# Patient Record
Sex: Male | Born: 1980 | Race: White | Hispanic: No | Marital: Single | Smoking: Never smoker
Health system: Southern US, Community
[De-identification: ages and names within clinical notes are randomized; demographics above are authoritative.]

## PROBLEM LIST (undated history)

## (undated) DIAGNOSIS — I499 Cardiac arrhythmia, unspecified: Secondary | ICD-10-CM

## (undated) DIAGNOSIS — F99 Mental disorder, not otherwise specified: Secondary | ICD-10-CM

## (undated) DIAGNOSIS — F32A Depression, unspecified: Secondary | ICD-10-CM

## (undated) DIAGNOSIS — F319 Bipolar disorder, unspecified: Secondary | ICD-10-CM

## (undated) DIAGNOSIS — F329 Major depressive disorder, single episode, unspecified: Secondary | ICD-10-CM

## (undated) DIAGNOSIS — J45909 Unspecified asthma, uncomplicated: Secondary | ICD-10-CM

## (undated) DIAGNOSIS — I10 Essential (primary) hypertension: Secondary | ICD-10-CM

## (undated) DIAGNOSIS — F419 Anxiety disorder, unspecified: Secondary | ICD-10-CM

## (undated) DIAGNOSIS — G8929 Other chronic pain: Secondary | ICD-10-CM

## (undated) HISTORY — DX: Anxiety disorder, unspecified: F41.9

## (undated) HISTORY — DX: Depression, unspecified: F32.A

## (undated) HISTORY — DX: Other chronic pain: G89.29

---

## 2006-05-02 ENCOUNTER — Emergency Department (HOSPITAL_COMMUNITY): Admission: EM | Admit: 2006-05-02 | Discharge: 2006-05-02 | Payer: Self-pay | Admitting: Emergency Medicine

## 2008-02-20 ENCOUNTER — Emergency Department (HOSPITAL_COMMUNITY): Admission: EM | Admit: 2008-02-20 | Discharge: 2008-02-20 | Payer: Self-pay | Admitting: Family Medicine

## 2010-10-17 ENCOUNTER — Emergency Department (HOSPITAL_COMMUNITY)
Admission: EM | Admit: 2010-10-17 | Discharge: 2010-10-17 | Disposition: A | Discharge status: Home / Self Care | Payer: Self-pay | Attending: Emergency Medicine | Admitting: Emergency Medicine

## 2010-10-17 DIAGNOSIS — M545 Low back pain, unspecified: Secondary | ICD-10-CM | POA: Insufficient documentation

## 2010-10-17 DIAGNOSIS — Y9367 Activity, basketball: Secondary | ICD-10-CM | POA: Insufficient documentation

## 2010-10-17 DIAGNOSIS — X500XXA Overexertion from strenuous movement or load, initial encounter: Secondary | ICD-10-CM | POA: Insufficient documentation

## 2010-10-17 DIAGNOSIS — M25659 Stiffness of unspecified hip, not elsewhere classified: Secondary | ICD-10-CM | POA: Insufficient documentation

## 2010-10-17 DIAGNOSIS — S335XXA Sprain of ligaments of lumbar spine, initial encounter: Secondary | ICD-10-CM | POA: Insufficient documentation

## 2010-10-28 ENCOUNTER — Emergency Department (HOSPITAL_COMMUNITY)
Admission: EM | Admit: 2010-10-28 | Discharge: 2010-10-28 | Disposition: A | Discharge status: Home / Self Care | Payer: Self-pay | Attending: Emergency Medicine | Admitting: Emergency Medicine

## 2010-10-28 ENCOUNTER — Emergency Department (HOSPITAL_COMMUNITY): Payer: Self-pay

## 2010-10-28 DIAGNOSIS — R509 Fever, unspecified: Secondary | ICD-10-CM | POA: Insufficient documentation

## 2010-10-28 DIAGNOSIS — G8929 Other chronic pain: Secondary | ICD-10-CM | POA: Insufficient documentation

## 2010-10-28 DIAGNOSIS — M25539 Pain in unspecified wrist: Secondary | ICD-10-CM | POA: Insufficient documentation

## 2010-10-28 DIAGNOSIS — W19XXXA Unspecified fall, initial encounter: Secondary | ICD-10-CM | POA: Insufficient documentation

## 2010-10-28 DIAGNOSIS — M545 Low back pain, unspecified: Secondary | ICD-10-CM | POA: Insufficient documentation

## 2010-10-28 DIAGNOSIS — IMO0002 Reserved for concepts with insufficient information to code with codable children: Secondary | ICD-10-CM | POA: Insufficient documentation

## 2010-10-30 ENCOUNTER — Emergency Department (HOSPITAL_COMMUNITY)
Admission: EM | Admit: 2010-10-30 | Discharge: 2010-10-31 | Disposition: A | Discharge status: Other Acute Inpatient Hospital | Payer: Self-pay | Attending: Emergency Medicine | Admitting: Emergency Medicine

## 2010-10-30 DIAGNOSIS — F3289 Other specified depressive episodes: Secondary | ICD-10-CM | POA: Insufficient documentation

## 2010-10-30 DIAGNOSIS — F329 Major depressive disorder, single episode, unspecified: Secondary | ICD-10-CM | POA: Insufficient documentation

## 2010-10-30 DIAGNOSIS — F411 Generalized anxiety disorder: Secondary | ICD-10-CM | POA: Insufficient documentation

## 2010-10-31 ENCOUNTER — Emergency Department (HOSPITAL_COMMUNITY)
Admission: EM | Admit: 2010-10-31 | Discharge: 2010-11-01 | Disposition: A | Discharge status: Other Acute Inpatient Hospital | Payer: Self-pay | Attending: Emergency Medicine | Admitting: Emergency Medicine

## 2010-10-31 DIAGNOSIS — F329 Major depressive disorder, single episode, unspecified: Secondary | ICD-10-CM | POA: Insufficient documentation

## 2010-10-31 DIAGNOSIS — F411 Generalized anxiety disorder: Secondary | ICD-10-CM | POA: Insufficient documentation

## 2010-10-31 DIAGNOSIS — F3289 Other specified depressive episodes: Secondary | ICD-10-CM | POA: Insufficient documentation

## 2010-10-31 LAB — CBC
Platelets: 308 10*3/uL (ref 150–400)
RBC: 5.02 MIL/uL (ref 4.22–5.81)
WBC: 11.7 10*3/uL — ABNORMAL HIGH (ref 4.0–10.5)

## 2010-10-31 LAB — COMPREHENSIVE METABOLIC PANEL
AST: 16 U/L (ref 0–37)
Albumin: 4.3 g/dL (ref 3.5–5.2)
Chloride: 105 mEq/L (ref 96–112)
Creatinine, Ser: 0.74 mg/dL (ref 0.4–1.5)
GFR calc Af Amer: >60 mL/min (ref >60)
Potassium: 4 mEq/L (ref 3.5–5.1)
Total Bilirubin: 0.7 mg/dL (ref 0.3–1.2)
Total Protein: 7 g/dL (ref 6.0–8.3)

## 2010-10-31 LAB — RAPID URINE DRUG SCREEN, HOSP PERFORMED
Amphetamines: NOT DETECTED
Benzodiazepines: POSITIVE — AB
Opiates: POSITIVE — AB
Tetrahydrocannabinol: NOT DETECTED

## 2010-10-31 LAB — DIFFERENTIAL
Basophils Relative: 0 % (ref 0–1)
Eosinophils Absolute: 0 10*3/uL (ref 0.0–0.7)
Neutrophils Relative %: 80 % — ABNORMAL HIGH (ref 43–77)

## 2010-11-01 ENCOUNTER — Inpatient Hospital Stay (HOSPITAL_COMMUNITY)
Admission: AD | Admit: 2010-11-01 | Discharge: 2010-11-18 | DRG: 885 | Disposition: A | Discharge status: Home / Self Care | Payer: PRIVATE HEALTH INSURANCE | Source: Ambulatory Visit | Attending: Psychiatry | Admitting: Psychiatry

## 2010-11-01 DIAGNOSIS — R45851 Suicidal ideations: Secondary | ICD-10-CM

## 2010-11-01 DIAGNOSIS — Z733 Stress, not elsewhere classified: Secondary | ICD-10-CM

## 2010-11-01 DIAGNOSIS — X58XXXA Exposure to other specified factors, initial encounter: Secondary | ICD-10-CM

## 2010-11-01 DIAGNOSIS — Z560 Unemployment, unspecified: Secondary | ICD-10-CM

## 2010-11-01 DIAGNOSIS — F331 Major depressive disorder, recurrent, moderate: Secondary | ICD-10-CM

## 2010-11-01 DIAGNOSIS — F329 Major depressive disorder, single episode, unspecified: Secondary | ICD-10-CM

## 2010-11-01 DIAGNOSIS — Z818 Family history of other mental and behavioral disorders: Secondary | ICD-10-CM

## 2010-11-01 DIAGNOSIS — S63509A Unspecified sprain of unspecified wrist, initial encounter: Secondary | ICD-10-CM

## 2010-11-01 DIAGNOSIS — M549 Dorsalgia, unspecified: Secondary | ICD-10-CM

## 2010-11-01 DIAGNOSIS — G8929 Other chronic pain: Secondary | ICD-10-CM

## 2010-11-01 DIAGNOSIS — F411 Generalized anxiety disorder: Secondary | ICD-10-CM

## 2010-11-04 DIAGNOSIS — F339 Major depressive disorder, recurrent, unspecified: Secondary | ICD-10-CM

## 2010-11-09 LAB — DIFFERENTIAL
Basophils Absolute: 0 10*3/uL (ref 0.0–0.1)
Basophils Relative: 0 % (ref 0–1)
Eosinophils Absolute: 0.2 10*3/uL (ref 0.0–0.7)
Eosinophils Relative: 2 % (ref 0–5)
Monocytes Absolute: 0.8 10*3/uL (ref 0.1–1.0)
Neutro Abs: 5.8 10*3/uL (ref 1.7–7.7)

## 2010-11-09 LAB — GLUCOSE, CAPILLARY: Glucose-Capillary: 104 mg/dL — ABNORMAL HIGH (ref 70–99)

## 2010-11-09 LAB — CBC
Hemoglobin: 14.8 g/dL (ref 13.0–17.0)
MCH: 29.7 pg (ref 26.0–34.0)
MCHC: 33.1 g/dL (ref 30.0–36.0)
Platelets: 262 10*3/uL (ref 150–400)
RDW: 12.4 % (ref 11.5–15.5)

## 2010-11-11 LAB — GLUCOSE, CAPILLARY: Glucose-Capillary: 106 mg/dL — ABNORMAL HIGH (ref 70–99)

## 2010-11-12 LAB — GLUCOSE, CAPILLARY
Glucose-Capillary: 84 mg/dL (ref 70–99)
Glucose-Capillary: 83 mg/dL (ref 70–99)

## 2010-11-13 LAB — GLUCOSE, CAPILLARY: Glucose-Capillary: 100 mg/dL — ABNORMAL HIGH (ref 70–99)

## 2010-11-19 ENCOUNTER — Emergency Department (HOSPITAL_COMMUNITY)
Admission: EM | Admit: 2010-11-19 | Discharge: 2010-11-22 | Disposition: A | Discharge status: Other Acute Inpatient Hospital | Payer: Self-pay | Attending: Emergency Medicine | Admitting: Emergency Medicine

## 2010-11-19 DIAGNOSIS — F3112 Bipolar disorder, current episode manic without psychotic features, moderate: Secondary | ICD-10-CM

## 2010-11-19 DIAGNOSIS — R45851 Suicidal ideations: Secondary | ICD-10-CM | POA: Insufficient documentation

## 2010-11-19 LAB — COMPREHENSIVE METABOLIC PANEL
Albumin: 4.2 g/dL (ref 3.5–5.2)
Alkaline Phosphatase: 110 U/L (ref 39–117)
BUN: 16 mg/dL (ref 6–23)
Calcium: 10 mg/dL (ref 8.4–10.5)
GFR calc Af Amer: >60 mL/min (ref >60)
Glucose, Bld: 81 mg/dL (ref 70–99)
Potassium: 3.9 mEq/L (ref 3.5–5.1)
Sodium: 139 mEq/L (ref 135–145)
Total Protein: 7.2 g/dL (ref 6.0–8.3)

## 2010-11-19 LAB — DIFFERENTIAL
Basophils Absolute: 0 10*3/uL (ref 0.0–0.1)
Basophils Relative: 0 % (ref 0–1)
Eosinophils Relative: 3 % (ref 0–5)
Monocytes Absolute: 0.7 10*3/uL (ref 0.1–1.0)
Monocytes Relative: 9 % (ref 3–12)

## 2010-11-19 LAB — CBC
Hemoglobin: 14.7 g/dL (ref 13.0–17.0)
MCH: 28.2 pg (ref 26.0–34.0)
MCHC: 31.7 g/dL (ref 30.0–36.0)
RDW: 12.6 % (ref 11.5–15.5)

## 2010-11-19 LAB — RAPID URINE DRUG SCREEN, HOSP PERFORMED
Barbiturates: NOT DETECTED
Tetrahydrocannabinol: NOT DETECTED

## 2010-11-19 LAB — ETHANOL: Alcohol, Ethyl (B): <11 mg/dL (ref 0–11)

## 2010-11-20 DIAGNOSIS — F3112 Bipolar disorder, current episode manic without psychotic features, moderate: Secondary | ICD-10-CM

## 2010-11-21 DIAGNOSIS — F3112 Bipolar disorder, current episode manic without psychotic features, moderate: Secondary | ICD-10-CM

## 2010-11-22 ENCOUNTER — Inpatient Hospital Stay (HOSPITAL_COMMUNITY)
Admission: AD | Admit: 2010-11-22 | Discharge: 2010-11-29 | DRG: 885 | Disposition: A | Discharge status: Home / Self Care | Payer: PRIVATE HEALTH INSURANCE | Source: Ambulatory Visit | Attending: Psychiatry | Admitting: Psychiatry

## 2010-11-22 DIAGNOSIS — R45851 Suicidal ideations: Secondary | ICD-10-CM

## 2010-11-22 DIAGNOSIS — G8929 Other chronic pain: Secondary | ICD-10-CM

## 2010-11-22 DIAGNOSIS — F333 Major depressive disorder, recurrent, severe with psychotic symptoms: Principal | ICD-10-CM

## 2010-11-22 DIAGNOSIS — Z560 Unemployment, unspecified: Secondary | ICD-10-CM

## 2010-11-22 DIAGNOSIS — M549 Dorsalgia, unspecified: Secondary | ICD-10-CM
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
## 2010-12-04 ENCOUNTER — Emergency Department (HOSPITAL_COMMUNITY)
Admission: EM | Admit: 2010-12-04 | Discharge: 2010-12-04 | Disposition: A | Discharge status: Other Acute Inpatient Hospital | Payer: Self-pay | Attending: Emergency Medicine | Admitting: Emergency Medicine

## 2010-12-04 DIAGNOSIS — F3289 Other specified depressive episodes: Secondary | ICD-10-CM | POA: Insufficient documentation

## 2010-12-04 DIAGNOSIS — R45851 Suicidal ideations: Secondary | ICD-10-CM | POA: Insufficient documentation

## 2010-12-04 DIAGNOSIS — H519 Unspecified disorder of binocular movement: Secondary | ICD-10-CM | POA: Insufficient documentation

## 2010-12-04 DIAGNOSIS — G8929 Other chronic pain: Secondary | ICD-10-CM | POA: Insufficient documentation

## 2010-12-04 DIAGNOSIS — M549 Dorsalgia, unspecified: Secondary | ICD-10-CM | POA: Insufficient documentation

## 2010-12-04 DIAGNOSIS — F329 Major depressive disorder, single episode, unspecified: Secondary | ICD-10-CM | POA: Insufficient documentation

## 2010-12-04 LAB — DIFFERENTIAL
Basophils Absolute: 0 10*3/uL (ref 0.0–0.1)
Eosinophils Relative: 1 % (ref 0–5)
Lymphocytes Relative: 18 % (ref 12–46)
Neutro Abs: 7 10*3/uL (ref 1.7–7.7)
Neutrophils Relative %: 73 % (ref 43–77)

## 2010-12-04 LAB — CBC
HCT: 45.2 % (ref 39.0–52.0)
Hemoglobin: 15.5 g/dL (ref 13.0–17.0)
RBC: 5.14 MIL/uL (ref 4.22–5.81)
RDW: 12.2 % (ref 11.5–15.5)
WBC: 9.6 10*3/uL (ref 4.0–10.5)

## 2010-12-04 LAB — BASIC METABOLIC PANEL
Chloride: 106 mEq/L (ref 96–112)
GFR calc non Af Amer: >60 mL/min (ref >60)
Glucose, Bld: 92 mg/dL (ref 70–99)
Potassium: 4.1 mEq/L (ref 3.5–5.1)
Sodium: 143 mEq/L (ref 135–145)

## 2010-12-04 LAB — RAPID URINE DRUG SCREEN, HOSP PERFORMED
Amphetamines: NOT DETECTED
Barbiturates: NOT DETECTED
Benzodiazepines: NOT DETECTED
Cocaine: NOT DETECTED
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
## 2010-12-11 ENCOUNTER — Emergency Department (HOSPITAL_COMMUNITY)
Admission: EM | Admit: 2010-12-11 | Discharge: 2010-12-15 | Disposition: A | Discharge status: Home / Self Care | Payer: Self-pay | Attending: Emergency Medicine | Admitting: Emergency Medicine

## 2010-12-11 DIAGNOSIS — F3289 Other specified depressive episodes: Secondary | ICD-10-CM | POA: Insufficient documentation

## 2010-12-11 DIAGNOSIS — F411 Generalized anxiety disorder: Secondary | ICD-10-CM | POA: Insufficient documentation

## 2010-12-11 DIAGNOSIS — F329 Major depressive disorder, single episode, unspecified: Secondary | ICD-10-CM | POA: Insufficient documentation

## 2010-12-11 DIAGNOSIS — Z79899 Other long term (current) drug therapy: Secondary | ICD-10-CM | POA: Insufficient documentation

## 2010-12-11 LAB — COMPREHENSIVE METABOLIC PANEL
AST: 21 U/L (ref 0–37)
Albumin: 4 g/dL (ref 3.5–5.2)
Calcium: 9.5 mg/dL (ref 8.4–10.5)
Creatinine, Ser: 0.81 mg/dL (ref 0.50–1.35)
Total Protein: 7.2 g/dL (ref 6.0–8.3)

## 2010-12-11 LAB — DIFFERENTIAL
Eosinophils Absolute: 0.3 10*3/uL (ref 0.0–0.7)
Eosinophils Relative: 3 % (ref 0–5)
Lymphs Abs: 2.1 10*3/uL (ref 0.7–4.0)
Monocytes Relative: 8 % (ref 3–12)

## 2010-12-11 LAB — RAPID URINE DRUG SCREEN, HOSP PERFORMED
Amphetamines: NOT DETECTED
Cocaine: NOT DETECTED
Opiates: NOT DETECTED
Tetrahydrocannabinol: NOT DETECTED

## 2010-12-11 LAB — CBC
MCH: 30.6 pg (ref 26.0–34.0)
MCV: 87.4 fL (ref 78.0–100.0)
Platelets: 270 10*3/uL (ref 150–400)
RDW: 12.3 % (ref 11.5–15.5)
WBC: 8.6 10*3/uL (ref 4.0–10.5)
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
## 2010-12-23 ENCOUNTER — Emergency Department (HOSPITAL_COMMUNITY)
Admission: EM | Admit: 2010-12-23 | Discharge: 2010-12-23 | Disposition: A | Discharge status: Home / Self Care | Payer: Self-pay | Attending: Emergency Medicine | Admitting: Emergency Medicine

## 2010-12-23 ENCOUNTER — Emergency Department (HOSPITAL_COMMUNITY)
Admission: EM | Admit: 2010-12-23 | Discharge: 2011-01-03 | Disposition: A | Discharge status: Home / Self Care | Payer: Self-pay | Attending: Emergency Medicine | Admitting: Emergency Medicine

## 2010-12-23 DIAGNOSIS — F329 Major depressive disorder, single episode, unspecified: Secondary | ICD-10-CM | POA: Insufficient documentation

## 2010-12-23 DIAGNOSIS — S59909A Unspecified injury of unspecified elbow, initial encounter: Secondary | ICD-10-CM | POA: Insufficient documentation

## 2010-12-23 DIAGNOSIS — M25539 Pain in unspecified wrist: Secondary | ICD-10-CM | POA: Insufficient documentation

## 2010-12-23 DIAGNOSIS — F3289 Other specified depressive episodes: Secondary | ICD-10-CM | POA: Insufficient documentation

## 2010-12-23 DIAGNOSIS — W19XXXA Unspecified fall, initial encounter: Secondary | ICD-10-CM | POA: Insufficient documentation

## 2010-12-23 DIAGNOSIS — S59919A Unspecified injury of unspecified forearm, initial encounter: Secondary | ICD-10-CM | POA: Insufficient documentation

## 2010-12-23 DIAGNOSIS — S6990XA Unspecified injury of unspecified wrist, hand and finger(s), initial encounter: Secondary | ICD-10-CM | POA: Insufficient documentation

## 2010-12-23 DIAGNOSIS — R45851 Suicidal ideations: Secondary | ICD-10-CM | POA: Insufficient documentation

## 2010-12-23 DIAGNOSIS — Z79899 Other long term (current) drug therapy: Secondary | ICD-10-CM | POA: Insufficient documentation

## 2010-12-23 DIAGNOSIS — M79609 Pain in unspecified limb: Secondary | ICD-10-CM | POA: Insufficient documentation

## 2010-12-23 LAB — CBC
HCT: 43.3 % (ref 39.0–52.0)
MCH: 29.7 pg (ref 26.0–34.0)
MCHC: 34.2 g/dL (ref 30.0–36.0)
MCHC: 33.3 g/dL (ref 30.0–36.0)
MCV: 89.2 fL (ref 78.0–100.0)
Platelets: 261 10*3/uL (ref 150–400)
RDW: 12.4 % (ref 11.5–15.5)

## 2010-12-23 LAB — BASIC METABOLIC PANEL
CO2: 28 mEq/L (ref 19–32)
GFR calc non Af Amer: >60 mL/min (ref >60)
Glucose, Bld: 89 mg/dL (ref 70–99)
Potassium: 3.8 mEq/L (ref 3.5–5.1)
Sodium: 143 mEq/L (ref 135–145)

## 2010-12-23 LAB — ETHANOL
Alcohol, Ethyl (B): <11 mg/dL (ref 0–11)
Alcohol, Ethyl (B): <11 mg/dL (ref 0–11)

## 2010-12-23 LAB — COMPREHENSIVE METABOLIC PANEL
Albumin: 4.3 g/dL (ref 3.5–5.2)
Alkaline Phosphatase: 112 U/L (ref 39–117)
BUN: 11 mg/dL (ref 6–23)
Calcium: 9.5 mg/dL (ref 8.4–10.5)
Potassium: 3.9 mEq/L (ref 3.5–5.1)
Total Protein: 7.3 g/dL (ref 6.0–8.3)

## 2010-12-23 LAB — DIFFERENTIAL
Basophils Absolute: 0 10*3/uL (ref 0.0–0.1)
Basophils Relative: 0 % (ref 0–1)
Basophils Relative: 0 % (ref 0–1)
Eosinophils Absolute: 0.4 10*3/uL (ref 0.0–0.7)
Eosinophils Relative: 6 % — ABNORMAL HIGH (ref 0–5)
Eosinophils Relative: 5 % (ref 0–5)
Lymphocytes Relative: 26 % (ref 12–46)
Monocytes Absolute: 0.6 10*3/uL (ref 0.1–1.0)
Monocytes Absolute: 0.7 10*3/uL (ref 0.1–1.0)
Monocytes Relative: 8 % (ref 3–12)
Neutrophils Relative %: 57 % (ref 43–77)

## 2010-12-23 LAB — RAPID URINE DRUG SCREEN, HOSP PERFORMED
Amphetamines: NOT DETECTED
Benzodiazepines: NOT DETECTED
Cocaine: NOT DETECTED
Opiates: NOT DETECTED
Opiates: NOT DETECTED

## 2010-12-29 ENCOUNTER — Emergency Department (HOSPITAL_COMMUNITY): Payer: Self-pay

## 2011-01-16 ENCOUNTER — Emergency Department (HOSPITAL_COMMUNITY)
Admission: EM | Admit: 2011-01-16 | Discharge: 2011-01-21 | Disposition: A | Discharge status: Home / Self Care | Payer: Self-pay | Attending: Emergency Medicine | Admitting: Emergency Medicine

## 2011-01-16 DIAGNOSIS — F329 Major depressive disorder, single episode, unspecified: Secondary | ICD-10-CM | POA: Insufficient documentation

## 2011-01-16 DIAGNOSIS — IMO0002 Reserved for concepts with insufficient information to code with codable children: Secondary | ICD-10-CM | POA: Insufficient documentation

## 2011-01-16 DIAGNOSIS — Z79899 Other long term (current) drug therapy: Secondary | ICD-10-CM | POA: Insufficient documentation

## 2011-01-16 DIAGNOSIS — F3289 Other specified depressive episodes: Secondary | ICD-10-CM | POA: Insufficient documentation

## 2011-01-16 DIAGNOSIS — X789XXA Intentional self-harm by unspecified sharp object, initial encounter: Secondary | ICD-10-CM | POA: Insufficient documentation

## 2011-01-16 LAB — DIFFERENTIAL
Basophils Absolute: 0 K/uL (ref 0.0–0.1)
Basophils Relative: 1 % (ref 0–1)
Eosinophils Absolute: 0.3 K/uL (ref 0.0–0.7)
Eosinophils Relative: 5 % (ref 0–5)
Lymphocytes Relative: 25 % (ref 12–46)
Lymphs Abs: 1.4 K/uL (ref 0.7–4.0)
Monocytes Absolute: 0.5 K/uL (ref 0.1–1.0)
Monocytes Relative: 9 % (ref 3–12)
Neutro Abs: 3.5 K/uL (ref 1.7–7.7)
Neutrophils Relative %: 62 % (ref 43–77)

## 2011-01-16 LAB — CBC
HCT: 42.3 % (ref 39.0–52.0)
Hemoglobin: 13.6 g/dL (ref 13.0–17.0)
MCH: 28.6 pg (ref 26.0–34.0)
MCHC: 32.2 g/dL (ref 30.0–36.0)
MCV: 89.1 fL (ref 78.0–100.0)
Platelets: 273 K/uL (ref 150–400)
RBC: 4.75 MIL/uL (ref 4.22–5.81)
RDW: 12.9 % (ref 11.5–15.5)
WBC: 5.7 K/uL (ref 4.0–10.5)

## 2011-01-16 LAB — COMPREHENSIVE METABOLIC PANEL
ALT: 31 U/L (ref 0–53)
AST: 20 U/L (ref 0–37)
Albumin: 4.2 g/dL (ref 3.5–5.2)
Alkaline Phosphatase: 102 U/L (ref 39–117)
Potassium: 4 mEq/L (ref 3.5–5.1)
Sodium: 140 mEq/L (ref 135–145)
Total Protein: 7.4 g/dL (ref 6.0–8.3)

## 2011-01-16 LAB — ETHANOL: Alcohol, Ethyl (B): <11 mg/dL (ref 0–11)

## 2011-01-16 LAB — RAPID URINE DRUG SCREEN, HOSP PERFORMED
Barbiturates: NOT DETECTED
Tetrahydrocannabinol: NOT DETECTED

## 2011-01-16 LAB — ACETAMINOPHEN LEVEL: Acetaminophen (Tylenol), Serum: <15 ug/mL (ref 10–30)

## 2011-02-10 ENCOUNTER — Emergency Department (HOSPITAL_COMMUNITY): Payer: Self-pay

## 2011-02-10 ENCOUNTER — Emergency Department (HOSPITAL_COMMUNITY)
Admission: EM | Admit: 2011-02-10 | Discharge: 2011-02-10 | Disposition: A | Discharge status: Home / Self Care | Payer: Self-pay | Attending: Emergency Medicine | Admitting: Emergency Medicine

## 2011-02-10 ENCOUNTER — Emergency Department (HOSPITAL_COMMUNITY)
Admission: EM | Admit: 2011-02-10 | Discharge: 2011-02-11 | Disposition: A | Discharge status: Home / Self Care | Payer: Self-pay | Attending: Emergency Medicine | Admitting: Emergency Medicine

## 2011-02-10 DIAGNOSIS — M25579 Pain in unspecified ankle and joints of unspecified foot: Secondary | ICD-10-CM | POA: Insufficient documentation

## 2011-02-10 DIAGNOSIS — R079 Chest pain, unspecified: Secondary | ICD-10-CM | POA: Insufficient documentation

## 2011-02-10 DIAGNOSIS — M545 Low back pain, unspecified: Secondary | ICD-10-CM | POA: Insufficient documentation

## 2011-02-10 DIAGNOSIS — M25476 Effusion, unspecified foot: Secondary | ICD-10-CM | POA: Insufficient documentation

## 2011-02-10 DIAGNOSIS — I10 Essential (primary) hypertension: Secondary | ICD-10-CM | POA: Insufficient documentation

## 2011-02-10 DIAGNOSIS — Z79899 Other long term (current) drug therapy: Secondary | ICD-10-CM | POA: Insufficient documentation

## 2011-02-10 DIAGNOSIS — F3289 Other specified depressive episodes: Secondary | ICD-10-CM | POA: Insufficient documentation

## 2011-02-10 DIAGNOSIS — F329 Major depressive disorder, single episode, unspecified: Secondary | ICD-10-CM | POA: Insufficient documentation

## 2011-02-10 DIAGNOSIS — S335XXA Sprain of ligaments of lumbar spine, initial encounter: Secondary | ICD-10-CM | POA: Insufficient documentation

## 2011-02-10 DIAGNOSIS — M25473 Effusion, unspecified ankle: Secondary | ICD-10-CM | POA: Insufficient documentation

## 2011-02-10 DIAGNOSIS — Y92009 Unspecified place in unspecified non-institutional (private) residence as the place of occurrence of the external cause: Secondary | ICD-10-CM | POA: Insufficient documentation

## 2011-02-10 DIAGNOSIS — S93409A Sprain of unspecified ligament of unspecified ankle, initial encounter: Secondary | ICD-10-CM | POA: Insufficient documentation

## 2011-02-10 DIAGNOSIS — F341 Dysthymic disorder: Secondary | ICD-10-CM | POA: Insufficient documentation

## 2011-02-10 DIAGNOSIS — F319 Bipolar disorder, unspecified: Secondary | ICD-10-CM | POA: Insufficient documentation

## 2011-02-10 DIAGNOSIS — W1789XA Other fall from one level to another, initial encounter: Secondary | ICD-10-CM | POA: Insufficient documentation

## 2011-02-10 LAB — DIFFERENTIAL
Basophils Absolute: 0 10*3/uL (ref 0.0–0.1)
Lymphocytes Relative: 21 % (ref 12–46)
Lymphs Abs: 2.3 10*3/uL (ref 0.7–4.0)
Monocytes Absolute: 1.2 10*3/uL — ABNORMAL HIGH (ref 0.1–1.0)
Monocytes Relative: 11 % (ref 3–12)
Neutro Abs: 7.1 10*3/uL (ref 1.7–7.7)

## 2011-02-10 LAB — COMPREHENSIVE METABOLIC PANEL
AST: 18 U/L (ref 0–37)
Albumin: 4.1 g/dL (ref 3.5–5.2)
Calcium: 9.8 mg/dL (ref 8.4–10.5)
Chloride: 104 mEq/L (ref 96–112)
Creatinine, Ser: 0.89 mg/dL (ref 0.50–1.35)
Total Bilirubin: 0.5 mg/dL (ref 0.3–1.2)

## 2011-02-10 LAB — SALICYLATE LEVEL: Salicylate Lvl: 3 mg/dL (ref 2.8–20.0)

## 2011-02-10 LAB — CBC
HCT: 41.7 % (ref 39.0–52.0)
Hemoglobin: 14 g/dL (ref 13.0–17.0)
MCHC: 33.6 g/dL (ref 30.0–36.0)

## 2011-02-10 LAB — RAPID URINE DRUG SCREEN, HOSP PERFORMED
Barbiturates: NOT DETECTED
Cocaine: NOT DETECTED
Tetrahydrocannabinol: NOT DETECTED

## 2011-02-10 LAB — ACETAMINOPHEN LEVEL: Acetaminophen (Tylenol), Serum: <15 ug/mL (ref 10–30)

## 2011-02-11 ENCOUNTER — Emergency Department (HOSPITAL_COMMUNITY): Payer: Self-pay

## 2011-02-11 ENCOUNTER — Emergency Department (HOSPITAL_COMMUNITY)
Admission: EM | Admit: 2011-02-11 | Discharge: 2011-02-11 | Disposition: A | Discharge status: Home / Self Care | Payer: Self-pay | Attending: Emergency Medicine | Admitting: Emergency Medicine

## 2011-02-11 DIAGNOSIS — F3112 Bipolar disorder, current episode manic without psychotic features, moderate: Secondary | ICD-10-CM

## 2011-02-11 DIAGNOSIS — F319 Bipolar disorder, unspecified: Secondary | ICD-10-CM | POA: Insufficient documentation

## 2011-02-11 DIAGNOSIS — R42 Dizziness and giddiness: Secondary | ICD-10-CM | POA: Insufficient documentation

## 2011-02-11 DIAGNOSIS — K5900 Constipation, unspecified: Secondary | ICD-10-CM | POA: Insufficient documentation

## 2011-02-11 DIAGNOSIS — Z79899 Other long term (current) drug therapy: Secondary | ICD-10-CM | POA: Insufficient documentation

## 2011-02-11 DIAGNOSIS — R1013 Epigastric pain: Secondary | ICD-10-CM | POA: Insufficient documentation

## 2011-02-11 DIAGNOSIS — R112 Nausea with vomiting, unspecified: Secondary | ICD-10-CM | POA: Insufficient documentation

## 2011-02-13 NOTE — Consult Note (Signed)
  [name] [name], [name] [name]           ACCOUNT NO.:  [id number]  MEDICAL RECORD NO.:  [id number]  LOCATION:  [facility]                         FACILITY:  [facility]  PHYSICIAN:  [name], MD DATE OF BIRTH:  [birth year]  DATE OF CONSULTATION: DATE OF DISCHARGE:  02/11/2011                                CONSULTATION   FACILITY:  [name].  HISTORY OF PRESENT ILLNESS:  I saw the patient, and reviewed the clinical assessment and the medical records.  A 30 year old male who is well-known to me, has a number of hospitalization at [facility] and in the ER for the mood disorder.  The patient came to the [facility] ED as he was not able to sleep properly, and took Seroquel 500 instead of 150 mg in an effort to sleep and after that he became sick and came to the ER.  Currently, the patient denies suicidal or homicidal ideations, not hallucinating or delusional, not internally preoccupied, is very easily redirectable, pleasant on approach.  Cognition alert, awake, oriented x3.  Memory immediate, recent, remote, fair.  Attention and concentration good.  Abstraction ability, good.  Insight and judgment intact.  SOCIAL HISTORY:  No substance abuse reported by the patient.  His UDS is negative.  MEDICATIONS:  The patient is currently on: 1. Neurontin 400 t.i.d. 2. Hydroxyzine 50 at bedtime. 3. Seroquel 50 in the morning, 150 at bedtime. 4. Cymbalta 60 mg p.o. daily. The patient feels comfortable with this regime and is going to follow up in [place].  The patient lives with mother.  The patient at this time wants to follow up in the outpatient setting and does not want to be admitted in the hospital.  DIAGNOSES:  AXIS I:  Mood disorder, not otherwise specified.  AXIS II: Deferred.  AXIS III:  Chronic back pain.  AXIS IV:  Chronic mental health issues.  AXIS V:  60.  RECOMMENDATIONS:  The patient can be discharged at this time to follow up in the outpatient setting at [facility].  The  patient does not meet criteria to be admitted under IVC in the hospital.  As the patient wants to discharge and his mental status is within normal limits, and he is motivated to follow up and remain compliant with his medications, the patient can be discharged at this time.     [name], MD     SA/MEDQ  D:  02/11/2011  T:  02/11/2011  Job:  [id number]  Electronically Signed by [name]  on 02/13/2011 01:05:12 PM

## 2011-02-21 ENCOUNTER — Emergency Department (HOSPITAL_COMMUNITY)
Admission: EM | Admit: 2011-02-21 | Discharge: 2011-02-21 | Disposition: A | Discharge status: Home / Self Care | Payer: Self-pay | Attending: Emergency Medicine | Admitting: Emergency Medicine

## 2011-02-21 ENCOUNTER — Emergency Department (HOSPITAL_COMMUNITY)
Admission: EM | Admit: 2011-02-21 | Discharge: 2011-02-22 | Disposition: A | Discharge status: Home / Self Care | Payer: Self-pay | Attending: Emergency Medicine | Admitting: Emergency Medicine

## 2011-02-21 DIAGNOSIS — R42 Dizziness and giddiness: Secondary | ICD-10-CM | POA: Insufficient documentation

## 2011-02-21 DIAGNOSIS — R112 Nausea with vomiting, unspecified: Secondary | ICD-10-CM | POA: Insufficient documentation

## 2011-02-21 DIAGNOSIS — L298 Other pruritus: Secondary | ICD-10-CM | POA: Insufficient documentation

## 2011-02-21 DIAGNOSIS — F329 Major depressive disorder, single episode, unspecified: Secondary | ICD-10-CM | POA: Insufficient documentation

## 2011-02-21 DIAGNOSIS — R21 Rash and other nonspecific skin eruption: Secondary | ICD-10-CM | POA: Insufficient documentation

## 2011-02-21 DIAGNOSIS — R51 Headache: Secondary | ICD-10-CM | POA: Insufficient documentation

## 2011-02-21 DIAGNOSIS — F3289 Other specified depressive episodes: Secondary | ICD-10-CM | POA: Insufficient documentation

## 2011-02-21 DIAGNOSIS — L2989 Other pruritus: Secondary | ICD-10-CM | POA: Insufficient documentation

## 2011-02-21 DIAGNOSIS — Z79899 Other long term (current) drug therapy: Secondary | ICD-10-CM | POA: Insufficient documentation

## 2011-02-21 DIAGNOSIS — F3130 Bipolar disorder, current episode depressed, mild or moderate severity, unspecified: Secondary | ICD-10-CM | POA: Insufficient documentation

## 2011-02-21 DIAGNOSIS — M79609 Pain in unspecified limb: Secondary | ICD-10-CM | POA: Insufficient documentation

## 2011-02-21 DIAGNOSIS — Z760 Encounter for issue of repeat prescription: Secondary | ICD-10-CM | POA: Insufficient documentation

## 2011-02-21 LAB — CBC
MCH: 30.3 pg (ref 26.0–34.0)
MCV: 89.4 fL (ref 78.0–100.0)
Platelets: 291 10*3/uL (ref 150–400)
RDW: 12.9 % (ref 11.5–15.5)

## 2011-02-21 LAB — COMPREHENSIVE METABOLIC PANEL
AST: 22 U/L (ref 0–37)
Albumin: 3.9 g/dL (ref 3.5–5.2)
CO2: 26 mEq/L (ref 19–32)
Calcium: 9.6 mg/dL (ref 8.4–10.5)
Creatinine, Ser: 0.72 mg/dL (ref 0.50–1.35)
GFR calc non Af Amer: >60 mL/min (ref >60)

## 2011-02-21 LAB — DIFFERENTIAL
Eosinophils Absolute: 0.3 10*3/uL (ref 0.0–0.7)
Eosinophils Relative: 4 % (ref 0–5)
Lymphs Abs: 1.9 10*3/uL (ref 0.7–4.0)
Monocytes Absolute: 0.7 10*3/uL (ref 0.1–1.0)
Monocytes Relative: 9 % (ref 3–12)

## 2011-02-21 LAB — ETHANOL: Alcohol, Ethyl (B): <11 mg/dL (ref 0–11)

## 2011-02-21 LAB — RAPID URINE DRUG SCREEN, HOSP PERFORMED
Benzodiazepines: NOT DETECTED
Cocaine: NOT DETECTED

## 2011-03-06 ENCOUNTER — Emergency Department (HOSPITAL_COMMUNITY)
Admission: EM | Admit: 2011-03-06 | Discharge: 2011-03-06 | Disposition: A | Discharge status: Home / Self Care | Payer: Self-pay | Attending: Emergency Medicine | Admitting: Emergency Medicine

## 2011-03-06 ENCOUNTER — Emergency Department (HOSPITAL_COMMUNITY): Payer: Self-pay

## 2011-03-06 DIAGNOSIS — M25539 Pain in unspecified wrist: Secondary | ICD-10-CM | POA: Insufficient documentation

## 2011-03-06 DIAGNOSIS — M25639 Stiffness of unspecified wrist, not elsewhere classified: Secondary | ICD-10-CM | POA: Insufficient documentation

## 2011-03-06 DIAGNOSIS — F3130 Bipolar disorder, current episode depressed, mild or moderate severity, unspecified: Secondary | ICD-10-CM | POA: Insufficient documentation

## 2011-03-06 DIAGNOSIS — Z79899 Other long term (current) drug therapy: Secondary | ICD-10-CM | POA: Insufficient documentation

## 2011-03-11 ENCOUNTER — Emergency Department (HOSPITAL_COMMUNITY)
Admission: EM | Admit: 2011-03-11 | Discharge: 2011-03-11 | Disposition: A | Discharge status: Home / Self Care | Payer: Self-pay | Attending: Emergency Medicine | Admitting: Emergency Medicine

## 2011-03-11 DIAGNOSIS — F319 Bipolar disorder, unspecified: Secondary | ICD-10-CM | POA: Insufficient documentation

## 2011-03-11 DIAGNOSIS — R42 Dizziness and giddiness: Secondary | ICD-10-CM | POA: Insufficient documentation

## 2011-03-11 DIAGNOSIS — R112 Nausea with vomiting, unspecified: Secondary | ICD-10-CM | POA: Insufficient documentation

## 2011-05-22 ENCOUNTER — Emergency Department (HOSPITAL_COMMUNITY)
Admission: EM | Admit: 2011-05-22 | Discharge: 2011-05-24 | Disposition: A | Discharge status: Home / Self Care | Payer: Self-pay | Attending: Emergency Medicine | Admitting: Emergency Medicine

## 2011-05-22 DIAGNOSIS — R45851 Suicidal ideations: Secondary | ICD-10-CM | POA: Insufficient documentation

## 2011-05-22 DIAGNOSIS — F1020 Alcohol dependence, uncomplicated: Secondary | ICD-10-CM | POA: Insufficient documentation

## 2011-05-22 DIAGNOSIS — F329 Major depressive disorder, single episode, unspecified: Secondary | ICD-10-CM

## 2011-05-22 DIAGNOSIS — K2920 Alcoholic gastritis without bleeding: Secondary | ICD-10-CM | POA: Insufficient documentation

## 2011-05-22 DIAGNOSIS — F3289 Other specified depressive episodes: Secondary | ICD-10-CM | POA: Insufficient documentation

## 2011-05-22 HISTORY — DX: Mental disorder, not otherwise specified: F99

## 2011-05-22 LAB — CBC
MCHC: 33.2 g/dL (ref 30.0–36.0)
Platelets: 273 10*3/uL (ref 150–400)
RDW: 12.5 % (ref 11.5–15.5)

## 2011-05-22 LAB — RAPID URINE DRUG SCREEN, HOSP PERFORMED
Amphetamines: NOT DETECTED
Barbiturates: NOT DETECTED
Benzodiazepines: NOT DETECTED

## 2011-05-22 LAB — COMPREHENSIVE METABOLIC PANEL
ALT: 15 U/L (ref 0–53)
Albumin: 3.9 g/dL (ref 3.5–5.2)
Alkaline Phosphatase: 100 U/L (ref 39–117)
Potassium: 3.6 mEq/L (ref 3.5–5.1)
Sodium: 140 mEq/L (ref 135–145)
Total Protein: 7.3 g/dL (ref 6.0–8.3)

## 2011-05-22 LAB — ACETAMINOPHEN LEVEL: Acetaminophen (Tylenol), Serum: <15 ug/mL (ref 10–30)

## 2011-05-22 LAB — ETHANOL: Alcohol, Ethyl (B): <11 mg/dL (ref 0–11)

## 2011-05-22 MED ORDER — GABAPENTIN 600 MG PO TABS
300.0000 mg | ORAL_TABLET | Freq: Three times a day (TID) | ORAL | Status: DC
  Administered 2011-05-22 (×3): 300 mg via ORAL
  Filled 2011-05-22 (×6): qty 0.5

## 2011-05-22 MED ORDER — ACETAMINOPHEN 325 MG PO TABS: 650.0000 mg | ORAL_TABLET | ORAL | Status: DC | PRN

## 2011-05-22 MED ORDER — LORAZEPAM 1 MG PO TABS: 1.0000 mg | ORAL_TABLET | Freq: Three times a day (TID) | ORAL | Status: DC | PRN

## 2011-05-22 MED ORDER — QUETIAPINE FUMARATE 100 MG PO TABS
100.0000 mg | ORAL_TABLET | Freq: Every day | ORAL | Status: DC
  Administered 2011-05-22 – 2011-05-23 (×2): 100 mg via ORAL
  Filled 2011-05-22 (×3): qty 1

## 2011-05-22 MED ORDER — ALUM & MAG HYDROXIDE-SIMETH 200-200-20 MG/5ML PO SUSP: 30.0000 mL | ORAL | Status: DC | PRN

## 2011-05-22 MED ORDER — ONDANSETRON HCL 8 MG PO TABS: 4.0000 mg | ORAL_TABLET | Freq: Three times a day (TID) | ORAL | Status: DC | PRN

## 2011-05-22 MED ORDER — FAMOTIDINE 20 MG PO TABS
20.0000 mg | ORAL_TABLET | Freq: Every day | ORAL | Status: DC
  Administered 2011-05-22 – 2011-05-23 (×2): 20 mg via ORAL
  Filled 2011-05-22 (×2): qty 1

## 2011-05-22 MED ORDER — NICOTINE 21 MG/24HR TD PT24
21.0000 mg | MEDICATED_PATCH | Freq: Every day | TRANSDERMAL | Status: DC
  Administered 2011-05-22: 21 mg via TRANSDERMAL
  Filled 2011-05-22 (×2): qty 1

## 2011-05-22 MED ORDER — ZOLPIDEM TARTRATE 5 MG PO TABS: 5.0000 mg | ORAL_TABLET | Freq: Every evening | ORAL | Status: DC | PRN

## 2011-05-22 MED ORDER — ONDANSETRON 4 MG PO TBDP
4.0000 mg | ORAL_TABLET | Freq: Once | ORAL | Status: AC
Start: 2011-05-22 — End: 2011-05-22
  Administered 2011-05-22: 4 mg via ORAL
  Filled 2011-05-22: qty 1

## 2011-05-22 MED ORDER — DULOXETINE HCL 60 MG PO CPEP
60.0000 mg | ORAL_CAPSULE | Freq: Every day | ORAL | Status: DC
  Administered 2011-05-22 – 2011-05-23 (×2): 60 mg via ORAL
  Filled 2011-05-22 (×4): qty 1

## 2011-05-22 MED ORDER — IBUPROFEN 200 MG PO TABS: 600.0000 mg | ORAL_TABLET | Freq: Three times a day (TID) | ORAL | Status: DC | PRN

## 2011-05-22 MED ORDER — QUETIAPINE FUMARATE 50 MG PO TABS
50.0000 mg | ORAL_TABLET | Freq: Every day | ORAL | Status: DC
  Administered 2011-05-22 – 2011-05-23 (×2): 50 mg via ORAL
  Filled 2011-05-22 (×5): qty 1

## 2011-05-22 NOTE — ED Notes (Signed)
Valuables inventoried and locked in locker #3 by myself and [place], NT.

## 2011-05-22 NOTE — ED Notes (Signed)
Patient has asitter ?

## 2011-05-22 NOTE — BH Assessment (Signed)
Assessment Note   [name] [name] is an 30 y.o. male that was referred by his sister due to pt expressing SI.  Pt continues to endorse SI (no plan) since the death of his mother at the end of last month by report.  Pt stated he has had increasing depression and SI for the last three weeks.  Pt stated he drank a half of a fifth of liquor last night because he was depressed and sad.  Pt denies regular use of any alcohol or drugs except for last night.  Pt has had several admissions in the past in 2012 at [facility] and [facility] for depression and SI.  Pt denies HI or psychosis.  Pt currently attends [facility] for med management and reports he attends appts and takes his meds as prescribed.  Pt stated he needs additional help and is unable to contract for safety at this time.  Consulted with EDP Hyman Hopes who agreed that IP treatment is warranted.  Pt is voluntary and motivated for treatment.  Completed assessment as well as assessment notification and faxed to [facility] to run for possible admission.  Axis I: Major Depression, Recurrent severe without psychotic features Axis II: Deferred Axis III:  Past Medical History  Diagnosis Date  . Mental disorder    Axis IV: other psychosocial or environmental problems and problems with primary support group, bereavement Axis V: 21-30 behavior considerably influenced by delusions or hallucinations OR serious impairment in judgment, communication OR inability to function in almost all areas  Past Medical History:  Past Medical History  Diagnosis Date  . Mental disorder     History reviewed. No pertinent past surgical history.  Family History: History reviewed. No pertinent family history.  Social History:  does not have a smoking history on file. He does not have any smokeless tobacco history on file. He reports that he does not use illicit drugs. His alcohol history not on file.  Additional Social History:  Alcohol / Drug Use Pain Medications: none Prescriptions: see  list Over the Counter: none History of alcohol / drug use?: Yes Substance #1 Name of Substance 1: ETOH 1 - Age of First Use: 16 1 - Amount (size/oz): one half of a fifth of liquor 1 - Frequency: once 1 - Duration: once 1 - Last Use / Amount: yesterday Allergies: No Known Allergies  Home Medications:  Medications Prior to Admission  Medication Dose Route Frequency Provider Last Rate Last Dose  . acetaminophen (TYLENOL) tablet 650 mg  650 mg Oral Q4H PRN Forbes Cellar, MD      . alum & mag hydroxide-simeth (MAALOX/MYLANTA) 200-200-20 MG/5ML suspension 30 mL  30 mL Oral PRN Forbes Cellar, MD      . DULoxetine (CYMBALTA) DR capsule 60 mg  60 mg Oral Daily Forbes Cellar, MD   60 mg at 05/22/11 1236  . famotidine (PEPCID) tablet 20 mg  20 mg Oral Daily Forbes Cellar, MD   20 mg at 05/22/11 1227  . gabapentin (NEURONTIN) tablet 300 mg  300 mg Oral TID Forbes Cellar, MD   300 mg at 05/22/11 1237  . ibuprofen (ADVIL,MOTRIN) tablet 600 mg  600 mg Oral Q8H PRN Forbes Cellar, MD      . LORazepam (ATIVAN) tablet 1 mg  1 mg Oral Q8H PRN Forbes Cellar, MD      . nicotine (NICODERM CQ - dosed in mg/24 hours) patch 21 mg  21 mg Transdermal Daily Forbes Cellar, MD   21 mg at 05/22/11 1227  .  ondansetron (ZOFRAN) tablet 4 mg  4 mg Oral Q8H PRN Forbes Cellar, MD      . ondansetron (ZOFRAN-ODT) disintegrating tablet 4 mg  4 mg Oral Once Forbes Cellar, MD   4 mg at 05/22/11 1226  . QUEtiapine (SEROQUEL) tablet 100 mg  100 mg Oral QHS Forbes Cellar, MD      . QUEtiapine (SEROQUEL) tablet 50 mg  50 mg Oral Daily Forbes Cellar, MD   50 mg at 05/22/11 1236  . zolpidem (AMBIEN) tablet 5 mg  5 mg Oral QHS PRN Forbes Cellar, MD       No current outpatient prescriptions on file as of 05/22/2011.    OB/GYN Status:  No LMP for male patient.  General Assessment Data Location of Assessment: [facility] ED ACT Assessment: Yes Living Arrangements: Family members (lives with sister and brother-in-law) Can pt  return to current living arrangement?: Yes Admission Status: Voluntary Is patient capable of signing voluntary admission?: Yes Transfer from: Acute Hospital Referral Source: Self/Family/Friend  Education Status Is patient currently in school?: No  Risk to self Suicidal Ideation: Yes-Currently Present Suicidal Intent: Yes-Currently Present Is patient at risk for suicide?: Yes Suicidal Plan?: No-Not Currently/Within Last 6 Months Access to Means: No What has been your use of drugs/alcohol within the last 12 months?: Reported he drank once yesterday because he was upset Previous Attempts/Gestures: Yes (gestures in past) How many times?:  (several) Other Self Harm Risks:  (denies) Triggers for Past Attempts: Other (Comment) (depression, relational issues) Intentional Self Injurious Behavior: None Family Suicide History: No Recent stressful life event(s): Loss (Comment) (mother passed away at end of last month) Persecutory voices/beliefs?: No Depression: Yes Depression Symptoms: Despondent;Insomnia;Tearfulness;Isolating;Loss of interest in usual pleasures;Feeling worthless/self pity Substance abuse history and/or treatment for substance abuse?: No Suicide prevention information given to non-admitted patients: Not applicable  Risk to Others Homicidal Ideation: No-Not Currently/Within Last 6 Months Thoughts of Harm to Others: No-Not Currently Present/Within Last 6 Months Current Homicidal Intent: No-Not Currently/Within Last 6 Months Current Homicidal Plan: No-Not Currently/Within Last 6 Months Access to Homicidal Means: No Identified Victim:  (n/a) History of harm to others?: No Assessment of Violence: None Noted Violent Behavior Description: n/a - pt calm, cooperative Does patient have access to weapons?: No Criminal Charges Pending?: No Does patient have a court date: No  Psychosis Hallucinations: None noted Delusions: None noted  Mental Status Report Appear/Hygiene:  Disheveled Eye Contact: Good Motor Activity: Unremarkable Speech: Logical/coherent Level of Consciousness: Alert Mood: Depressed Affect: Sullen;Depressed Anxiety Level: Minimal Thought Processes: Coherent;Relevant Judgement: Impaired Orientation: Person;Place;Time;Situation Obsessive Compulsive Thoughts/Behaviors: None  Cognitive Functioning Concentration: Decreased Memory: Recent Intact;Remote Intact IQ: Average Insight: Poor Impulse Control: Fair Appetite: Poor (reports has not had much of an appetite) Weight Loss: 0  Weight Gain: 0  Sleep: Decreased Total Hours of Sleep: 5  Vegetative Symptoms: None  Prior Inpatient Therapy Prior Inpatient Therapy: Yes Prior Therapy Dates: 2012 (several admits) Prior Therapy Facilty/Provider(s): [facility], [facility] Reason for Treatment: Depression/SI  Prior Outpatient Therapy Prior Outpatient Therapy: Yes Prior Therapy Dates: 2012 Prior Therapy Facilty/Provider(s): [facility] Reason for Treatment: Depression  ADL Screening (condition at time of admission) Patient's cognitive ability adequate to safely complete daily activities?: Yes Patient able to express need for assistance with ADLs?: Yes Independently performs ADLs?: Yes       Abuse/Neglect Assessment (Assessment to be complete while patient is alone) Physical Abuse: Denies Verbal Abuse: Denies Sexual Abuse: Denies Exploitation of patient/patient's resources: Denies Self-Neglect: Denies Values / Beliefs Cultural  Requests During Hospitalization: None Spiritual Requests During Hospitalization: None Consults Spiritual Care Consult Needed: No Social Work Consult Needed: No Merchant navy officer (For Healthcare) Advance Directive: Patient does not have advance directive;Patient would not like information    Additional Information 1:1 In Past 12 Months?: No CIRT Risk: No Elopement Risk: No Does patient have medical clearance?: Yes     Disposition:   Disposition Disposition of Patient: Referred to;Inpatient treatment program Type of inpatient treatment program: Adult Patient referred to: Other (Comment) (Referral sent to [facility] to consider for admission)  On Site Evaluation by:  Hyman Hopes Reviewed with Physician:  Aleda Grana, Rennis Harding 05/22/2011 2:58 PM

## 2011-05-22 NOTE — ED Notes (Signed)
Pt.s  Mother died  2 days after Thanksgiving.  Pt. Was doing well and in the last week he has been depressed, unable to get out of bed, vomitng.

## 2011-05-22 NOTE — ED Provider Notes (Signed)
History     CSN: [id number] Arrival date & time: 06-07-2011  9:52 AM   First MD Initiated Contact with Patient 06/07/2011 1107      Chief Complaint  Patient presents with  . Depression     HPI  30yo male history of depression presents with depression, suicidal ideation. Patient states that his mother died shortly after Thanksgiving. He states that for the past few days he is sinking into worsening depression. He states that yesterday he had all to one harm himself. There was no attempt. He did not have a plan. He does have a history of suicide attempt in the past by overdose. He states he's been taking his medications including his Seroquel, Cymbalta, Wellbutrin as described. He states that he's been drinking daily and not eating. He has mild epigastric pain which she rates as a 2/10 discomfort at this time. Multiple episodes of nonbilious nonbloody emesis. He denies fever, chills, constipation, diarrhea.   ED Notes, ED Provider Notes from 06/07/2011 0000 to 2011/06/07 09:57:54       Kathe Becton, RN 2011/06/07 09:57      Pt.s Mother died 2 days after Thanksgiving. Pt. Was doing well and in the last week he has been depressed, unable to get out of bed, vomitng.      No past medical history on file.  No past surgical history on file.  No family history on file.  History  Substance Use Topics  . Smoking status: Not on file  . Smokeless tobacco: Not on file  . Alcohol Use: Not on file      Review of Systems  All other systems reviewed and are negative.   except as noted HPI  Allergies  Review of patient's allergies indicates no known allergies.  Home Medications   Current Outpatient Rx  Name Route Sig Dispense Refill  . BUPROPION HCL ER (XL) 150 MG PO TB24 Oral Take 150 mg by mouth every morning.      . DULOXETINE HCL 60 MG PO CPEP Oral Take 60 mg by mouth every morning.      Marland Kitchen GABAPENTIN 300 MG PO CAPS Oral Take 300 mg by mouth 3 (three) times daily.      Marland Kitchen  HYDROXYZINE PAMOATE 50 MG PO CAPS Oral Take 50 mg by mouth 2 (two) times daily as needed. For anxiety     . QUETIAPINE FUMARATE 100 MG PO TABS Oral Take 50-100 mg by mouth at bedtime. 50 mg in the morning ,100 mg in the evening       BP 135/75  Pulse 81  Temp(Src) 98.5 F (36.9 C) (Oral)  Resp 18  Ht 6\' 1"  (1.854 m)  Wt 290 lb (131.543 kg)  BMI 38.26 kg/m2  SpO2 99%  Physical Exam  Nursing note and vitals reviewed. Constitutional: He is oriented to person, place, and time. He appears well-developed and well-nourished. No distress.  HENT:  Head: Atraumatic.  Mouth/Throat: Oropharynx is clear and moist.  Eyes: Conjunctivae are normal. Pupils are equal, round, and reactive to light.  Neck: Neck supple.  Cardiovascular: Normal rate, regular rhythm, normal heart sounds and intact distal pulses.  Exam reveals no gallop and no friction rub.   No murmur heard. Pulmonary/Chest: Effort normal. No respiratory distress. He has no wheezes. He has no rales.  Abdominal: Soft. Bowel sounds are normal. There is tenderness. There is no rebound and no guarding.       min epigastric tenderness to palpation  Musculoskeletal: Normal range  of motion. He exhibits no edema and no tenderness.  Neurological: He is alert and oriented to person, place, and time.  Skin: Skin is warm and dry.  Psychiatric:       Flat affect    ED Course  Procedures (including critical care time)   Labs Reviewed  URINE RAPID DRUG SCREEN (HOSP PERFORMED)  ETHANOL  CBC  COMPREHENSIVE METABOLIC PANEL  ACETAMINOPHEN LEVEL  SALICYLATE LEVEL   No results found.   1. Depression   2. Suicidal ideation   3. Alcoholic gastritis      MDM  30yoM h/o depression,  with worsening depression likely triggered by grief 2/2 mother's death.  +SI.  Gastritis 2/2 etoh use (daily x several days without eating). Pepcid, zofran. ACT team to eval. Patient is medically cleared.  D/W ACT team. Attempting placement at [facility]. Holding  orders placed.  [name] Cellar, MD 05/22/11 1432

## 2011-05-22 NOTE — ED Notes (Signed)
Attempted to scan neurontin at bedside - scanner did not recognize medication. Gabapentin 300 mg po was given.

## 2011-05-22 NOTE — ED Notes (Signed)
Read order for seroquel on MAR - looks as if dosage for 50 mg was given earlier - order for once a day at 50 mg - then 100 mg at bedtime.  Will give 100 mg at bedtime per MAR.

## 2011-05-23 ENCOUNTER — Inpatient Hospital Stay (HOSPITAL_COMMUNITY): Admission: AD | Admit: 2011-05-23 | Payer: Self-pay | Source: Ambulatory Visit | Admitting: Psychiatry

## 2011-05-23 MED ORDER — GABAPENTIN 300 MG PO CAPS
300.0000 mg | ORAL_CAPSULE | Freq: Three times a day (TID) | ORAL | Status: DC
  Administered 2011-05-23 (×3): 300 mg via ORAL
  Filled 2011-05-23 (×5): qty 1

## 2011-05-23 NOTE — ED Notes (Signed)
ACT in room. 

## 2011-05-23 NOTE — BH Assessment (Signed)
Assessment Note   [name] [name] is an 30 y.o. male.  [name] [name] had informed previous clinician that he did not want to kill himself anymore.  Telepsych was ordered and completed.  Dr. Debbora Presto, with telepsych, recommended discharge with follow up by current provider Vesta Mixer).  This clinician did talk to [name] [name] and he confirmed that he had no plan or intention to harm himself or anyone else.  He is willing to contract for safety and has done so.  This clinician talked to [facility] [name] physician who agreed to discharge [name] [name].  Nurse will assist with arranging transportation to home.  [name] [name] is going to go to [place] tomorrow to confirm his next appointment.  He also confirmed that he has medications.  Axis I: Major Depression, Recurrent severe Axis II: Deferred Axis III:  Past Medical History  Diagnosis Date  . Mental disorder    Axis IV: problems related to social environment Axis V: 51-60 moderate symptoms  Past Medical History:  Past Medical History  Diagnosis Date  . Mental disorder     History reviewed. No pertinent past surgical history.  Family History: History reviewed. No pertinent family history.  Social History:  does not have a smoking history on file. He does not have any smokeless tobacco history on file. He reports that he does not use illicit drugs. His alcohol history not on file.  Additional Social History:  Alcohol / Drug Use Pain Medications: none Prescriptions: see list Over the Counter: none History of alcohol / drug use?: Yes Substance #1 Name of Substance 1: ETOH 1 - Age of First Use: 16 1 - Amount (size/oz): one half of a fifth of liquor 1 - Frequency: once 1 - Duration: once 1 - Last Use / Amount: yesterday Allergies: No Known Allergies  Home Medications:  Medications Prior to Admission  Medication Dose Route Frequency Provider Last Rate Last Dose  . acetaminophen (TYLENOL) tablet 650 mg  650 mg Oral Q4H PRN Forbes Cellar, MD       . alum & mag hydroxide-simeth (MAALOX/MYLANTA) 200-200-20 MG/5ML suspension 30 mL  30 mL Oral PRN Forbes Cellar, MD      . DULoxetine (CYMBALTA) DR capsule 60 mg  60 mg Oral Daily Forbes Cellar, MD   60 mg at 05/23/11 1128  . famotidine (PEPCID) tablet 20 mg  20 mg Oral Daily Forbes Cellar, MD   20 mg at 05/23/11 1037  . gabapentin (NEURONTIN) capsule 300 mg  300 mg Oral TID Haskel Schroeder Seay, PHARMD   300 mg at 05/23/11 2158  . ibuprofen (ADVIL,MOTRIN) tablet 600 mg  600 mg Oral Q8H PRN Forbes Cellar, MD      . LORazepam (ATIVAN) tablet 1 mg  1 mg Oral Q8H PRN Forbes Cellar, MD      . nicotine (NICODERM CQ - dosed in mg/24 hours) patch 21 mg  21 mg Transdermal Daily Forbes Cellar, MD   21 mg at 05/22/11 1227  . ondansetron (ZOFRAN) tablet 4 mg  4 mg Oral Q8H PRN Forbes Cellar, MD      . ondansetron (ZOFRAN-ODT) disintegrating tablet 4 mg  4 mg Oral Once Forbes Cellar, MD   4 mg at 05/22/11 1226  . QUEtiapine (SEROQUEL) tablet 100 mg  100 mg Oral QHS Forbes Cellar, MD   100 mg at 05/23/11 2158  . QUEtiapine (SEROQUEL) tablet 50 mg  50 mg Oral Daily Forbes Cellar, MD   50 mg at 05/23/11 1037  . zolpidem (AMBIEN) tablet 5 mg  5 mg Oral QHS PRN Forbes Cellar, MD      . DISCONTD: gabapentin (NEURONTIN) tablet 300 mg  300 mg Oral TID Forbes Cellar, MD   300 mg at 05/22/11 2237   No current outpatient prescriptions on file as of 05/22/2011.    OB/GYN Status:  No LMP for male patient.  General Assessment Data Location of Assessment: [facility] ED ACT Assessment: Yes Living Arrangements: Family members (lives with sister and brother-in-law) Can pt return to current living arrangement?: Yes Admission Status: Voluntary Is patient capable of signing voluntary admission?: Yes Transfer from: Acute [name] Referral Source: Self/Family/Friend  Education Status Is patient currently in school?: No  Risk to self Suicidal Ideation: Yes-Currently Present Suicidal Intent: Yes-Currently  Present Is patient at risk for suicide?: Yes Suicidal Plan?: No-Not Currently/Within Last 6 Months Specify Current Suicidal Plan:  (n/a) Access to Means: No What has been your use of drugs/alcohol within the last 12 months?: Reported drank alcohol once on 12/16 Previous Attempts/Gestures: Yes (gestures in past) How many times?:  (several) Other Self Harm Risks:  (denies) Triggers for Past Attempts: Other (Comment) (depression, relational issues) Intentional Self Injurious Behavior: None Family Suicide History: No Recent stressful life event(s): Loss (Comment) (mother passed away at end of last month) Persecutory voices/beliefs?: No Depression: Yes Depression Symptoms: Despondent;Insomnia;Tearfulness;Isolating;Loss of interest in usual pleasures;Feeling worthless/self pity Substance abuse history and/or treatment for substance abuse?: Yes (alcohol in past) Suicide prevention information given to non-admitted patients: Not applicable  Risk to Others Homicidal Ideation: No-Not Currently/Within Last 6 Months Thoughts of Harm to Others: No-Not Currently Present/Within Last 6 Months Current Homicidal Intent: No-Not Currently/Within Last 6 Months Current Homicidal Plan: No-Not Currently/Within Last 6 Months Access to Homicidal Means: No Identified Victim:  (n/a) History of harm to others?: No Assessment of Violence: None Noted Violent Behavior Description: n/a - pt calm and cooperative Does patient have access to weapons?: No Criminal Charges Pending?: No Does patient have a court date: No  Psychosis Hallucinations: None noted Delusions: None noted  Mental Status Report Appear/Hygiene: Disheveled Eye Contact: Good Motor Activity: Unremarkable Speech: Logical/coherent Level of Consciousness: Alert Mood: Depressed Affect: Sullen;Depressed Anxiety Level: Minimal Thought Processes: Coherent;Relevant Judgement: Impaired Orientation: Person;Place;Time;Situation Obsessive  Compulsive Thoughts/Behaviors: None  Cognitive Functioning Concentration: Decreased Memory: Recent Intact;Remote Intact IQ: Average Insight: Poor Impulse Control: Fair Appetite: Poor (reports has not had much of an appetite) Weight Loss: 0  Weight Gain: 0  Sleep: Decreased Total Hours of Sleep: 5  Vegetative Symptoms: None  Prior Inpatient Therapy Prior Inpatient Therapy: Yes Prior Therapy Dates: 2012 Prior Therapy Facilty/Provider(s): [facility] Reason for Treatment: Depression/SI  Prior Outpatient Therapy Prior Outpatient Therapy: Yes Prior Therapy Dates: 2012 Prior Therapy Facilty/Provider(s): [facility] Reason for Treatment: Depression  ADL Screening (condition at time of admission) Patient's cognitive ability adequate to safely complete daily activities?: Yes Patient able to express need for assistance with ADLs?: Yes Independently performs ADLs?: Yes       Abuse/Neglect Assessment (Assessment to be complete while patient is alone) Physical Abuse: Denies Verbal Abuse: Denies Sexual Abuse: Denies Exploitation of patient/patient's resources: Denies Self-Neglect: Denies Values / Beliefs Cultural Requests During Hospitalization: None Spiritual Requests During Hospitalization: None Consults Spiritual Care Consult Needed: No Social Work Consult Needed: No Merchant navy officer (For Healthcare) Advance Directive: Patient does not have advance directive;Patient would not like information    Additional Information 1:1 In Past 12 Months?: No CIRT Risk: No Elopement Risk: No Does patient have medical clearance?: Yes  Disposition:  Disposition Disposition of Patient: Referred to;Inpatient treatment program Type of inpatient treatment program: Adult Patient referred to: Other (Comment) [facility])  On Site Evaluation by:   Reviewed with Physician:     Alexandria Lodge 05/23/2011 11:25 PM

## 2011-05-23 NOTE — ED Notes (Signed)
First meeting with patient. Patient sleeping with NAD at this time. Sitter in the room.

## 2011-05-23 NOTE — ED Notes (Signed)
Patient resting with NAD at this time. Sitter in room.

## 2011-05-23 NOTE — ED Notes (Signed)
Patient watching TV with NAD at this time. Sitter in room. 

## 2011-05-23 NOTE — ED Notes (Signed)
Patient advised of bening admitted to [facility].

## 2011-05-23 NOTE — ED Notes (Signed)
Patient ate breakfast and is watching TV with NAD at this time. Sitter in room.

## 2011-05-23 NOTE — ED Notes (Signed)
Ordered dinner tray; non sharps 

## 2011-05-23 NOTE — ED Notes (Signed)
ACT team states that pt has been declined from [facility], she will find further placement. Pts belongings placed back in [place] [facility]. Pt with sitter at bedside. Pt is aware in plan of care. No needs.

## 2011-05-23 NOTE — ED Notes (Signed)
Patient resting and watching TV with NAD. Sitter in the room.

## 2011-05-23 NOTE — BH Assessment (Signed)
Assessment Note   [name] [name] is an 30 y.o. male.  Pt reassessed this day, as pt not accepted to [facility] as per assessment office.  Pt was declined by NP Lynann Bologna, as pt's stay at [facility] was 14 days last time as well as chronic SI.  Updated ED staff.  Pt continues to endorse SI, no plan.  Pt denies HI or psychosis.  Pt denies SA, stated he drank on 12/16 because he was upset.  No other change in clinical information or history at this time.  Called [facility], no beds per Claris Che, no beds at [facility] per Zella Ball.  Called [facility] and bed possible available;  Writer told to fax over referral.  Completed reassessment and faxed to [facility] for review.  Completed assessment notification and faxed to [facility] to log.  Updated ED staff.    Axis I: Major Depression, Recurrent severe without psychotic features Axis II: Deferred Axis III:  Past Medical History  Diagnosis Date  . Mental disorder    Axis IV: other psychosocial or environmental problems and problems with primary support group Axis V: 21-30 behavior considerably influenced by delusions or hallucinations OR serious impairment in judgment, communication OR inability to function in almost all areas  Past Medical History:  Past Medical History  Diagnosis Date  . Mental disorder     History reviewed. No pertinent past surgical history.  Family History: History reviewed. No pertinent family history.  Social History:  does not have a smoking history on file. He does not have any smokeless tobacco history on file. He reports that he does not use illicit drugs. His alcohol history not on file.  Additional Social History:  Alcohol / Drug Use Pain Medications: none Prescriptions: see list Over the Counter: none History of alcohol / drug use?: Yes Substance #1 Name of Substance 1: ETOH 1 - Age of First Use: 16 1 - Amount (size/oz): one half of a fifth of liquor 1 - Frequency: once 1 - Duration: once 1 - Last Use / Amount: yesterday Allergies: No  Known Allergies  Home Medications:  Medications Prior to Admission  Medication Dose Route Frequency Provider Last Rate Last Dose  . acetaminophen (TYLENOL) tablet 650 mg  650 mg Oral Q4H PRN Forbes Cellar, MD      . alum & mag hydroxide-simeth (MAALOX/MYLANTA) 200-200-20 MG/5ML suspension 30 mL  30 mL Oral PRN Forbes Cellar, MD      . DULoxetine (CYMBALTA) DR capsule 60 mg  60 mg Oral Daily Forbes Cellar, MD   60 mg at 05/23/11 1128  . famotidine (PEPCID) tablet 20 mg  20 mg Oral Daily Forbes Cellar, MD   20 mg at 05/23/11 1037  . gabapentin (NEURONTIN) capsule 300 mg  300 mg Oral TID Lora Poteet Seay, PHARMD   300 mg at 05/23/11 1037  . ibuprofen (ADVIL,MOTRIN) tablet 600 mg  600 mg Oral Q8H PRN Forbes Cellar, MD      . LORazepam (ATIVAN) tablet 1 mg  1 mg Oral Q8H PRN Forbes Cellar, MD      . nicotine (NICODERM CQ - dosed in mg/24 hours) patch 21 mg  21 mg Transdermal Daily Forbes Cellar, MD   21 mg at 05/22/11 1227  . ondansetron (ZOFRAN) tablet 4 mg  4 mg Oral Q8H PRN Forbes Cellar, MD      . ondansetron (ZOFRAN-ODT) disintegrating tablet 4 mg  4 mg Oral Once Forbes Cellar, MD   4 mg at 05/22/11 1226  . QUEtiapine (SEROQUEL) tablet  100 mg  100 mg Oral QHS Forbes Cellar, MD   100 mg at 05/22/11 2238  . QUEtiapine (SEROQUEL) tablet 50 mg  50 mg Oral Daily Forbes Cellar, MD   50 mg at 05/23/11 1037  . zolpidem (AMBIEN) tablet 5 mg  5 mg Oral QHS PRN Forbes Cellar, MD      . DISCONTD: gabapentin (NEURONTIN) tablet 300 mg  300 mg Oral TID Forbes Cellar, MD   300 mg at 05/22/11 2237   No current outpatient prescriptions on file as of 05/22/2011.    OB/GYN Status:  No LMP for male patient.  General Assessment Data Location of Assessment: [facility] ACT Assessment: Yes Living Arrangements: Family members (lives with sister and brother-in-law) Can pt return to current living arrangement?: Yes Admission Status: Voluntary Is patient capable of signing voluntary admission?:  Yes Transfer from: Acute Hospital Referral Source: Self/Family/Friend  Education Status Is patient currently in school?: No  Risk to self Suicidal Ideation: Yes-Currently Present Suicidal Intent: Yes-Currently Present Is patient at risk for suicide?: Yes Suicidal Plan?: No-Not Currently/Within Last 6 Months Specify Current Suicidal Plan:  (n/a) Access to Means: No What has been your use of drugs/alcohol within the last 12 months?: Reported drank alcohol once on 12/16 Previous Attempts/Gestures: Yes (gestures in past) How many times?:  (several) Other Self Harm Risks:  (denies) Triggers for Past Attempts: Other (Comment) (depression, relational issues) Intentional Self Injurious Behavior: None Family Suicide History: No Recent stressful life event(s): Loss (Comment) (mother passed away at end of last month) Persecutory voices/beliefs?: No Depression: Yes Depression Symptoms: Despondent;Insomnia;Tearfulness;Isolating;Loss of interest in usual pleasures;Feeling worthless/self pity Substance abuse history and/or treatment for substance abuse?: Yes (alcohol in past) Suicide prevention information given to non-admitted patients: Not applicable  Risk to Others Homicidal Ideation: No-Not Currently/Within Last 6 Months Thoughts of Harm to Others: No-Not Currently Present/Within Last 6 Months Current Homicidal Intent: No-Not Currently/Within Last 6 Months Current Homicidal Plan: No-Not Currently/Within Last 6 Months Access to Homicidal Means: No Identified Victim:  (n/a) History of harm to others?: No Assessment of Violence: None Noted Violent Behavior Description: n/a - pt calm and cooperative Does patient have access to weapons?: No Criminal Charges Pending?: No Does patient have a court date: No  Psychosis Hallucinations: None noted Delusions: None noted  Mental Status Report Appear/Hygiene: Disheveled Eye Contact: Good Motor Activity: Unremarkable Speech:  Logical/coherent Level of Consciousness: Alert Mood: Depressed Affect: Sullen;Depressed Anxiety Level: Minimal Thought Processes: Coherent;Relevant Judgement: Impaired Orientation: Person;Place;Time;Situation Obsessive Compulsive Thoughts/Behaviors: None  Cognitive Functioning Concentration: Decreased Memory: Recent Intact;Remote Intact IQ: Average Insight: Poor Impulse Control: Fair Appetite: Poor (reports has not had much of an appetite) Weight Loss: 0  Weight Gain: 0  Sleep: Decreased Total Hours of Sleep: 5  Vegetative Symptoms: None  Prior Inpatient Therapy Prior Inpatient Therapy: Yes Prior Therapy Dates: 2012 Prior Therapy Facilty/Provider(s): [facility] Reason for Treatment: Depression/SI  Prior Outpatient Therapy Prior Outpatient Therapy: Yes Prior Therapy Dates: 2012 Prior Therapy Facilty/Provider(s): [facility] Reason for Treatment: Depression  ADL Screening (condition at time of admission) Patient's cognitive ability adequate to safely complete daily activities?: Yes Patient able to express need for assistance with ADLs?: Yes Independently performs ADLs?: Yes       Abuse/Neglect Assessment (Assessment to be complete while patient is alone) Physical Abuse: Denies Verbal Abuse: Denies Sexual Abuse: Denies Exploitation of patient/patient's resources: Denies Self-Neglect: Denies Values / Beliefs Cultural Requests During Hospitalization: None Spiritual Requests During Hospitalization: None Consults Spiritual Care Consult Needed: No  Social Work Consult Needed: No Merchant navy officer (For Healthcare) Advance Directive: Patient does not have advance directive;Patient would not like information    Additional Information 1:1 In Past 12 Months?: No CIRT Risk: No Elopement Risk: No Does patient have medical clearance?: Yes     Disposition:  Disposition Disposition of Patient: Referred to;Inpatient treatment program Type of inpatient treatment  program: Adult Patient referred to: Other (Comment) [facility])  On Site Evaluation by:   Reviewed with Physician:     Caryl Comes 05/23/2011 3:27 PM

## 2011-05-23 NOTE — ED Provider Notes (Signed)
Patient accepted to behavioral health by Dr. Allena Katz.    Nat Christen, MD 05/23/11 0900

## 2011-05-23 NOTE — ED Notes (Signed)
Patient resting and watching TV with NAD. Sitter in room.

## 2011-05-23 NOTE — ED Notes (Signed)
Patient resting and watching TV with NAD at this time. Sitter in room. 

## 2011-05-23 NOTE — ED Notes (Signed)
Patient resting with NAD at this time. Sitter in room. 

## 2011-05-23 NOTE — ED Notes (Signed)
Patient resting/sleeping, respond when name called. NAD at this time. Sitter in room.

## 2011-05-23 NOTE — BH Assessment (Signed)
Assessment Note   [name] [name] is an 30 y.o. male that presented to [facility] with SI.  Received call from [facility] stating [facility] by PA Verne Spurr to Dr. Allena Katz to bed 401-1.  Updated assessment disposition, completed support paperwork and completed assessment notification that was sent to [facility] to log.  Updated EDP Hosmer and ED staff.  ED nurse to arrange transport by security to [facility], as pt is voluntary.  Axis I: Major Depression, Recurrent severe Axis II: Deferred Axis III:  Past Medical History  Diagnosis Date  . Mental disorder    Axis IV: other psychosocial or environmental problems and problems with primary support group Axis V: 21-30 behavior considerably influenced by delusions or hallucinations OR serious impairment in judgment, communication OR inability to function in almost all areas  Past Medical History:  Past Medical History  Diagnosis Date  . Mental disorder     History reviewed. No pertinent past surgical history.  Family History: History reviewed. No pertinent family history.  Social History:  does not have a smoking history on file. He does not have any smokeless tobacco history on file. He reports that he does not use illicit drugs. His alcohol history not on file.  Additional Social History:  Alcohol / Drug Use Pain Medications: none Prescriptions: see list Over the Counter: none History of alcohol / drug use?: Yes Substance #1 Name of Substance 1: ETOH 1 - Age of First Use: 16 1 - Amount (size/oz): one half of a fifth of liquor 1 - Frequency: once 1 - Duration: once 1 - Last Use / Amount: yesterday Allergies: No Known Allergies  Home Medications:  Medications Prior to Admission  Medication Dose Route Frequency Provider Last Rate Last Dose  . acetaminophen (TYLENOL) tablet 650 mg  650 mg Oral Q4H PRN Forbes Cellar, MD      . alum & mag hydroxide-simeth (MAALOX/MYLANTA) 200-200-20 MG/5ML suspension 30 mL  30 mL Oral PRN Forbes Cellar, MD      . DULoxetine (CYMBALTA) DR capsule 60 mg  60 mg Oral Daily Forbes Cellar, MD   60 mg at 05/22/11 1236  . famotidine (PEPCID) tablet 20 mg  20 mg Oral Daily Forbes Cellar, MD   20 mg at 05/22/11 1227  . gabapentin (NEURONTIN) tablet 300 mg  300 mg Oral TID Forbes Cellar, MD   300 mg at 05/22/11 2237  . ibuprofen (ADVIL,MOTRIN) tablet 600 mg  600 mg Oral Q8H PRN Forbes Cellar, MD      . LORazepam (ATIVAN) tablet 1 mg  1 mg Oral Q8H PRN Forbes Cellar, MD      . nicotine (NICODERM CQ - dosed in mg/24 hours) patch 21 mg  21 mg Transdermal Daily Forbes Cellar, MD   21 mg at 05/22/11 1227  . ondansetron (ZOFRAN) tablet 4 mg  4 mg Oral Q8H PRN Forbes Cellar, MD      . ondansetron (ZOFRAN-ODT) disintegrating tablet 4 mg  4 mg Oral Once Forbes Cellar, MD   4 mg at 05/22/11 1226  . QUEtiapine (SEROQUEL) tablet 100 mg  100 mg Oral QHS Forbes Cellar, MD   100 mg at 05/22/11 2238  . QUEtiapine (SEROQUEL) tablet 50 mg  50 mg Oral Daily Forbes Cellar, MD   50 mg at 05/22/11 1236  . zolpidem (AMBIEN) tablet 5 mg  5 mg Oral QHS PRN Forbes Cellar, MD       No current outpatient prescriptions on file as of 05/22/2011.  OB/GYN Status:  No LMP for male patient.  General Assessment Data Location of Assessment: [facility] ED ACT Assessment: Yes Living Arrangements: Family members (lives with sister and brother-in-law) Can pt return to current living arrangement?: Yes Admission Status: Voluntary Is patient capable of signing voluntary admission?: Yes Transfer from: Acute Hospital Referral Source: Self/Family/Friend  Education Status Is patient currently in school?: No  Risk to self Suicidal Ideation: Yes-Currently Present Suicidal Intent: Yes-Currently Present Is patient at risk for suicide?: Yes Suicidal Plan?: No-Not Currently/Within Last 6 Months Access to Means: No What has been your use of drugs/alcohol within the last 12 months?: Reported he drank once yesterday because he was  upset Previous Attempts/Gestures: Yes (gestures in past) How many times?:  (several) Other Self Harm Risks:  (denies) Triggers for Past Attempts: Other (Comment) (depression, relational issues) Intentional Self Injurious Behavior: None Family Suicide History: No Recent stressful life event(s): Loss (Comment) (mother passed away at end of last month) Persecutory voices/beliefs?: No Depression: Yes Depression Symptoms: Despondent;Insomnia;Tearfulness;Isolating;Loss of interest in usual pleasures;Feeling worthless/self pity Substance abuse history and/or treatment for substance abuse?: No Suicide prevention information given to non-admitted patients: Not applicable  Risk to Others Homicidal Ideation: No-Not Currently/Within Last 6 Months Thoughts of Harm to Others: No-Not Currently Present/Within Last 6 Months Current Homicidal Intent: No-Not Currently/Within Last 6 Months Current Homicidal Plan: No-Not Currently/Within Last 6 Months Access to Homicidal Means: No Identified Victim:  (n/a) History of harm to others?: No Assessment of Violence: None Noted Violent Behavior Description: n/a - pt calm, cooperative Does patient have access to weapons?: No Criminal Charges Pending?: No Does patient have a court date: No  Psychosis Hallucinations: None noted Delusions: None noted  Mental Status Report Appear/Hygiene: Disheveled Eye Contact: Good Motor Activity: Unremarkable Speech: Logical/coherent Level of Consciousness: Alert Mood: Depressed Affect: Sullen;Depressed Anxiety Level: Minimal Thought Processes: Coherent;Relevant Judgement: Impaired Orientation: Person;Place;Time;Situation Obsessive Compulsive Thoughts/Behaviors: None  Cognitive Functioning Concentration: Decreased Memory: Recent Intact;Remote Intact IQ: Average Insight: Poor Impulse Control: Fair Appetite: Poor (reports has not had much of an appetite) Weight Loss: 0  Weight Gain: 0  Sleep: Decreased Total  Hours of Sleep: 5  Vegetative Symptoms: None  Prior Inpatient Therapy Prior Inpatient Therapy: Yes Prior Therapy Dates: 2012 (several admits) Prior Therapy Facilty/Provider(s): [facility] Reason for Treatment: Depression/SI  Prior Outpatient Therapy Prior Outpatient Therapy: Yes Prior Therapy Dates: 2012 Prior Therapy Facilty/Provider(s): [facility] Reason for Treatment: Depression  ADL Screening (condition at time of admission) Patient's cognitive ability adequate to safely complete daily activities?: Yes Patient able to express need for assistance with ADLs?: Yes Independently performs ADLs?: Yes       Abuse/Neglect Assessment (Assessment to be complete while patient is alone) Physical Abuse: Denies Verbal Abuse: Denies Sexual Abuse: Denies Exploitation of patient/patient's resources: Denies Self-Neglect: Denies Values / Beliefs Cultural Requests During Hospitalization: None Spiritual Requests During Hospitalization: None Consults Spiritual Care Consult Needed: No Social Work Consult Needed: No Merchant navy officer (For Healthcare) Advance Directive: Patient does not have advance directive;Patient would not like information    Additional Information 1:1 In Past 12 Months?: No CIRT Risk: No Elopement Risk: No Does patient have medical clearance?: Yes     Disposition:  Disposition Disposition of Patient: Referred to;Inpatient treatment program Type of inpatient treatment program: Adult Patient referred to: Other (Comment) (Pt accepted [facility])  On Site Evaluation by:   Reviewed with Physician:  Samson Frederic 05/23/2011 8:48 AM

## 2011-05-23 NOTE — ED Notes (Signed)
Patient given snack. Sitter in room.

## 2011-05-23 NOTE — ED Notes (Addendum)
Report called to [facility] to [facility], RN and was told they could not take the patient until 11am or later. Patient advised that he is still being admitted to [facility] but it would be a little while due to number of admission BH has this morning. ACT team advised of delay.

## 2011-05-23 NOTE — ED Provider Notes (Signed)
History     CSN: [id number] Arrival date & time: 05/22/2011  9:52 AM   First MD Initiated Contact with Patient 05/22/11 1107      Chief Complaint  Patient presents with  . Depression    (Consider location/radiation/quality/duration/timing/severity/associated sxs/prior treatment) HPI  Past Medical History  Diagnosis Date  . Mental disorder     History reviewed. No pertinent past surgical history.  History reviewed. No pertinent family history.  History  Substance Use Topics  . Smoking status: Not on file  . Smokeless tobacco: Not on file  . Alcohol Use:      Reported drank alcohol only once yesterday      Review of Systems  Allergies  Review of patient's allergies indicates no known allergies.  Home Medications   Current Outpatient Rx  Name Route Sig Dispense Refill  . BUPROPION HCL ER (XL) 150 MG PO TB24 Oral Take 150 mg by mouth every morning.      . DULOXETINE HCL 60 MG PO CPEP Oral Take 60 mg by mouth every morning.      Marland Kitchen GABAPENTIN 300 MG PO CAPS Oral Take 300 mg by mouth 3 (three) times daily.      Marland Kitchen HYDROXYZINE PAMOATE 50 MG PO CAPS Oral Take 50 mg by mouth 2 (two) times daily as needed. For anxiety     . QUETIAPINE FUMARATE 100 MG PO TABS Oral Take 50-100 mg by mouth at bedtime. 50 mg in the morning ,100 mg in the evening       BP 104/62  Pulse 74  Temp(Src) 98.3 F (36.8 C) (Oral)  Resp 16  Ht 6\' 1"  (1.854 m)  Wt 290 lb (131.543 kg)  BMI 38.26 kg/m2  SpO2 98%  Physical Exam  ED Course  Procedures (including critical care time)  Labs Reviewed  SALICYLATE LEVEL - Abnormal; Notable for the following:    Salicylate Lvl <2.0 (*)    All other components within normal limits  URINE RAPID DRUG SCREEN (HOSP PERFORMED)  ETHANOL  CBC  COMPREHENSIVE METABOLIC PANEL  ACETAMINOPHEN LEVEL   No results found.   1. Depression   2. Suicidal ideation   3. Alcoholic gastritis       MDM          Nat Christen, MD 05/25/11 1024

## 2011-05-23 NOTE — ED Notes (Signed)
Meal tray ordered 

## 2011-06-04 ENCOUNTER — Other Ambulatory Visit: Payer: Self-pay

## 2011-06-04 ENCOUNTER — Encounter (HOSPITAL_COMMUNITY): Payer: Self-pay | Admitting: *Deleted

## 2011-06-04 ENCOUNTER — Emergency Department (HOSPITAL_COMMUNITY): Payer: Self-pay

## 2011-06-04 ENCOUNTER — Emergency Department (HOSPITAL_COMMUNITY)
Admission: EM | Admit: 2011-06-04 | Discharge: 2011-06-04 | Disposition: A | Discharge status: Home / Self Care | Payer: Self-pay | Attending: Emergency Medicine | Admitting: Emergency Medicine

## 2011-06-04 DIAGNOSIS — R079 Chest pain, unspecified: Secondary | ICD-10-CM | POA: Insufficient documentation

## 2011-06-04 DIAGNOSIS — R059 Cough, unspecified: Secondary | ICD-10-CM | POA: Insufficient documentation

## 2011-06-04 DIAGNOSIS — I10 Essential (primary) hypertension: Secondary | ICD-10-CM | POA: Insufficient documentation

## 2011-06-04 DIAGNOSIS — R05 Cough: Secondary | ICD-10-CM | POA: Insufficient documentation

## 2011-06-04 DIAGNOSIS — R112 Nausea with vomiting, unspecified: Secondary | ICD-10-CM | POA: Insufficient documentation

## 2011-06-04 HISTORY — DX: Essential (primary) hypertension: I10

## 2011-06-04 MED ORDER — KETOROLAC TROMETHAMINE 60 MG/2ML IM SOLN
60.0000 mg | Freq: Once | INTRAMUSCULAR | Status: AC
  Administered 2011-06-04: 60 mg via INTRAMUSCULAR
  Filled 2011-06-04: qty 2

## 2011-06-04 MED ORDER — ONDANSETRON 8 MG PO TBDP
8.0000 mg | ORAL_TABLET | Freq: Once | ORAL | Status: AC
  Administered 2011-06-04: 8 mg via ORAL
  Filled 2011-06-04: qty 1

## 2011-06-04 MED ORDER — NAPROXEN 500 MG PO TABS: 500.0000 mg | ORAL_TABLET | Freq: Two times a day (BID) | ORAL | Status: DC

## 2011-06-04 MED ORDER — PROMETHAZINE HCL 25 MG PO TABS: 25.0000 mg | ORAL_TABLET | Freq: Four times a day (QID) | ORAL | Status: DC | PRN

## 2011-06-04 NOTE — ED Notes (Signed)
Patient transported to X-ray 

## 2011-06-04 NOTE — ED Notes (Signed)
ZOX:[id number]<AV> Expected date:06/04/11<BR> Expected time: 2:25 AM<BR> Means of arrival:Ambulance<BR> Comments:<BR> CP

## 2011-06-04 NOTE — ED Notes (Signed)
Patient states his chest pain has returned. Indicates midsternal chest pain. Rates pain "7".

## 2011-06-04 NOTE — ED Provider Notes (Signed)
History     CSN: [id number]  Arrival date & time 06/04/11  0236   First MD Initiated Contact with Patient 06/04/11 0254      Chief Complaint  Patient presents with  . Chest Pain    per EMS pt c/o sickness n/v/d, cough, that began 2 days ago, states pain is reproducible in chest. C/o increased pain with cough, and deep breath.   . Emesis    reports n/v since yesterday.     (Consider location/radiation/quality/duration/timing/severity/associated sxs/prior treatment) HPI Comments: 30 year old male with a history of bipolar disorder and high blood pressure who presents with a complaint of 2 days of nausea and vomiting. He states this started out gradually, persistent, gradually getting worse and is now associated with a cough and chest pain. He states that he only feels the chest pain when he coughs or vomits. He denies fevers, back pain, stomach pain, diarrhea, dysuria, swelling, rashes. He has taken no medication prior to arrival. He denies sick contacts the symptoms are mild at this time  Patient is a 30 y.o. male presenting with chest pain and vomiting. The history is provided by the patient and a friend.  Chest Pain Primary symptoms include vomiting.    Emesis     Past Medical History  Diagnosis Date  . Mental disorder   . Hypertension     History reviewed. No pertinent past surgical history.  History reviewed. No pertinent family history.  History  Substance Use Topics  . Smoking status: Former Smoker -- 0.5 packs/day  . Smokeless tobacco: Not on file  . Alcohol Use:      Reported drank alcohol only once yesterday      Review of Systems  Cardiovascular: Positive for chest pain.  Gastrointestinal: Positive for vomiting.  All other systems reviewed and are negative.    Allergies  Review of patient's allergies indicates no known allergies.  Home Medications   Current Outpatient Rx  Name Route Sig Dispense Refill  . BUPROPION HCL ER (XL) 150 MG PO TB24  Oral Take 150 mg by mouth every morning.      . DULOXETINE HCL 60 MG PO CPEP Oral Take 60 mg by mouth every morning.      Marland Kitchen GABAPENTIN 300 MG PO CAPS Oral Take 300 mg by mouth 3 (three) times daily.      Marland Kitchen HYDROXYZINE PAMOATE 50 MG PO CAPS Oral Take 50 mg by mouth 2 (two) times daily as needed. For anxiety     . QUETIAPINE FUMARATE 100 MG PO TABS Oral Take 50-100 mg by mouth at bedtime. 50 mg in the morning ,100 mg in the evening     . NAPROXEN 500 MG PO TABS Oral Take 1 tablet (500 mg total) by mouth 2 (two) times daily with a meal. 30 tablet 0  . PROMETHAZINE HCL 25 MG PO TABS Oral Take 1 tablet (25 mg total) by mouth every 6 (six) hours as needed for nausea. 12 tablet 0    BP 118/64  Pulse 81  Temp(Src) 98.5 F (36.9 C) (Oral)  Resp 20  SpO2 97%  Physical Exam  Nursing note and vitals reviewed. Constitutional: He appears well-developed and well-nourished. No distress.  HENT:  Head: Normocephalic and atraumatic.  Mouth/Throat: Oropharynx is clear and moist. No oropharyngeal exudate.  Eyes: Conjunctivae and EOM are normal. Pupils are equal, round, and reactive to light. Right eye exhibits no discharge. Left eye exhibits no discharge. No scleral icterus.  Neck: Normal range of motion.  Neck supple. No JVD present. No thyromegaly present.  Cardiovascular: Normal rate, regular rhythm, normal heart sounds and intact distal pulses.  Exam reveals no gallop and no friction rub.   No murmur heard. Pulmonary/Chest: Effort normal and breath sounds normal. No respiratory distress. He has no wheezes. He has no rales.  Abdominal: Soft. Bowel sounds are normal. He exhibits no distension and no mass. There is no tenderness.  Musculoskeletal: Normal range of motion. He exhibits no edema and no tenderness.  Lymphadenopathy:    He has no cervical adenopathy.  Neurological: He is alert. Coordination normal.  Skin: Skin is warm and dry. No rash noted. No erythema.  Psychiatric: He has a normal mood and  affect. His behavior is normal.    ED Course  Procedures (including critical care time)  Labs Reviewed - No data to display Dg Chest 2 View  06/04/2011  *RADIOLOGY REPORT*  Clinical Data: Cough and mid chest pain.  Previous smoker.  CHEST - 2 VIEW  Comparison: None.  Findings: Shallow inspiration. The heart size and pulmonary vascularity are normal. The lungs appear clear and expanded without focal air space disease or consolidation. No blunting of the costophrenic angles.  Anterior compression of a lower thoracic and upper lumbar vertebrae of nonspecific age but probably present on a previous abdominal film from 02/11/2011.  IMPRESSION: No evidence of active pulmonary disease.  Original Report Authenticated By: Marlon Pel, M.D.     1. Cough   2. Chest pain   3. Nausea and vomiting       MDM  Patient is well appearing with normal vital signs and a normal EKG. I suspect that his symptoms are related either to a pneumonia or to a viral syndrome such as a flulike illness. Will get a chest x-ray to rule out pneumonia, intramuscular Toradol and Zofran for nausea. He does appear to be well hydrated based on his mucous membranes and the fact that he is still making clear-colored urine per his report.  ED ECG REPORT   Date: 06/04/2011   Rate: 92  Rhythm: normal sinus rhythm  QRS Axis: normal  Intervals: normal  ST/T Wave abnormalities: normal  Conduction Disutrbances:none  Narrative Interpretation:   Old EKG Reviewed: Compared with 02/10/2011 there are no acute changes seen, left ventricular hypertrophy has resolved.  CXR negative for acute disease, VS reassuring, pain improved with medicines - rx given for home.   Vida Roller, MD 06/04/11 [phone]

## 2011-06-09 ENCOUNTER — Encounter (HOSPITAL_COMMUNITY): Payer: Self-pay | Admitting: *Deleted

## 2011-06-09 ENCOUNTER — Emergency Department (HOSPITAL_COMMUNITY)
Admission: EM | Admit: 2011-06-09 | Discharge: 2011-06-09 | Discharge status: Against Medical Advice | Payer: Self-pay | Attending: Emergency Medicine | Admitting: Emergency Medicine

## 2011-06-09 DIAGNOSIS — Z0389 Encounter for observation for other suspected diseases and conditions ruled out: Secondary | ICD-10-CM | POA: Insufficient documentation

## 2011-06-09 NOTE — ED Notes (Signed)
Pt called to come to treatment area,. No answer

## 2011-06-09 NOTE — ED Notes (Signed)
Pt called x1 in triage and waiting room, no answer

## 2011-06-09 NOTE — ED Notes (Signed)
Reports falling several days ago, now having lower back pain that radiates down bilateral legs. Unable to sleep or work. Ambulatory at triage.

## 2011-06-09 NOTE — ED Notes (Signed)
This patient was called 3times no response.

## 2011-08-15 ENCOUNTER — Emergency Department (HOSPITAL_COMMUNITY)
Admission: EM | Admit: 2011-08-15 | Discharge: 2011-08-16 | Disposition: A | Discharge status: Home / Self Care | Payer: Self-pay | Attending: Emergency Medicine | Admitting: Emergency Medicine

## 2011-08-15 ENCOUNTER — Encounter (HOSPITAL_COMMUNITY): Payer: Self-pay

## 2011-08-15 DIAGNOSIS — R45851 Suicidal ideations: Secondary | ICD-10-CM | POA: Insufficient documentation

## 2011-08-15 DIAGNOSIS — F329 Major depressive disorder, single episode, unspecified: Secondary | ICD-10-CM | POA: Insufficient documentation

## 2011-08-15 DIAGNOSIS — F3289 Other specified depressive episodes: Secondary | ICD-10-CM | POA: Insufficient documentation

## 2011-08-15 DIAGNOSIS — I10 Essential (primary) hypertension: Secondary | ICD-10-CM | POA: Insufficient documentation

## 2011-08-15 HISTORY — DX: Major depressive disorder, single episode, unspecified: F32.9

## 2011-08-15 HISTORY — DX: Depression, unspecified: F32.A

## 2011-08-15 LAB — COMPREHENSIVE METABOLIC PANEL
AST: 23 U/L (ref 0–37)
Albumin: 4.2 g/dL (ref 3.5–5.2)
Alkaline Phosphatase: 95 U/L (ref 39–117)
BUN: 13 mg/dL (ref 6–23)
Chloride: 105 mEq/L (ref 96–112)
Potassium: 3.7 mEq/L (ref 3.5–5.1)
Total Bilirubin: 1 mg/dL (ref 0.3–1.2)

## 2011-08-15 LAB — CBC
HCT: 41.8 % (ref 39.0–52.0)
Platelets: 258 10*3/uL (ref 150–400)
RBC: 4.69 MIL/uL (ref 4.22–5.81)
RDW: 13.1 % (ref 11.5–15.5)
WBC: 9.2 10*3/uL (ref 4.0–10.5)

## 2011-08-15 LAB — RAPID URINE DRUG SCREEN, HOSP PERFORMED
Barbiturates: NOT DETECTED
Tetrahydrocannabinol: NOT DETECTED

## 2011-08-15 MED ORDER — QUETIAPINE FUMARATE 100 MG PO TABS
100.0000 mg | ORAL_TABLET | Freq: Every day | ORAL | Status: DC
  Administered 2011-08-15: 100 mg via ORAL
  Filled 2011-08-15: qty 1

## 2011-08-15 MED ORDER — HALOPERIDOL 5 MG PO TABS
5.0000 mg | ORAL_TABLET | Freq: Two times a day (BID) | ORAL | Status: DC
  Administered 2011-08-15: 5 mg via ORAL
  Filled 2011-08-15: qty 1

## 2011-08-15 MED ORDER — LORAZEPAM 1 MG PO TABS: 1.0000 mg | ORAL_TABLET | Freq: Three times a day (TID) | ORAL | Status: DC | PRN

## 2011-08-15 MED ORDER — GABAPENTIN 300 MG PO CAPS
300.0000 mg | ORAL_CAPSULE | Freq: Two times a day (BID) | ORAL | Status: DC
  Administered 2011-08-15: 300 mg via ORAL
  Filled 2011-08-15 (×3): qty 1

## 2011-08-15 MED ORDER — IBUPROFEN 600 MG PO TABS: 600.0000 mg | ORAL_TABLET | Freq: Three times a day (TID) | ORAL | Status: DC | PRN

## 2011-08-15 MED ORDER — ACETAMINOPHEN 325 MG PO TABS: 650.0000 mg | ORAL_TABLET | ORAL | Status: DC | PRN

## 2011-08-15 MED ORDER — ALUM & MAG HYDROXIDE-SIMETH 200-200-20 MG/5ML PO SUSP: 30.0000 mL | ORAL | Status: DC | PRN

## 2011-08-15 MED ORDER — ZOLPIDEM TARTRATE 5 MG PO TABS
5.0000 mg | ORAL_TABLET | Freq: Every evening | ORAL | Status: DC | PRN
  Administered 2011-08-15: 5 mg via ORAL
  Filled 2011-08-15: qty 1

## 2011-08-15 MED ORDER — BUPROPION HCL ER (XL) 150 MG PO TB24
150.0000 mg | ORAL_TABLET | Freq: Every day | ORAL | Status: DC
  Administered 2011-08-15: 150 mg via ORAL
  Filled 2011-08-15 (×2): qty 1

## 2011-08-15 MED ORDER — DULOXETINE HCL 60 MG PO CPEP
60.0000 mg | ORAL_CAPSULE | Freq: Every day | ORAL | Status: DC
  Administered 2011-08-16: 60 mg via ORAL
  Filled 2011-08-15: qty 1

## 2011-08-15 MED ORDER — ONDANSETRON HCL 4 MG PO TABS: 4.0000 mg | ORAL_TABLET | Freq: Three times a day (TID) | ORAL | Status: DC | PRN

## 2011-08-15 NOTE — ED Provider Notes (Signed)
Medical screening examination/treatment/procedure(s) were performed by non-physician practitioner and as supervising physician I was immediately available for consultation/collaboration.   Deshea Pooley R Lorea Kupfer, MD 08/15/11 2345 

## 2011-08-15 NOTE — BH Assessment (Signed)
Assessment Note   [name] [name] is a 31 y.o. male who presents to [facility] with depression/SI.  Pt has worsening depression since the death of his mother in 28-May-2011.  Pt has increased stress due family and work related issues, stating today that he has problems with co-workers that escalated.  Pt says he wanted to hit his co-workers but instead began to hit self in the chest to deal with his anger.  Pt says he has been off his meds since 2011-05-28 because he felt better and didn't need them.  Pt says that since his mother died in 2011-05-28 he had worsening depression.  Pt told this writer--"I'm not as suicidal as I was earlier".  Pt has no plan or intent to harm self.  Telepsych will be arranged to determine disposition.       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 Mental disorder   . Hypertension   . Depression    Axis IV: other psychosocial or environmental problems, problems related to social environment and problems with primary support group Axis V: 41-50 serious symptoms  Past Medical History:  Past Medical History  Diagnosis Date  . Mental disorder   . Hypertension   . Depression     History reviewed. No pertinent past surgical history.  Family History: No family history on file.  Social History:  reports that he has quit smoking. He does not have any smokeless tobacco history on file. He reports that he does not drink alcohol or use illicit drugs.  Additional Social History:  Alcohol / Drug Use Pain Medications: None Prescriptions: None  Over the Counter: None  History of alcohol / drug use?: No history of alcohol / drug abuse Longest period of sobriety (when/how long): None  Allergies: No Known Allergies  Home Medications:  Medications Prior to Admission  Medication Dose Route Frequency Provider Last Rate Last Dose  . acetaminophen (TYLENOL)  tablet 650 mg  650 mg Oral Q4H PRN Fayrene Helper, PA-C      . alum & mag hydroxide-simeth (MAALOX/MYLANTA) 200-200-20 MG/5ML suspension 30 mL  30 mL Oral PRN Fayrene Helper, PA-C      . buPROPion (WELLBUTRIN XL) 24 hr tablet 150 mg  150 mg Oral QHS Fayrene Helper, PA-C      . DULoxetine (CYMBALTA) DR capsule 60 mg  60 mg Oral Q breakfast Fayrene Helper, PA-C      . gabapentin (NEURONTIN) capsule 300 mg  300 mg Oral BID Fayrene Helper, PA-C      . haloperidol (HALDOL) tablet 5 mg  5 mg Oral BID Fayrene Helper, PA-C      . ibuprofen (ADVIL,MOTRIN) tablet 600 mg  600 mg Oral Q8H PRN Fayrene Helper, PA-C      . LORazepam (ATIVAN) tablet 1 mg  1 mg Oral Q8H PRN Fayrene Helper, PA-C      . ondansetron [facility]) tablet 4 mg  4 mg Oral Q8H PRN Fayrene Helper, PA-C      . QUEtiapine (SEROQUEL) tablet 100 mg  100 mg Oral QHS Fayrene Helper, PA-C      . zolpidem (AMBIEN) tablet 5 mg  5 mg Oral QHS PRN Fayrene Helper, PA-C       Medications Prior to Admission  Medication Sig Dispense Refill  . buPROPion (WELLBUTRIN XL) 150 MG 24 hr tablet Take 150 mg by mouth at bedtime.       Marland Kitchen  DULoxetine (CYMBALTA) 60 MG capsule Take 60 mg by mouth every morning.       . gabapentin (NEURONTIN) 300 MG capsule Take 300 mg by mouth 2 (two) times daily.       . QUEtiapine (SEROQUEL) 100 MG tablet Take 100 mg by mouth at bedtime.         OB/GYN Status:  No LMP for male patient.  General Assessment Data Location of Assessment: [facility] ED Living Arrangements: Alone Can pt return to current living arrangement?: Yes Admission Status: Voluntary Is patient capable of signing voluntary admission?: No Transfer from: Acute Hospital Referral Source: MD  Education Status Is patient currently in school?: No Current Grade: None  Highest grade of school patient has completed: Unk  Name of school: [employer]  Contact person: None   Risk to self Suicidal Ideation: Yes-Currently Present Suicidal Intent: No Is patient at risk for suicide?: No Suicidal Plan?: No Access to Means:  No What has been your use of drugs/alcohol within the last 12 months?: Pt denies  Previous Attempts/Gestures: Yes (Gestures Only ) How many times?: 1  Other Self Harm Risks: None  Triggers for Past Attempts: Other (Comment) (Relational Issues ) Intentional Self Injurious Behavior: None Family Suicide History: No Recent stressful life event(s): Conflict (Comment);Loss (Comment) (Mom died in 06-01-2011; Issues with co-workers ) Persecutory voices/beliefs?: No Depression: Yes Depression Symptoms: Loss of interest in usual pleasures;Feeling angry/irritable Substance abuse history and/or treatment for substance abuse?: No Suicide prevention information given to non-admitted patients: Not applicable  Risk to Others Homicidal Ideation: No Thoughts of Harm to Others: No Current Homicidal Intent: No Current Homicidal Plan: No Access to Homicidal Means: No Identified Victim: None  History of harm to others?: No Assessment of Violence: None Noted Violent Behavior Description: None  Does patient have access to weapons?: No Criminal Charges Pending?: No Does patient have a court date: No  Psychosis Hallucinations: None noted Delusions: None noted  Mental Status Report Appear/Hygiene: Disheveled;Body odor;Poor hygiene Eye Contact: Good Motor Activity: Unremarkable Speech: Logical/coherent Level of Consciousness: Alert Mood: Sad;Depressed Affect: Depressed;Sad Anxiety Level: None Thought Processes: Coherent;Relevant Judgement: Unimpaired Orientation: Person;Place;Time;Situation Obsessive Compulsive Thoughts/Behaviors: None  Cognitive Functioning Concentration: Normal Memory: Recent Intact;Remote Intact IQ: Average Insight: Fair Impulse Control: Fair Appetite: Good Weight Loss: 0  Weight Gain: 0  Sleep: No Change Total Hours of Sleep: 8  Vegetative Symptoms: None  Prior Inpatient Therapy Prior Inpatient Therapy: Yes Prior Therapy Dates: 2012 Prior Therapy  Facilty/Provider(s): [facility], [facility] Reason for Treatment: Depression/SI  Prior Outpatient Therapy Prior Outpatient Therapy: Yes Prior Therapy Dates: Current  Prior Therapy Facilty/Provider(s): [facility]  Reason for Treatment: Med Mgt   ADL Screening (condition at time of admission) Patient's cognitive ability adequate to safely complete daily activities?: Yes Patient able to express need for assistance with ADLs?: Yes Independently performs ADLs?: Yes Weakness of Legs: None Weakness of Arms/Hands: None       Abuse/Neglect Assessment (Assessment to be complete while patient is alone) Physical Abuse: Denies Verbal Abuse: Denies Sexual Abuse: Denies Exploitation of patient/patient's resources: Denies Self-Neglect: Denies Values / Beliefs Cultural Requests During Hospitalization: None Spiritual Requests During Hospitalization: None Consults Spiritual Care Consult Needed: No Social Work Consult Needed: No Merchant navy officer (For Healthcare) Advance Directive: Patient does not have advance directive;Patient would not like information Pre-existing out of facility DNR order (yellow form or pink MOST form): No    Additional Information 1:1 In Past 12 Months?: No CIRT Risk: No Elopement Risk: No Does patient have medical  clearance?: Yes     Disposition:  Disposition Disposition of Patient: Referred to (Telepsych ) Patient referred to: Other (Comment) (Telepsych )  On Site Evaluation by:   Reviewed with Physician:     Murrell Redden 08/15/2011 9:47 PM

## 2011-08-15 NOTE — ED Notes (Signed)
Pt states he was at work and was under stress when he was "blamed" for problems there.  Was threatened by staff and stated he became angry and wanted to punch others but instead punched himself in the chest.  Denies SI but says he still feels angry like he could hurt others.  Pt pleasant and cooperative during assessment.

## 2011-08-15 NOTE — ED Notes (Signed)
Patient has been changed into scrubs and wanded 

## 2011-08-15 NOTE — ED Provider Notes (Signed)
History     CSN: [id number]  Arrival date & time 08/15/11  1425   First MD Initiated Contact with Patient 08/15/11 1548      Chief Complaint  Patient presents with  . Medical Clearance    Feeling SI/HI.  stopped taking psych meds in Nov '12.  calm and cooperative in triage.   . Suicidal    (Consider location/radiation/quality/duration/timing/severity/associated sxs/prior treatment) HPI  31 year old male with history of depression, presenting to the ED with chief complaints of suicidal ideation. Patient states since the death of his mother back in 05/12/2023 of last year, he has been depressed.  For the past several days he notice increase in stress including both family stress and work stress. Last night he has thoughts of suicide, but denies actual plan. Patient denies homicidal ideation, visual and auditory hallucination. Patient denies recreational drug use but does admits to alcohol use. Patient also mentioned that he has not been taking his psych meds since 12-May-2023 of last year because he felt that he was doing better. Patient is here requesting for a psych help. States he felt unsafe at home. Patient currently denies headache, fever, chest pain, shortness of breath, abdominal pain, nausea, vomiting, diarrhea. Patient believes he may have an anxiety attack last night.  Past Medical History  Diagnosis Date  . Mental disorder   . Hypertension     History reviewed. No pertinent past surgical history.  No family history on file.  History  Substance Use Topics  . Smoking status: Former Smoker -- 0.5 packs/day  . Smokeless tobacco: Not on file  . Alcohol Use: Yes     Reported drank alcohol only once yesterday      Review of Systems  All other systems reviewed and are negative.    Allergies  Review of patient's allergies indicates no known allergies.  Home Medications   Current Outpatient Rx  Name Route Sig Dispense Refill  . BUPROPION HCL ER (XL) 150 MG PO TB24 Oral  Take 150 mg by mouth at bedtime.     . DULOXETINE HCL 60 MG PO CPEP Oral Take 60 mg by mouth every morning.     Marland Kitchen GABAPENTIN 300 MG PO CAPS Oral Take 300 mg by mouth 2 (two) times daily.     . QUETIAPINE FUMARATE 100 MG PO TABS Oral Take 100 mg by mouth at bedtime.       BP 120/84  Pulse 88  Temp(Src) 99.1 F (37.3 C) (Oral)  Resp 20  SpO2 98%  Physical Exam  Nursing note and vitals reviewed. Constitutional: He appears well-developed and well-nourished. No distress.       Awake, alert, nontoxic appearance  HENT:  Head: Atraumatic.  Eyes: Conjunctivae are normal. Right eye exhibits no discharge. Left eye exhibits no discharge.  Neck: Normal range of motion. Neck supple.  Cardiovascular: Normal rate and regular rhythm.   Pulmonary/Chest: Effort normal. No respiratory distress. He exhibits no tenderness.  Abdominal: Soft. There is no tenderness. There is no rebound.  Musculoskeletal: He exhibits no tenderness.       ROM appears intact, no obvious focal weakness  Neurological: He is alert.  Skin: Skin is warm and dry. No rash noted.  Psychiatric: His speech is normal and behavior is normal. Judgment and thought content normal. Cognition and memory are normal. He exhibits a depressed mood.    ED Course  Procedures (including critical care time)   Labs Reviewed  URINE RAPID DRUG SCREEN (HOSP PERFORMED)  CBC  COMPREHENSIVE METABOLIC PANEL  ETHANOL   No results found.   No diagnosis found.  Results for orders placed during the hospital encounter of 08/15/11  CBC      Component Value Range   WBC 9.2  4.0 - 10.5 (K/uL)   RBC 4.69  4.22 - 5.81 (MIL/uL)   Hemoglobin 14.1  13.0 - 17.0 (g/dL)   HCT 11.9  14.7 - 82.9 (%)   MCV 89.1  78.0 - 100.0 (fL)   MCH 30.1  26.0 - 34.0 (pg)   MCHC 33.7  30.0 - 36.0 (g/dL)   RDW 56.2  13.0 - 86.5 (%)   Platelets 258  150 - 400 (K/uL)  COMPREHENSIVE METABOLIC PANEL      Component Value Range   Sodium 140  135 - 145 (mEq/L)   Potassium  3.7  3.5 - 5.1 (mEq/L)   Chloride 105  96 - 112 (mEq/L)   CO2 27  19 - 32 (mEq/L)   Glucose, Bld 105 (*) 70 - 99 (mg/dL)   BUN 13  6 - 23 (mg/dL)   Creatinine, Ser 7.84  0.50 - 1.35 (mg/dL)   Calcium 9.6  8.4 - 69.6 (mg/dL)   Total Protein 7.2  6.0 - 8.3 (g/dL)   Albumin 4.2  3.5 - 5.2 (g/dL)   AST 23  0 - 37 (U/L)   ALT 25  0 - 53 (U/L)   Alkaline Phosphatase 95  39 - 117 (U/L)   Total Bilirubin 1.0  0.3 - 1.2 (mg/dL)   GFR calc non Af Amer >90  >90 (mL/min)   GFR calc Af Amer >90  >90 (mL/min)  URINE RAPID DRUG SCREEN (HOSP PERFORMED)      Component Value Range   Opiates NONE DETECTED  NONE DETECTED    Cocaine NONE DETECTED  NONE DETECTED    Benzodiazepines NONE DETECTED  NONE DETECTED    Amphetamines NONE DETECTED  NONE DETECTED    Tetrahydrocannabinol NONE DETECTED  NONE DETECTED    Barbiturates NONE DETECTED  NONE DETECTED   ETHANOL      Component Value Range   Alcohol, Ethyl (B) <11  0 - 11 (mg/dL)   No results found.    MDM  Pt request psychiatric help, has been out of meds for 5 months.  Is medically cleared. Will consult with [facility] for further management.  Will need IVC paper filled.  4:22 PM Holding order and med rec filled.    7:05 PM Spoke with [facility], who agrees to continue care for patient.    Fayrene Helper, PA-C 08/15/11 [id number]

## 2011-08-15 NOTE — ED Notes (Signed)
Patient reports that the stress has become to much and had suicidal thoughts prior to arrival. Reports personal and work stress has become to much, denies attempt. History of attempt in past

## 2011-08-16 NOTE — ED Provider Notes (Signed)
Per nursing staff [place]-- [place] transport. Had telepsych with recommendations for discharge with outpatient referrals.   Forbes Cellar, MD 08/16/11 [phone]

## 2011-08-16 NOTE — ED Provider Notes (Signed)
BP 106/71  Pulse 80  Temp(Src) 97.7 F (36.5 C) (Oral)  Resp 18  SpO2 97%  Patient seen and evaluated by me. No complaints at this time.   Forbes Cellar, MD 08/16/11 0830

## 2011-08-22 ENCOUNTER — Emergency Department (HOSPITAL_COMMUNITY): Payer: Self-pay

## 2011-08-22 ENCOUNTER — Encounter (HOSPITAL_COMMUNITY): Payer: Self-pay | Admitting: Neurology

## 2011-08-22 ENCOUNTER — Emergency Department (HOSPITAL_COMMUNITY)
Admission: EM | Admit: 2011-08-22 | Discharge: 2011-08-22 | Disposition: A | Discharge status: Home / Self Care | Payer: Self-pay | Attending: Emergency Medicine | Admitting: Emergency Medicine

## 2011-08-22 DIAGNOSIS — I10 Essential (primary) hypertension: Secondary | ICD-10-CM | POA: Insufficient documentation

## 2011-08-22 DIAGNOSIS — F3289 Other specified depressive episodes: Secondary | ICD-10-CM | POA: Insufficient documentation

## 2011-08-22 DIAGNOSIS — F329 Major depressive disorder, single episode, unspecified: Secondary | ICD-10-CM | POA: Insufficient documentation

## 2011-08-22 DIAGNOSIS — R112 Nausea with vomiting, unspecified: Secondary | ICD-10-CM | POA: Insufficient documentation

## 2011-08-22 MED ORDER — OMEPRAZOLE 20 MG PO CPDR: DELAYED_RELEASE_CAPSULE | ORAL | Status: DC

## 2011-08-22 MED ORDER — ONDANSETRON 8 MG PO TBDP: 8.0000 mg | ORAL_TABLET | Freq: Three times a day (TID) | ORAL | Status: DC | PRN

## 2011-08-22 MED ORDER — SODIUM CHLORIDE 0.9 % IV BOLUS (SEPSIS)
1000.0000 mL | Freq: Once | INTRAVENOUS | Status: AC
  Administered 2011-08-22: 1000 mL via INTRAVENOUS

## 2011-08-22 MED ORDER — ONDANSETRON HCL 4 MG/2ML IJ SOLN
4.0000 mg | Freq: Once | INTRAMUSCULAR | Status: AC
  Administered 2011-08-22: 4 mg via INTRAVENOUS
  Filled 2011-08-22: qty 2

## 2011-08-22 NOTE — ED Notes (Signed)
Pt provided ginger ale.  

## 2011-08-22 NOTE — ED Provider Notes (Signed)
History     CSN: [id number]  Arrival date & time 08/22/11  4401   First MD Initiated Contact with Patient 08/22/11 [phone]      Chief Complaint  Patient presents with  . Nausea  . Emesis  . Diarrhea    (Consider location/radiation/quality/duration/timing/severity/associated sxs/prior treatment) HPI Comments: Patient presents with complaints of 3 days of nasal congestion, sore throat, cough, chills, nausea/vomiting/diarrhea. Patient states he has been able to keep down fluids at times but no solids. Last episode of vomiting was 6 AM. Also complains of fatigue. Patient reports chest pain with cough. Patient had 2 episodes of diarrhea yesterday. Positive sick contacts where he lives. Patient states he has not been on his psychiatric medications for 2 weeks.  Patient is a 31 y.o. male presenting with vomiting and diarrhea. The history is provided by the patient.  Emesis  This is a new problem. The current episode started more than 2 days ago. The problem has not changed since onset.There has been no fever. Associated symptoms include chills, cough, diarrhea and URI. Pertinent negatives include no abdominal pain, no fever, no headaches and no myalgias.  Diarrhea The primary symptoms include fatigue, nausea, vomiting and diarrhea. Primary symptoms do not include fever, abdominal pain, dysuria, myalgias or rash.  The illness is also significant for chills.    Past Medical History  Diagnosis Date  . Mental disorder   . Hypertension   . Depression     History reviewed. No pertinent past surgical history.  No family history on file.  History  Substance Use Topics  . Smoking status: Former Smoker -- 0.5 packs/day  . Smokeless tobacco: Not on file  . Alcohol Use: No     Reported drank alcohol only once yesterday      Review of Systems  Constitutional: Positive for chills and fatigue. Negative for fever.  HENT: Positive for congestion, sore throat and rhinorrhea. Negative for ear  pain, trouble swallowing, neck pain and neck stiffness.   Eyes: Negative for redness.  Respiratory: Positive for cough and shortness of breath.   Cardiovascular: Negative for leg swelling.  Gastrointestinal: Positive for nausea, vomiting and diarrhea. Negative for abdominal pain and blood in stool.  Genitourinary: Negative for dysuria.  Musculoskeletal: Negative for myalgias.  Skin: Negative for rash.  Neurological: Negative for headaches.    Allergies  Review of patient's allergies indicates no known allergies.  Home Medications   Current Outpatient Rx  Name Route Sig Dispense Refill  . BUPROPION HCL ER (XL) 150 MG PO TB24 Oral Take 150 mg by mouth at bedtime.     . DULOXETINE HCL 60 MG PO CPEP Oral Take 60 mg by mouth every morning.     Marland Kitchen GABAPENTIN 300 MG PO CAPS Oral Take 300 mg by mouth 2 (two) times daily.     . QUETIAPINE FUMARATE 100 MG PO TABS Oral Take 100 mg by mouth at bedtime.       BP 122/70  Pulse 92  Temp(Src) 99.3 F (37.4 C) (Oral)  Resp 16  SpO2 97%  Physical Exam  Nursing note and vitals reviewed. Constitutional: He is oriented to person, place, and time. He appears well-developed and well-nourished.  HENT:  Head: Normocephalic and atraumatic.  Right Ear: Hearing, tympanic membrane and ear canal normal.  Left Ear: Hearing, tympanic membrane and ear canal normal.  Nose: Mucosal edema present. No rhinorrhea.  Mouth/Throat: Uvula is midline, oropharynx is clear and moist and mucous membranes are normal.  Eyes: Conjunctivae  are normal. Right eye exhibits no discharge. Left eye exhibits no discharge.  Neck: Normal range of motion. Neck supple.  Cardiovascular: Normal rate, regular rhythm and normal heart sounds.   Pulmonary/Chest: Effort normal and breath sounds normal.  Abdominal: Soft. There is no tenderness.  Neurological: He is alert and oriented to person, place, and time.  Skin: Skin is warm and dry.  Psychiatric: He has a normal mood and affect.     ED Course  Procedures (including critical care time)  Labs Reviewed - No data to display No results found.   1. Nausea vomiting and diarrhea     9:45 AM Patient seen and examined. CXR ordered. Medications ordered.   Vital signs reviewed and are as follows: Filed Vitals:   08/22/11 0931  BP: 122/70  Pulse: 92  Temp: 99.3 F (37.4 C)  Resp: 16   9:45 AM Patient was discussed with Laray Anger, DO  11:25 AM Pt re-examined. States he is feeling better but weak. Will PO challenge.   12:16 PM Patient drinking ginger ale in room without vomiting. Counseled on clear liquids for 24 hours, b.r.a.t. diet.  12:16 PM The patient was urged to return to the Emergency Department immediately with worsening of current symptoms, worsening abdominal pain, persistent vomiting, blood noted in stools, fever, or any other concerns. The patient verbalized understanding.    MDM  Patient with symptoms consistent with viral gastroenteritis. Chest pain and sore throat likely 2/2 vomiting. CXR without PNA. Vitals are stable, tachycardia improved, no fever.  No signs of dehydration, tolerating PO's.  + sick contacts. No focal abdominal pain, no concern for appendicitis, cholecystitis, pancreatitis, ruptured viscus, UTI, kidney stone, or any other abdominal etiology.  Supportive therapy indicated with return if symptoms worsen.  Patient counseled.         Renne Crigler, [place] 08/22/11 1218

## 2011-08-22 NOTE — ED Notes (Signed)
Per ems-pt comes from home reporting n/v/d x 3 days. EMS reporting skin feels warm. Reporting CP when breathing. 140/62,  HR 98 SR. NAD. A & O x 4

## 2011-08-22 NOTE — ED Notes (Signed)
Patient transported to X-ray 

## 2011-08-22 NOTE — Discharge Instructions (Signed)
Please read and follow all provided instructions.  Your diagnoses today include:  1. Nausea vomiting and diarrhea     Tests performed today include:  Chest x-ray which was normal  Vital signs. See below for your results today.   Medications prescribed:   Zofran (ondansetron) - for nausea and vomiting  Prilosec (omeprazole) - stomach acid reducer  Take any prescribed medications only as directed.  Home care instructions:   Follow any educational materials contained in this packet.   Your abdominal pain, nausea, vomiting, and diarrhea may be caused by a viral gastroenteritis also called 'stomach flu'. You should rest for the next several days. Keep drinking plenty of fluids and use the medicine for nausea as directed.    Drink clear liquids for the next 24 hours and introduce solid foods slowly after 24 hours using the b.r.a.t. diet (see attached information sheet).    Follow-up instructions: Please follow-up with your primary care provider in the next 2 days for further evaluation of your symptoms. If you are not feeling better in 48 hours you may have a condition that is more serious and you need re-evaluation. If you do not have a primary care doctor -- see below for referral information.   Return instructions:  SEEK IMMEDIATE MEDICAL ATTENTION IF:  If you have pain that does not go away or becomes severe   A temperature above 101F develops   Repeated vomiting occurs (multiple episodes)   If you have pain that becomes localized to portions of the abdomen. The right side could possibly be appendicitis. In an adult, the left lower portion of the abdomen could be colitis or diverticulitis.   Blood is being passed in stools or vomit (bright red or black tarry stools)   You develop chest pain, difficulty breathing, dizziness or fainting, or become confused, poorly responsive, or inconsolable (young children)  If you have any other emergent concerns regarding your  health  Additional Information: Abdominal (belly) pain can be caused by many things. Your caregiver performed an examination and possibly ordered blood/urine tests and imaging (CT scan, x-rays, ultrasound). Many cases can be observed and treated at home after initial evaluation in the emergency department. Even though you are being discharged home, abdominal pain can be unpredictable. Therefore, you need a repeated exam if your pain does not resolve, returns, or worsens. Most patients with abdominal pain don't have to be admitted to the hospital or have surgery, but serious problems like appendicitis and gallbladder attacks can start out as nonspecific pain. Many abdominal conditions cannot be diagnosed in one visit, so follow-up evaluations are very important.  Your vital signs today were: BP 112/63  Pulse 87  Temp(Src) 98 F (36.7 C) (Oral)  Resp 14  SpO2 97% If your blood pressure (bp) was elevated above 135/85 this visit, please have this repeated by your doctor within one month. -------------- No Primary Care Doctor Call [facility]  [phone] Other agencies that provide inexpensive medical care    [facility]  [phone]    [facility]  [phone]    [facility]  [phone]    [facility]  [phone]    [facility]  [phone]    [facility]  [phone] -------------- RESOURCE GUIDE:  Dental Problems  Patients with Medicaid: [facility] [phone] [address]  [address] Phone:  [phone]                                                   Phone:  [phone]  If unable to pay or uninsured, contact:  [facility] or [facility]. to become qualified for the adult dental clinic.  Chronic Pain Problems Contact [name]  [phone] Patients need to be referred by their primary care  doctor.  Insufficient Money for Medicine Contact [employer]:  call "211" or [facility] [phone].  Psychological Services [facility]  [phone] [facility]  [phone] [facility] Mental Health   [phone] (emergency services [phone])  Substance Abuse Resources [facility]  [phone] [facility] [phone] [facility] [phone] Floydene Flock [phone] Residential & Outpatient Substance Abuse Program  [phone]  Abuse/Neglect [facility] Child Abuse Hotline [phone] [facility] Child Abuse Hotline [phone] (After Hours)  Emergency Shelter [facility] [phone]  Maternity Homes Room at the [place] [facility] [phone] [place] [facility] [phone]  [facility] Resources  [facility] [place]     [employer]                          [facility] Dept. [address] [place]                       [address]      [address]  Blondell Reveal Phone:  [phone]                                   Phone:  [phone]                 Phone:  [phone]  [facility] Phone:  [phone]  [facility] Child Abuse Hotline [phone] (After Hours)

## 2011-08-24 NOTE — ED Provider Notes (Signed)
Medical screening examination/treatment/procedure(s) were performed by non-physician practitioner and as supervising physician I was immediately available for consultation/collaboration.   Hernando Reali M Nycere Presley, DO 08/24/11 1247 

## 2011-08-29 ENCOUNTER — Encounter (HOSPITAL_COMMUNITY): Payer: Self-pay | Admitting: Emergency Medicine

## 2011-08-29 ENCOUNTER — Emergency Department (HOSPITAL_COMMUNITY)
Admission: EM | Admit: 2011-08-29 | Discharge: 2011-08-30 | Disposition: A | Discharge status: Home / Self Care | Payer: Self-pay | Attending: Emergency Medicine | Admitting: Emergency Medicine

## 2011-08-29 DIAGNOSIS — F32A Depression, unspecified: Secondary | ICD-10-CM

## 2011-08-29 DIAGNOSIS — I10 Essential (primary) hypertension: Secondary | ICD-10-CM | POA: Insufficient documentation

## 2011-08-29 DIAGNOSIS — F319 Bipolar disorder, unspecified: Secondary | ICD-10-CM | POA: Insufficient documentation

## 2011-08-29 DIAGNOSIS — F329 Major depressive disorder, single episode, unspecified: Secondary | ICD-10-CM

## 2011-08-29 DIAGNOSIS — F411 Generalized anxiety disorder: Secondary | ICD-10-CM | POA: Insufficient documentation

## 2011-08-29 HISTORY — DX: Bipolar disorder, unspecified: F31.9

## 2011-08-29 LAB — BASIC METABOLIC PANEL
BUN: 11 mg/dL (ref 6–23)
CO2: 27 mEq/L (ref 19–32)
Calcium: 9.3 mg/dL (ref 8.4–10.5)
Chloride: 103 mEq/L (ref 96–112)
Creatinine, Ser: 0.87 mg/dL (ref 0.50–1.35)
GFR calc Af Amer: >90 mL/min (ref >90)
GFR calc non Af Amer: >90 mL/min (ref >90)
Glucose, Bld: 101 mg/dL — ABNORMAL HIGH (ref 70–99)
Potassium: 4 mEq/L (ref 3.5–5.1)
Sodium: 139 mEq/L (ref 135–145)

## 2011-08-29 LAB — ETHANOL: Alcohol, Ethyl (B): 11 mg/dL (ref 0–11)

## 2011-08-29 LAB — URINALYSIS, ROUTINE W REFLEX MICROSCOPIC
Bilirubin Urine: NEGATIVE
Glucose, UA: NEGATIVE mg/dL
Hgb urine dipstick: NEGATIVE
Ketones, ur: NEGATIVE mg/dL
Leukocytes, UA: NEGATIVE
Nitrite: NEGATIVE
Protein, ur: NEGATIVE mg/dL
Specific Gravity, Urine: 1.027 (ref 1.005–1.030)
Urobilinogen, UA: 0.2 mg/dL (ref 0.0–1.0)
pH: 5.5 (ref 5.0–8.0)

## 2011-08-29 LAB — CBC
HCT: 45.5 % (ref 39.0–52.0)
Hemoglobin: 15.5 g/dL (ref 13.0–17.0)
MCH: 30.6 pg (ref 26.0–34.0)
MCHC: 34.1 g/dL (ref 30.0–36.0)
MCV: 89.7 fL (ref 78.0–100.0)
Platelets: 314 10*3/uL (ref 150–400)
RBC: 5.07 MIL/uL (ref 4.22–5.81)
RDW: 12.6 % (ref 11.5–15.5)
WBC: 10.5 10*3/uL (ref 4.0–10.5)

## 2011-08-29 LAB — RAPID URINE DRUG SCREEN, HOSP PERFORMED
Amphetamines: NOT DETECTED
Barbiturates: NOT DETECTED
Benzodiazepines: NOT DETECTED
Cocaine: NOT DETECTED
Opiates: NOT DETECTED
Tetrahydrocannabinol: NOT DETECTED

## 2011-08-29 MED ORDER — ALUM & MAG HYDROXIDE-SIMETH 200-200-20 MG/5ML PO SUSP: 30.0000 mL | ORAL | Status: DC | PRN

## 2011-08-29 MED ORDER — ACETAMINOPHEN 325 MG PO TABS: 650.0000 mg | ORAL_TABLET | ORAL | Status: DC | PRN

## 2011-08-29 MED ORDER — LORAZEPAM 1 MG PO TABS: 1.0000 mg | ORAL_TABLET | Freq: Three times a day (TID) | ORAL | Status: DC | PRN

## 2011-08-29 MED ORDER — ONDANSETRON HCL 4 MG PO TABS: 4.0000 mg | ORAL_TABLET | Freq: Three times a day (TID) | ORAL | Status: DC | PRN

## 2011-08-29 MED ORDER — IBUPROFEN 600 MG PO TABS: 600.0000 mg | ORAL_TABLET | Freq: Three times a day (TID) | ORAL | Status: DC | PRN

## 2011-08-29 MED ORDER — ZOLPIDEM TARTRATE 5 MG PO TABS: 5.0000 mg | ORAL_TABLET | Freq: Every evening | ORAL | Status: DC | PRN

## 2011-08-29 NOTE — ED Notes (Signed)
Jeans, shoes, socks, boxers, 1 T-shirt. 1 bag of belongings. Security and patient have both been wanded by security.
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
## 2011-08-30 NOTE — ED Provider Notes (Signed)
  Physical Exam  BP 132/70  Pulse 109  Temp(Src) 100.9 F (38.3 C) (Oral)  Resp 20  SpO2 99%  Physical Exam  ED Course  Procedures  MDM Change of shift, patient evaluated by the psychiatrist who agrees that the patient is stable for discharge, is not suicidal and can followup in the outpatient setting. I have given him a list of followup numbers for psychiatric followup.  Plan discussed with the patient the need for close followup, behavioral health assessment team member Barth Kirks has given him followup contact information. The patient is amenable to the plan and agreeable to return should his symptoms worsen.      Vida Roller, MD 08/30/11 [phone]

## 2011-08-30 NOTE — Discharge Instructions (Signed)
You may return to ER for severe or worsening symptoms.  The psychiatrist has evaluated you tonight and agrees that you are stable for discharge and can follow up in the outpatient setting.  See the phone numbers below if you don't have a psychiatrist.- call for appointment today.    RESOURCE GUIDE  Dental Problems  Patients with Medicaid: [facility] [phone] [address]                                           [phone] [address] Phone:  [phone]                                                  Phone:  [phone]  If unable to pay or uninsured, contact:  [facility] or [facility]. to become qualified for the adult dental clinic.  Chronic Pain Problems Contact [name]  [phone] Patients need to be referred by their primary care doctor.  Insufficient Money for Medicine Contact [employer]:  call "211" or [facility] 782-788-4061.  No Primary Care Doctor Call [facility]  [phone] Other agencies that provide inexpensive medical care    [facility]  [phone]    [facility]  [phone]    [facility]  [phone]    [facility]  [phone]    [facility]  [phone]    [facility]  [phone]  Psychological Services [facility]  [phone] [facility]  [phone] [facility]   [phone] (emergency services [phone])  Substance Abuse Resources [facility]  [phone] [facility] [phone] [facility] [phone] Floydene Flock [phone] Residential & Outpatient Substance Abuse Program  [phone]  Abuse/Neglect [facility] Child Abuse Hotline (250)001-8386 [facility] Child Abuse Hotline [phone] (After Hours)  Emergency Shelter [facility] 6295452230  Maternity Homes [facility] at the [place] [facility] [phone] [facility] [phone]  MRSA Hotline #:   380 601 7399    [facility] Resources  [facility]     [employer]                          [facility] [address] [place]                       [address]      [address]  [place]                                                Cristobal Goldmann Phone:  [phone]  Phone:  [phone]                 Phone:  [phone]  [place] [facility] Phone:  [phone]  [place] Child Abuse Hotline [phone]) 342-1394 [phone] (After Hours)   

## 2011-12-22 ENCOUNTER — Emergency Department (HOSPITAL_COMMUNITY)
Admission: EM | Admit: 2011-12-22 | Discharge: 2011-12-22 | Disposition: A | Discharge status: Home / Self Care | Payer: Self-pay | Attending: Emergency Medicine | Admitting: Emergency Medicine

## 2011-12-22 ENCOUNTER — Encounter (HOSPITAL_COMMUNITY): Payer: Self-pay | Admitting: Emergency Medicine

## 2011-12-22 DIAGNOSIS — L559 Sunburn, unspecified: Secondary | ICD-10-CM | POA: Insufficient documentation

## 2011-12-22 DIAGNOSIS — T675XXA Heat exhaustion, unspecified, initial encounter: Secondary | ICD-10-CM | POA: Insufficient documentation

## 2011-12-22 DIAGNOSIS — F319 Bipolar disorder, unspecified: Secondary | ICD-10-CM | POA: Insufficient documentation

## 2011-12-22 DIAGNOSIS — I10 Essential (primary) hypertension: Secondary | ICD-10-CM | POA: Insufficient documentation

## 2011-12-22 DIAGNOSIS — W899XXA Exposure to unspecified man-made visible and ultraviolet light, initial encounter: Secondary | ICD-10-CM | POA: Insufficient documentation

## 2011-12-22 DIAGNOSIS — X30XXXA Exposure to excessive natural heat, initial encounter: Secondary | ICD-10-CM | POA: Insufficient documentation

## 2011-12-22 DIAGNOSIS — Z87891 Personal history of nicotine dependence: Secondary | ICD-10-CM | POA: Insufficient documentation

## 2011-12-22 HISTORY — DX: Cardiac arrhythmia, unspecified: I49.9

## 2011-12-22 LAB — BASIC METABOLIC PANEL
Chloride: 103 mEq/L (ref 96–112)
GFR calc Af Amer: >90 mL/min (ref >90)
Potassium: 3.5 mEq/L (ref 3.5–5.1)
Sodium: 140 mEq/L (ref 135–145)

## 2011-12-22 LAB — CBC WITH DIFFERENTIAL/PLATELET
Basophils Absolute: 0 10*3/uL (ref 0.0–0.1)
Basophils Relative: 0 % (ref 0–1)
Hemoglobin: 14.4 g/dL (ref 13.0–17.0)
MCHC: 34.6 g/dL (ref 30.0–36.0)
Neutro Abs: 5.6 10*3/uL (ref 1.7–7.7)
Neutrophils Relative %: 64 % (ref 43–77)
RDW: 12.7 % (ref 11.5–15.5)

## 2011-12-22 LAB — URINALYSIS, ROUTINE W REFLEX MICROSCOPIC
Nitrite: NEGATIVE
Specific Gravity, Urine: 1.028 (ref 1.005–1.030)
Urobilinogen, UA: 1 mg/dL (ref 0.0–1.0)

## 2011-12-22 LAB — CK: Total CK: 226 U/L (ref 7–232)

## 2011-12-22 MED ORDER — ONDANSETRON HCL 4 MG/2ML IJ SOLN
4.0000 mg | Freq: Once | INTRAMUSCULAR | Status: AC
  Administered 2011-12-22: 4 mg via INTRAVENOUS
  Filled 2011-12-22 (×2): qty 2

## 2011-12-22 MED ORDER — SODIUM CHLORIDE 0.9 % IV SOLN
1000.0000 mL | INTRAVENOUS | Status: DC
  Administered 2011-12-22: 1000 mL via INTRAVENOUS

## 2011-12-22 MED ORDER — SODIUM CHLORIDE 0.9 % IV SOLN
1000.0000 mL | Freq: Once | INTRAVENOUS | Status: AC
  Administered 2011-12-22: 1000 mL via INTRAVENOUS

## 2011-12-22 MED ORDER — SODIUM CHLORIDE 0.9 % IV SOLN
Freq: Once | INTRAVENOUS | Status: AC
  Administered 2011-12-22: 04:00:00 via INTRAVENOUS

## 2011-12-22 MED ORDER — ONDANSETRON HCL 4 MG PO TABS: 4.0000 mg | ORAL_TABLET | Freq: Four times a day (QID) | ORAL | Status: DC

## 2011-12-22 NOTE — ED Notes (Addendum)
Pt was complaint of nausea, decreased appetite and chest pain. Psych meds discontinued three weeks ago per EMS. Pt stated that he stopped in Nov 2012. Pt stated that he had 3 beers and shots of vodka last night. Pt stated he felt Wednesday at work, pt running  down the steps and lost balance  and fell on back. Pt did not seek care after fall.

## 2011-12-22 NOTE — ED Notes (Signed)
Upon ambulation Pt felt light headed and dizzy.  Stated that this often happens when he gets up to walk.

## 2011-12-22 NOTE — ED Provider Notes (Addendum)
History     CSN: [id number]  Arrival date & time 12/22/11  0057   First MD Initiated Contact with Patient 12/22/11 0110      Chief Complaint  Patient presents with  . chest wall pain, dizziness     (Consider location/radiation/quality/duration/timing/severity/associated sxs/prior treatment) HPI 31 year old male presents to emergency department complaining of dizziness with standing, nausea without vomiting, and anterior chest wall pain. 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 Patient was at work just prior to arrival when he became dizzy and nauseated. Patient reports he really hasn't been the last 24 hours do to anorexia and nausea. He denies any abdominal pain. Patient had brief 5-10 seconds of anterior chest wall pain, which has since resolved as long as no one is pushing on his chest. He denies previous cardiac problems. Patient did have a fall yesterday, but fell onto his back. Patient did drink some alcohol yesterday which is not out of his norm. Patient denies vertigo. Dizziness as described as feeling like he is going to pass out. Patient admits to not drinking enough water yesterday, has not urinated much today. Patient feels hot all over. Past Medical History  Diagnosis Date  . Mental disorder   . Hypertension   . Depression   . Bipolar 1 disorder   . Irregular heart rate     History reviewed. No pertinent past surgical history.  Family History  Problem Relation Age of Onset  . Hypertension Mother   . Hypertension Other     History  Substance Use Topics  . Smoking status: Former Smoker -- 0.5 packs/day  . Smokeless tobacco: Not on file  . Alcohol Use: Yes     Reported drank alcohol only once yesterday      Review of Systems  Constitutional: Positive for chills, appetite change and fatigue.  HENT: Negative.   Eyes: Negative.   Respiratory: Negative.   Cardiovascular: Negative.   Gastrointestinal: Positive for nausea.    Genitourinary: Positive for decreased urine volume.  Musculoskeletal: Positive for myalgias.  Skin: Negative.   Neurological: Positive for dizziness.  Hematological: Negative.   Psychiatric/Behavioral: Negative.   All other systems reviewed and are negative.    Allergies  Review of patient's allergies indicates no known allergies.  Home Medications  No current outpatient prescriptions on file.  BP 139/91  Pulse 79  Temp 98.7 F (37.1 C)  Resp 20  SpO2 99%  Physical Exam  Nursing note and vitals reviewed. Constitutional: He is oriented to person, place, and time. He appears well-developed and well-nourished. No distress.       Patient appears fatigued  HENT:  Head: Normocephalic and atraumatic.  Nose: Nose normal.  Mouth/Throat: No oropharyngeal exudate.       Dry mucous membranes  Eyes: Pupils are equal, round, and reactive to light.       Conjunctiva injected bilaterally right worse than left  Neck: Normal range of motion. Neck supple. No JVD present. No tracheal deviation present. No thyromegaly present.  Cardiovascular: Normal rate, regular rhythm, normal heart sounds and intact distal pulses.  Exam reveals no gallop and no friction rub.   No murmur heard. Pulmonary/Chest: Effort normal and breath sounds normal. No stridor. No respiratory distress. He has no wheezes. He has no rales. He exhibits no tenderness.  Abdominal: Soft. Bowel sounds are normal. He exhibits no distension and no mass. There is no tenderness. There is no rebound and no guarding.  Musculoskeletal: Normal range of motion.  He exhibits no edema and no tenderness.  Lymphadenopathy:    He has no cervical adenopathy.  Neurological: He is alert and oriented to person, place, and time. He exhibits normal muscle tone. Coordination normal.  Skin: Skin is warm. No rash noted. He is not diaphoretic. No erythema. No pallor.       Sunburn noted over shoulders and upper back  Psychiatric: He has a normal mood  and affect. His behavior is normal. Judgment and thought content normal.    ED Course  Procedures (including critical care time)   Labs Reviewed  CBC WITH DIFFERENTIAL  BASIC METABOLIC PANEL  CK  URINALYSIS, ROUTINE W REFLEX MICROSCOPIC   No results found.  Date: 12/22/2011  Rate: 76  Rhythm: normal sinus rhythm  QRS Axis: normal  Intervals: normal  ST/T Wave abnormalities: normal  Conduction Disutrbances:left anterior fascicular block  Narrative Interpretation:   Old EKG Reviewed: unchanged   1. Heat exhaustion       MDM  31 year old male with probable heat exhaustion versus heat stroke. Workup here unremarkable. Patient received 3 L of fluid and is feeling better. Will discharge with followup with local primary care physician, and time off work for 2 days.        Olivia Mackie, MD 12/22/11 4098  Olivia Mackie, MD 12/22/11 [phone]

## 2011-12-22 NOTE — ED Notes (Signed)
Pt able to keep down water although he st's it made him slightly nauseous.  EDP Otter made aware.  Gave pt crackers to nibble on to see if he can tolerate, fluids going.

## 2011-12-22 NOTE — ED Notes (Signed)
Pt complaints of pain when palpating chest .

## 2011-12-22 NOTE — ED Notes (Signed)
Pt became dizzy walking to bathroom.  Sat pt on hall bad and took orthostatic vitals signs

## 2011-12-22 NOTE — ED Notes (Signed)
[id number]<AV> Expected date:<BR> Expected time:<BR> Means of arrival:<BR> Comments:<BR> EMS/fall/ankle pain

## 2011-12-29 ENCOUNTER — Encounter (HOSPITAL_COMMUNITY): Payer: Self-pay | Admitting: Emergency Medicine

## 2011-12-29 ENCOUNTER — Emergency Department (HOSPITAL_COMMUNITY)
Admission: EM | Admit: 2011-12-29 | Discharge: 2011-12-30 | Disposition: A | Discharge status: Other Acute Inpatient Hospital | Payer: Self-pay | Attending: Emergency Medicine | Admitting: Emergency Medicine

## 2011-12-29 DIAGNOSIS — R4589 Other symptoms and signs involving emotional state: Secondary | ICD-10-CM

## 2011-12-29 DIAGNOSIS — Z8249 Family history of ischemic heart disease and other diseases of the circulatory system: Secondary | ICD-10-CM | POA: Insufficient documentation

## 2011-12-29 DIAGNOSIS — F3289 Other specified depressive episodes: Secondary | ICD-10-CM | POA: Insufficient documentation

## 2011-12-29 DIAGNOSIS — F329 Major depressive disorder, single episode, unspecified: Secondary | ICD-10-CM

## 2011-12-29 DIAGNOSIS — Z87891 Personal history of nicotine dependence: Secondary | ICD-10-CM | POA: Insufficient documentation

## 2011-12-29 LAB — CBC WITH DIFFERENTIAL/PLATELET
Basophils Relative: 0 % (ref 0–1)
Eosinophils Absolute: 0.1 10*3/uL (ref 0.0–0.7)
Eosinophils Relative: 1 % (ref 0–5)
MCH: 30.2 pg (ref 26.0–34.0)
MCHC: 34.5 g/dL (ref 30.0–36.0)
MCV: 87.4 fL (ref 78.0–100.0)
Monocytes Relative: 10 % (ref 3–12)
Neutrophils Relative %: 65 % (ref 43–77)
Platelets: 256 10*3/uL (ref 150–400)

## 2011-12-29 LAB — ETHANOL: Alcohol, Ethyl (B): <11 mg/dL (ref 0–11)

## 2011-12-29 LAB — RAPID URINE DRUG SCREEN, HOSP PERFORMED
Amphetamines: NOT DETECTED
Cocaine: NOT DETECTED
Opiates: NOT DETECTED
Tetrahydrocannabinol: NOT DETECTED

## 2011-12-29 LAB — BASIC METABOLIC PANEL
CO2: 26 mEq/L (ref 19–32)
Chloride: 106 mEq/L (ref 96–112)
Glucose, Bld: 86 mg/dL (ref 70–99)
Potassium: 3.7 mEq/L (ref 3.5–5.1)
Sodium: 140 mEq/L (ref 135–145)

## 2011-12-29 MED ORDER — LORAZEPAM 1 MG PO TABS
1.0000 mg | ORAL_TABLET | Freq: Once | ORAL | Status: AC
  Administered 2011-12-29: 1 mg via ORAL
  Filled 2011-12-29: qty 1

## 2011-12-29 NOTE — BH Assessment (Signed)
Assessment Note   [name] [name] is an 31 y.o. male. Pt reported to the [facility] with a chief complaint of depression. Pt presents as depressed with a blunted affect. Pt has a previous mental health hx of depression with 2 previous hospitalizations. Pt reports that since his last hospitalization in 2012, he has been "doing well" so he went off of his psychiatric medications. Pt states his mother died in 11-30-12and his depression began increasing. Pt states that over the last 3 days he began having AVH, stating that he "hears his mother's voice and sees things on the walls he knows is not there". Pt states that his depressive symptoms include: insomnia (only 4 hrs per night for 2 weeks), fatigue, isolating self from others, loss of interest in usual pleasures and increased anger/irritability. Pt states that recently he has began drinking alcohol daily due to trouble sleeping. Pt reports drinking 5-6 beers daily for the last 2 weeks. Pt shared that he has been irritable and "wants to fight with people", though he has not been physical with anyone. Pt endorses SI without a plan though added he is scared to go home because he can not keep himself safe. Pt denies HI.   Pt is being referred to [facility] for inpatient treatment.   Axis I: Major Depressive Disorder, Recurrent, Severe with Psychotic Features Axis II: Deferred Axis III:  Past Medical History  Diagnosis Date  . Mental disorder   . Hypertension   . Depression   . Bipolar 1 disorder   . Irregular heart rate    Axis IV: occupational problems and other psychosocial or environmental problems Axis V: 21-30 behavior considerably influenced by delusions or hallucinations OR serious impairment in judgment, communication OR inability to function in almost all areas  Past Medical History:  Past Medical History  Diagnosis Date  . Mental disorder   . Hypertension   . Depression   . Bipolar 1 disorder   . Irregular heart rate     History  reviewed. No pertinent past surgical history.  Family History:  Family History  Problem Relation Age of Onset  . Hypertension Mother   . Hypertension Other     Social History:  reports that he has quit smoking. He does not have any smokeless tobacco history on file. He reports that he drinks alcohol. He reports that he does not use illicit drugs.  Additional Social History:  Alcohol / Drug Use History of alcohol / drug use?: Yes Substance #1 Name of Substance 1: Alcohol  1 - Age of First Use: 18 1 - Amount (size/oz): 5-6 beers 1 - Frequency: nightly  1 - Duration: 2 weeks 1 - Last Use / Amount: 12/28/11 5-6 beers  CIWA: CIWA-Ar BP: 150/86 mmHg Pulse Rate: 86  COWS:    Allergies: No Known Allergies  Home Medications:  (Not in a hospital admission)  OB/GYN Status:  No LMP for male patient.  General Assessment Data Location of Assessment: [facility] ED Living Arrangements: Alone Can pt return to current living arrangement?: Yes Admission Status: Voluntary Is patient capable of signing voluntary admission?: Yes Transfer from: Acute Hospital Referral Source: Self/Family/Friend  Education Status Is patient currently in school?: No  Risk to self Suicidal Ideation: Yes-Currently Present Suicidal Intent: No-Not Currently/Within Last 6 Months Is patient at risk for suicide?: Yes Suicidal Plan?: No Access to Means: No What has been your use of drugs/alcohol within the last 12 months?: Alcohol daily for 2 weeks Previous Attempts/Gestures: Yes How  many times?: 1  (2012) Other Self Harm Risks: none Triggers for Past Attempts: Other (Comment) (depression) Intentional Self Injurious Behavior: None Family Suicide History: No Recent stressful life event(s): Loss (Comment) (mother died in 05-20-2011; issues at work; Furniture conservator/restorer ) Persecutory voices/beliefs?: No Depression: Yes Depression Symptoms: Insomnia;Tearfulness;Isolating;Fatigue;Loss of interest in usual pleasures;Feeling  angry/irritable Substance abuse history and/or treatment for substance abuse?: No Suicide prevention information given to non-admitted patients: Not applicable  Risk to Others Homicidal Ideation: No Thoughts of Harm to Others: No-Not Currently Present/Within Last 6 Months (pt reports feeling irritable wanting to pick fights) Current Homicidal Intent: No Current Homicidal Plan: No Access to Homicidal Means: No Identified Victim: none History of harm to others?: No Assessment of Violence: None Noted Violent Behavior Description: pt is calm and cooperative during assessment Does patient have access to weapons?: No Criminal Charges Pending?: No Does patient have a court date: No  Psychosis Hallucinations: Auditory;Visual (hears mother's voice; sees things on walls that is not there) Delusions: None noted  Mental Status Report Appear/Hygiene: Disheveled Eye Contact: Fair Motor Activity: Unremarkable Speech: Logical/coherent;Soft Level of Consciousness: Quiet/awake Mood: Depressed;Helpless Affect: Depressed Anxiety Level: Minimal Thought Processes: Coherent;Relevant Judgement: Impaired Orientation: Person;Place;Time;Situation Obsessive Compulsive Thoughts/Behaviors: None  Cognitive Functioning Concentration: Normal Memory: Recent Intact;Remote Intact IQ: Average Insight: Fair Impulse Control: Fair Appetite: Fair Weight Loss: 0  Weight Gain: 0  Sleep: Decreased Total Hours of Sleep: 4  (last 2 weeks- drinking to help him sleep) Vegetative Symptoms: None  ADLScreening [facility] Assessment Services) Patient's cognitive ability adequate to safely complete daily activities?: Yes Patient able to express need for assistance with ADLs?: Yes Independently performs ADLs?: Yes  Abuse/Neglect [facility]) Physical Abuse: Denies Verbal Abuse: Denies Sexual Abuse: Denies  Prior Inpatient Therapy Prior Inpatient Therapy: Yes Prior Therapy Dates: 2012 Prior Therapy Facilty/Provider(s):  [facility] Reason for Treatment: depression/SI  Prior Outpatient Therapy Prior Outpatient Therapy: No Prior Therapy Dates: none Prior Therapy Facilty/Provider(s): n/a Reason for Treatment: n/a  ADL Screening (condition at time of admission) Patient's cognitive ability adequate to safely complete daily activities?: Yes Patient able to express need for assistance with ADLs?: Yes Independently performs ADLs?: Yes       Abuse/Neglect Assessment (Assessment to be complete while patient is alone) Physical Abuse: Denies Verbal Abuse: Denies Sexual Abuse: Denies Values / Beliefs Cultural Requests During Hospitalization: None Spiritual Requests During Hospitalization: None        Additional Information 1:1 In Past 12 Months?: No CIRT Risk: No Elopement Risk: No Does patient have medical clearance?: Yes     Disposition:  Disposition Disposition of Patient: Inpatient treatment program;Referred to [facility]) Type of inpatient treatment program: Adult Patient referred to: Other (Comment) [facility])  On Site Evaluation by:   Reviewed with Physician:     N[place] Crane F 12/29/2011 10:16 PM

## 2011-12-29 NOTE — ED Provider Notes (Addendum)
History     CSN: [id number]  Arrival date & time 12/29/11  1514   First MD Initiated Contact with Patient 12/29/11 1545      Chief Complaint  Patient presents with  . Weakness    (Consider location/radiation/quality/duration/timing/severity/associated sxs/prior treatment) Patient is a 31 y.o. male presenting with weakness. The history is provided by the patient and medical records.  Weakness The primary symptoms include dizziness. Primary symptoms do not include headaches, fever, nausea or vomiting.  Dizziness also occurs with weakness. Dizziness does not occur with nausea or vomiting.  Additional symptoms include weakness.  he has hx of depression.  He drinks etoh occ. He denies smoking or using drugs.  He c/o weakness and dizziness and apathy. He says he does not care if he dies. He does not have an active plan to kill himself or anyone else.  He says everyone kicks him when he is down and treats him as if he were worthless.   He denies pain or recent illness.    Past Medical History  Diagnosis Date  . Mental disorder   . Hypertension   . Depression   . Bipolar 1 disorder   . Irregular heart rate     History reviewed. No pertinent past surgical history.  Family History  Problem Relation Age of Onset  . Hypertension Mother   . Hypertension Other     History  Substance Use Topics  . Smoking status: Former Smoker -- 0.5 packs/day  . Smokeless tobacco: Not on file  . Alcohol Use: Yes     Reported drank alcohol only once yesterday      Review of Systems  Constitutional: Negative for fever and chills.  HENT: Negative for congestion.   Respiratory: Negative for cough and shortness of breath.   Cardiovascular: Negative for chest pain.  Gastrointestinal: Negative for nausea, vomiting and abdominal pain.  Skin: Negative for rash.  Neurological: Positive for dizziness and weakness. Negative for headaches.  Psychiatric/Behavioral: Negative for suicidal ideas and  confusion.  All other systems reviewed and are negative.    Allergies  Review of patient's allergies indicates no known allergies.  Home Medications  No current outpatient prescriptions on file.  BP 150/86  Pulse 86  Temp 97.9 F (36.6 C) (Oral)  Resp 16  SpO2 100%  Physical Exam  Nursing note and vitals reviewed. Constitutional: He is oriented to person, place, and time. He appears well-developed and well-nourished. No distress.  HENT:  Head: Normocephalic and atraumatic.  Eyes: Conjunctivae are normal.  Neck: Normal range of motion. Neck supple.  Cardiovascular: Normal rate.   Murmur heard. Pulmonary/Chest: Effort normal and breath sounds normal.  Abdominal: Soft. Bowel sounds are normal.  Musculoskeletal: Normal range of motion.  Neurological: He is alert and oriented to person, place, and time.  Skin: Skin is warm and dry.  Psychiatric:       depressed    ED Course  Procedures (including critical care time) Depression. Not overtly suicidal.  Will do med screening and discuss with act.  Labs Reviewed - No data to display No results found.   No diagnosis found.  5:31 PM Act will see in ed for depression to provide outpt tx 11:54 PM Pt was seen by act and has been referred to [facility] for admission  ECG Normal sinus rhythm at 76 beats per minute. Left axis deviation. Left anterior fascicular block. Normal.  ST and T-wave  MDM  depression        Tinnie Gens  Whittney Steenson, MD 12/29/11 2354  Cheri Guppy, MD 01/27/12 [phone]

## 2011-12-29 NOTE — ED Notes (Signed)
Pt. Girlfriend at bedside requesting keys from pt.  Pt gave permission for girlfriend to take keys so lanyard was removed from belongings bag and given to girlfriend.

## 2011-12-29 NOTE — ED Notes (Addendum)
Pt states he has had generalized weakness x 2 days. States he has not been taking his psych meds because he has not had the money. Maybe be febrile. CBG 81. Pt states that he is depressed.

## 2011-12-29 NOTE — ED Notes (Signed)
Pt. Girlfriend left landyard in room and it was returned to pt. Belongings bag and placed in locker number 36

## 2011-12-29 NOTE — ED Notes (Signed)
Pt has been changed into [name] [name] scrubs and wanded by security. Pt has one belongings bag at nurses station

## 2011-12-30 ENCOUNTER — Encounter (HOSPITAL_COMMUNITY): Payer: Self-pay | Admitting: *Deleted

## 2011-12-30 ENCOUNTER — Inpatient Hospital Stay (HOSPITAL_COMMUNITY)
Admission: AD | Admit: 2011-12-30 | Discharge: 2012-01-06 | DRG: 885 | Disposition: A | Discharge status: Home / Self Care | Payer: Federal, State, Local not specified - Other | Source: Other Acute Inpatient Hospital | Attending: Psychiatry | Admitting: Psychiatry

## 2011-12-30 DIAGNOSIS — F32A Depression, unspecified: Secondary | ICD-10-CM | POA: Diagnosis present

## 2011-12-30 DIAGNOSIS — F10931 Alcohol use, unspecified with withdrawal delirium: Secondary | ICD-10-CM | POA: Diagnosis not present

## 2011-12-30 DIAGNOSIS — F1020 Alcohol dependence, uncomplicated: Secondary | ICD-10-CM | POA: Diagnosis present

## 2011-12-30 DIAGNOSIS — R51 Headache: Secondary | ICD-10-CM | POA: Diagnosis not present

## 2011-12-30 DIAGNOSIS — F323 Major depressive disorder, single episode, severe with psychotic features: Secondary | ICD-10-CM | POA: Diagnosis present

## 2011-12-30 DIAGNOSIS — I10 Essential (primary) hypertension: Secondary | ICD-10-CM | POA: Diagnosis present

## 2011-12-30 DIAGNOSIS — F329 Major depressive disorder, single episode, unspecified: Secondary | ICD-10-CM | POA: Diagnosis present

## 2011-12-30 DIAGNOSIS — F1010 Alcohol abuse, uncomplicated: Secondary | ICD-10-CM | POA: Diagnosis present

## 2011-12-30 DIAGNOSIS — F10231 Alcohol dependence with withdrawal delirium: Secondary | ICD-10-CM | POA: Diagnosis not present

## 2011-12-30 DIAGNOSIS — F333 Major depressive disorder, recurrent, severe with psychotic symptoms: Principal | ICD-10-CM | POA: Diagnosis present

## 2011-12-30 DIAGNOSIS — Z634 Disappearance and death of family member: Secondary | ICD-10-CM

## 2011-12-30 DIAGNOSIS — M5380 Other specified dorsopathies, site unspecified: Secondary | ICD-10-CM | POA: Diagnosis not present

## 2011-12-30 DIAGNOSIS — F79 Unspecified intellectual disabilities: Secondary | ICD-10-CM | POA: Diagnosis present

## 2011-12-30 MED ORDER — MAGNESIUM HYDROXIDE 400 MG/5ML PO SUSP: 30.0000 mL | Freq: Every day | ORAL | Status: DC | PRN

## 2011-12-30 MED ORDER — LORAZEPAM 1 MG PO TABS: 1.0000 mg | ORAL_TABLET | Freq: Three times a day (TID) | ORAL | Status: DC | PRN

## 2011-12-30 MED ORDER — DULOXETINE HCL 30 MG PO CPEP
30.0000 mg | ORAL_CAPSULE | Freq: Every day | ORAL | Status: DC
  Administered 2011-12-30 – 2012-01-03 (×5): 30 mg via ORAL
  Filled 2011-12-30 (×8): qty 1

## 2011-12-30 MED ORDER — ALUM & MAG HYDROXIDE-SIMETH 200-200-20 MG/5ML PO SUSP: 30.0000 mL | ORAL | Status: DC | PRN

## 2011-12-30 MED ORDER — ACETAMINOPHEN 325 MG PO TABS
650.0000 mg | ORAL_TABLET | Freq: Four times a day (QID) | ORAL | Status: DC | PRN
  Administered 2012-01-02 – 2012-01-04 (×3): 650 mg via ORAL

## 2011-12-30 MED ORDER — HYDROXYZINE HCL 25 MG PO TABS: 25.0000 mg | ORAL_TABLET | Freq: Three times a day (TID) | ORAL | Status: DC | PRN

## 2011-12-30 MED ORDER — IBUPROFEN 600 MG PO TABS: 600.0000 mg | ORAL_TABLET | Freq: Three times a day (TID) | ORAL | Status: DC | PRN

## 2011-12-30 MED ORDER — HYDROXYZINE HCL 25 MG PO TABS
25.0000 mg | ORAL_TABLET | Freq: Four times a day (QID) | ORAL | Status: DC | PRN
  Administered 2011-12-30: 50 mg via ORAL

## 2011-12-30 MED ORDER — QUETIAPINE FUMARATE 50 MG PO TABS
50.0000 mg | ORAL_TABLET | ORAL | Status: DC
  Administered 2011-12-30 – 2012-01-06 (×20): 50 mg via ORAL
  Filled 2011-12-30 (×4): qty 1
  Filled 2011-12-30: qty 56
  Filled 2011-12-30 (×3): qty 1
  Filled 2011-12-30: qty 56
  Filled 2011-12-30 (×6): qty 1
  Filled 2011-12-30 (×3): qty 56
  Filled 2011-12-30 (×5): qty 1
  Filled 2011-12-30: qty 56
  Filled 2011-12-30 (×4): qty 1

## 2011-12-30 MED ORDER — ACETAMINOPHEN 325 MG PO TABS: 650.0000 mg | ORAL_TABLET | ORAL | Status: DC | PRN

## 2011-12-30 NOTE — [facility] Suicide Risk Assessment (Signed)
Suicide Risk Assessment  Admission Assessment     Demographic factors:  Assessment Details Time of Assessment: Admission Information Obtained From: Patient Current Mental Status:  Current Mental Status: Self-harm thoughts Loss Factors:  Loss Factors: Loss of significant relationship Historical Factors:    Risk Reduction Factors:  Risk Reduction Factors: Employed  CLINICAL FACTORS:   Depression:   Hopelessness  COGNITIVE FEATURES THAT CONTRIBUTE TO RISK:  Closed-mindedness    SUICIDE RISK:   Moderate:  Frequent suicidal ideation with limited intensity, and duration, some specificity in terms of plans, no associated intent, good self-control, limited dysphoria/symptomatology, some risk factors present, and identifiable protective factors, including available and accessible social support.  PLAN OF CARE: Mental Status Examination/Evaluation:  Objective: Appearance: Fairly Groomed   Psychomotor Activity: Normal   Eye Contact:: Minimal   Speech: Clear and Coherent and Normal Rate   Volume: Normal   Mood: not good   Affect: Full Range   Thought Process: Somewhat clear rational goal oriented -wants to be taken care of   Orientation: Full   Thought Content: Denies AVH does have some paranoia   Suicidal Thoughts: yes  Homicidal Thoughts: No   Judgement: Poor   Insight: Shallow   DIAGNOSIS:  AXIS I  Depressive Disorder NOS   AXIS II  Mental retardation, severity unknown   AXIS III  See medical history.   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

## 2011-12-30 NOTE — Tx Team (Signed)
Initial Interdisciplinary Treatment Plan  PATIENT STRENGTHS: (choose at least two) Capable of independent living General fund of knowledge Work skills  PATIENT STRESSORS: Loss of mother    PROBLEM LIST: Problem List/Patient Goals Date to be addressed Date deferred Reason deferred Estimated date of resolution  Depressed with suicidal ideation 12/30/11                                                      DISCHARGE CRITERIA:  Ability to meet basic life and health needs Improved stabilization in mood, thinking, and/or behavior Verbal commitment to aftercare and medication compliance  PRELIMINARY DISCHARGE PLAN: Return to previous living arrangement Return to previous work or school arrangements  PATIENT/FAMIILY INVOLVEMENT: This treatment plan has been presented to and reviewed with the patient, [name] [name], [name] [name] 12/30/2011, 7:03 AM

## 2011-12-30 NOTE — [facility] Counselor (Signed)
Pt has been accepted at [facility]. EDP notified and agrees with plan. Support paperwork has been signed and faxed to [facility].

## 2011-12-30 NOTE — Progress Notes (Signed)
[facility] Group Notes:  (Counselor/Nursing/MHT/Case Management/Adjunct)  12/30/2011 5:08 PM  Type of Therapy:  Psychoeducational Skills  Participation Level:  Did Not Attend  [name] [name] 12/30/2011, 5:08 PM

## 2011-12-30 NOTE — Progress Notes (Signed)
Patient ID: JOHNEDWARD BRODRICK, male   DOB: March 24, 1981, 31 y.o.   MRN: [id number]  [facility] Group Notes:  (Counselor/Nursing/MHT/Case Management/Adjunct)  12/30/2011 1:15 PM  Type of Therapy:  Group Therapy, Dance/Movement Therapy   Participation Level:  Did Not Attend     Rhunette Croft 12/30/2011. 3:57 PM

## 2011-12-30 NOTE — H&P (Signed)
Psychiatric Admission Assessment Adult  Patient Identification:  [name] [name] Date of Evaluation:  12/30/2011 31yo SWM CC: presented to ED dizzy and apathy. Said he didn't care if he died.  History of Present Illness: Feels everyone kicks him when he is down and teats him as if he were worthless. Has reported AH when here before but denies at this time. Has been non-compliant with meds since his mom passed November 2012.    Past Psychiatric History: Several admissions here last June 2012 Wants to get back into care at [facility]    Substance Abuse History:  Social History:    reports that he has quit smoking. He does not have any smokeless tobacco history on file. He reports that he drinks about 21 ounces of alcohol per week. He reports that he does not use illicit drugs. Went to 8th grade. Has a 75 yo son . 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  Family Psych History: Denies   Past Medical History:     Past Medical History  Diagnosis Date  . Mental disorder   . Hypertension   . Depression   . Bipolar 1 disorder   . Irregular heart rate       No past surgical history on file.  Allergies: No Known Allergies  Current Medications:  Prior to Admission medications   Not on File    Mental Status Examination/Evaluation: Objective:  Appearance: Fairly Groomed  Psychomotor Activity:  Normal  Eye Contact::  Minimal  Speech:  Clear and Coherent and Normal Rate  Volume:  Normal  Mood: not as depressed as when seen before    Affect:  Full Range  Thought Process:  Somewhat clear rational goal oriented -wants to be taken care of   Orientation:  Full  Thought Content:  Denies AVH does have some paranoia  Suicidal Thoughts:  No  Homicidal Thoughts:  No  Judgement:  Poor  Insight:  Shallow    DIAGNOSIS:    AXIS I Depressive Disorder NOS  AXIS II Mental retardation, severity unknown  AXIS III See medical history.  AXIS IV educational problems, other  psychosocial or environmental problems and problems with primary support group  AXIS V 51-60 moderate symptoms     Treatment Plan Summary: Admit for safety& stabilization Restart meds Cymbalta & Seroquel Case manager -make sure he calls in so he doesn't loose his job.\ Agree with H&P from [facility]

## 2011-12-30 NOTE — H&P (Signed)
  Pt was seen by me today and I agree with the key elements documented in H&P.  

## 2011-12-30 NOTE — Progress Notes (Signed)
Patient ID: [name] [name], male   DOB: Jul 17, 1980, 31 y.o.   MRN: [id number] This is a vol. admission of a 31 y.o. s/w/m  With a Dx of MDD Recurrent, Severe with Psychotic Features. The patient came to the ED with a c/o generalized fatigue and weakness. States his mother died in 22-May-2023 and stopped taking his medications around the same time. C/O not sleeping more than 4 hours a night and feeling depressed. Started drinking at night to feel better and to help him sleep. Reports he has been drinking between 5-6 beers per night for the past few weeks. No signs or symptoms of withdrawal noted. CIWA = 0. H/O HTN and an irregular heart beat. Appears limited and is unable to read well. States he has had very little schooling. 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 Although he has a sister in [place], he reports that he has no family support system. The patient has been having suicidal thoughts with no plan. He is able to contract for safety. He stated that he has been having auditory hallucinations over the past few days hearing his mother calling to him.

## 2011-12-30 NOTE — Progress Notes (Signed)
Psychoeducational Group Note  Date:  12/30/2011 Time:  1015  Group Topic/Focus:  Identifying Needs:   The focus of this group is to help patients identify their personal needs that have been historically problematic and identify healthy behaviors to address their needs.  Participation Level:  None  Participation Quality:  Resistant  Affect:  Blunted and Depressed  Cognitive:  Appropriate  Insight:  Limited  Engagement in Group:  None  Additional Comments:  Sad and flat. Interacted with another peer minimallly. Support offered after group  Cresenciano Lick 12/30/2011, 2:16 PM

## 2011-12-30 NOTE — ED Provider Notes (Signed)
[name] [name], ACT states accepted at [facility] by Dr Herminio Commons, MD 12/30/11 [phone]

## 2011-12-31 NOTE — Progress Notes (Signed)
Psychoeducational Group Note  Date:  12/31/2011 Time:  0930   Group Topic/Focus:  Spirituality:   The focus of this group is to discuss how one's spirituality can aide in recovery.  Participation Level:  None  Participation Quality:  Inattentive  Affect:  Blunted and Depressed  Cognitive:  Alert and Oriented  Insight:  None  Engagement in Group:  None  Additional Comments:  Pt attended morning spirituality group, but left abruptly after group leader asked the group to identify one positive part of their day. This group focused on spirituality as an aspect of wellness.  Orma Render 12/31/2011, 10:56 AM

## 2011-12-31 NOTE — Progress Notes (Signed)
D-Patient is quiet and isolative to room but attending groups. A- Drowsy and inattentive despite encouragement for group participation. Rates depression and hopelessness at 7. "Off and on" SI and contracts for safety. States "I dont't know yet" about changes at d/c and has minimal insight into wellness plan.  Compliant with scheduled medications and no prn's requested. R- Continue support and encouragement.  Continue current POC and evaluation of treatment goals. Continue 15' checks for safety.

## 2011-12-31 NOTE — Progress Notes (Signed)
  [name] [name] is a 31 y.o. male [id number] 1981-05-04  12/30/2011 Principal Problem:  *Depression, psychotic Active Problems:  Depression  Hypertension   Mental Status:  Mood is depressed denies active SI/HI/AVH  Subjective/Objective: Wants to be taken care of. Encouraged to resume care at [facility]. He has not called his employer yet.    Filed Vitals:   12/31/11 0701  BP: 105/70  Pulse: 101  Temp: 97.6 F (36.4 C)  Resp: 18    Lab Results:   BMET    Component Value Date/Time   NA 140 12/29/2011 1634   K 3.7 12/29/2011 1634   CL 106 12/29/2011 1634   CO2 26 12/29/2011 1634   GLUCOSE 86 12/29/2011 1634   BUN 9 12/29/2011 1634   CREATININE 0.75 12/29/2011 1634   CALCIUM 9.2 12/29/2011 1634   GFRNONAA >90 12/29/2011 1634   GFRAA >90 12/29/2011 1634    Medications:  Scheduled:     . DULoxetine  30 mg Oral Daily  . QUEtiapine  50 mg Oral [id number]     PRN Meds acetaminophen, alum & mag hydroxide-simeth, hydrOXYzine, magnesium hydroxide, DISCONTD: hydrOXYzine  Plan: no med changes today          Case manager to help  identify support opportunities for him.  Saima Monterroso,MICKIE D. 12/31/2011

## 2011-12-31 NOTE — Progress Notes (Signed)
Patient observed in hallway before group and appeared anxious almost tearful. Patient had received a prn of visteril an hour before shift change. Writer spoke with patient and inquired as to how he felt. Patient reported the visteril had helped a little but he felt nervous going to group. Patient was encouraged to attempt attending group and if it was too overwhelming then he could leave. Patient reports that he still hears the voices but not all the time. Support and encouragement offered, denies pain at this time, -si/hi and visual hallucination. Safety maintained on unit, will continue to monitor.

## 2011-12-31 NOTE — Progress Notes (Signed)
[facility] Group Notes:  (Counselor/Nursing/MHT/Case Management/Adjunct)  12/31/2011 1:11 AM  Type of Therapy:  Psychoeducational Skills  Participation Level:  None  Participation Quality:  Resistant  Affect:  Depressed  Cognitive:  Alert  Insight:  None  Engagement in Group:  None  Engagement in Therapy:  None  Modes of Intervention:  Education  Summary of Progress/Problems: The pt. Attended group this evening, but would not share any of his feelings.     Anderia Lorenzo S 12/31/2011, 1:11 AM

## 2011-12-31 NOTE — Progress Notes (Signed)
[facility] Group Notes:  (Counselor/Nursing/MHT/Case Management/Adjunct)  12/31/2011 6:41 PM  Type of Therapy:  Group Therapy  Participation Level:  Active  Participation Quality:  Appropriate and Attentive  Affect:  Appropriate  Cognitive:  Appropriate  Insight:  Good  Engagement in Group:  Good  Engagement in Therapy:  Good  Modes of Intervention:  Clarification, Education, Socialization and Support  Summary of Progress/Problems: Pt. participated in group on health support systems and what support means to the. Each pt. Was encouraged to find health supports and to reach out if they did not have many supports. Each members was encouraged to attend support groups and each pt. participated in a support activity at the end of group.  Pt. Left group early and arrived late. Pt. Did not share much. But did mention support meaning someone being there for you.   Lamar Blinks [place] 12/31/2011, 6:41 PM

## 2011-12-31 NOTE — Progress Notes (Signed)
Psychoeducational Group Note  Date:  12/31/2011 Time:  1015  Group Topic/Focus:  Making Healthy Choices:   The focus of this group is to help patients identify negative/unhealthy choices they were using prior to admission and identify positive/healthier coping strategies to replace them upon discharge.  Participation Level:  None  Participation Quality:  Drowsy  Affect:  Blunted  Cognitive:  Oriented  Insight:  None  Engagement in Group:  None  Additional Comments: Slept through group even with redirection.  Cresenciano Lick 12/31/2011, 4:33 PM

## 2011-12-31 NOTE — Progress Notes (Signed)
Psychoeducational Group Note  Date:  12/31/2011 Time:  1515  Group Topic/Focus:  Conflict Resolution:   The focus of this group is to discuss the conflict resolution process and how it may be used upon discharge.  Participation Level:  Did Not Attend  Participation Quality:    Affect:    Cognitive:    Insight:    Engagement in Group:    Additional Comments:  Pt was sleep  [name] [name] M 12/31/2011, 4:45 PM

## 2012-01-01 DIAGNOSIS — F332 Major depressive disorder, recurrent severe without psychotic features: Secondary | ICD-10-CM

## 2012-01-01 DIAGNOSIS — F1010 Alcohol abuse, uncomplicated: Secondary | ICD-10-CM | POA: Diagnosis present

## 2012-01-01 DIAGNOSIS — Z634 Disappearance and death of family member: Secondary | ICD-10-CM

## 2012-01-01 MED ORDER — CHLORDIAZEPOXIDE HCL 25 MG PO CAPS
25.0000 mg | ORAL_CAPSULE | Freq: Four times a day (QID) | ORAL | Status: AC | PRN
  Administered 2012-01-01: 25 mg via ORAL
  Filled 2012-01-01: qty 1

## 2012-01-01 MED ORDER — THIAMINE HCL 100 MG/ML IJ SOLN
100.0000 mg | Freq: Once | INTRAMUSCULAR | Status: AC
  Administered 2012-01-01: 22:00:00 via INTRAMUSCULAR

## 2012-01-01 MED ORDER — ADULT MULTIVITAMIN W/MINERALS CH
1.0000 | ORAL_TABLET | Freq: Every day | ORAL | Status: DC
  Administered 2012-01-01 – 2012-01-06 (×6): 1 via ORAL
  Filled 2012-01-01 (×2): qty 1
  Filled 2012-01-01: qty 14
  Filled 2012-01-01 (×2): qty 1
  Filled 2012-01-01: qty 14
  Filled 2012-01-01 (×2): qty 1

## 2012-01-01 MED ORDER — LOPERAMIDE HCL 2 MG PO CAPS: 2.0000 mg | ORAL_CAPSULE | ORAL | Status: AC | PRN

## 2012-01-01 MED ORDER — VITAMIN B-1 100 MG PO TABS
100.0000 mg | ORAL_TABLET | Freq: Every day | ORAL | Status: DC
  Administered 2012-01-02 – 2012-01-06 (×5): 100 mg via ORAL
  Filled 2012-01-01 (×3): qty 1
  Filled 2012-01-01 (×2): qty 14
  Filled 2012-01-01 (×2): qty 1

## 2012-01-01 MED ORDER — GABAPENTIN 300 MG PO CAPS
300.0000 mg | ORAL_CAPSULE | Freq: Three times a day (TID) | ORAL | Status: DC
  Administered 2012-01-03 – 2012-01-06 (×13): 300 mg via ORAL
  Filled 2012-01-01: qty 1
  Filled 2012-01-01: qty 56
  Filled 2012-01-01: qty 1
  Filled 2012-01-01: qty 56
  Filled 2012-01-01: qty 1
  Filled 2012-01-01 (×2): qty 56
  Filled 2012-01-01: qty 1
  Filled 2012-01-01: qty 56
  Filled 2012-01-01 (×2): qty 1
  Filled 2012-01-01: qty 56
  Filled 2012-01-01 (×5): qty 1
  Filled 2012-01-01 (×2): qty 56
  Filled 2012-01-01 (×2): qty 1

## 2012-01-01 MED ORDER — GABAPENTIN 100 MG PO CAPS
100.0000 mg | ORAL_CAPSULE | Freq: Three times a day (TID) | ORAL | Status: AC
  Administered 2012-01-01 – 2012-01-02 (×2): 100 mg via ORAL
  Filled 2012-01-01 (×3): qty 1

## 2012-01-01 MED ORDER — ONDANSETRON 4 MG PO TBDP
4.0000 mg | ORAL_TABLET | Freq: Four times a day (QID) | ORAL | Status: AC | PRN
Start: 2012-01-01 — End: 2012-01-04

## 2012-01-01 MED ORDER — GABAPENTIN 100 MG PO CAPS
200.0000 mg | ORAL_CAPSULE | Freq: Three times a day (TID) | ORAL | Status: AC
  Administered 2012-01-02 – 2012-01-03 (×4): 200 mg via ORAL
  Filled 2012-01-01 (×4): qty 2
  Filled 2012-01-01: qty 1

## 2012-01-01 NOTE — Progress Notes (Addendum)
[facility] MD Progress Note  01/01/2012 5:35 PM  Diagnosis:   Axis I: Major Depression, Recurrent severe Axis II: Deferred Axis III:  Past Medical History  Diagnosis Date  . Mental disorder   . Hypertension   . Depression   . Bipolar 1 disorder   . Irregular heart rate    Axis IV: other psychosocial or environmental problems Axis V: 41-50 serious symptoms  ADL's:  Intact  Sleep: Fair  Appetite:  Good  Suicidal Ideation:  Pt reports some suicidal thoughts and feels worthless. Contracts for safety. Homicidal Ideation:  Pt denies any thoughts, plans, intent of homicide  Mental Status Examination/Evaluation: Objective:  Appearance: Casual  Eye Contact::  Good, has Left eye which gazes to the left more. He sees double.  Speech:  Clear and Coherent  Volume:  Normal  Mood:  Anxious, Depressed, Hopeless, Irritable and Worthless  Affect:  Congruent  Thought Process:  Coherent, Intact and Logical  Orientation:  Full  Thought Content:  WDL  Suicidal Thoughts:  Yes.  without intent/plan  Homicidal Thoughts:  No  Memory:  Immediate;   Fair Recent;   Fair Remote;   Fair  Judgement:  Impaired  Insight:  Fair  Psychomotor Activity:  Normal  Concentration:  Fair  Recall:  Fair  Akathisia:  No  Handed:  Left  AIMS (if indicated):     Assets:  Communication Skills Desire for Improvement  Sleep:  Number of Hours: 6.75    ROS: Neuro: some headaches, ataxia, weakness, could be withdrawal.  GI: no V/D/cramps/constipation, but some nausea. Could be withdrawal.  MS: no weakness, muscle cramps, aches.  Vital Signs:Blood pressure 96/62, pulse 108, temperature 98.4 F (36.9 C), temperature source Oral, resp. rate 18, height 6\' 1"  (1.854 m), weight 120.657 kg (266 lb). Current Medications: Current Facility-Administered Medications  Medication Dose Route Frequency Provider Last Rate Last Dose  . acetaminophen (TYLENOL) tablet 650 mg  650 mg Oral Q6H PRN Larena Sox, MD      .  alum & mag hydroxide-simeth (MAALOX/MYLANTA) 200-200-20 MG/5ML suspension 30 mL  30 mL Oral Q4H PRN Larena Sox, MD      . chlordiazePOXIDE (LIBRIUM) capsule 25 mg  25 mg Oral Q6H PRN Mike Craze, MD      . DULoxetine (CYMBALTA) DR capsule 30 mg  30 mg Oral Daily Mickie D. Adams, PA   30 mg at 01/01/12 0845  . gabapentin (NEURONTIN) capsule 100 mg  100 mg Oral TID AC & HS Mike Craze, MD       Followed by  . gabapentin (NEURONTIN) capsule 200 mg  200 mg Oral TID AC & HS Mike Craze, MD       Followed by  . gabapentin (NEURONTIN) capsule 300 mg  300 mg Oral TID AC & HS Mike Craze, MD      . hydrOXYzine (ATARAX/VISTARIL) tablet 25-50 mg  25-50 mg Oral Q6H PRN Verne Spurr, PA-C   50 mg at 12/30/11 1834  . loperamide (IMODIUM) capsule 2-4 mg  2-4 mg Oral PRN Mike Craze, MD      . magnesium hydroxide (MILK OF MAGNESIA) suspension 30 mL  30 mL Oral Daily PRN Larena Sox, MD      . multivitamin with minerals tablet 1 tablet  1 tablet Oral Daily Mike Craze, MD      . ondansetron (ZOFRAN-ODT) disintegrating tablet 4 mg  4 mg Oral Q6H PRN Mike Craze, MD      .  QUEtiapine (SEROQUEL) tablet 50 mg  50 mg Oral [id number] Mickie D. Adams, PA   50 mg at 01/01/12 1703  . thiamine (B-1) injection 100 mg  100 mg Intramuscular Once Mike Craze, MD      . thiamine (VITAMIN B-1) tablet 100 mg  100 mg Oral Daily Mike Craze, MD        Lab Results: No results found for this or any previous visit (from the past 48 hour(s)).  Physical Findings: AIMS: Facial and Oral Movements Muscles of Facial Expression: None, normal Lips and Perioral Area: None, normal Jaw: None, normal Tongue: None, normal,Extremity Movements Upper (arms, wrists, hands, fingers): None, normal Lower (legs, knees, ankles, toes): None, normal, Trunk Movements Neck, shoulders, hips: None, normal, Overall Severity Severity of abnormal movements (highest score from questions above): None,  normal Incapacitation due to abnormal movements: None, normal Patient's awareness of abnormal movements (rate only patient's report): No Awareness, Dental Status Current problems with teeth and/or dentures?: No Does patient usually wear dentures?: No  CIWA:  CIWA-Ar Total: 0  COWS:     Treatment Plan Summary: Daily contact with patient to assess and evaluate symptoms and progress in treatment Medication management Mood/anxiety less than 3/10 where the scale is 1 is the best and 10 is the worst No suicidal or homicidal thoughts for at least 48 hours.  Plan: Continue current treatment plan.  Encourage pt to draw a mother's day card for his deceased mother.  He had wanted to say that he loved her and had not had a chance to do so for several weeks prior to her demise in November.   Start AA meetings for his drinking.  Start detox protocol with excalating doses of Neurontin, which may help his anxiety too.  Ledarrius Beauchaine 01/01/2012, 5:35 PM

## 2012-01-01 NOTE — Progress Notes (Signed)
Writer observed patient lying in bed resting, reporting that he has felt dizzy and light headed most of the day. Writer encouraged patient to continue to rest and given gatorade. Patient reports he continues to have auditory hallucinations off and on. Patient encouraged to speak to his doctor to see if his medications could possibly cause him to feel this way. Patient denies si/hi and visual hall. Safety maintained on unit will continue to monitor.

## 2012-01-01 NOTE — Progress Notes (Addendum)
Date: 01/01/2012        Time: 1145       Group Topic/Focus: Patient invited to participate in animal assisted therapy. Pets as a coping skill and responsibility were discussed.  Participation Level: Active  Participation Quality: Redirectable  Affect: Depressed  Cognitive: Alert  Additional Comments: Patient participated with encouragement from RT, as animal assisted therapy is an activity he has enjoyed during previous admissions. Patient remained very sad in affect, but did smile briefly prior to the end of the visit.   [name] [name] 01/01/2012 12:16 PM

## 2012-01-01 NOTE — Progress Notes (Signed)
[facility] Group Notes:  (Counselor/Nursing/MHT/Case Management/Adjunct) 01/01/2012  1:15pm-2:30pm Overcoming Obstacles to Wellness   Type of Therapy:  Group Therapy  Participation Level:  Did Not Attend - Reason unknown     [name], LCSW 01/01/2012  3:01 PM

## 2012-01-01 NOTE — Progress Notes (Signed)
Psychoeducational Group Note  Date:  01/01/2012 Time:  2000  Group Topic/Focus:  Wrap-Up Group:   The focus of this group is to help patients review their daily goal of treatment and discuss progress on daily workbooks.  Participation Level:  Minimal  Participation Quality:  Drowsy  Affect:  Appropriate  Cognitive:  Appropriate  Insight:  Good  Engagement in Group:  Limited  Additional Comments:  Patient attended and participated in wrap-up group this evening. Patient stated that he had been sleeping all day but that he did intend on setting a goal for himself tomorrow.  Jaretzy Lhommedieu, Newton Pigg 01/01/2012, 8:42 PM

## 2012-01-01 NOTE — Progress Notes (Signed)
Psychoeducational Group Note  Date:  01/01/2012 Time:  2000  Group Topic/Focus:  Wrap-Up Group:   The focus of this group is to help patients review their daily goal of treatment and discuss progress on daily workbooks.  Participation Level: Did Not Attend  Participation Quality:  Not Applicable  Affect:  Not Applicable  Cognitive:  Not Applicable  Insight:  Not Applicable  Engagement in Group: Not Applicable  Additional Comments:  The pt. Did not feel well enough to attend group and returned to sleep.  He later explained to this Thereasa Parkin that he was feeling dizzy and weak.   Prezley Qadir S 01/01/2012, 12:10 AM
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
## 2012-01-01 NOTE — Progress Notes (Signed)
7am-7pm D: Patient observed resting in bed. Patient denies SI/HI states he has not had any SI since this morning. Pt seems sad and is not very talkative. Patient rates his depression at a 7 and hopeless is also a 7.Patient also denies any A/V hallunications A: Pt encourage to find nurse and talk if he starts to have SI/HI. Pt restarted on Seroquel today. R: Patient is in room and is safe q15 min checks.

## 2012-01-02 DIAGNOSIS — F323 Major depressive disorder, single episode, severe with psychotic features: Secondary | ICD-10-CM

## 2012-01-02 NOTE — Progress Notes (Signed)
[facility] MD Progress Note  01/02/2012 1:04 PM  S: "I am trying to deal with all the stressors in my life. It was difficult to cope with my mother's death. She died last 05-31-23. As I try to tough all my hard feelings out, I did not realize that I was being eaten up inside out.  So, I started to drink too much alcohol. It helped me to forget some my pains too. I still mom and I hear mom too. It is weird. She appear so real, but will just go away in a flash".  Diagnosis:   Axis I: Major depressive disorder, severe with psychotic features. Axis II: Deferred Axis III:  Past Medical History  Diagnosis Date  . Mental disorder   . Hypertension   . Depression   . Bipolar 1 disorder   . Irregular heart rate    Axis IV: Bereavement Axis V: 51-60 moderate symptoms  ADL's:  Intact  Sleep: Good  Appetite:  Good  Suicidal Ideation:  Plan:  No Intent:  No Means:  No' Homicidal Ideation:  Plan:  No Intent:  no Means:  No  AEB (as evidenced by): Per patient's reports.  Mental Status Examination/Evaluation: Objective:  Appearance: Casual  Eye Contact::  Good  Speech:  Clear and Coherent  Volume:  Normal  Mood:  Depressed  Affect:  Flat  Thought Process:  Coherent  Orientation:  Full  Thought Content:  Rumination  Suicidal Thoughts:  Yes.  without intent/plan  Homicidal Thoughts:  No  Memory:  Immediate;   Good Recent;   Good Remote;   Good  Judgement:  Fair  Insight:  Fair  Psychomotor Activity:  Normal  Concentration:  Good  Recall:  Good  Akathisia:  No  Handed:  Right  AIMS (if indicated):     Assets:  Desire for Improvement  Sleep:  Number of Hours: 6.75    Vital Signs:Blood pressure 107/69, pulse 91, temperature 97.4 F (36.3 C), temperature source Oral, resp. rate 16, height 6\' 1"  (1.854 m), weight 120.657 kg (266 lb). Current Medications: Current Facility-Administered Medications  Medication Dose Route Frequency Provider Last Rate Last Dose  . acetaminophen  (TYLENOL) tablet 650 mg  650 mg Oral Q6H PRN Larena Sox, MD   650 mg at 01/02/12 0941  . alum & mag hydroxide-simeth (MAALOX/MYLANTA) 200-200-20 MG/5ML suspension 30 mL  30 mL Oral Q4H PRN Larena Sox, MD      . chlordiazePOXIDE (LIBRIUM) capsule 25 mg  25 mg Oral Q6H PRN Mike Craze, MD   25 mg at 01/01/12 2133  . DULoxetine (CYMBALTA) DR capsule 30 mg  30 mg Oral Daily Mickie D. Adams, PA   30 mg at 01/02/12 0809  . gabapentin (NEURONTIN) capsule 100 mg  100 mg Oral TID AC & HS Mike Craze, MD   100 mg at 01/02/12 0700   Followed by  . gabapentin (NEURONTIN) capsule 200 mg  200 mg Oral TID AC & HS Mike Craze, MD   200 mg at 01/02/12 1154   Followed by  . gabapentin (NEURONTIN) capsule 300 mg  300 mg Oral TID AC & HS Mike Craze, MD      . hydrOXYzine (ATARAX/VISTARIL) tablet 25-50 mg  25-50 mg Oral Q6H PRN Verne Spurr, PA-C   50 mg at 12/30/11 1834  . loperamide (IMODIUM) capsule 2-4 mg  2-4 mg Oral PRN Mike Craze, MD      . magnesium hydroxide (MILK OF  MAGNESIA) suspension 30 mL  30 mL Oral Daily PRN Larena Sox, MD      . multivitamin with minerals tablet 1 tablet  1 tablet Oral Daily Mike Craze, MD   1 tablet at 01/02/12 0809  . ondansetron (ZOFRAN-ODT) disintegrating tablet 4 mg  4 mg Oral Q6H PRN Mike Craze, MD      . QUEtiapine (SEROQUEL) tablet 50 mg  50 mg Oral [id number] Mickie D. Adams, PA   50 mg at 01/02/12 0809  . thiamine (B-1) injection 100 mg  100 mg Intramuscular Once Mike Craze, MD      . thiamine (VITAMIN B-1) tablet 100 mg  100 mg Oral Daily Mike Craze, MD   100 mg at 01/02/12 0809    Lab Results: No results found for this or any previous visit (from the past 48 hour(s)).  Physical Findings: AIMS: Facial and Oral Movements Muscles of Facial Expression: None, normal Lips and Perioral Area: None, normal Jaw: None, normal Tongue: None, normal,Extremity Movements Upper (arms, wrists, hands, fingers): None, normal Lower  (legs, knees, ankles, toes): None, normal, Trunk Movements Neck, shoulders, hips: None, normal, Overall Severity Severity of abnormal movements (highest score from questions above): None, normal Incapacitation due to abnormal movements: None, normal Patient's awareness of abnormal movements (rate only patient's report): No Awareness, Dental Status Current problems with teeth and/or dentures?: No Does patient usually wear dentures?: No  CIWA:  CIWA-Ar Total: 0  COWS:     Treatment Plan Summary: Daily contact with patient to assess and evaluate symptoms and progress in treatment Medication management  Plan: Continue current treatment plan.   Armandina Stammer I 01/02/2012, 1:04 PM

## 2012-01-02 NOTE — Progress Notes (Signed)
D: Pt denies SI/HI/AV. Per report pt was having some auditory hallucinations hearing his Mothers voice. Pt is pleasant and cooperative. Pt stated he has not had a good day because he feel she can not shake this depression.   A: Pt was offered support and encouragement. Pt was given scheduled medications. Pt was encourage to attend groups. Q 15 minute checks were done for safety.  R:Pt attends groups and interacts well with peers and staff. Pt taking medication. Pt has no complaints.Pt receptive to treatment and safety maintained on unit.

## 2012-01-02 NOTE — Progress Notes (Signed)
Psychoeducational Group Note  Date:  01/02/2012 Time:  1100  Group Topic/Focus:  Recovery Goals:   The focus of this group is to identify appropriate goals for recovery and establish a plan to achieve them.  Participation Level:  Did Not Attend   Additional Comments: Pt attempted to attend group for the last couple of minutes but pt was asleep majority of this group.   Sharyn Lull 01/02/2012, 12:07 PM

## 2012-01-02 NOTE — Progress Notes (Signed)
Chaplain's Note / Grief Loss Group  The group focused primarily on loss as related to relationships. The group members talked about grief over deaths of key family members who had been sources of support and affirmation, and when multiple family members died close in time how they felt the loss of family as they had known it. There was good support of each other though not a lot of verbal interaction.   Pt. Was somewhat attentive, but also seemed a bit disengaged at times.   [name] [name] [phone]

## 2012-01-02 NOTE — Progress Notes (Signed)
[facility] Adult Inpatient Family/Significant Other Suicide Prevention Education  Suicide Prevention Education:  Patient Refusal for Family/Significant Other Suicide Prevention Education: The patient [name] [name] has refused to provide written consent for family/significant other to be provided Family/Significant Other Suicide Prevention Education during admission and/or prior to discharge.  Physician notified.  [name] reported that he does not believe his sister would be open to contact at this time and does not want her or anyone else called. He stated that since his mother's death he does not have much of a support system. Counselor provided him with suicide prevention information and reviewed with him the information therein. He verbalized understanding and had no questions.  [name] 01/02/2012, 2:48 PM

## 2012-01-02 NOTE — Progress Notes (Signed)
[facility] Group Notes:  (Counselor/Nursing/MHT/Case Management/Adjunct)  01/02/2012 11:24 PM  Type of Therapy:  Psychoeducational Skills  Participation Level:  Did Not Attend  Participation Quality:  Appropriate and Resistant  Affect:  Appropriate and Flat  Cognitive:  Appropriate  Insight:  None  Engagement in Group:  None  Engagement in Therapy:  None  Modes of Intervention:  Problem-solving  Summary of Progress/Problems:Pt did not attend group.   Troyce Febo L 01/02/2012, 11:24 PM

## 2012-01-02 NOTE — Progress Notes (Addendum)
[facility] Group Notes:  (Counselor/Nursing/MHT/Case Management/Adjunct) 01/02/2012  1:15pm-2:30pm [facility] in Life   Type of Therapy:  Group Therapy  Participation Level:  Did Not Attend     [name], LCSW 01/02/2012  2:47 PM

## 2012-01-02 NOTE — Tx Team (Signed)
Interdisciplinary Treatment Plan Update (Adult)  Date:  01/02/2012  Time Reviewed:  4:05 PM   Progress in Treatment: Attending groups: Yes Participating in groups:  Yes Taking medication as prescribed:  Yes Tolerating medication: Yes Family/Significant othe contact made:  No, [name] [name] reports there is no one who can be contacted Patient understands diagnosis: Yes Discussing patient identified problems/goals with staff:  Yes Medical problems stabilized or resolved: Yes Denies suicidal/homicidal ideation: No, having on and off thoughts of suicide Issues/concerns per patient self-inventory:  No  Other:  New problem(s) identified: None  Reason for Continuation of Hospitalization: Anxiety Depression Medication stabilization Suicidal ideation  Interventions implemented related to continuation of hospitalization:  Medication stabilization, safety checks q 15 mins, group attendance  Additional comments:  Estimated length of stay: 3 days  Discharge Plan: Discharge home and follow up with [facility]  New goal(s):  Review of initial/current patient goals per problem list:   1.  Goal(s): Decrease depressive symptoms to rating of 4 or less  Met:  No  Target date: by d/c  As evidenced by: [name] [name] rates depression at 9  2.  Goal (s): Decrease anxiety symptoms to rating of 4 or less  Met:  No  Target date: by d/c   As evidenced by: [name] [name] rates anxiety at 8  3.  Goal(s): Reduce potential for self-harm/suicide  Met:  No  Target date: by d/c  As evidenced by: [name] [name] reports on and off suicidal thoughts  4.  Goal(s): Medication stabilization  Met:  No  Target date: by d/c  As evidenced by: Dr continues to work on stabilization of medicines  Attendees: Patient:     Family:     Physician:      Nursing:   Cammy Brochure, Rn 01/02/2012 4:05 PM  Case Manager:  Juline Patch, LCSW 01/02/2012 4:05 PM  Counselor:  [name], LCSW 01/02/2012 4:05 PM  Other:   Reyes Ivan, LCSWA 01/02/2012 4:05 PM  Other:  Neill Loft, RN 01/02/2012 4:05 PM  Other:  Serena Colonel, NP 01/02/2012 4:05 PM  Other:      Scribe for Treatment Team:   [name], 01/02/2012 4:05 PM

## 2012-01-02 NOTE — Progress Notes (Signed)
Patient ID: [name] [name], male   DOB: 1980/11/26, 31 y.o.   MRN: [id number] D-Patient reports fair sleep and improving appetite.  His energy level is low and his ability to pay attention is improving.  He says that he continues to hear his mother's voice at times.  Says he knows this happens when he feels stressed.  He has been attending groups.  He had a headache this am and it was relieved with tylenol. A- Supported and encouraged patient.  Pt receptive, says his anxiety is high a lot and rates it a 7.

## 2012-01-02 NOTE — Discharge Planning (Signed)
Pt was seen this morning during discharge planning group. Pt stated that he was not sleeping well due to the racing thoughts about his mother that pasted away nine months ago. Pt rated his depression level at a 7 and his anxiety level at a seven also. Pt endorses active SI, yet he was able to contract for safety. CM will assist pt in aftercare planning.

## 2012-01-03 DIAGNOSIS — Z634 Disappearance and death of family member: Secondary | ICD-10-CM

## 2012-01-03 DIAGNOSIS — F1010 Alcohol abuse, uncomplicated: Secondary | ICD-10-CM

## 2012-01-03 MED ORDER — CHLORDIAZEPOXIDE HCL 25 MG PO CAPS
25.0000 mg | ORAL_CAPSULE | Freq: Every day | ORAL | Status: AC
  Administered 2012-01-05: 25 mg via ORAL
  Filled 2012-01-03 (×2): qty 1

## 2012-01-03 MED ORDER — CHLORDIAZEPOXIDE HCL 25 MG PO CAPS
25.0000 mg | ORAL_CAPSULE | ORAL | Status: AC
  Administered 2012-01-03: 25 mg via ORAL
  Filled 2012-01-03: qty 1

## 2012-01-03 MED ORDER — CHLORDIAZEPOXIDE HCL 25 MG PO CAPS
25.0000 mg | ORAL_CAPSULE | Freq: Two times a day (BID) | ORAL | Status: AC
  Administered 2012-01-04 (×2): 25 mg via ORAL
  Filled 2012-01-03: qty 1

## 2012-01-03 MED ORDER — DULOXETINE HCL 30 MG PO CPEP
30.0000 mg | ORAL_CAPSULE | Freq: Two times a day (BID) | ORAL | Status: DC
  Administered 2012-01-03 – 2012-01-06 (×6): 30 mg via ORAL
  Filled 2012-01-03: qty 1
  Filled 2012-01-03: qty 28
  Filled 2012-01-03: qty 1
  Filled 2012-01-03: qty 28
  Filled 2012-01-03 (×4): qty 1
  Filled 2012-01-03: qty 28
  Filled 2012-01-03: qty 1
  Filled 2012-01-03: qty 28

## 2012-01-03 NOTE — Progress Notes (Signed)
[facility] MD Progress Note  01/03/2012 4:44 PM  Diagnosis:   Axis I: Alcohol Abuse, Bereavement and Major Depression, Recurrent severe Axis II: Deferred Axis III:  Past Medical History  Diagnosis Date  . Mental disorder   . Hypertension   . Depression   . Bipolar 1 disorder   . Irregular heart rate    Axis IV: other psychosocial or environmental problems Axis V: 41-50 serious symptoms  ADL's:  Intact  Sleep: Fair, rough a lot of tossing and turning. Describes what sounds like DT hallucinations, will give Librium even thought he already on Neurontin.  Appetite:  Good  Suicidal Ideation:  Pt reports some suicidal thoughts still and feels worthless. Contracts for safety. Homicidal Ideation:  Pt denies any thoughts, plans, intent of homicide  Mental Status Examination/Evaluation: Objective:  Appearance: Casual  Eye Contact::  Good, has Left eye which gazes to the left more. He sees double.  Speech:  Clear and Coherent  Volume:  Normal  Mood:  Anxious, Depressed, Hopeless, Irritable and Worthless  Affect:  Congruent  Thought Process:  Coherent, Intact and Logical  Orientation:  Full  Thought Content:  WDL  Suicidal Thoughts:  Yes.  without intent/plan  Homicidal Thoughts:  No  Memory:  Immediate;   Fair Recent;   Fair Remote;   Fair  Judgement:  Impaired  Insight:  Fair  Psychomotor Activity:  Normal  Concentration:  Fair  Recall:  Fair  Akathisia:  No  Handed:  Left  AIMS (if indicated):     Assets:  Communication Skills Desire for Improvement  Sleep:  Number of Hours: 6.75    ROS: Neuro: some headaches last night, but none today no any ataxia, weakness.  GI: denies V/D/cramps/constipation, some stomach upset ness, possibly from stress.  MS: denies weakness, muscle cramps, aches.  Vital Signs:Blood pressure 128/73, pulse 103, temperature 98 F (36.7 C), temperature source Oral, resp. rate 17, height 6\' 1"  (1.854 m), weight 120.657 kg (266 lb). Current  Medications: Current Facility-Administered Medications  Medication Dose Route Frequency Provider Last Rate Last Dose  . acetaminophen (TYLENOL) tablet 650 mg  650 mg Oral Q6H PRN Larena Sox, MD   650 mg at 01/03/12 0352  . alum & mag hydroxide-simeth (MAALOX/MYLANTA) 200-200-20 MG/5ML suspension 30 mL  30 mL Oral Q4H PRN Larena Sox, MD      . chlordiazePOXIDE (LIBRIUM) capsule 25 mg  25 mg Oral Q6H PRN Mike Craze, MD   25 mg at 01/01/12 2133  . chlordiazePOXIDE (LIBRIUM) capsule 25 mg  25 mg Oral BID Mike Craze, MD       Followed by  . chlordiazePOXIDE (LIBRIUM) capsule 25 mg  25 mg Oral QAC breakfast Mike Craze, MD      . chlordiazePOXIDE (LIBRIUM) capsule 25 mg  25 mg Oral NOW Mike Craze, MD      . DULoxetine (CYMBALTA) DR capsule 30 mg  30 mg Oral BID Mike Craze, MD      . gabapentin (NEURONTIN) capsule 200 mg  200 mg Oral TID AC & HS Mike Craze, MD   200 mg at 01/03/12 7829   Followed by  . gabapentin (NEURONTIN) capsule 300 mg  300 mg Oral TID AC & HS Mike Craze, MD   300 mg at 01/03/12 1150  . hydrOXYzine (ATARAX/VISTARIL) tablet 25-50 mg  25-50 mg Oral Q6H PRN Verne Spurr, PA-C   50 mg at 12/30/11 1834  . loperamide (IMODIUM) capsule 2-4  mg  2-4 mg Oral PRN Mike Craze, MD      . magnesium hydroxide (MILK OF MAGNESIA) suspension 30 mL  30 mL Oral Daily PRN Larena Sox, MD      . multivitamin with minerals tablet 1 tablet  1 tablet Oral Daily Mike Craze, MD   1 tablet at 01/03/12 0805  . ondansetron (ZOFRAN-ODT) disintegrating tablet 4 mg  4 mg Oral Q6H PRN Mike Craze, MD      . QUEtiapine (SEROQUEL) tablet 50 mg  50 mg Oral [id number] Mickie D. Adams, PA   50 mg at 01/03/12 1457  . thiamine (VITAMIN B-1) tablet 100 mg  100 mg Oral Daily Mike Craze, MD   100 mg at 01/03/12 0805  . DISCONTD: DULoxetine (CYMBALTA) DR capsule 30 mg  30 mg Oral Daily Mickie D. Adams, PA   30 mg at 01/03/12 1610    Lab Results: No results found  for this or any previous visit (from the past 48 hour(s)).  Physical Findings: AIMS: Facial and Oral Movements Muscles of Facial Expression: None, normal Lips and Perioral Area: None, normal Jaw: None, normal Tongue: None, normal,Extremity Movements Upper (arms, wrists, hands, fingers): None, normal Lower (legs, knees, ankles, toes): None, normal, Trunk Movements Neck, shoulders, hips: None, normal, Overall Severity Severity of abnormal movements (highest score from questions above): None, normal Incapacitation due to abnormal movements: None, normal Patient's awareness of abnormal movements (rate only patient's report): No Awareness, Dental Status Current problems with teeth and/or dentures?: No Does patient usually wear dentures?: No  CIWA:  CIWA-Ar Total: 0  COWS:     Treatment Plan Summary: Daily contact with patient to assess and evaluate symptoms and progress in treatment Medication management Mood/anxiety less than 3/10 where the scale is 1 is the best and 10 is the worst No suicidal or homicidal thoughts for at least 48 hours.  Plan: Pt noted some thing crawling on him last night.  He scratches himself too harm and has some subcuticular bruising.  This sounds like DT type hallucinations.  Will offer Librium for that.  He is on an excalating dose of Neurontin, but it is not doing enough.  He has a headache last PM as well.  Tylenol took care of it.  He has gotten part way though the assignment of drawing a card for his deceased mother.  He has been on Cymbalta BID in the past.  He thought it was 60 mg.  Will start with the 30 mg he is on for now.  [name] [name] 01/03/2012, 4:44 PM

## 2012-01-03 NOTE — Progress Notes (Signed)
Psychoeducational Group Note  Date:  01/03/2012 Time:  2000  Group Topic/Focus:  Wrap-Up Group:   The focus of this group is to help patients review their daily goal of treatment and discuss progress on daily workbooks.  Participation Level:  Active  Participation Quality:  Drowsy  Affect:  Anxious  Cognitive:  Oriented  Insight:  Limited  Engagement in Group:  Good  Additional Comments:  Patient attended and participated in wrap-up group this evening. Patient stated that his rash was hurting and that he did not set a goal for himself today but he would try "to have a good day tomorrow."  Sheryle Vice, Newton Pigg 01/03/2012, 11:22 PM

## 2012-01-03 NOTE — Progress Notes (Signed)
Psychoeducational Group Note  Date:  01/03/2012 Time:  1100  Group Topic/Focus:  Personal Choices and Values:   The focus of this group is to help patients assess and explore the importance of values in their lives, how their values affect their decisions, how they express their values and what opposes their expression.  Participation Level:  Minimal  Participation Quality:  Appropriate and Sharing  Affect:  Appropriate  Cognitive:  Appropriate  Insight:  Good  Engagement in Group:  Good  Additional Comments:  Pt remained quiet and sit in the corner of dayroom majority of this group. Pt was willing to share his top two values which were looking good, and making money. Pt stated that since he was single he felt that it was important for him to look good and also wanted to make money to take care of his children. Pt appeared very pleasant and calm majority of this group.  Sharyn Lull 01/03/2012, 2:33 PM

## 2012-01-03 NOTE — Progress Notes (Signed)
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 [name] [name] was quiet but attentive while group discussed what labels are, how we use them, and listed positive and negative labels they have used or been called. [name] [name] was given a homework assignment to list 10 words he has been labeled to find the reality of the situation/label.    Wandra Scot 01/03/2012, 1:38 PM

## 2012-01-03 NOTE — Progress Notes (Signed)
D: Pt in bed resting with eyes closed. Respirations even and unlabored. Pt appears to be in no signs of distress at this time. A: Q15min checks remains for this pt. R: Pt remains safe at this time.   

## 2012-01-03 NOTE — Progress Notes (Signed)
D:  Pt. About on the unit today.  He presents with a flat affect/depressed mood.  He says that he didn't sleep well last pm "kept waking up"..  He is almost tearful when talking with RN.  Said he hears "mumbling in his head"... Can't make out voices.  He rates his depression an 8/10 and hopelessness a 8/10.  He has passive SI but no plan and does verbally contract for safety.  A:  Offered support and encouragement.  Given medications as prescribed.  R:  Receptive to staff, remains on q 15 minute checks for safety.  Will continue to monitor.

## 2012-01-03 NOTE — Progress Notes (Signed)
01/03/2012         Time: 1500      Group Topic/Focus: The focus of this group is on promoting emotional and psychological well-being through the process of creative expression, relaxation, socialization, fun and enjoyment.  Participation Level: Active  Participation Quality: Attentive  Affect: Appropriate  Cognitive: Oriented   Additional Comments: Patient much brighter today, overheard making inappropriate jokes with peers at times. Patient reports he is better now that he is back on his medication.   Akia Montalban 01/03/2012 4:03 PM

## 2012-01-03 NOTE — Progress Notes (Signed)
Pt attended discharge planning group and actively participated in group.  SW provided pt with today's workbook.  Pt presents with flat affect and depressed mood.  Pt rates depression and anxiety at an 8 today.  Pt endorses SI but contracts for safety on the unit.  Pt states that he is very depressed and doesn't care about anything anymore.  Pt states that his head hurts today and has a rash on his arm.  Pt states that he also had nightmares last night.  Pt states that he is trying to think positively but states he can't.  Pt states that he lives in [place].  Pt does not have a psychiatrist or therapist.  SW will refer pt to [facility] for medication management and therapy.  No further needs voiced by pt at this time.  Safety planning and suicide prevention discussed.  Pt participated in discussion and acknowledged an understanding of the information provided.       Reyes Ivan, LCSWA 01/03/2012  10:34 AM

## 2012-01-04 MED ORDER — QUETIAPINE FUMARATE 50 MG PO TABS
50.0000 mg | ORAL_TABLET | Freq: Every day | ORAL | Status: DC
  Administered 2012-01-05: 50 mg via ORAL
  Filled 2012-01-04 (×4): qty 1

## 2012-01-04 MED ORDER — METHOCARBAMOL 500 MG PO TABS
500.0000 mg | ORAL_TABLET | Freq: Three times a day (TID) | ORAL | Status: DC
  Administered 2012-01-05 – 2012-01-06 (×5): 500 mg via ORAL
  Filled 2012-01-04 (×2): qty 1
  Filled 2012-01-04 (×5): qty 42
  Filled 2012-01-04: qty 1
  Filled 2012-01-04: qty 42
  Filled 2012-01-04: qty 1

## 2012-01-04 NOTE — Progress Notes (Signed)
[facility] MD Progress Note  01/04/2012 11:27 PM  Diagnosis:   Axis I: Alcohol Abuse, Bereavement and Major Depression, Recurrent severe Axis II: Deferred Axis III:  Past Medical History  Diagnosis Date  . Mental disorder   . Hypertension   . Depression   . Bipolar 1 disorder   . Irregular heart rate    Axis IV: other psychosocial or environmental problems Axis V: 41-50 serious symptoms  ADL's:  Intact  Sleep: Fair,   Appetite:  Good  Suicidal Ideation:  Pt denies any thoughts, plans, intent of suicide Homicidal Ideation:  Pt denies any thoughts, plans, intent of homicide  Mental Status Examination/Evaluation: Objective:  Appearance: Casual  Eye Contact::  Good, has Left eye which gazes to the left more. He sees double.  Speech:  Clear and Coherent  Volume:  Normal  Mood:  Anxious, Depressed, Irritable and Worthless  Affect:  Congruent  Thought Process:  Coherent, Intact and Logical  Orientation:  Full  Thought Content:  WDL  Suicidal Thoughts:  Yes.  without intent/plan  Homicidal Thoughts:  No  Memory:  Immediate;   Fair Recent;   Fair Remote;   Fair  Judgement:  Impaired  Insight:  Fair  Psychomotor Activity:  Normal  Concentration:  Fair  Recall:  Fair  Akathisia:  No  Handed:  Left  AIMS (if indicated):     Assets:  Communication Skills Desire for Improvement  Sleep:  Number of Hours: 6    ROS: Neuro: felt shaky this afternoon, laid down and that got better otherwise no headaches, ataxia, weakness.  GI: no N/V/D/cramps/constipation  MS: no weakness, muscle cramps, but some aches. Will order Robaxin for that.  Vital Signs:Blood pressure 128/75, pulse 71, temperature 98.3 F (36.8 C), temperature source Oral, resp. rate 20, height 6\' 1"  (1.854 m), weight 120.657 kg (266 lb). Current Medications: Current Facility-Administered Medications  Medication Dose Route Frequency Provider Last Rate Last Dose  . acetaminophen (TYLENOL) tablet 650 mg  650 mg Oral Q6H  PRN Larena Sox, MD   650 mg at 01/04/12 0804  . alum & mag hydroxide-simeth (MAALOX/MYLANTA) 200-200-20 MG/5ML suspension 30 mL  30 mL Oral Q4H PRN Larena Sox, MD      . chlordiazePOXIDE (LIBRIUM) capsule 25 mg  25 mg Oral Q6H PRN Mike Craze, MD   25 mg at 01/01/12 2133  . chlordiazePOXIDE (LIBRIUM) capsule 25 mg  25 mg Oral BID Mike Craze, MD   25 mg at 01/04/12 1956   Followed by  . chlordiazePOXIDE (LIBRIUM) capsule 25 mg  25 mg Oral QAC breakfast Mike Craze, MD      . DULoxetine (CYMBALTA) DR capsule 30 mg  30 mg Oral BID Mike Craze, MD   30 mg at 01/04/12 1710  . gabapentin (NEURONTIN) capsule 300 mg  300 mg Oral TID AC & HS Mike Craze, MD   300 mg at 01/04/12 2243  . hydrOXYzine (ATARAX/VISTARIL) tablet 25-50 mg  25-50 mg Oral Q6H PRN Verne Spurr, PA-C   50 mg at 12/30/11 1834  . loperamide (IMODIUM) capsule 2-4 mg  2-4 mg Oral PRN Mike Craze, MD      . magnesium hydroxide (MILK OF MAGNESIA) suspension 30 mL  30 mL Oral Daily PRN Larena Sox, MD      . methocarbamol (ROBAXIN) tablet 500 mg  500 mg Oral TID Mike Craze, MD      . multivitamin with minerals tablet 1 tablet  1 tablet Oral Daily Mike Craze, MD   1 tablet at 01/04/12 [phone]  . ondansetron (ZOFRAN-ODT) disintegrating tablet 4 mg  4 mg Oral Q6H PRN Mike Craze, MD      . QUEtiapine (SEROQUEL) tablet 50 mg  50 mg Oral [id number] Mickie D. Adams, PA   50 mg at 01/04/12 2243  . QUEtiapine (SEROQUEL) tablet 50 mg  50 mg Oral QHS Mike Craze, MD      . thiamine (VITAMIN B-1) tablet 100 mg  100 mg Oral Daily Mike Craze, MD   100 mg at 01/04/12 0803    Lab Results: No results found for this or any previous visit (from the past 48 hour(s)).  Physical Findings: AIMS: Facial and Oral Movements Muscles of Facial Expression: None, normal Lips and Perioral Area: None, normal Jaw: None, normal Tongue: None, normal,Extremity Movements Upper (arms, wrists, hands, fingers): None,  normal Lower (legs, knees, ankles, toes): None, normal, Trunk Movements Neck, shoulders, hips: None, normal, Overall Severity Severity of abnormal movements (highest score from questions above): None, normal Incapacitation due to abnormal movements: None, normal Patient's awareness of abnormal movements (rate only patient's report): No Awareness, Dental Status Current problems with teeth and/or dentures?: No Does patient usually wear dentures?: No  CIWA:  CIWA-Ar Total: 0  COWS:     Treatment Plan Summary: Daily contact with patient to assess and evaluate symptoms and progress in treatment Medication management Mood/anxiety less than 3/10 where the scale is 1 is the best and 10 is the worst No suicidal or homicidal thoughts for at least 48 hours.  Plan: Pt noted some shakiness this afternoon and felt better after laying down.  He notes some back spasms.  Will order Robaxin for that.  Mood still not good.  He is detoxing still.  Will monitor.  Jeffie Widdowson 01/04/2012, 11:27 PM
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
## 2012-01-04 NOTE — Progress Notes (Signed)
Pt attended discharge planning group and actively participated in group.  SW provided pt with today's workbook.  Pt presents with drowsy affect and mood.  Pt had to be woken up in group.  Pt states that he is sleepy an didn't sleep well last night.  Pt states that his meds were changed and feels his mood is "so-so".  Pt rates depression and anxiety at a 7 today.  Pt denies SI.  Pt will follow up at [facility] for medication management and therapy; SW will secure pt's follow up.  No further needs voiced by pt at this time.    [name] [name], LCSWA 01/04/2012  10:57 AM

## 2012-01-04 NOTE — Progress Notes (Signed)
[facility] Group Notes:  (Counselor/Nursing/MHT/Case Management/Adjunct) 01/04/2012  1:15pm-2:30pm [facility] [place]   Type of Therapy:  Group Therapy  Participation Level:  Did Not Attend     [name], LCSW 01/04/2012  4:00 PM

## 2012-01-04 NOTE — Progress Notes (Signed)
Psychoeducational Group Note  Date:  01/04/2012 Time:  1100   Group Topic/Focus:  Self Esteem Action Plan:   The focus of this group is to help patients create a plan to continue to build self-esteem after discharge.  Participation Level:  None  Participation Quality:  Drowsy  Affect:  Flat  Cognitive:  Oriented  Insight:  None  Engagement in Group:  None  Additional Comments:  Pt. Slept through group  Meredith Staggers 01/04/2012, 8:09 PM

## 2012-01-04 NOTE — Progress Notes (Signed)
Patient ID: [name] [name], male   DOB: [birth year], 31 y.o.   MRN: [id number] D: Pt. Lying down, but gets up for group. Pt. Sad, reports "I been trying to make a few phone calls today, but none of the way I wanted, but tomorrow is another day."  A: Staff will monitor q54min for safety. Pt. Encouraged to go to [employer]. Pt. Encouraged to hold on to the thought of a better day. R: Pt. Remains safe on the unit. Pt. Does go to [employer].

## 2012-01-04 NOTE — Progress Notes (Signed)
[facility] Group Notes: (Counselor/Nursing/MHT/Case Management/Adjunct) 01/03/2012   1:15-2:30pm Emotion Regulation  Type of Therapy:  Group Therapy  Participation Level:  None  Participation Quality:   Drowsy    Affect:  Flat  Cognitive:  Appropriate  Insight:  None  Engagement in Group: None  Engagement in Therapy:  None  Modes of Intervention:  Support and Exploration  Summary of Progress/Problems: [name] [name] slept through group  [name], LCSW 01/03/11  3:56pm

## 2012-01-04 NOTE — Progress Notes (Signed)
Psychoeducational Group Note  Date:  01/04/2012 Time:  1000  Group Topic/Focus:  Therapeutic Activity- "Apples to Apples"  Participation Level: Did Not Attend  Participation Quality:  Not Applicable  Affect:  Not Applicable  Cognitive:  Not Applicable  Insight:  Not Applicable  Engagement in Group: Not Applicable  Additional Comments:  Pt did not attend group because of a consult that was taking place.   Sharyn Lull 01/04/2012, 10:54 AM

## 2012-01-04 NOTE — Progress Notes (Signed)
  D) Patient pleasant and cooperative upon my assessment. Patient states slept "poor," and  appetite is " improving." Patient rates depression as  8 /10, patient rates hopeless feelings as  8/10. Patient endorses passive SI, contracts verbally for safety with RN. Patient denies HI, denies A/V hallucinations.   A) Patient offered support and encouragement, patient encouraged to discuss feelings/concerns with staff. Patient verbalized understanding. Patient monitored Q15 minutes for safety. Patient met with MD to discuss today's goals and plan of care.  R) Patient active on unit, attending groups in day room and meals in dining room.  Patient taking medications as ordered. Will continue to monitor.

## 2012-01-05 MED ORDER — GABAPENTIN 300 MG PO CAPS: 300.0000 mg | ORAL_CAPSULE | Freq: Three times a day (TID) | ORAL | Status: DC

## 2012-01-05 MED ORDER — THIAMINE HCL 100 MG PO TABS: 100.0000 mg | ORAL_TABLET | Freq: Every day | ORAL | Status: DC

## 2012-01-05 MED ORDER — METHOCARBAMOL 500 MG PO TABS: 500.0000 mg | ORAL_TABLET | Freq: Three times a day (TID) | ORAL | Status: AC

## 2012-01-05 MED ORDER — QUETIAPINE FUMARATE 50 MG PO TABS: ORAL_TABLET | ORAL | Status: DC

## 2012-01-05 MED ORDER — ADULT MULTIVITAMIN W/MINERALS CH: 1.0000 | ORAL_TABLET | Freq: Every day | ORAL | Status: DC

## 2012-01-05 MED ORDER — DULOXETINE HCL 30 MG PO CPEP: 30.0000 mg | ORAL_CAPSULE | Freq: Two times a day (BID) | ORAL | Status: DC

## 2012-01-05 NOTE — Progress Notes (Signed)
01/05/2012         Time: 1500      Group Topic/Focus: The focus of the group is on enhancing the patients' ability to utilize positive relaxation strategies by practicing several that can be used at discharge. Group discusses how relaxation exercises can be used as part of pain management and to encourage sleep as well.  Participation Level: Active  Participation Quality: Attentive  Affect: Appropriate  Cognitive: Oriented   Additional Comments: Patient bright, looking forward to going to long term treatment tomorrow.    Tanaja Ganger 01/05/2012 3:38 PM

## 2012-01-05 NOTE — Discharge Summary (Signed)
Physician Discharge Summary Note  Patient:  [name] [name] is an 31 y.o., male MRN:  [id number] DOB:  1981-01-25 Patient phone:  [phone] (home)  Patient address:   [address] [place] [address]   Date of Admission:  12/30/2011 Date of Discharge: 01/06/2012  Discharge Diagnoses: Principal Problem:  *Depression, psychotic Active Problems:  Depression  Hypertension  Alcohol abuse  Bereavement  Axis Diagnosis:  Axis I: Alcohol Abuse, Bereavement and Major Depression, Recurrent severe Axis II: Deferred Axis III:  Past Medical History  Diagnosis Date  . Mental disorder   . Hypertension   . Depression   . Bipolar 1 disorder   . Irregular heart rate    Axis IV: other psychosocial or environmental problems Axis V: 61-70 mild symptoms  Level of Care:  [facility]  Hospital Course:   Feels everyone kicks him when he is down and teats him as if he were worthless. Has reported AH when here before but denies at this time. Has been non-compliant with meds since his mom passed November 2012.   While a patient in this hospital, [name] [name] received medication management for depression, anxiety, back pain and back spasms. They were ordered and received Cymbalta for depression and pain management, Neurontin for anxiety, Seroquel for insomnia and anxiety, and Robaxin for back spasms. They were also enrolled in group counseling sessions and activities in which they participated actively.   Patient attended treatment team meeting this am and met with treatment team members. Pt symptoms, treatment plan and response to treatment discussed. [name] [name] endorsed that their symptoms have improved. Pt also stated that they are stable for discharge.  They reported that from this hospital stay they had learned that they need to give up their apartment and job to get away from people that use and to stay clean.  In other to maintain their sobriety, mood, anxiety,and back pain management, they  will first go to [facility] and then later continue psychiatric care on outpatient basis at [facility] on 8/13 with Dr Ladona Ridgel at 1245.   Upon discharge, patient adamantly denies suicidal, homicidal ideations, auditory, visual hallucinations and or delusional thinking. They left [facility] with all personal belongings via transportation provided by [facility] in no apparent distress.  Consults:  None  Significant Diagnostic Studies:  labs: CMET, CBC, BAL, and UDS non contributory  Discharge Vitals:   Blood pressure 116/81, pulse 82, temperature 98 F (36.7 C), temperature source Oral, resp. rate 16, height 6\' 1"  (1.854 m), weight 120.657 kg (266 lb)..  Mental Status Exam: See Mental Status Examination and Suicide Risk Assessment completed by Attending Physician prior to discharge.  Discharge destination:  [facility]  Is patient on multiple antipsychotic therapies at discharge:  No  Has Patient had three or more failed trials of antipsychotic monotherapy by history: N/A Recommended Plan for Multiple Antipsychotic Therapies: N/A  Medication List  As of 01/05/2012 12:21 PM   TAKE these medications      Indication    DULoxetine 30 MG capsule   Commonly known as: CYMBALTA   Take 1 capsule (30 mg total) by mouth 2 (two) times daily. For depression and pain management.       gabapentin 300 MG capsule   Commonly known as: NEURONTIN   Take 1 capsule (300 mg total) by mouth 4 (four) times daily -  before meals and at bedtime. For anxiety       methocarbamol 500 MG tablet   Commonly known as: ROBAXIN  Take 1 tablet (500 mg total) by mouth 3 (three) times daily. For back spasms.       multivitamin with minerals Tabs   Take 1 tablet by mouth daily. For nutritional supplementation.       QUEtiapine 50 MG tablet   Commonly known as: SEROQUEL   Take by mouth 1 twice a day for anxiety and 2 at bedtime for insomnia and anxiety       thiamine 100 MG tablet   Take 1 tablet (100 mg total) by mouth daily. For nutritional  supplementation.            Follow-up Information    Follow up with [facility] on 01/16/2012. (Appointment scheduled at 12:45 pm with Dr. Ladona Ridgel)    Contact information:   [address], [place] [address] [phone]        Follow-up recommendations:   Activities: Resume typical activities Diet: Resume typical diet Other: Follow up with outpatient provider and report any side effects to out patient prescriber.  Comments:  Take all your medications as prescribed by your mental healthcare provider. Report any adverse effects and or reactions from your medicines to your outpatient provider promptly. Patient is instructed and cautioned to not engage in alcohol and or illegal drug use while on prescription medicines. In the event of worsening symptoms, patient is instructed to call the crisis hotline, 911 and or go to the nearest ED for appropriate evaluation and treatment of symptoms.  SignedDan Humphreys, Taji Barretto 01/05/2012 12:21 PM

## 2012-01-05 NOTE — Tx Team (Signed)
Interdisciplinary Treatment Plan Update (Adult)  Date:  01/05/2012  Time Reviewed:  10:51 AM   Progress in Treatment: Attending groups: Yes Participating in groups:  Yes Taking medication as prescribed: Yes Tolerating medication:  Yes Family/Significant other contact made: Attempts made Patient understands diagnosis:  Yes Discussing patient identified problems/goals with staff:  Yes Medical problems stabilized or resolved:  Yes Denies suicidal/homicidal ideation: Yes Issues/concerns per patient self-inventory:  None identified Other: N/A  New problem(s) identified: None Identified  Reason for Continuation of Hospitalization: Stable to d/c  Interventions implemented related to continuation of hospitalization: Stable to d/c  Additional comments: N/A  Estimated length of stay: D/C today  Discharge Plan: Pt will go to [facility] for further treatment.   New goal(s): N/A  Review of initial/current patient goals per problem list:    1.  Goal(s): Reduce depressive symptoms  Met:  Yes  Target date: by discharge  As evidenced by: Reducing depression from a 10 to a 3 as reported by pt.  Pt rates at a 5 today but reports feeling stable to d/c today.   2.  Goal (s): Reduce/Eliminate suicidal ideation  Met:  Yes  Target date: by discharge  As evidenced by: pt reporting no SI.    3.  Goal(s): Reduce anxiety symptoms  Met:  Yes  Target date: by discharge  As evidenced by: Reduce anxiety from a 10 to a 3 as reported by pt. Pt rates at a 5 today but reports feeling stable to d/c today.     Attendees: Patient:  [name] [name] 01/05/2012 10:51 AM   Family:     Physician:  Orson Aloe, MD 01/05/2012 10:51 AM   Nursing:   Waynetta Sandy, RN 01/05/2012 10:51 AM   Case Manager:  Reyes Ivan, LCSWA 01/05/2012 10:51 AM   Counselor:  [name], LCSW 01/05/2012 10:51 AM   Other:  Berneice Heinrich, RN 01/05/2012 10:51 AM   Other:    Other:     Other:      Scribe for Treatment Team:     Carmina Miller, 01/05/2012 10:51 AM

## 2012-01-05 NOTE — Progress Notes (Addendum)
[facility] MD Progress Note  01/05/2012 12:01 PM  Diagnosis:   Axis I: Alcohol Abuse, Bereavement and Major Depression, Recurrent severe Axis II: Deferred Axis III:  Past Medical History  Diagnosis Date  . Mental disorder   . Hypertension   . Depression   . Bipolar 1 disorder   . Irregular heart rate    Axis IV: other psychosocial or environmental problems Axis V: 61-70 mild symptoms  ADL's:  Intact  Sleep: Good,   Appetite:  Good  Suicidal Ideation:  Pt denies any thoughts, plans, intent of suicide.  He had some last night and marked his pt survey this AM with that, but now that he has decided that he must let go of his job and apartment, he has a sense of peace and has a full intention of changing everything about his life and stopping alcohol. Homicidal Ideation:  Pt denies any thoughts, plans, intent of homicide  Mental Status Examination/Evaluation: Objective:  Appearance: Casual  Eye Contact::  Good, has Left eye which gazes to the left more. He sees double.  Speech:  Clear and Coherent  Volume:  Normal  Mood:  Euthymic  Affect:  Congruent  Thought Process:  Coherent, Intact and Logical  Orientation:  Full  Thought Content:  WDL  Suicidal Thoughts:  No  Homicidal Thoughts:  No  Memory:  Immediate;   Good Recent;   Good Remote;   Good  Judgement:  Fair  Insight:  Fair  Psychomotor Activity:  Normal  Concentration:  Good  Recall:  Good  Akathisia:  No  Handed:  Left  AIMS (if indicated):     Assets:  Communication Skills Desire for Improvement  Sleep:  Number of Hours: 6.75    ROS: Neuro: denies headaches, ataxia, weakness.  GI: denies N/V/D/cramps/constipation  MS: denies weakness, muscle cramps, aches  Vital Signs:Blood pressure 116/81, pulse 82, temperature 98 F (36.7 C), temperature source Oral, resp. rate 16, height 6\' 1"  (1.854 m), weight 120.657 kg (266 lb). Current Medications: Current Facility-Administered Medications  Medication Dose Route  Frequency Provider Last Rate Last Dose  . acetaminophen (TYLENOL) tablet 650 mg  650 mg Oral Q6H PRN Larena Sox, MD   650 mg at 01/04/12 0804  . alum & mag hydroxide-simeth (MAALOX/MYLANTA) 200-200-20 MG/5ML suspension 30 mL  30 mL Oral Q4H PRN Larena Sox, MD      . chlordiazePOXIDE (LIBRIUM) capsule 25 mg  25 mg Oral Q6H PRN Mike Craze, MD   25 mg at 01/01/12 2133  . chlordiazePOXIDE (LIBRIUM) capsule 25 mg  25 mg Oral BID Mike Craze, MD   25 mg at 01/04/12 1956   Followed by  . chlordiazePOXIDE (LIBRIUM) capsule 25 mg  25 mg Oral QAC breakfast Mike Craze, MD   25 mg at 01/05/12 0732  . DULoxetine (CYMBALTA) DR capsule 30 mg  30 mg Oral BID Mike Craze, MD   30 mg at 01/05/12 0733  . gabapentin (NEURONTIN) capsule 300 mg  300 mg Oral TID AC & HS Mike Craze, MD   300 mg at 01/05/12 1156  . hydrOXYzine (ATARAX/VISTARIL) tablet 25-50 mg  25-50 mg Oral Q6H PRN Verne Spurr, PA-C   50 mg at 12/30/11 1834  . loperamide (IMODIUM) capsule 2-4 mg  2-4 mg Oral PRN Mike Craze, MD      . magnesium hydroxide (MILK OF MAGNESIA) suspension 30 mL  30 mL Oral Daily PRN Larena Sox, MD      .  methocarbamol (ROBAXIN) tablet 500 mg  500 mg Oral TID Mike Craze, MD   500 mg at 01/05/12 0733  . multivitamin with minerals tablet 1 tablet  1 tablet Oral Daily Mike Craze, MD   1 tablet at 01/05/12 [phone]  . ondansetron (ZOFRAN-ODT) disintegrating tablet 4 mg  4 mg Oral Q6H PRN Mike Craze, MD      . QUEtiapine (SEROQUEL) tablet 50 mg  50 mg Oral [id number] Mickie D. Adams, PA   50 mg at 01/05/12 0733  . QUEtiapine (SEROQUEL) tablet 50 mg  50 mg Oral QHS Mike Craze, MD      . thiamine (VITAMIN B-1) tablet 100 mg  100 mg Oral Daily Mike Craze, MD   100 mg at 01/05/12 9604    Lab Results: No results found for this or any previous visit (from the past 48 hour(s)).  Physical Findings: AIMS: Facial and Oral Movements Muscles of Facial Expression: None, normal Lips  and Perioral Area: None, normal Jaw: None, normal Tongue: None, normal,Extremity Movements Upper (arms, wrists, hands, fingers): None, normal Lower (legs, knees, ankles, toes): None, normal, Trunk Movements Neck, shoulders, hips: None, normal, Overall Severity Severity of abnormal movements (highest score from questions above): None, normal Incapacitation due to abnormal movements: None, normal Patient's awareness of abnormal movements (rate only patient's report): No Awareness, Dental Status Current problems with teeth and/or dentures?: No Does patient usually wear dentures?: No  CIWA:  CIWA-Ar Total: 0  COWS:     Treatment Plan Summary: Daily contact with patient to assess and evaluate symptoms and progress in treatment Medication management Mood/anxiety less than 3/10 where the scale is 1 is the best and 10 is the worst No suicidal or homicidal thoughts for at least 48 hours.  Plan: BP is stable now.  It was elevated when he was in withdrawal, but his BP has returned to typical for him.  Pt has met inpatient goals and is ready for D/C.  Will D/C today.  Ellsie Violette 01/05/2012, 12:01 PM

## 2012-01-05 NOTE — Progress Notes (Signed)
[facility] Group Notes:  (Counselor/Nursing/MHT/Case Management/Adjunct)  01/05/2012 10:00 PM  Type of Therapy:  Psychoeducational Skills  Participation Level:  Active  Participation Quality:  Appropriate  Affect:  Appropriate  Cognitive:  Appropriate  Insight:  Good  Engagement in Group:  Good  Engagement in Therapy:  Good  Modes of Intervention:  Support  Summary of Progress/Problems:Pt. attended group and participated in group. Pt. Stated he had a good day and was able to make arrangement for housing after discharge  Gwenevere Ghazi Patience 01/05/2012, 10:00 PM

## 2012-01-05 NOTE — Progress Notes (Signed)
[facility] Group Notes: (Counselor/Nursing/MHT/Case Management/Adjunct) 01/05/2012   @1 :15 -2:30pm Preventing Relapse  Type of Therapy:  Group Therapy  Participation Level:  Limited  Participation Quality:  Attentive, Sharing   Affect:  Blunted  Cognitive:  Appropriate  Insight:  Limited  Engagement in Group: Limited  Engagement in Therapy:  Minimal  Modes of Intervention:  Support and Exploration  Summary of Progress/Problems: [name] [name] explored warning signs of relapse, and reported that for him drinking is a part of the relapse process. He shared about drinking and depression going hand in hand for him, stating that he drinks to deal with the depression. [name] [name] shared that he seems to care less about taking care of himself when he is depressed. He seemed unable to identify some of the emotions that go along with his warning signs, but was very attentive to discussion.  [name], LCSW 01/05/2012 2:57 PM

## 2012-01-05 NOTE — Progress Notes (Signed)
Patient ID: GARON MELANDER, male   DOB: 1981/03/03, 31 y.o.   MRN: [id number] D: Pt. In bed this evening, but gets up to speak with Clinical research associate. Pt. Presents with sad affect and reports "tried to make a few phone calls, but nothing didn't go the way I want it to, but tomorrow's another day". A: Pt. Encouraged to keep positive attitude, if one avenue doesn't work look for another one. Staff will monitor q32min for safety. Dispensing optician. R: Pt. Is safe on the unit. Pt. Goes to [employer].

## 2012-01-05 NOTE — Progress Notes (Signed)
Pt attended discharge planning group and actively participated in group.  SW provided pt with today's workbook.  Pt presents with calm mood and affect.  Pt rates depression and anxiety at a 5 today.  Pt denies SI.  Pt continues to discuss not knowing how to get over the depression but states he needs to just change his environment and move on.  Pt now discussed wanting treatment for his alcohol use.  Pt was accepted to [facility] with a bed available tomorrow.  Pt will need 2 week supply of meds in hand and will need to be in the lobby by 2:00 pm for transportation provided by [facility].  No further needs voiced by pt at this time.  Safety planning and suicide prevention discussed.  Pt participated in discussion and acknowledged an understanding of the information provided.       [name] [name], LCSWA 01/05/2012  2:18 PM

## 2012-01-05 NOTE — [facility] Suicide Risk Assessment (Signed)
Suicide Risk Assessment  Discharge Assessment     Demographic factors:  Male;Adolescent or young adult;Caucasian;Low socioeconomic status    Current Mental Status Per Nursing Assessment::   On Admission:  Self-harm thoughts At Discharge:     Loss Factors: Loss of significant relationship  Continued Clinical Symptoms:  Depression:   Comorbid alcohol abuse/dependence Insomnia Alcohol/Substance Abuse/Dependencies Chronic Pain Previous Psychiatric Diagnoses and Treatments  Cognitive Features That Contribute To Risk:  Thought constriction (tunnel vision)    Suicide Risk:  Minimal: No identifiable suicidal ideation.  Patients presenting with no risk factors but with morbid ruminations; may be classified as minimal risk based on the severity of the depressive symptoms  Diagnosis:   Axis I: Alcohol Abuse, Bereavement and Major Depression, Recurrent severe Axis II: Deferred Axis III:  Past Medical History  Diagnosis Date  . Mental disorder   . Hypertension   . Depression   . Bipolar 1 disorder   . Irregular heart rate    Axis IV: other psychosocial or environmental problems Axis V: 61-70 mild symptoms  ADL's:  Intact  Sleep: Good,   Appetite:  Good  Suicidal Ideation:  Pt denies any thoughts, plans, intent of suicide.  He had some last night and marked his pt survey this AM with that, but now that he has decided that he must let go of his job and apartment, he has a sense of peace and has a full intention of changing everything about his life and stopping alcohol. Homicidal Ideation:  Pt denies any thoughts, plans, intent of homicide  Mental Status Examination/Evaluation: Objective:  Appearance: Casual  Eye Contact::  Good, has Left eye which gazes to the left more. He sees double.  Speech:  Clear and Coherent  Volume:  Normal  Mood:  Euthymic  Affect:  Congruent  Thought Process:  Coherent, Intact and Logical  Orientation:  Full  Thought Content:  WDL  Suicidal  Thoughts:  No  Homicidal Thoughts:  No  Memory:  Immediate;   Good Recent;   Good Remote;   Good  Judgement:  Fair  Insight:  Fair  Psychomotor Activity:  Normal  Concentration:  Good  Recall:  Good  Akathisia:  No  Handed:  Left  AIMS (if indicated):     Assets:  Communication Skills Desire for Improvement  Sleep:  Number of Hours: 6.75    ROS: Neuro: denies headaches, ataxia, weakness.  GI: denies N/V/D/cramps/constipation  MS: denies weakness, muscle cramps, aches  Vital Signs:Blood pressure 116/81, pulse 82, temperature 98 F (36.7 C), temperature source Oral, resp. rate 16, height 6\' 1"  (1.854 m), weight 120.657 kg (266 lb). Current Medications: Current Facility-Administered Medications  Medication Dose Route Frequency Provider Last Rate Last Dose  . acetaminophen (TYLENOL) tablet 650 mg  650 mg Oral Q6H PRN Larena Sox, MD   650 mg at 01/04/12 0804  . alum & mag hydroxide-simeth (MAALOX/MYLANTA) 200-200-20 MG/5ML suspension 30 mL  30 mL Oral Q4H PRN Larena Sox, MD      . chlordiazePOXIDE (LIBRIUM) capsule 25 mg  25 mg Oral Q6H PRN Mike Craze, MD   25 mg at 01/01/12 2133  . chlordiazePOXIDE (LIBRIUM) capsule 25 mg  25 mg Oral BID Mike Craze, MD   25 mg at 01/04/12 1956   Followed by  . chlordiazePOXIDE (LIBRIUM) capsule 25 mg  25 mg Oral QAC breakfast Mike Craze, MD   25 mg at 01/05/12 0732  . DULoxetine (CYMBALTA) DR capsule 30 mg  30 mg Oral BID Mike Craze, MD   30 mg at 01/05/12 1610  . gabapentin (NEURONTIN) capsule 300 mg  300 mg Oral TID AC & HS Mike Craze, MD   300 mg at 01/05/12 1156  . hydrOXYzine (ATARAX/VISTARIL) tablet 25-50 mg  25-50 mg Oral Q6H PRN Verne Spurr, PA-C   50 mg at 12/30/11 1834  . loperamide (IMODIUM) capsule 2-4 mg  2-4 mg Oral PRN Mike Craze, MD      . magnesium hydroxide (MILK OF MAGNESIA) suspension 30 mL  30 mL Oral Daily PRN Larena Sox, MD      . methocarbamol (ROBAXIN) tablet 500 mg  500 mg Oral  TID Mike Craze, MD   500 mg at 01/05/12 0733  . multivitamin with minerals tablet 1 tablet  1 tablet Oral Daily Mike Craze, MD   1 tablet at 01/05/12 [phone]  . ondansetron (ZOFRAN-ODT) disintegrating tablet 4 mg  4 mg Oral Q6H PRN Mike Craze, MD      . QUEtiapine (SEROQUEL) tablet 50 mg  50 mg Oral [id number] Mickie D. Adams, PA   50 mg at 01/05/12 0733  . QUEtiapine (SEROQUEL) tablet 50 mg  50 mg Oral QHS Mike Craze, MD      . thiamine (VITAMIN B-1) tablet 100 mg  100 mg Oral Daily Mike Craze, MD   100 mg at 01/05/12 5409    Lab Results: No results found for this or any previous visit (from the past 72 hour(s)).  RISK REDUCTION FACTORS: What pt has learned from hospital stay is that he needs to let go of his job and apartment and move on with his life.  Risk of self harm is elevated by his depression, bereavement, and addictions, but he has his life and his son to live for.  Risk of harm to others is minimal in that he has not been involved in fights or had any legal charges filed on him.  Pt seen in treatment team where he divulged the above information. The treatment team concluded that he was ready for discharge and had met his goals for an inpatient setting.  PLAN: Discharge home Continue Medication List  As of 01/05/2012 12:16 PM   TAKE these medications      Indication    DULoxetine 30 MG capsule   Commonly known as: CYMBALTA   Take 1 capsule (30 mg total) by mouth 2 (two) times daily. For depression and pain management.       gabapentin 300 MG capsule   Commonly known as: NEURONTIN   Take 1 capsule (300 mg total) by mouth 4 (four) times daily -  before meals and at bedtime. For anxiety       methocarbamol 500 MG tablet   Commonly known as: ROBAXIN   Take 1 tablet (500 mg total) by mouth 3 (three) times daily. For back spasms.       multivitamin with minerals Tabs   Take 1 tablet by mouth daily. For nutritional supplementation.       QUEtiapine 50 MG  tablet   Commonly known as: SEROQUEL   Take by mouth 1 twice a day for anxiety and 2 at bedtime for insomnia and anxiety       thiamine 100 MG tablet   Take 1 tablet (100 mg total) by mouth daily. For nutritional supplementation.            Follow-up recommendations:  Activities: Resume typical activities  Diet: Resume typical diet Other: Follow up with outpatient provider and report any side effects to out patient prescriber.  Plan: Pt has met inpatient goals and is ready for D/C.  Will D/C today.  Oluwatomisin Hustead 01/05/2012, 12:11 PM

## 2012-01-05 NOTE — Progress Notes (Signed)
Psychoeducational Group Note  Date:  01/05/2012 Time:  1100  Group Topic/Focus:  Stages of Change:   The focus of this group is to explain the stages of change and help patients identify changes they want to make upon discharge.  Participation Level:  Did not attend  Participation Quality:  NA Patient did not attend group.  Affect:    Cognitive:    Insight:    Engagement in Group:    Additional Comments: Patient did not attend stages of change group today. Patient remained outside of the group room, in the patient's room and roaming the halls for the duration of group this group. Patient also entered and exited the group during the middle of group at two different times today.  Jerri Glauser, Newton Pigg 01/05/2012, 12:20 PM

## 2012-01-05 NOTE — Progress Notes (Signed)
[facility] Adult Inpatient Family/Significant Other Suicide Prevention Education  Suicide Prevention Education:  Contact Attempts: Penni Homans, friend, ([phone]) has been identified by the patient as the family member/significant other with whom the patient will be residing, and identified as the person(s) who will aid the patient in the event of a mental health crisis.  With written consent from the patient, two attempts were made to provide suicide prevention education, prior to and/or following the patient's discharge.  We were unsuccessful in providing suicide prevention education.  A suicide education pamphlet was given to the patient to share with family/significant other.  Date and time of first attempt:01/05/2012 11:29 AM  Date and time of second attempt:    [name] [name] intially refused to allow contact with anyone, but then decided that friend [name] could be contacted. On the same day that contact was attempted, [name] [name] asked about further treatment for alcohol dependence and was accepted by [facility]. Although suicide prevention education was not completed with [name], it was done with [name] [name] and he was admitted by a facility that has knowledge of suicide prevention education and of the fact that [name] [name] was recently endorsing SI.   [name] 01/05/2012, 11:28 AM

## 2012-01-05 NOTE — Progress Notes (Signed)
Pt reports that he plans to go to [facility] for rehab and leave his living situation behind him allowing him to distance himself from drugs and negative influences. He denies si and hi at present time.

## 2012-01-06 NOTE — Progress Notes (Signed)
[name] [name] is seen out in the milieu this morning..he is pleasant, cooperative and interacting appropriately . He completed his self inventory and on it he worte he denied SI, he rated his depression and hopelessness " 2 / 2 " and stated his DC plan is to " get out of here today".      A He attends his groups and is engaged in his POC.      R Safety is in place and POC includes continuing to foster therapeutic relationship. PD RN [facility]

## 2012-01-06 NOTE — Progress Notes (Signed)
[facility] Group Notes:  (Counselor/Nursing/MHT/Case Management/Adjunct)  01/06/2012 10:56 AM  Type of Therapy:  After Care Planning Group  Pt. participated in after care group and was given [facility] pamphlet and crisis hotline numbers. Pt. Agreed to use them if needed. The pt. Spoke about being d/c today and going to [facility]. The pt. Pt. denied SI/Hi today and states he feels he is ready for d/c. Pt. Stated he was supposed to leave yesterday, but that it got changed to today. Pt. Had no question or concerns.    Neila Gear 01/06/2012, 10:56 AM

## 2012-01-06 NOTE — Progress Notes (Signed)
[facility] Case Management Discharge Plan:  Will you be returning to the same living situation after discharge: No. At discharge, do you have transportation home?:Yes,  Pt. will be picked up at 2:00 p.m to be transported to [facility] Do you have the ability to pay for your medications:Yes,    Interagency Information:     Release of information consent forms completed and in the chart;  Patient's signature needed at discharge.  Patient to Follow up at:  Follow-up Information    Follow up with [facility] on 01/06/2012. ([facility] will pick you up at 2:00 pm on this date!)    Contact information:   [address] [place], [place] [address] [phone]         Patient denies SI/HI:   Yes,      Safety Planning and Suicide Prevention discussed:  Yes,    Barrier to discharge identified:No.  Summary and Recommendations: Pt. Will follow up with appointments.  Pt. Given [place]  Pamphlet and crisis hot line numbers and pt. agreed to use them if needed.   Neila Gear 01/06/2012, 9:35 AM

## 2012-01-06 NOTE — Progress Notes (Signed)
Patient ID: [name] [name], male   DOB: 1980/08/27, 31 y.o.   MRN: [id number]  Problem: Depression, ETOH Abuse  D: Pt pleasant and cooperative with staff. Pt interacting well with peers in the milieu.  A: Monitor patient Q 15 minutes for safety, encourage staff/peer interaction and group participation. Administer medications as ordered by MD.  R: Pt compliant with medications and participated in group session. Pt denies SI or plans to harm himself. Pt states he feels he is doing better today.

## 2012-01-06 NOTE — [facility] Suicide Risk Assessment (Signed)
Suicide Risk Assessment Discharge Assessment  Demographic factors:  Male;Adolescent or young adult;Caucasian;Low socioeconomic status   Current Mental Status Per Nursing Assessment::  On Admission: Self-harm thoughts  At Discharge:  Loss Factors:  Loss of significant relationship  Continued Clinical Symptoms:  Depression: Comorbid alcohol abuse/dependence  Insomnia  Alcohol/Substance Abuse/Dependencies  Chronic Pain  Previous Psychiatric Diagnoses and Treatments  Cognitive Features That Contribute To Risk:  Thought constriction (tunnel vision)  Suicide Risk:  Minimal: No identifiable suicidal ideation. Patients presenting with no risk factors but with morbid ruminations; may be classified as minimal risk based on the severity of the depressive symptoms  Diagnosis:  Axis I: Alcohol Abuse, Bereavement and Major Depression, Recurrent severe  Axis II: Deferred  Axis III:  Past Medical History   Diagnosis  Date   .  Mental disorder    .  Hypertension    .  Depression    .  Bipolar 1 disorder    .  Irregular heart rate    Axis IV: other psychosocial or environmental problems  Axis V: 61-70 mild symptoms  ADL's: Intact  Sleep: Good,  Appetite: Good  Suicidal Ideation:  Pt denies any thoughts, plans, intent of suicide. He had some last night and marked his pt survey this AM with that, but now that he has decided that he must let go of his job and apartment, he has a sense of peace and has a full intention of changing everything about his life and stopping alcohol.  Homicidal Ideation:  Pt denies any thoughts, plans, intent of homicide  Mental Status Examination/Evaluation:  Objective: Appearance: Casual   Eye Contact:: Good, has Left eye which gazes to the left more. He sees double.   Speech: Clear and Coherent   Volume: Normal   Mood: Euthymic   Affect: Congruent   Thought Process: Coherent, Intact and Logical   Orientation: Full   Thought Content: WDL   Suicidal Thoughts:  No   Homicidal Thoughts: No   Memory: Immediate; Good  Recent; Good  Remote; Good   Judgement: Fair   Insight: Fair   Psychomotor Activity: Normal   Concentration: Good   Recall: Good   Akathisia: No   Handed: Left   AIMS (if indicated):   Assets: Communication Skills  Desire for Improvement   Sleep: Number of Hours: 6.75   ROS:  Neuro: denies headaches, ataxia, weakness.  GI: denies N/V/D/cramps/constipation  MS: denies weakness, muscle cramps, aches  Vital Signs:Blood pressure 116/81, pulse 82, temperature 98 F (36.7 C), temperature source Oral, resp. rate 16, height 6\' 1"  (1.854 m), weight 120.657 kg (266 lb).   Lab Results:  No results found for this or any previous visit (from the past 72 hour(s)).  RISK REDUCTION FACTORS:  What pt has learned from hospital stay is that he needs to let go of his job and apartment and move on with his life.  Risk of self harm is elevated by his depression, bereavement, and addictions, but he has his life and his son to live for.  Risk of harm to others is minimal in that he has not been involved in fights or had any legal charges filed on him.  Pt seen in treatment team where he divulged the above information. The treatment team concluded that he was ready for discharge and had met his goals for an inpatient setting.  PLAN:  Discharge home  Continue  Medication List  As of 01/05/2012 12:16 PM    TAKE these medications  Indication      DULoxetine 30 MG capsule       Commonly known as: CYMBALTA       Take 1 capsule (30 mg total) by mouth 2 (two) times daily. For depression and pain management.       gabapentin 300 MG capsule       Commonly known as: NEURONTIN       Take 1 capsule (300 mg total) by mouth 4 (four) times daily - before meals and at bedtime. For anxiety       methocarbamol 500 MG tablet       Commonly known as: ROBAXIN       Take 1 tablet (500 mg total) by mouth 3 (three) times daily. For back spasms.       multivitamin  with minerals Tabs       Take 1 tablet by mouth daily. For nutritional supplementation.       QUEtiapine 50 MG tablet       Commonly known as: SEROQUEL       Take by mouth 1 twice a day for anxiety and 2 at bedtime for insomnia and anxiety       thiamine 100 MG tablet       Take 1 tablet (100 mg total) by mouth daily. For nutritional supplementation.          Follow-up recommendations:  Activities: Resume typical activities  Diet: Resume typical diet  Other: Follow up with outpatient provider and report any side effects to out patient prescriber.  Plan:  Pt has met inpatient goals and is ready for D/C. Will D/C today.

## 2012-01-06 NOTE — Progress Notes (Signed)
Patient ID: [name] [name], male   DOB: Jan 23, 1981, 31 y.o.   MRN: [id number] 01-06-12 nursing d/c note; pt stated he was ready for d/c to [facility]. [facility] will send a representative to p/u patient. Pt stated he understood his d/c instructions. He got samples, scripts and f/u information. He denied any si/hi/av.his belongings were rtned.

## 2012-01-08 NOTE — Progress Notes (Signed)
Patient Discharge Instructions:  After Visit Summary (AVS):   Faxed to:  01/08/2012 Psychiatric Admission Assessment Note:   Faxed to:  01/08/2012 Suicide Risk Assessment - Discharge Assessment:   Faxed to:  01/08/2012 Faxed/Sent to the Next Level Care provider:  01/08/2012  Faxed to [facility] @ [phone]  Wandra Scot, 01/08/2012, 3:40 PM

## 2012-01-14 ENCOUNTER — Encounter (HOSPITAL_COMMUNITY): Payer: Self-pay | Admitting: Emergency Medicine

## 2012-01-14 ENCOUNTER — Emergency Department (HOSPITAL_COMMUNITY)
Admission: EM | Admit: 2012-01-14 | Discharge: 2012-01-18 | Disposition: A | Discharge status: Home / Self Care | Payer: Self-pay

## 2012-01-14 DIAGNOSIS — R45851 Suicidal ideations: Secondary | ICD-10-CM | POA: Insufficient documentation

## 2012-01-14 DIAGNOSIS — Z87891 Personal history of nicotine dependence: Secondary | ICD-10-CM | POA: Insufficient documentation

## 2012-01-14 DIAGNOSIS — F329 Major depressive disorder, single episode, unspecified: Secondary | ICD-10-CM

## 2012-01-14 DIAGNOSIS — H6690 Otitis media, unspecified, unspecified ear: Secondary | ICD-10-CM | POA: Insufficient documentation

## 2012-01-14 DIAGNOSIS — H6692 Otitis media, unspecified, left ear: Secondary | ICD-10-CM

## 2012-01-14 DIAGNOSIS — F319 Bipolar disorder, unspecified: Secondary | ICD-10-CM | POA: Insufficient documentation

## 2012-01-14 DIAGNOSIS — I10 Essential (primary) hypertension: Secondary | ICD-10-CM | POA: Insufficient documentation

## 2012-01-14 LAB — RAPID URINE DRUG SCREEN, HOSP PERFORMED
Amphetamines: NOT DETECTED
Benzodiazepines: NOT DETECTED
Opiates: NOT DETECTED

## 2012-01-14 LAB — COMPREHENSIVE METABOLIC PANEL
Albumin: 3.8 g/dL (ref 3.5–5.2)
Alkaline Phosphatase: 109 U/L (ref 39–117)
BUN: 15 mg/dL (ref 6–23)
Calcium: 9.2 mg/dL (ref 8.4–10.5)
Creatinine, Ser: 0.81 mg/dL (ref 0.50–1.35)
GFR calc Af Amer: >90 mL/min (ref >90)
Glucose, Bld: 82 mg/dL (ref 70–99)
Potassium: 3.7 mEq/L (ref 3.5–5.1)
Total Protein: 6.8 g/dL (ref 6.0–8.3)

## 2012-01-14 LAB — CBC
HCT: 41.5 % (ref 39.0–52.0)
Hemoglobin: 14.3 g/dL (ref 13.0–17.0)
MCH: 30.6 pg (ref 26.0–34.0)
MCHC: 34.5 g/dL (ref 30.0–36.0)
MCV: 88.9 fL (ref 78.0–100.0)
RDW: 12.9 % (ref 11.5–15.5)

## 2012-01-14 LAB — ETHANOL: Alcohol, Ethyl (B): <11 mg/dL (ref 0–11)

## 2012-01-14 MED ORDER — AZITHROMYCIN 250 MG PO TABS
250.0000 mg | ORAL_TABLET | Freq: Every day | ORAL | Status: DC
  Administered 2012-01-15 – 2012-01-17 (×3): 250 mg via ORAL
  Filled 2012-01-14 (×3): qty 1

## 2012-01-14 MED ORDER — VITAMIN B-1 100 MG PO TABS
100.0000 mg | ORAL_TABLET | Freq: Every day | ORAL | Status: DC
  Administered 2012-01-14 – 2012-01-17 (×4): 100 mg via ORAL
  Filled 2012-01-14 (×4): qty 1

## 2012-01-14 MED ORDER — AZITHROMYCIN 250 MG PO TABS
500.0000 mg | ORAL_TABLET | Freq: Once | ORAL | Status: AC
  Administered 2012-01-14: 500 mg via ORAL
  Filled 2012-01-14: qty 2

## 2012-01-14 MED ORDER — LORAZEPAM 1 MG PO TABS
1.0000 mg | ORAL_TABLET | Freq: Three times a day (TID) | ORAL | Status: DC | PRN
  Administered 2012-01-14 – 2012-01-17 (×5): 1 mg via ORAL
  Filled 2012-01-14 (×5): qty 1

## 2012-01-14 MED ORDER — ACETAMINOPHEN 325 MG PO TABS: 650.0000 mg | ORAL_TABLET | ORAL | Status: DC | PRN

## 2012-01-14 MED ORDER — ZOLPIDEM TARTRATE 5 MG PO TABS
5.0000 mg | ORAL_TABLET | Freq: Every evening | ORAL | Status: DC | PRN
  Administered 2012-01-14 – 2012-01-17 (×4): 5 mg via ORAL
  Filled 2012-01-14 (×4): qty 1

## 2012-01-14 MED ORDER — IBUPROFEN 600 MG PO TABS
600.0000 mg | ORAL_TABLET | Freq: Three times a day (TID) | ORAL | Status: DC | PRN
  Administered 2012-01-14 – 2012-01-17 (×5): 600 mg via ORAL
  Filled 2012-01-14 (×5): qty 1

## 2012-01-14 MED ORDER — GABAPENTIN 300 MG PO CAPS
300.0000 mg | ORAL_CAPSULE | Freq: Three times a day (TID) | ORAL | Status: DC
  Administered 2012-01-14 – 2012-01-17 (×16): 300 mg via ORAL
  Filled 2012-01-14 (×21): qty 1

## 2012-01-14 MED ORDER — HYDROCODONE-ACETAMINOPHEN 5-325 MG PO TABS
1.0000 | ORAL_TABLET | Freq: Four times a day (QID) | ORAL | Status: DC | PRN
  Administered 2012-01-14: 1 via ORAL
  Filled 2012-01-14: qty 1

## 2012-01-14 MED ORDER — DULOXETINE HCL 30 MG PO CPEP
30.0000 mg | ORAL_CAPSULE | Freq: Two times a day (BID) | ORAL | Status: DC
  Administered 2012-01-14 – 2012-01-15 (×3): 30 mg via ORAL
  Filled 2012-01-14 (×4): qty 1

## 2012-01-14 MED ORDER — HYDROCODONE-ACETAMINOPHEN 5-325 MG PO TABS
1.0000 | ORAL_TABLET | Freq: Once | ORAL | Status: AC
  Administered 2012-01-14: 1 via ORAL
  Filled 2012-01-14: qty 1

## 2012-01-14 NOTE — ED Provider Notes (Addendum)
History     CSN: [id number]  Arrival date & time 01/14/12  0101   First MD Initiated Contact with Patient 01/14/12 0319      Chief Complaint  Patient presents with  . Medical Clearance  . Suicidal    (Consider location/radiation/quality/duration/timing/severity/associated sxs/prior treatment) The history is provided by the patient.   Patient is complaining of depression and suicidal thoughts. His been going for a while. He does not have a plan. He is a history of depression. No overdose. He also states that his left ear has been hurting for a while. He states his been around 4 days. No trouble hearing out of that ear. No trauma. No fevers. No headaches. No chest pain or trouble breathing. Past Medical History  Diagnosis Date  . Mental disorder   . Hypertension   . Depression   . Bipolar 1 disorder   . Irregular heart rate     History reviewed. No pertinent past surgical history.  Family History  Problem Relation Age of Onset  . Hypertension Mother   . Hypertension Other     History  Substance Use Topics  . Smoking status: Former Smoker -- 0.5 packs/day  . Smokeless tobacco: Not on file  . Alcohol Use: 21.0 oz/week    35 Cans of beer per week     Reported drank alcohol only once yesterday      Review of Systems  Constitutional: Negative for activity change and appetite change.  HENT: Positive for ear pain. Negative for neck stiffness and ear discharge.   Eyes: Negative for pain.  Respiratory: Negative for chest tightness and shortness of breath.   Cardiovascular: Negative for chest pain and leg swelling.  Gastrointestinal: Negative for nausea, vomiting, abdominal pain and diarrhea.  Genitourinary: Negative for flank pain.  Musculoskeletal: Negative for back pain.  Skin: Negative for rash.  Neurological: Negative for weakness, numbness and headaches.  Psychiatric/Behavioral: Positive for suicidal ideas and dysphoric mood. Negative for behavioral problems.     Allergies  Review of patient's allergies indicates no known allergies.  Home Medications   Current Outpatient Rx  Name Route Sig Dispense Refill  . DULOXETINE HCL 30 MG PO CPEP Oral Take 1 capsule (30 mg total) by mouth 2 (two) times daily. For depression and pain management. 60 capsule 0  . GABAPENTIN 300 MG PO CAPS Oral Take 1 capsule (300 mg total) by mouth 4 (four) times daily -  before meals and at bedtime. For anxiety 120 capsule 0  . METHOCARBAMOL 500 MG PO TABS Oral Take 1 tablet (500 mg total) by mouth 3 (three) times daily. For back spasms. 90 tablet 0  . ADULT MULTIVITAMIN W/MINERALS CH Oral Take 1 tablet by mouth daily. For nutritional supplementation. 30 tablet 0  . QUETIAPINE FUMARATE 50 MG PO TABS  Take by mouth 1 twice a day for anxiety and 2 at bedtime for insomnia and anxiety 120 tablet 0  . THIAMINE HCL 100 MG PO TABS Oral Take 1 tablet (100 mg total) by mouth daily. For nutritional supplementation. 30 tablet 0    BP 126/81  Pulse 69  Temp 98.1 F (36.7 C) (Oral)  Resp 16  Ht 6\' 1"  (1.854 m)  Wt 290 lb (131.543 kg)  BMI 38.26 kg/m2  SpO2 98%  Physical Exam  Nursing note and vitals reviewed. Constitutional: He is oriented to person, place, and time. He appears well-developed and well-nourished.  HENT:  Head: Normocephalic and atraumatic.       Left  TM erythematous with purulent effusion no tenderness over mastoid.  Eyes: Pupils are equal, round, and reactive to light.       Patient with disconjugate gaze with left eye deviated laterally. This is chronic per the patient.  Neck: Normal range of motion. Neck supple.  Cardiovascular: Normal rate, regular rhythm and normal heart sounds.   No murmur heard. Pulmonary/Chest: Effort normal and breath sounds normal.  Abdominal: Soft. Bowel sounds are normal. He exhibits no distension and no mass. There is no tenderness. There is no rebound and no guarding.  Musculoskeletal: Normal range of motion. He exhibits no  edema.  Neurological: He is alert and oriented to person, place, and time. No cranial nerve deficit.  Skin: Skin is warm and dry.  Psychiatric:       Patient appears somewhat depressed.    ED Course  Procedures (including critical care time)   Labs Reviewed  CBC  COMPREHENSIVE METABOLIC PANEL  ETHANOL  URINE RAPID DRUG SCREEN (HOSP PERFORMED)   No results found.   1. Depression   2. Suicidal ideations   3. Otitis media of left ear       MDM  Patient with depression and suicidal thoughts. Also found to have a left-sided otitis media. Antibiotics have been started. Patient appears to medically cleared as did seen by the ACT team.        Juliet Rude. Rubin Payor, MD 01/14/12 [phone]  Patient sleeping comfortably. Has been declined at [facility]. Under review at [facility].  Juliet Rude. Rubin Payor, MD 01/17/12 [phone]

## 2012-01-14 NOTE — ED Notes (Signed)
Pt states he restarted medications 1 wk ago, after being off of meds. X 1 year.  He states he was started back on medications through [facility].

## 2012-01-14 NOTE — ED Notes (Signed)
Pt changed into blue paper scrubs. Pt and belongings wanded by security. Pt belongings, 1 pt belongings bag, locked in triage locker #4.

## 2012-01-14 NOTE — ED Notes (Signed)
Pt continues to c/o of ear pain. RN Barbara Cower notified.

## 2012-01-14 NOTE — ED Notes (Signed)
Pt laying down watching TV

## 2012-01-14 NOTE — ED Notes (Signed)
Pt c/o Lt ear pain x 4 days. Denies cough, runny nose/ post nasal drip, or drainage from ear. He admits to throbbing pain, 10/10 and a muffled sounds.

## 2012-01-14 NOTE — BH Assessment (Addendum)
Assessment Note   [name] [name] is an 31 y.o. male who presents voluntarily to [facility]. Pt called EMS because of SI. He says SI began 01/13/12. Pt denies plan. He endorses depressed mood including insomnia, fatigue, isolating, loss of interest, tearfulness and irritability. He is unable to contract for safety. He says, "I don't know what to do." Pt endorses moderate anxiety. Pt had one suicide attempt in 2012. Pt endorses AH. Pt states that he hears his deceased mother's voice but he unable to make out her words. He reports his mother died Dec 15, 2012and his depression has increased since then. Pt goes to [facility] for med management and his next appt is Tues 01/16/12. He is compliant with his psych meds. Pt was discharged from [facility] on 01/06/12. He was also inpatient in 2012 [facility] twice and [facility] for depression/SI. Pt denies HI and no delusions noted. 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 He moved in with his sister last week and can return to her home.  Axis I: Major Depressive Disorder, Recurrent, Severe with Psychotic Features Axis II: Deferred Axis III:  Past Medical History  Diagnosis Date  . Mental disorder   . Hypertension   . Depression   . Bipolar 1 disorder   . Irregular heart rate    Axis IV: occupational problems, other psychosocial or environmental problems, problems related to social environment and problems with primary support group Axis V: 31-40 impairment in reality testing  Past Medical History:  Past Medical History  Diagnosis Date  . Mental disorder   . Hypertension   . Depression   . Bipolar 1 disorder   . Irregular heart rate     History reviewed. No pertinent past surgical history.  Family History:  Family History  Problem Relation Age of Onset  . Hypertension Mother   . Hypertension Other     Social History:  reports that he has quit smoking. He does not have any smokeless tobacco history  on file. He reports that he drinks about 21 ounces of alcohol per week. He reports that he does not use illicit drugs.  Additional Social History:  Alcohol / Drug Use Pain Medications: n/a Prescriptions: taken as prescribed Over the Counter: na History of alcohol / drug use?: Yes Substance #1 Name of Substance 1: alcohol 1 - Age of First Use: 18 1 - Amount (size/oz): 5-6 beers 1 - Frequency: rarely 1 - Duration: drank 2 weeks straight as a sleep aid until stopped 12/28/11  1 - Last Use / Amount: 12/28/11 - 5-6 beers  CIWA: CIWA-Ar BP: 119/78 mmHg Pulse Rate: 81  COWS:    Allergies: No Known Allergies  Home Medications:  (Not in a hospital admission)  OB/GYN Status:  No LMP for male patient.  General Assessment Data Location of Assessment: [facility] ED Living Arrangements: Other relatives (sister) Can pt return to current living arrangement?: Yes Admission Status: Voluntary Is patient capable of signing voluntary admission?: Yes Transfer from: Home Referral Source: Self/Family/Friend (pt called EMS)  Education Status Is patient currently in school?: No Current Grade: na Highest grade of school patient has completed: 4  Risk to self Suicidal Ideation: Yes-Currently Present Suicidal Intent: No Is patient at risk for suicide?: Yes Suicidal Plan?: No Access to Means: No What has been your use of drugs/alcohol within the last 12 months?: alcohol for 2 weeks until 12/28/11 Previous Attempts/Gestures: Yes How many times?:  1  (in 2012) Other Self Harm Risks: none Triggers for Past Attempts: Other (Comment) (depression) Intentional Self Injurious Behavior: None Family Suicide History: No Recent stressful life event(s): Job Loss;Loss (Comment) (mom died 06/01/11) Persecutory voices/beliefs?: No Depression: Yes Depression Symptoms: Loss of interest in usual pleasures;Feeling angry/irritable;Fatigue;Isolating;Tearfulness;Insomnia Substance abuse history and/or treatment for  substance abuse?: No Suicide prevention information given to non-admitted patients: Not applicable  Risk to Others Homicidal Ideation: No Thoughts of Harm to Others: No Current Homicidal Intent: No Current Homicidal Plan: No Access to Homicidal Means: No Identified Victim: none History of harm to others?: No Assessment of Violence: None Noted Violent Behavior Description: pt calm and cooperative Does patient have access to weapons?: No Criminal Charges Pending?: No Does patient have a court date: No  Psychosis Hallucinations: Auditory Delusions: None noted  Mental Status Report Appear/Hygiene: Other (Comment) (unremarkable) Eye Contact: Good Motor Activity: Freedom of movement Speech: Logical/coherent Level of Consciousness: Alert Mood: Depressed;Sad Affect: Appropriate to circumstance;Depressed Anxiety Level: Moderate Thought Processes: Relevant;Coherent Judgement: Impaired Orientation: Person;Place;Time;Situation Obsessive Compulsive Thoughts/Behaviors: None  Cognitive Functioning Concentration: Normal Memory: Recent Impaired;Remote Impaired IQ: Average Insight: Fair Impulse Control: Fair Appetite: Fair Weight Loss: 0  Weight Gain: 0  Sleep: Decreased Total Hours of Sleep: 3  Vegetative Symptoms: None  ADLScreening [facility] Assessment Services) Patient's cognitive ability adequate to safely complete daily activities?: Yes Patient able to express need for assistance with ADLs?: Yes Independently performs ADLs?: Yes  Abuse/Neglect [facility]) Physical Abuse: Denies Verbal Abuse: Denies Sexual Abuse: Denies  Prior Inpatient Therapy Prior Inpatient Therapy: Yes Prior Therapy Dates: 2012 Prior Therapy Facilty/Provider(s): [facility] twice, [facility] Reason for Treatment: depression/SI  Prior Outpatient Therapy Prior Outpatient Therapy: Yes Prior Therapy Dates: currently Prior Therapy Facilty/Provider(s): [facility] Reason for Treatment: med management  ADL Screening  (condition at time of admission) Patient's cognitive ability adequate to safely complete daily activities?: Yes Patient able to express need for assistance with ADLs?: Yes Independently performs ADLs?: Yes Weakness of Legs: None Weakness of Arms/Hands: None  Home Assistive Devices/Equipment Home Assistive Devices/Equipment: None    Abuse/Neglect Assessment (Assessment to be complete while patient is alone) Physical Abuse: Denies Verbal Abuse: Denies Sexual Abuse: Denies Exploitation of patient/patient's resources: Denies Self-Neglect: Denies Values / Beliefs Cultural Requests During Hospitalization: None Spiritual Requests During Hospitalization: None   Advance Directives (For Healthcare) Advance Directive: Patient does not have advance directive;Patient would not like information    Additional Information 1:1 In Past 12 Months?: No CIRT Risk: No Elopement Risk: No Does patient have medical clearance?: Yes     Disposition:  Disposition Disposition of Patient: Inpatient treatment program;Outpatient treatment Type of inpatient treatment program: Adult Type of outpatient treatment: Adult  On Site Evaluation by:   Reviewed with Physician:     Donnamarie Rossetti P 01/14/2012 4:21 AM

## 2012-01-14 NOTE — ED Notes (Signed)
MD bedside

## 2012-01-14 NOTE — ED Notes (Signed)
Up to bathroom

## 2012-01-14 NOTE — ED Provider Notes (Addendum)
Pt alert, content, vitals normal, nad.  telepsych eval and act placement pending.     Suzi Roots, MD 01/14/12 1023    Pt alert, content. Nursing notes pt c/o left ear pain. On review initial h and p was dx w om, but no ongoing abx therapy ordered. On recheck pt w otitis externa w mild purulent drainage. Unable to visualize tm. No mastoid tenderness. Pt denies headache. No fever/chills. Confirmed nkda w pt. Will rx.   Awaiting act placement.  Psychiatrist to round on pt later today.   Suzi Roots, MD 01/16/12 1046

## 2012-01-14 NOTE — ED Notes (Signed)
Pt alert, arrives from home, c/o SI, onset today, denies plan, hx of depression, ambulates to triage, steady gait noted

## 2012-01-14 NOTE — ED Notes (Signed)
Bed:WTR6<BR> Expected date:<BR> Expected time:<BR> Means of arrival:<BR> Comments:<BR> closed

## 2012-01-14 NOTE — ED Notes (Signed)
Pt given items to shower. 

## 2012-01-14 NOTE — ED Notes (Signed)
[facility] bedside 

## 2012-01-15 MED ORDER — HYDROCODONE-ACETAMINOPHEN 5-325 MG PO TABS
2.0000 | ORAL_TABLET | Freq: Four times a day (QID) | ORAL | Status: DC | PRN
  Administered 2012-01-15 – 2012-01-17 (×6): 2 via ORAL
  Filled 2012-01-15 (×6): qty 2

## 2012-01-15 MED ORDER — DULOXETINE HCL 20 MG PO CPEP
40.0000 mg | ORAL_CAPSULE | Freq: Two times a day (BID) | ORAL | Status: DC
  Administered 2012-01-15 – 2012-01-17 (×4): 40 mg via ORAL
  Filled 2012-01-15 (×5): qty 2

## 2012-01-15 MED ORDER — ANTIPYRINE-BENZOCAINE 5.4-1.4 % OT SOLN
3.0000 [drp] | OTIC | Status: DC | PRN
  Administered 2012-01-16: 4 [drp] via OTIC
  Administered 2012-01-16: 3 [drp] via OTIC
  Filled 2012-01-15: qty 10

## 2012-01-15 NOTE — Consult Note (Signed)
Reason for Consult: Maj. depressive disorder, recent loss of job Referring Physician: Dr. Burgess Amor [name] [name] is an 31 y.o. male.  HPI: Patient was seen and chart reviewed patient has been suffering with the Maj. depressive disorder, recurrent presented voluntarily to the [facility] long emergency department feeling more depressed and has passive suicidal ideation, which began after he had a conflict with his the boss at work and and then filed from the job. Patient reported he was not good as per the his course and also reportedly no support from a friend. She stayed with his sister, but did not feel good about himself and his sister's husband was not supportive to him. He had a previous history of suicidal attempt in 2012, but stopped by his mother. Patient reported his mother passed away in 2004-12-24with the multiple health problems. Patient also endorses a history of for alcohol, drinking, and smoking. Patient endorses a compliance with his medication. Patient has 20 years old child with his mother. Patient was never married patient currently feels depressed, worthless, helpless, and hopeless. Patient has no homicidal ideation or psychotic symptoms. Patient reported he cannot contract for safety during this visit. Patient agreed to increase his medication for better control of his depression.  Patient reported he was initially treated for depression, June 2012, but later stopped taking medication in November 2012because feeling better. Patient started having symptoms of depression after his mom passed away. 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  Past Medical History  Diagnosis Date  . Mental disorder   . Hypertension   . Depression   . Bipolar 1 disorder   . Irregular heart rate     History reviewed. No pertinent past  surgical history.  Family History  Problem Relation Age of Onset  . Hypertension Mother   . Hypertension Other     Social History:  reports that he has quit smoking. He does not have any smokeless tobacco history on file. He reports that he drinks about 21 ounces of alcohol per week. He reports that he does not use illicit drugs.  Allergies: No Known Allergies  Medications: I have reviewed the patient's current medications.  Results for orders placed during the hospital encounter of 01/14/12 (from the past 48 hour(s))  CBC     Status: Normal   Collection Time   01/14/12  1:24 AM      Component Value Range Comment   WBC 8.2  4.0 - 10.5 K/uL    RBC 4.67  4.22 - 5.81 MIL/uL    Hemoglobin 14.3  13.0 - 17.0 g/dL    HCT 45.4  09.8 - 11.9 %    MCV 88.9  78.0 - 100.0 fL    MCH 30.6  26.0 - 34.0 pg    MCHC 34.5  30.0 - 36.0 g/dL    RDW 14.7  82.9 - 56.2 %    Platelets 269  150 - 400 K/uL   COMPREHENSIVE METABOLIC PANEL     Status: Normal   Collection Time   01/14/12  1:24 AM      Component Value Range Comment   Sodium 138  135 - 145 mEq/L    Potassium 3.7  3.5 - 5.1 mEq/L    Chloride 103  96 - 112 mEq/L  CO2 26  19 - 32 mEq/L    Glucose, Bld 82  70 - 99 mg/dL    BUN 15  6 - 23 mg/dL    Creatinine, Ser 1.61  0.50 - 1.35 mg/dL    Calcium 9.2  8.4 - 09.6 mg/dL    Total Protein 6.8  6.0 - 8.3 g/dL    Albumin 3.8  3.5 - 5.2 g/dL    AST 17  0 - 37 U/L    ALT 21  0 - 53 U/L    Alkaline Phosphatase 109  39 - 117 U/L    Total Bilirubin 0.7  0.3 - 1.2 mg/dL    GFR calc non Af Amer >90  >90 mL/min    GFR calc Af Amer >90  >90 mL/min   ETHANOL     Status: Normal   Collection Time   01/14/12  1:24 AM      Component Value Range Comment   Alcohol, Ethyl (B) <11  0 - 11 mg/dL   URINE RAPID DRUG SCREEN (HOSP PERFORMED)     Status: Normal   Collection Time   01/14/12  3:03 AM      Component Value Range Comment   Opiates NONE DETECTED  NONE DETECTED    Cocaine NONE DETECTED  NONE DETECTED     Benzodiazepines NONE DETECTED  NONE DETECTED    Amphetamines NONE DETECTED  NONE DETECTED    Tetrahydrocannabinol NONE DETECTED  NONE DETECTED    Barbiturates NONE DETECTED  NONE DETECTED     No results found.  No psychosis and Positive for bad mood, depression, sleep disturbance and poor social support, and coping skills and and bad grades in to pocket Blood pressure 118/80, pulse 84, temperature 97.4 F (36.3 C), temperature source Oral, resp. rate 18, height 6\' 1"  (1.854 m), weight 290 lb (131.543 kg), SpO2 95.00%.   Assessment/Plan: Maj. depressive disorder, recurrent , Without psychotic features  Patient cannot be contracting for safety and will pursue inpatient psychiatric hospitalization and at the same time adjusting his medication Cymbalta to 40 mg once daily and continue his Neurontin 300 mg 3 times a day. If patient can contract for safety  tomorrow morning he may refer to outpatient psychiatric services.  Kenlyn Lose,[name]. 01/15/2012, 5:06 PM

## 2012-01-15 NOTE — BH Assessment (Signed)
[facility] Assessment Progress Note      Pt was reassessed today to determine ability to contract for safety.  Pt was able to be released if able to contract for safety, but pt declines ability.  "I am feeling worthless and hopeless because I lost my job and my supports."  Pt also states that he has been hearing voices of his deceased mother which makes his depression worse.  Pt was discharged from [facility] on 01/06/12 and was fired from his job on the 10th.  This has caused increased worthlessness and hopelessness and pt feels that he needs to be re-hospitalized to address his most current symptoms.  Pt's Cymbalta has been increased to 40 mg from 30 mg per Dr. Marguarite Arbour, which he voices compliance with since his discharge.  Pt also takes Gabapentin (300mg  TID).  Pt will need to be evaluated for possible inpatient treatment at [facility] per Dr. Marguarite Arbour and MD.

## 2012-01-16 MED ORDER — NEOMYCIN-POLYMYXIN-HC 3.5-10000-1 OT SUSP
4.0000 [drp] | Freq: Four times a day (QID) | OTIC | Status: DC
  Administered 2012-01-16 – 2012-01-18 (×7): 4 [drp] via OTIC
  Filled 2012-01-16: qty 10

## 2012-01-16 MED ORDER — NEOMYCIN-COLIST-HC-THONZONIUM 3.3-3-10-0.5 MG/ML OT SUSP: 4.0000 [drp] | Freq: Four times a day (QID) | OTIC | Status: DC

## 2012-01-16 MED ORDER — CIPROFLOXACIN HCL 500 MG PO TABS
750.0000 mg | ORAL_TABLET | Freq: Two times a day (BID) | ORAL | Status: DC
  Administered 2012-01-16 – 2012-01-17 (×4): 750 mg via ORAL
  Filled 2012-01-16 (×4): qty 1

## 2012-01-16 NOTE — ED Notes (Signed)
MD at bedside. 

## 2012-01-16 NOTE — BH Assessment (Signed)
Assessment Note   [name] [name] is an 31 y.o. male. PT PRESENTED A FEW DAYS AGO FOR INCREASE DEPRESSION & SUICIDAL THOUGHTS AFTER BEING FIRED FROM HIS JOB. PT ADMITS TO THOUGHTS OF OVERDOSING ON PILLS. PT DENIES ANY HI. PT EXPRESSED FEELING ABANDONED BY LOVE ONES WHEN HE REALLY NEEDED THEM. PT ADMITS LOSING INTEREST IN LIFE & FEELS HIS LOVE ONES WILL BE BETTER OFF WITHOUT HIM IF HE IS DEAD. PT SAYS HE IS ALSO HEARING VOICES BUT NO COMMAND. PT IS NOT ABLE TO CONTRACT FOR SAFETY AT THIS TIME & HAS BEEN REFERRED TO [facility] & PENDING DISPOSITION.   Axis I: Major Depression, Recurrent severe Axis II: Deferred Axis III:  Past Medical History  Diagnosis Date  . Mental disorder   . Hypertension   . Depression   . Bipolar 1 disorder   . Irregular heart rate    Axis IV: economic problems, occupational problems, other psychosocial or environmental problems, problems related to social environment and problems with primary support group Axis V: 11-20 some danger of hurting self or others possible OR occasionally fails to maintain minimal personal hygiene OR gross impairment in communication  Past Medical History:  Past Medical History  Diagnosis Date  . Mental disorder   . Hypertension   . Depression   . Bipolar 1 disorder   . Irregular heart rate     History reviewed. No pertinent past surgical history.  Family History:  Family History  Problem Relation Age of Onset  . Hypertension Mother   . Hypertension Other     Social History:  reports that he has quit smoking. He does not have any smokeless tobacco history on file. He reports that he drinks about 21 ounces of alcohol per week. He reports that he does not use illicit drugs.  Additional Social History:  Alcohol / Drug Use Pain Medications: n/a Prescriptions: taken as prescribed Over the Counter: na History of alcohol / drug use?: Yes Substance #1 Name of Substance 1: alcohol 1 - Age of First Use: 18 1 - Amount (size/oz): 5-6  beers 1 - Frequency: rarely 1 - Duration: drank 2 weeks straight as a sleep aid until stopped 12/28/11  1 - Last Use / Amount: 12/28/11 - 5-6 beers  CIWA: CIWA-Ar BP: 116/74 mmHg Pulse Rate: 83  COWS:    Allergies: No Known Allergies  Home Medications:  (Not in a hospital admission)  OB/GYN Status:  No LMP for male patient.  General Assessment Data Location of Assessment: [facility] Assessment: Yes Living Arrangements: Other relatives Can pt return to current living arrangement?: Yes Admission Status: Voluntary Is patient capable of signing voluntary admission?: Yes Transfer from: Acute Hospital Referral Source: Self/Family/Friend  Education Status Is patient currently in school?: No Current Grade: na Highest grade of school patient has completed: 4  Risk to self Suicidal Ideation: Yes-Currently Present Suicidal Intent: No-Not Currently/Within Last 6 Months Is patient at risk for suicide?: Yes Suicidal Plan?: Yes-Currently Present Specify Current Suicidal Plan: od Access to Means: Yes Specify Access to Suicidal Means: pills What has been your use of drugs/alcohol within the last 12 months?: abusing etoh 2 weeks ago Previous Attempts/Gestures: Yes How many times?: 1  Other Self Harm Risks: na Triggers for Past Attempts: Unpredictable Intentional Self Injurious Behavior: None Family Suicide History: No Recent stressful life event(s): Conflict (Comment);Job Loss;Financial Problems;Turmoil (Comment) Persecutory voices/beliefs?: No Depression: Yes Depression Symptoms: Loss of interest in usual pleasures;Feeling worthless/self pity;Isolating Substance abuse history and/or treatment for substance  abuse?: No Suicide prevention information given to non-admitted patients: Not applicable  Risk to Others Homicidal Ideation: No Thoughts of Harm to Others: No Current Homicidal Intent: No Current Homicidal Plan: No Access to Homicidal Means: No Identified Victim: na History  of harm to others?: No Assessment of Violence: None Noted Violent Behavior Description: calm, cooperative Does patient have access to weapons?: No Criminal Charges Pending?: No Does patient have a court date: No  Psychosis Hallucinations: Auditory Delusions: None noted  Mental Status Report Appear/Hygiene: Other (Comment) (remarkable) Eye Contact: Good Motor Activity: Freedom of movement Speech: Logical/coherent Level of Consciousness: Alert Mood: Depressed;Sad;Anhedonia;Worthless, low self-esteem;Sullen Affect: Appropriate to circumstance;Depressed;Sad Anxiety Level: None Thought Processes: Coherent;Relevant Judgement: Unimpaired Orientation: Person;Place;Time;Situation Obsessive Compulsive Thoughts/Behaviors: None  Cognitive Functioning Concentration: Decreased Memory: Recent Intact;Remote Intact IQ: Average Insight: Poor Impulse Control: Fair Appetite: Fair Weight Loss: 0  Weight Gain: 0  Sleep: Decreased Total Hours of Sleep: 4  Vegetative Symptoms: None  ADLScreening [facility]) Patient's cognitive ability adequate to safely complete daily activities?: Yes Patient able to express need for assistance with ADLs?: Yes Independently performs ADLs?: Yes  Abuse/Neglect [facility]) Physical Abuse: Denies Verbal Abuse: Denies Sexual Abuse: Denies  Prior Inpatient Therapy Prior Inpatient Therapy: Yes Prior Therapy Dates: 2012 Prior Therapy Facilty/Provider(s): [facility]; [facility] Reason for Treatment: STABILIZATION  Prior Outpatient Therapy Prior Outpatient Therapy: Yes Prior Therapy Dates: CURRENTLY Prior Therapy Facilty/Provider(s): [facility] Reason for Treatment: MED MANAGEMENT  ADL Screening (condition at time of admission) Patient's cognitive ability adequate to safely complete daily activities?: Yes Patient able to express need for assistance with ADLs?: Yes Independently performs ADLs?: Yes Weakness of Legs: None Weakness of Arms/Hands:  None  Home Assistive Devices/Equipment Home Assistive Devices/Equipment: None    Abuse/Neglect Assessment (Assessment to be complete while patient is alone) Physical Abuse: Denies Verbal Abuse: Denies Sexual Abuse: Denies Exploitation of patient/patient's resources: Denies Self-Neglect: Denies Values / Beliefs Cultural Requests During Hospitalization: None Spiritual Requests During Hospitalization: None   Advance Directives (For Healthcare) Advance Directive: Patient does not have advance directive;Patient would not like information Nutrition Screen Diet: Regular (Peanut butter, graham crackers and soda given to patient.)  Additional Information 1:1 In Past 12 Months?: Yes CIRT Risk: No Elopement Risk: No Does patient have medical clearance?: Yes     Disposition:  Disposition Disposition of Patient: Inpatient treatment program;Referred to [facility]: PENDING DISPOSITION) Type of inpatient treatment program: Adult Type of outpatient treatment: Adult  On Site Evaluation by:   Reviewed with Physician:     Waldron Session 01/16/2012 10:45 AM

## 2012-01-17 DIAGNOSIS — F332 Major depressive disorder, recurrent severe without psychotic features: Secondary | ICD-10-CM

## 2012-01-17 DIAGNOSIS — R45851 Suicidal ideations: Secondary | ICD-10-CM

## 2012-01-17 MED ORDER — DULOXETINE HCL 60 MG PO CPEP
60.0000 mg | ORAL_CAPSULE | Freq: Two times a day (BID) | ORAL | Status: DC
  Administered 2012-01-17: 60 mg via ORAL
  Filled 2012-01-17 (×3): qty 1

## 2012-01-17 MED ORDER — CLONAZEPAM 1 MG PO TABS
1.0000 mg | ORAL_TABLET | Freq: Two times a day (BID) | ORAL | Status: DC
  Administered 2012-01-17: 1 mg via ORAL
  Filled 2012-01-17: qty 1

## 2012-01-17 NOTE — Consult Note (Signed)
Reason for Consult: Depression, and suicidal ideation Referring Physician: Dr. Burgess Amor [name] [name] is an 31 y.o. male.  HPI: Patient continued to be depressed anxious sad, isolated, withdrawn, and has low self-esteem. Patient cannot contract for safety at this time. Patient seeking treatment for his depression and anxiety. Patient has no homicidal ideation, intention, or plan. Patient has no evidence of psychotic symptoms. 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 He was not a bright student, but it is not clear if he has any mental retardation or borderline intelligence. Patient reported his mother is only oneplease the condition, who passed away 6 months ago. Patient has a sister who was not the in communication with him.  Past Medical History  Diagnosis Date  . Mental disorder   . Hypertension   . Depression   . Bipolar 1 disorder   . Irregular heart rate     History reviewed. No pertinent past surgical history.  Family History  Problem Relation Age of Onset  . Hypertension Mother   . Hypertension Other     Social History:  reports that he has quit smoking. He does not have any smokeless tobacco history on file. He reports that he drinks about 21 ounces of alcohol per week. He reports that he does not use illicit drugs.  Allergies: No Known Allergies  Medications: I have reviewed the patient's current medications.  No results found for this or any previous visit (from the past 48 hour(s)).  No results found.  No psychosis and Positive for anxiety, bad mood, depression, learning difficulty and sleep disturbance Blood pressure 123/78, pulse 71, temperature 98.3 F (36.8 C), resp. rate 18, height 6\' 1"  (1.854 m), weight 290 lb (131.543 kg), SpO2 96.00%.   Assessment/Plan: Maj. depressive disorder, recurrent severe with suicidal ideation.  Will increase his Cymbalta to 60 mg once daily and also add clonazepam 1 mg twice daily to control his anxiety. We'll  continue seek for the inpatient psychiatric hospitalization at this time.  [name] [name],[name] R. 01/17/2012, 5:01 PM

## 2012-01-18 ENCOUNTER — Emergency Department (HOSPITAL_COMMUNITY)
Admission: EM | Admit: 2012-01-18 | Discharge: 2012-01-18 | Disposition: A | Discharge status: Home / Self Care | Payer: Self-pay | Attending: Emergency Medicine | Admitting: Emergency Medicine

## 2012-01-18 ENCOUNTER — Encounter (HOSPITAL_COMMUNITY): Payer: Self-pay

## 2012-01-18 DIAGNOSIS — H6090 Unspecified otitis externa, unspecified ear: Secondary | ICD-10-CM

## 2012-01-18 DIAGNOSIS — I10 Essential (primary) hypertension: Secondary | ICD-10-CM | POA: Insufficient documentation

## 2012-01-18 DIAGNOSIS — R45851 Suicidal ideations: Secondary | ICD-10-CM | POA: Insufficient documentation

## 2012-01-18 DIAGNOSIS — Z79899 Other long term (current) drug therapy: Secondary | ICD-10-CM | POA: Insufficient documentation

## 2012-01-18 DIAGNOSIS — H60399 Other infective otitis externa, unspecified ear: Secondary | ICD-10-CM | POA: Insufficient documentation

## 2012-01-18 MED ORDER — OXYCODONE-ACETAMINOPHEN 5-325 MG PO TABS: 1.0000 | ORAL_TABLET | ORAL | Status: AC | PRN

## 2012-01-18 MED ORDER — CIPROFLOXACIN HCL 750 MG PO TABS: 500.0000 mg | ORAL_TABLET | Freq: Two times a day (BID) | ORAL | Status: DC

## 2012-01-18 MED ORDER — AMOXICILLIN 500 MG PO CAPS: 500.0000 mg | ORAL_CAPSULE | Freq: Three times a day (TID) | ORAL | Status: AC

## 2012-01-18 MED ORDER — CIPROFLOXACIN HCL 500 MG PO TABS: 500.0000 mg | ORAL_TABLET | Freq: Two times a day (BID) | ORAL | Status: DC

## 2012-01-18 MED ORDER — CIPROFLOXACIN-DEXAMETHASONE 0.3-0.1 % OT SUSP
4.0000 [drp] | Freq: Two times a day (BID) | OTIC | Status: DC
  Administered 2012-01-18: 4 [drp] via OTIC
  Filled 2012-01-18: qty 7.5

## 2012-01-18 NOTE — ED Notes (Signed)
Telepsych performed and recommendation is for patient to follow up with an outpatient psychiatrist. Pt frequently receives this recommendation but does not follow through as scheduled. Pt has been recently discharged from [facility] but has not followed those recommendations, either.

## 2012-01-18 NOTE — ED Provider Notes (Signed)
History  Scribed for No att. providers found, the patient was seen in room TR08C/TR08C. This chart was scribed by Candelaria Stagers. The patient's care started at 2:48 PM   CSN: [id number]  Arrival date & time 01/18/12  1358   None     Chief Complaint  Patient presents with  . Suicidal     The history is provided by the patient.   [name] [name] is a 31 y.o. male who presents to the Emergency Department complaining of otalgia of the left ear that started about 5-6 days ago.  Pt was discharged from [facility] this morning for suicidal ideations.  He reports he is still having suicidal thoughts.  Pt reports hearing voices, thoughts of suicide, and homicidal thoughts.    Past Medical History  Diagnosis Date  . Mental disorder   . Hypertension   . Depression   . Bipolar 1 disorder   . Irregular heart rate     History reviewed. No pertinent past surgical history.  Family History  Problem Relation Age of Onset  . Hypertension Mother   . Hypertension Other     History  Substance Use Topics  . Smoking status: Former Smoker -- 0.5 packs/day  . Smokeless tobacco: Not on file  . Alcohol Use: 21.0 oz/week    35 Cans of beer per week     Reported drank alcohol only once yesterday      Review of Systems  Constitutional: Negative for fever.       10 Systems reviewed and are negative for acute change except as noted in the HPI.  HENT: Positive for ear pain (left ear pain). Negative for congestion.   Eyes: Negative for discharge and redness.  Respiratory: Negative for cough and shortness of breath.   Cardiovascular: Negative for chest pain.  Gastrointestinal: Negative for vomiting and abdominal pain.  Musculoskeletal: Negative for back pain.  Skin: Negative for rash.  Neurological: Negative for syncope, numbness and headaches.  Psychiatric/Behavioral: Positive for suicidal ideas.       No behavior change.    Allergies  Review of patient's allergies indicates no known  allergies.  Home Medications   Current Outpatient Rx  Name Route Sig Dispense Refill  . DULOXETINE HCL 30 MG PO CPEP Oral Take 1 capsule (30 mg total) by mouth 2 (two) times daily. For depression and pain management. 60 capsule 0  . GABAPENTIN 300 MG PO CAPS Oral Take 1 capsule (300 mg total) by mouth 4 (four) times daily -  before meals and at bedtime. For anxiety 120 capsule 0  . ADULT MULTIVITAMIN W/MINERALS CH Oral Take 1 tablet by mouth daily. For nutritional supplementation. 30 tablet 0  . QUETIAPINE FUMARATE 50 MG PO TABS  Take by mouth 1 twice a day for anxiety and 2 at bedtime for insomnia and anxiety 120 tablet 0  . THIAMINE HCL 100 MG PO TABS Oral Take 1 tablet (100 mg total) by mouth daily. For nutritional supplementation. 30 tablet 0    BP 121/77  Pulse 131  Temp 97.9 F (36.6 C) (Oral)  Resp 16  Ht 6\' 1"  (1.854 m)  Wt 290 lb (131.543 kg)  BMI 38.26 kg/m2  SpO2 98%  Physical Exam  Nursing note and vitals reviewed. Constitutional:       Awake, alert, nontoxic appearance.  HENT:  Head: Atraumatic.       Left otitis externa, large amount of debris in canal.  TM unable to be visual before cleaning the  ear, after cleaning TM visualized, inflamed and intact.  No mastoid tenderness.      Eyes: Right eye exhibits no discharge. Left eye exhibits no discharge.  Neck: Neck supple.  Pulmonary/Chest: Effort normal. He exhibits no tenderness.  Abdominal: Soft. There is no tenderness. There is no rebound.  Musculoskeletal: He exhibits no tenderness.       Baseline ROM, no obvious new focal weakness.  Neurological:       Mental status and motor strength appears baseline for patient and situation.  Skin: No rash noted.  Psychiatric: He has a normal mood and affect.    ED Course  Procedures  DIAGNOSTIC STUDIES: Oxygen Saturation is 98% on room air, normal by my interpretation.    COORDINATION OF CARE:    Labs Reviewed - No data to display No results found.   1.  Otitis externa       MDM  31yM with otitis externa. Just discharged from [facility] ED. Claims wasn't provided with abx on DC. Large amount of debris in ear canal. Gently debrided w/ frasier catheter and curette. TM partially visualized appears intact. Canal edema but not to point requiring placement of wick. Hopefully topical abx will now be more effective. No signs of malignant otitis. Pt given dose of otic abx in ED and sent home with them. Pt also with psych issues but was just discharged for same. No acute change since discharged. Pt has not followed up with community resources despite many psych hospitalizations. Encouraged to actually do this.  I personally preformed the services scribed in my presence. The recorded information has been reviewed and considered. Raeford Razor, MD.        Raeford Razor, MD 01/26/12 [phone]

## 2012-01-18 NOTE — ED Notes (Signed)
Pt verbalizes understanding regarding discharge instructions and referrals given by ACT. Pt now states he is ready to go and is requesting to leave early so he can catch the bus.

## 2012-01-18 NOTE — ED Notes (Signed)
Pt presents with 5-6 day h/o L ear pain.  Pt reports he was discharged from [facility] this morning for suicidal ideations, pt reports he is still having thoughts of jumping off of a bridge or jumping out infront of a car.  Pt reports hearing his mother's voice (she is deceased).  Pt reports homicidal thoughts against "people who got me fired".  Pt reports drainage from L ear.

## 2012-01-18 NOTE — ED Notes (Signed)
ACT recommended a telepsych. Pt affect incongruent with his complaints, pt smiles and very pleasant towards staff, frequently requesting snacks and beverages, watching TV in room. Pt continues on ABT for ear infection, with PRN pain meds and ear drops.

## 2012-02-01 ENCOUNTER — Encounter (HOSPITAL_COMMUNITY): Payer: Self-pay | Admitting: *Deleted

## 2012-02-01 ENCOUNTER — Emergency Department (HOSPITAL_COMMUNITY)
Admission: EM | Admit: 2012-02-01 | Discharge: 2012-02-01 | Disposition: A | Discharge status: Home / Self Care | Payer: Self-pay | Attending: Emergency Medicine | Admitting: Emergency Medicine

## 2012-02-01 ENCOUNTER — Emergency Department (HOSPITAL_COMMUNITY): Payer: Self-pay

## 2012-02-01 DIAGNOSIS — H6092 Unspecified otitis externa, left ear: Secondary | ICD-10-CM

## 2012-02-01 DIAGNOSIS — I10 Essential (primary) hypertension: Secondary | ICD-10-CM | POA: Insufficient documentation

## 2012-02-01 DIAGNOSIS — G43909 Migraine, unspecified, not intractable, without status migrainosus: Secondary | ICD-10-CM | POA: Insufficient documentation

## 2012-02-01 DIAGNOSIS — Z87891 Personal history of nicotine dependence: Secondary | ICD-10-CM | POA: Insufficient documentation

## 2012-02-01 DIAGNOSIS — H60399 Other infective otitis externa, unspecified ear: Secondary | ICD-10-CM | POA: Insufficient documentation

## 2012-02-01 DIAGNOSIS — F319 Bipolar disorder, unspecified: Secondary | ICD-10-CM | POA: Insufficient documentation

## 2012-02-01 LAB — POCT I-STAT, CHEM 8
BUN: 11 mg/dL (ref 6–23)
Calcium, Ion: 1.18 mmol/L (ref 1.12–1.23)
Hemoglobin: 14.3 g/dL (ref 13.0–17.0)
TCO2: 23 mmol/L (ref 0–100)

## 2012-02-01 LAB — CBC WITH DIFFERENTIAL/PLATELET
Basophils Absolute: 0 K/uL (ref 0.0–0.1)
Basophils Relative: 1 % (ref 0–1)
Eosinophils Absolute: 0.1 K/uL (ref 0.0–0.7)
Eosinophils Relative: 2 % (ref 0–5)
HCT: 41.9 % (ref 39.0–52.0)
Hemoglobin: 14.1 g/dL (ref 13.0–17.0)
Lymphocytes Relative: 22 % (ref 12–46)
Lymphs Abs: 1.2 K/uL (ref 0.7–4.0)
MCH: 29.8 pg (ref 26.0–34.0)
MCHC: 33.7 g/dL (ref 30.0–36.0)
MCV: 88.6 fL (ref 78.0–100.0)
Monocytes Absolute: 1 K/uL (ref 0.1–1.0)
Monocytes Relative: 18 % — ABNORMAL HIGH (ref 3–12)
Neutro Abs: 3.2 K/uL (ref 1.7–7.7)
Neutrophils Relative %: 58 % (ref 43–77)
Platelets: 226 K/uL (ref 150–400)
RBC: 4.73 MIL/uL (ref 4.22–5.81)
RDW: 12.8 % (ref 11.5–15.5)
WBC: 5.6 K/uL (ref 4.0–10.5)

## 2012-02-01 MED ORDER — DIPHENHYDRAMINE HCL 50 MG/ML IJ SOLN
25.0000 mg | Freq: Once | INTRAMUSCULAR | Status: AC
  Administered 2012-02-01: 25 mg via INTRAVENOUS
  Filled 2012-02-01: qty 1

## 2012-02-01 MED ORDER — CIPROFLOXACIN-DEXAMETHASONE 0.3-0.1 % OT SUSP: 4.0000 [drp] | Freq: Two times a day (BID) | OTIC | Status: DC

## 2012-02-01 MED ORDER — METOCLOPRAMIDE HCL 10 MG PO TABS: 10.0000 mg | ORAL_TABLET | Freq: Four times a day (QID) | ORAL | Status: DC

## 2012-02-01 MED ORDER — DEXAMETHASONE SODIUM PHOSPHATE 4 MG/ML IJ SOLN
10.0000 mg | Freq: Once | INTRAMUSCULAR | Status: AC
  Administered 2012-02-01: 10 mg via INTRAVENOUS
  Filled 2012-02-01: qty 3

## 2012-02-01 MED ORDER — NEOMYCIN-POLYMYXIN-HC 3.5-10000-1 OT SOLN
4.0000 [drp] | Freq: Four times a day (QID) | OTIC | Status: DC
  Administered 2012-02-01: 4 [drp] via OTIC
  Filled 2012-02-01 (×2): qty 10

## 2012-02-01 MED ORDER — SODIUM CHLORIDE 0.9 % IV SOLN
INTRAVENOUS | Status: DC
  Administered 2012-02-01: 07:00:00 via INTRAVENOUS

## 2012-02-01 MED ORDER — METOCLOPRAMIDE HCL 5 MG/ML IJ SOLN
10.0000 mg | Freq: Once | INTRAMUSCULAR | Status: AC
  Administered 2012-02-01: 10 mg via INTRAVENOUS
  Filled 2012-02-01: qty 2

## 2012-02-01 MED ORDER — TRAMADOL HCL 50 MG PO TABS: 50.0000 mg | ORAL_TABLET | Freq: Four times a day (QID) | ORAL | Status: DC | PRN

## 2012-02-01 NOTE — ED Provider Notes (Signed)
Medical screening examination/treatment/procedure(s) were performed by non-physician practitioner and as supervising physician I was immediately available for consultation/collaboration.  Ruey Storer, MD 02/01/12 2255 

## 2012-02-01 NOTE — ED Notes (Signed)
Pt was painting last night and he began having a L parietal headache and nausea.  He begin feeling dizzy, like he was going to pass out, but when he sat down the dizziness subsided.  Pt took tylenol with no relief.  Emesis x 4.  Pt answers all questions appropriately, with no deficits. Pt photophobic and nauseated.

## 2012-02-01 NOTE — ED Provider Notes (Signed)
History     CSN: [id number]  Arrival date & time 02/01/12  0543   First MD Initiated Contact with Patient 02/01/12 [phone]      Chief Complaint  Patient presents with  . Headache    L parietal  . Near Syncope  . Emesis    (Consider location/radiation/quality/duration/timing/severity/associated sxs/prior treatment) HPI Comments: Patient presents with a severe headache for the past couple days. 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 He describes the headache as sharp and localized to the left side of his head. He says the pain is constant severe and associated with dizziness, nausea and 4 episodes of emesis in the past few hours. He reports taking tylenol with no relief. The only alleviating factor is laying down and keeping his head completely still. He reports having a left sided ear infection 2 weeks ago, and he was prescribed antibiotic ear drops. He denies fever, diarrhea, SOB, chest pain, abdominal pain.   Patient is a 31 y.o. male presenting with headaches and vomiting.  Headache  Associated symptoms include nausea and vomiting. Pertinent negatives include no fever and no shortness of breath.  Emesis  Associated symptoms include headaches. Pertinent negatives include no abdominal pain, no chills, no diarrhea and no fever.    Past Medical History  Diagnosis Date  . Mental disorder   . Hypertension   . Depression   . Bipolar 1 disorder   . Irregular heart rate     History reviewed. No pertinent past surgical history.  Family History  Problem Relation Age of Onset  . Hypertension Mother   . Hypertension Other     History  Substance Use Topics  . Smoking status: Former Smoker -- 0.5 packs/day  . Smokeless tobacco: Not on file  . Alcohol Use: 21.0 oz/week    35 Cans of beer per week     Reported drank alcohol only once yesterday      Review of Systems  Constitutional: Negative for fever, chills,  diaphoresis and fatigue.  HENT: Negative for ear pain, congestion, neck pain and neck stiffness.   Eyes: Positive for photophobia. Negative for visual disturbance.  Respiratory: Negative for shortness of breath.   Cardiovascular: Negative for chest pain.  Gastrointestinal: Positive for nausea and vomiting. Negative for abdominal pain and diarrhea.  Musculoskeletal: Negative for back pain.  Skin: Negative for rash and wound.  Neurological: Positive for dizziness and headaches. Negative for syncope, weakness, light-headedness and numbness.    Allergies  Review of patient's allergies indicates no known allergies.  Home Medications   Current Outpatient Rx  Name Route Sig Dispense Refill  . DULOXETINE HCL 30 MG PO CPEP Oral Take 1 capsule (30 mg total) by mouth 2 (two) times daily. For depression and pain management. 60 capsule 0  . GABAPENTIN 300 MG PO CAPS Oral Take 1 capsule (300 mg total) by mouth 4 (four) times daily -  before meals and at bedtime. For anxiety 120 capsule 0  . ADULT MULTIVITAMIN W/MINERALS CH Oral Take 1 tablet by mouth daily. For nutritional supplementation. 30 tablet 0  . QUETIAPINE FUMARATE 50 MG PO TABS  Take by mouth 1 twice a day for anxiety and 2 at bedtime for insomnia and anxiety 120 tablet 0  . THIAMINE HCL 100 MG PO TABS Oral Take 1 tablet (100 mg total) by mouth daily. For nutritional supplementation. 30 tablet 0    BP 133/78  Temp 98.3 F (36.8 C) (  Oral)  Resp 18  SpO2 96%  Physical Exam  Nursing note and vitals reviewed. Constitutional: He is oriented to person, place, and time. He appears well-developed and well-nourished.       Patient appears uncomfortable.   HENT:  Head: Normocephalic and atraumatic.  Right Ear: External ear normal.  Left Ear: External ear normal.  Mouth/Throat: No oropharyngeal exudate.       Left side ear external canal erythematous and with copious amounts of cerumen; TM not visualized. Right side ear external canal with  copious cerumen; TM visualized and intact.   Eyes: Conjunctivae are normal. No scleral icterus.       Strabismus noted.   Neck: Normal range of motion. Neck supple.  Cardiovascular: Normal rate and regular rhythm.  Exam reveals no gallop and no friction rub.   No murmur heard. Pulmonary/Chest: Effort normal. No respiratory distress. He has no wheezes. He has no rales. He exhibits no tenderness.  Abdominal: Soft. There is no tenderness.  Musculoskeletal: Normal range of motion.  Neurological: He is alert and oriented to person, place, and time. No cranial nerve deficit.       Strength and sensation intact and equal bilaterally.   Skin: Skin is warm and dry. He is not diaphoretic.  Psychiatric: He has a normal mood and affect. His behavior is normal.    ED Course  Procedures (including critical care time)  Labs Reviewed  CBC WITH DIFFERENTIAL - Abnormal; Notable for the following:    Monocytes Relative 18 (*)     All other components within normal limits  POCT I-STAT, CHEM 8   No results found.   No diagnosis found.    MDM  7:35 AM Patient with severe headache. Given migraine cocktail and ordered CT of head. Will reassess after test have resulted.   7:51 AM Patient is sleeping.  11:14 AM Patient reports feeling much better after migraine cocktail. He is no longer nauseated and can stand and walk without gait disturbance, feeling dizzy, or vomiting. I will discharge him with reglan and ultram for headache and nausea. I will also prescribe more Ciprodex drops for unresolved otitis externa. He should return with any worsening or concerning symptoms. No further evaluation needed at this time. Plan discussed with Dr. Dierdre Highman and Dr. Ranae Palms who are agreeable.    Emilia Beck, PA-C 02/01/12 [address], PA-C 02/01/12 1128

## 2012-02-12 ENCOUNTER — Emergency Department (HOSPITAL_COMMUNITY)
Admission: EM | Admit: 2012-02-12 | Discharge: 2012-02-19 | Disposition: A | Discharge status: Other Acute Inpatient Hospital | Payer: Self-pay | Attending: Emergency Medicine | Admitting: Emergency Medicine

## 2012-02-12 ENCOUNTER — Encounter (HOSPITAL_COMMUNITY): Payer: Self-pay | Admitting: Emergency Medicine

## 2012-02-12 DIAGNOSIS — R079 Chest pain, unspecified: Secondary | ICD-10-CM | POA: Insufficient documentation

## 2012-02-12 DIAGNOSIS — F419 Anxiety disorder, unspecified: Secondary | ICD-10-CM

## 2012-02-12 DIAGNOSIS — F329 Major depressive disorder, single episode, unspecified: Secondary | ICD-10-CM | POA: Insufficient documentation

## 2012-02-12 DIAGNOSIS — F411 Generalized anxiety disorder: Secondary | ICD-10-CM | POA: Insufficient documentation

## 2012-02-12 DIAGNOSIS — R45851 Suicidal ideations: Secondary | ICD-10-CM

## 2012-02-12 DIAGNOSIS — M79609 Pain in unspecified limb: Secondary | ICD-10-CM | POA: Insufficient documentation

## 2012-02-12 DIAGNOSIS — R443 Hallucinations, unspecified: Secondary | ICD-10-CM | POA: Insufficient documentation

## 2012-02-12 DIAGNOSIS — R112 Nausea with vomiting, unspecified: Secondary | ICD-10-CM | POA: Insufficient documentation

## 2012-02-12 DIAGNOSIS — F3289 Other specified depressive episodes: Secondary | ICD-10-CM | POA: Insufficient documentation

## 2012-02-12 LAB — COMPREHENSIVE METABOLIC PANEL WITH GFR
Albumin: 3.8 g/dL (ref 3.5–5.2)
Alkaline Phosphatase: 97 U/L (ref 39–117)
BUN: 11 mg/dL (ref 6–23)
Chloride: 106 meq/L (ref 96–112)
Creatinine, Ser: 0.8 mg/dL (ref 0.50–1.35)
GFR calc Af Amer: >90 mL/min (ref >90)
GFR calc non Af Amer: >90 mL/min (ref >90)
Glucose, Bld: 88 mg/dL (ref 70–99)
Total Bilirubin: 1.5 mg/dL — ABNORMAL HIGH (ref 0.3–1.2)

## 2012-02-12 LAB — CBC WITH DIFFERENTIAL/PLATELET
Basophils Absolute: 0 10*3/uL (ref 0.0–0.1)
Basophils Relative: 0 % (ref 0–1)
Eosinophils Absolute: 0.1 10*3/uL (ref 0.0–0.7)
Eosinophils Relative: 2 % (ref 0–5)
HCT: 40.7 % (ref 39.0–52.0)
Hemoglobin: 13.7 g/dL (ref 13.0–17.0)
Lymphocytes Relative: 26 % (ref 12–46)
Lymphs Abs: 1.9 K/uL (ref 0.7–4.0)
MCH: 30.1 pg (ref 26.0–34.0)
MCHC: 33.7 g/dL (ref 30.0–36.0)
MCV: 89.5 fL (ref 78.0–100.0)
Monocytes Absolute: 0.9 K/uL (ref 0.1–1.0)
Monocytes Relative: 11 % (ref 3–12)
Neutro Abs: 4.6 K/uL (ref 1.7–7.7)
Neutrophils Relative %: 61 % (ref 43–77)
Platelets: 256 10*3/uL (ref 150–400)
RBC: 4.55 MIL/uL (ref 4.22–5.81)
RDW: 13.2 % (ref 11.5–15.5)
WBC: 7.5 10*3/uL (ref 4.0–10.5)

## 2012-02-12 LAB — RAPID URINE DRUG SCREEN, HOSP PERFORMED
Amphetamines: NOT DETECTED
Barbiturates: NOT DETECTED
Benzodiazepines: NOT DETECTED
Cocaine: NOT DETECTED
Opiates: NOT DETECTED
Tetrahydrocannabinol: NOT DETECTED

## 2012-02-12 LAB — COMPREHENSIVE METABOLIC PANEL
ALT: 21 U/L (ref 0–53)
AST: 19 U/L (ref 0–37)
CO2: 27 mEq/L (ref 19–32)
Calcium: 9.5 mg/dL (ref 8.4–10.5)
Potassium: 3.2 mEq/L — ABNORMAL LOW (ref 3.5–5.1)
Sodium: 141 mEq/L (ref 135–145)
Total Protein: 6.7 g/dL (ref 6.0–8.3)

## 2012-02-12 LAB — ETHANOL: Alcohol, Ethyl (B): <11 mg/dL (ref 0–11)

## 2012-02-12 MED ORDER — DULOXETINE HCL 30 MG PO CPEP
30.0000 mg | ORAL_CAPSULE | Freq: Two times a day (BID) | ORAL | Status: DC
  Administered 2012-02-12: 20 mg via ORAL
  Administered 2012-02-12 – 2012-02-19 (×14): 30 mg via ORAL
  Filled 2012-02-12 (×17): qty 1

## 2012-02-12 MED ORDER — ALUM & MAG HYDROXIDE-SIMETH 200-200-20 MG/5ML PO SUSP: 30.0000 mL | ORAL | Status: DC | PRN

## 2012-02-12 MED ORDER — QUETIAPINE FUMARATE 50 MG PO TABS
100.0000 mg | ORAL_TABLET | Freq: Every day | ORAL | Status: DC
  Administered 2012-02-12 – 2012-02-18 (×7): 100 mg via ORAL
  Filled 2012-02-12 (×9): qty 2

## 2012-02-12 MED ORDER — GABAPENTIN 300 MG PO CAPS
300.0000 mg | ORAL_CAPSULE | Freq: Three times a day (TID) | ORAL | Status: DC
Start: 2012-02-12 — End: 2012-02-19
  Administered 2012-02-12 – 2012-02-19 (×29): 300 mg via ORAL
  Filled 2012-02-12 (×31): qty 1

## 2012-02-12 MED ORDER — ONDANSETRON 4 MG PO TBDP
8.0000 mg | ORAL_TABLET | Freq: Once | ORAL | Status: AC
  Administered 2012-02-12: 8 mg via ORAL
  Filled 2012-02-12: qty 1

## 2012-02-12 MED ORDER — POTASSIUM CHLORIDE CRYS ER 20 MEQ PO TBCR
40.0000 meq | EXTENDED_RELEASE_TABLET | Freq: Once | ORAL | Status: AC
  Administered 2012-02-12: 40 meq via ORAL
  Filled 2012-02-12: qty 2

## 2012-02-12 MED ORDER — LORAZEPAM 1 MG PO TABS
1.0000 mg | ORAL_TABLET | Freq: Three times a day (TID) | ORAL | Status: DC | PRN
  Administered 2012-02-13 – 2012-02-14 (×2): 1 mg via ORAL
  Filled 2012-02-12 (×2): qty 1

## 2012-02-12 MED ORDER — LORAZEPAM 1 MG PO TABS
1.0000 mg | ORAL_TABLET | Freq: Once | ORAL | Status: AC
  Administered 2012-02-12: 1 mg via ORAL
  Filled 2012-02-12: qty 1

## 2012-02-12 MED ORDER — GABAPENTIN 600 MG PO TABS
300.0000 mg | ORAL_TABLET | Freq: Four times a day (QID) | ORAL | Status: DC
  Filled 2012-02-12 (×3): qty 0.5

## 2012-02-12 MED ORDER — ACETAMINOPHEN 325 MG PO TABS
650.0000 mg | ORAL_TABLET | ORAL | Status: DC | PRN
  Administered 2012-02-18: 650 mg via ORAL
  Filled 2012-02-12: qty 2

## 2012-02-12 MED ORDER — ONDANSETRON HCL 8 MG PO TABS: 4.0000 mg | ORAL_TABLET | Freq: Three times a day (TID) | ORAL | Status: DC | PRN

## 2012-02-12 NOTE — BH Assessment (Signed)
Assessment Note   [name] [name] is an 31 y.o. male that presents to the ED with increasing depression and suicidal ideation, with thoughts of cutting his throat.  Pt has numerous stressors, voices minimal support, and is now facing homelessness "I was staying with my sister but it wasn't working out.  Now I just stay here and there."  Pt reports beginning Neurontin, Seroquel, Cymbalta, and Gabapentin prescribed during his last admission, but though voicing that he is compliant, states "it is not working."  Pt also states that he is having financial difficulty in obtaining medications and seeing a Engineer, miningI filed for [employer], but haven't heard back."  Pt also admits to drinking "a few beers this past weekend because I was so stressed out."  Pt admits "feeling just as bad as I did last time when I had to get admitted."  Pt is not able to contract for safety and is requesting to return to the hospital.    Axis I: Bipolar, Depressed Axis II: Deferred Axis III:  Past Medical History  Diagnosis Date  . Mental disorder   . Hypertension   . Depression   . Bipolar 1 disorder   . Irregular heart rate    Axis IV: housing problems, other psychosocial or environmental problems, problems related to social environment, problems with access to health care services and problems with primary support group Axis V: 21-30 behavior considerably influenced by delusions or hallucinations OR serious impairment in judgment, communication OR inability to function in almost all areas  Past Medical History:  Past Medical History  Diagnosis Date  . Mental disorder   . Hypertension   . Depression   . Bipolar 1 disorder   . Irregular heart rate     History reviewed. No pertinent past surgical history.  Family History:  Family History  Problem Relation Age of Onset  . Hypertension Mother   . Hypertension Other     Social History:  reports that he has quit smoking. He does not have any smokeless  tobacco history on file. He reports that he drinks about 21 ounces of alcohol per week. He reports that he does not use illicit drugs.  Additional Social History:  Alcohol / Drug Use Pain Medications: No; see MAR Prescriptions: Yes; see MAR Over the Counter: No; see MAR Substance #2 Name of Substance 2: ETOH 2 - Age of First Use: teens 2 - Amount (size/oz): currently "just a few beers" 2 - Frequency: this past weekend 2 - Duration: weeks 2 - Last Use / Amount: Saturday night per report  CIWA: CIWA-Ar BP: 111/59 mmHg Pulse Rate: 89  Nausea and Vomiting: no nausea and no vomiting Tactile Disturbances: none Tremor: no tremor Auditory Disturbances: mild harshness or ability to frighten Paroxysmal Sweats: no sweat visible Visual Disturbances: not present Anxiety: mildly anxious Headache, Fullness in Head: none present Agitation: normal activity Orientation and Clouding of Sensorium: oriented and can do serial additions CIWA-Ar Total: 3  COWS:    Allergies: No Known Allergies  Home Medications:  (Not in a hospital admission)  OB/GYN Status:  No LMP for male patient.  General Assessment Data Location of Assessment: [facility] ED ACT Assessment: Yes Living Arrangements: Other relatives Can pt return to current living arrangement?: Yes Admission Status: Voluntary Is patient capable of signing voluntary admission?: Yes Transfer from: Acute Hospital Referral Source: Self/Family/Friend  Education Status Is patient currently in school?: No  Risk to self Suicidal Ideation: Yes-Currently Present Suicidal Intent: No Is patient  at risk for suicide?: Yes Suicidal Plan?: Yes-Currently Present Specify Current Suicidal Plan: To cut his throat with a knife Access to Means: Yes Specify Access to Suicidal Means: Sharps available What has been your use of drugs/alcohol within the last 12 months?: Hx of ETOH abuse Previous Attempts/Gestures: Yes How many times?: 2  Other Self Harm Risks:  none Triggers for Past Attempts: Unpredictable;Anniversary;Other personal contacts Intentional Self Injurious Behavior: Damaging Family Suicide History: No Recent stressful life event(s): Conflict (Comment);Loss (Comment);Turmoil (Comment);Recent negative physical changes Persecutory voices/beliefs?: No Depression: Yes Depression Symptoms: Loss of interest in usual pleasures;Fatigue;Feeling worthless/self pity Substance abuse history and/or treatment for substance abuse?: Yes Suicide prevention information given to non-admitted patients: Not applicable  Risk to Others Homicidal Ideation: No Thoughts of Harm to Others: No Current Homicidal Intent: No Current Homicidal Plan: No Access to Homicidal Means: No Identified Victim: none per pt History of harm to others?: No Assessment of Violence: None Noted Violent Behavior Description: resting quietly; cooperative Does patient have access to weapons?: No Criminal Charges Pending?: No Does patient have a court date: No  Psychosis Hallucinations: Auditory Delusions: None noted  Mental Status Report Appear/Hygiene:  (Casual in scrubs) Eye Contact: Good Motor Activity: Unremarkable Speech: Logical/coherent Level of Consciousness: Quiet/awake Mood: Depressed;Apathetic;Helpless;Worthless, low self-esteem Affect: Appropriate to circumstance;Depressed;Sad Anxiety Level: Minimal Thought Processes: Relevant Judgement: Impaired Orientation: Person;Place;Time;Situation Obsessive Compulsive Thoughts/Behaviors: Moderate  Cognitive Functioning Concentration: Decreased Memory: Recent Intact;Remote Intact IQ: Average Insight: Poor Impulse Control: Poor Appetite: Fair Weight Loss: 0  Weight Gain: 0  Sleep: Decreased Total Hours of Sleep:  (less than five hours/nite) Vegetative Symptoms: None  ADLScreening [facility] Assessment Services) Patient's cognitive ability adequate to safely complete daily activities?: Yes Patient able to express  need for assistance with ADLs?: Yes Independently performs ADLs?: Yes (appropriate for developmental age)  Abuse/Neglect [facility]) Physical Abuse: Denies (Friends and spouse) Verbal Abuse: Denies Sexual Abuse: Denies  Prior Inpatient Therapy Prior Inpatient Therapy: Yes Prior Therapy Dates: 2012 Prior Therapy Facilty/Provider(s): [facility]; [facility] Reason for Treatment: STABILIZATION  Prior Outpatient Therapy Prior Outpatient Therapy: Yes Prior Therapy Dates: CURRENTLY Prior Therapy Facilty/Provider(s): [facility] Reason for Treatment: MED MANAGEMENT  ADL Screening (condition at time of admission) Patient's cognitive ability adequate to safely complete daily activities?: Yes Patient able to express need for assistance with ADLs?: Yes Independently performs ADLs?: Yes (appropriate for developmental age)       Abuse/Neglect Assessment (Assessment to be complete while patient is alone) Physical Abuse: Denies (Friends and spouse) Verbal Abuse: Denies Sexual Abuse: Denies Values / Beliefs Cultural Requests During Hospitalization: None Spiritual Requests During Hospitalization: None     Nutrition Screen- [facility] Adult/[facility]/AP Patient's home diet: Regular (peanut butter, graham crackers & ginger ale)  Additional Information 1:1 In Past 12 Months?: Yes CIRT Risk: No Elopement Risk: No Does patient have medical clearance?: Yes     Disposition: Referred to [facility] for inpatient care. Disposition Disposition of Patient: Inpatient treatment program;Referred to Type of inpatient treatment program: Adult  On Site Evaluation by:   Reviewed with Physician:     Angelica Ran 02/12/2012 10:16 AM

## 2012-02-12 NOTE — ED Notes (Signed)
Assessment at bedside 

## 2012-02-12 NOTE — ED Provider Notes (Addendum)
History     CSN: [id number]  Arrival date & time 02/12/12  0231   First MD Initiated Contact with Patient 02/12/12 [phone]      Chief Complaint  Patient presents with  . Chest Pain    (Consider location/radiation/quality/duration/timing/severity/associated sxs/prior treatment) HPI Comments: Pt reports worsening SI, meds aren't helping.  Has friend support, but not enough. He thought about cutting throat with a knife, stopped by his friend a couple of days ago.  His mother passed 10 months ago, he has had trouble dealing with it.  He is here voluntarily.  He has had intermittent N/V, no nausea now, no fevers, no cough or cold, has had mild chest tightness, worse with deep breath, feels heavy, attributes to stress. Has felt this in the past, no h/o MI or CAD.  Denies sweats, SOB.  Patient is a 31 y.o. male presenting with chest pain. The history is provided by the patient.  Chest Pain Primary symptoms include fatigue, nausea and vomiting. Pertinent negatives for primary symptoms include no shortness of breath.     Past Medical History  Diagnosis Date  . Mental disorder   . Hypertension   . Depression   . Bipolar 1 disorder   . Irregular heart rate     History reviewed. No pertinent past surgical history.  Family History  Problem Relation Age of Onset  . Hypertension Mother   . Hypertension Other     History  Substance Use Topics  . Smoking status: Former Smoker -- 0.5 packs/day  . Smokeless tobacco: Not on file  . Alcohol Use: 21.0 oz/week    35 Cans of beer per week     Reported drank alcohol only once yesterday      Review of Systems  Constitutional: Positive for appetite change and fatigue.  Respiratory: Negative for shortness of breath.   Cardiovascular: Positive for chest pain.  Gastrointestinal: Positive for nausea and vomiting.  Neurological: Negative for headaches.  Psychiatric/Behavioral: Positive for hallucinations, dysphoric mood and decreased  concentration. Negative for agitation. The patient is nervous/anxious.   All other systems reviewed and are negative.    Allergies  Review of patient's allergies indicates no known allergies.  Home Medications   Current Outpatient Rx  Name Route Sig Dispense Refill  . DULOXETINE HCL 30 MG PO CPEP Oral Take 1 capsule (30 mg total) by mouth 2 (two) times daily. For depression and pain management. 60 capsule 0  . GABAPENTIN 300 MG PO CAPS Oral Take 1 capsule (300 mg total) by mouth 4 (four) times daily -  before meals and at bedtime. For anxiety 120 capsule 0  . ADULT MULTIVITAMIN W/MINERALS CH Oral Take 1 tablet by mouth daily. For nutritional supplementation. 30 tablet 0  . QUETIAPINE FUMARATE 50 MG PO TABS Oral Take 50-100 mg by mouth 2 (two) times daily. 50 mg during the day, then 100 mg at bedtime    . THIAMINE HCL 100 MG PO TABS Oral Take 1 tablet (100 mg total) by mouth daily. For nutritional supplementation. 30 tablet 0  . TRAMADOL HCL 50 MG PO TABS Oral Take 50 mg by mouth every 6 (six) hours as needed. For pain      BP 113/70  Pulse 66  Temp 98 F (36.7 C)  Resp 19  SpO2 98%  Physical Exam  Nursing note and vitals reviewed. Constitutional: He is oriented to person, place, and time. He appears well-developed and well-nourished.  HENT:  Head: Normocephalic and atraumatic.  Neck:  Normal range of motion. Neck supple.  Cardiovascular: Normal rate and regular rhythm.   No murmur heard. Pulmonary/Chest: Effort normal. No respiratory distress. He has no wheezes.  Abdominal: Soft. He exhibits no distension. There is no tenderness.  Neurological: He is alert and oriented to person, place, and time. No cranial nerve deficit. Coordination normal.  Skin: Skin is warm.  Psychiatric: His speech is normal. His affect is not angry and not inappropriate. He is withdrawn and actively hallucinating. He is not aggressive. Thought content is not delusional. He expresses impulsivity. He  exhibits a depressed mood. He expresses suicidal ideation. He expresses suicidal plans. He exhibits normal recent memory.    ED Course  Procedures (including critical care time)  Labs Reviewed  COMPREHENSIVE METABOLIC PANEL - Abnormal; Notable for the following:    Potassium 3.2 (*)     Total Bilirubin 1.5 (*)     All other components within normal limits  CBC WITH DIFFERENTIAL  URINE RAPID DRUG SCREEN (HOSP PERFORMED)  ETHANOL   No results found.   1. Suicidal ideation   2. Anxiety      RA sat is 98% and I interpret to be normal.   ECG at time 0306 shows SR, LAD, normal intervals, no ST or T wave abn's.  K+ is low at 3.2, may be contributing to LAD.  Will replace.  7:08 AM Discussed t with Dr. Ignacia Palma   MDM  Pt's CP is I think due to anxiety, stress.  ECG shows no acute ischemia.  Pt is thirsty now, wants to eat, I doubt N/V is sig, likely again due to stress.  Pt voluntarily wishes inpt, doesn't feel safe at home.  Will consult ACT.          Gavin Pound. Oletta Lamas, MD 02/12/12 1610  Gavin Pound. Oletta Lamas, MD 02/12/12 [id number]

## 2012-02-12 NOTE — ED Notes (Signed)
Polite, alert, NAD, calm, interactive, skin W&D, resps e/u, speaking in clear complete sentences, flat affect, (denies: pain, sob, nausea, dizziness, physical sx or complaints), given snack & soda per request, sitter at [facility]

## 2012-02-12 NOTE — ED Notes (Signed)
Placed on cardiac monitor in SR rate 82,  Denies CP at tthis time

## 2012-02-12 NOTE — ED Notes (Signed)
PER EMS- Pt has chest pain. Pain is not reproducable. Only feels pain during deep inspiration. Has SI, 4 seroquels on Saturdays. Pt states he also threaten to cut his throw but his friends knocked the knife out of his hands. Pt reports hearing mother voice tonight, mother is deceased. Alertx 4. BP 140/80, 60 bpm, 16 respiration. Seen recently for cold like symptoms. Reports vomiting after taking Seroquel.

## 2012-02-12 NOTE — ED Notes (Signed)
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

## 2012-02-12 NOTE — Progress Notes (Signed)
7:47 AM Asleep when I rounded.  Presented last night with suicidal ideation, here voluntarily.  Awaiting ACT team disposition.

## 2012-02-12 NOTE — ED Notes (Signed)
Patient states he has been drinking the last few days, including tonight.

## 2012-02-13 ENCOUNTER — Encounter (HOSPITAL_COMMUNITY): Payer: Self-pay | Admitting: *Deleted

## 2012-02-13 NOTE — ED Notes (Signed)
Relieved sitter for bathroom break 

## 2012-02-13 NOTE — ED Notes (Signed)
Pt resting on stretcher, sitter at bedside

## 2012-02-13 NOTE — ED Notes (Signed)
Sitter has returned 

## 2012-02-13 NOTE — ED Notes (Signed)
Lunch Delivered 

## 2012-02-13 NOTE — ED Notes (Signed)
Breakfast tray has arrived.

## 2012-02-13 NOTE — ED Notes (Signed)
Resting quietly on stretcher with eyes closed, resp even, nonlabored

## 2012-02-13 NOTE — BH Assessment (Addendum)
Assessment Note   [name] [name] is an 31 y.o. male that was reassessed this day.  Pt continues to endorse SI with plan to cut his throat.  Pt also continues to endorse auditory hallucinations, stating he hears his mother calling him.  Pt stated he also dreamt about his mother and this made him sad.  Pt reports, "I just don't feel good at all."  Pt denies HI.  Pt admitted to drinking "a few beers" this past weekend.  Pt declined [facility] due to has met his maximum bebefit there per [facility] by Aggie Nwoko.  Completed reassessment, assessment notification and faxed to [facility] to log.  Called [place] and per [place], beds available @ [phone].  Called [place] and per [place] @ 1500, may have discharge and was told to fax referral.  Pt also under review at [facility].  Referrals faxed for review.  Updated ED staff.   Previous Note:  [name] [name] is an 31 y.o. male that presents to the ED with increasing depression and suicidal ideation, with thoughts of cutting his throat. Pt has numerous stressors, voices minimal support, and is now facing homelessness "I was staying with my sister but it wasn't working out. Now I just stay here and there." Pt reports beginning Neurontin, Seroquel, Cymbalta, and Gabapentin prescribed during his last admission, but though voicing that he is compliant, states "it is not working." Pt also states that he is having financial difficulty in obtaining medications and seeing a Armed forces logistics/support/administrative officerI filed for [employer], but haven't heard back." Pt also admits to drinking "a few beers this past weekend because I was so stressed out." Pt admits "feeling just as bad as I did last time when I had to get admitted." Pt is not able to contract for safety and is requesting to return to the hospital.    Axis I: Bipolar, Depressed Axis II: Deferred Axis III:  Past Medical History  Diagnosis Date  . Mental disorder   . Hypertension   . Depression   . Bipolar 1 disorder   . Irregular heart rate     Axis IV: economic problems, housing problems, other psychosocial or environmental problems, problems related to social environment, problems with access to health care services and problems with primary support group Axis V: 21-30 behavior considerably influenced by delusions or hallucinations OR serious impairment in judgment, communication OR inability to function in almost all areas  Past Medical History:  Past Medical History  Diagnosis Date  . Mental disorder   . Hypertension   . Depression   . Bipolar 1 disorder   . Irregular heart rate     History reviewed. No pertinent past surgical history.  Family History:  Family History  Problem Relation Age of Onset  . Hypertension Mother   . Hypertension Other     Social History:  reports that he has quit smoking. He does not have any smokeless tobacco history on file. He reports that he drinks about 21 ounces of alcohol per week. He reports that he does not use illicit drugs.  Additional Social History:  Alcohol / Drug Use Pain Medications: No; see MAR Prescriptions: Yes; see MAR Over the Counter: No; see MAR Longest period of sobriety (when/how long): unknown Negative Consequences of Use: Personal relationships Withdrawal Symptoms:  (pt denies) Substance #2 Name of Substance 2: ETOH 2 - Age of First Use: teens 2 - Amount (size/oz): currently "just a few beers" 2 - Frequency: this past weekend 2 - Duration: weeks  2 - Last Use / Amount: Saturday night per report  CIWA: CIWA-Ar BP: 113/73 mmHg Pulse Rate: 72  Nausea and Vomiting: no nausea and no vomiting Tactile Disturbances: none Tremor: no tremor Auditory Disturbances: mild harshness or ability to frighten Paroxysmal Sweats: no sweat visible Visual Disturbances: not present Anxiety: mildly anxious Headache, Fullness in Head: none present Agitation: normal activity Orientation and Clouding of Sensorium: oriented and can do serial additions CIWA-Ar Total: 3   COWS:    Allergies: No Known Allergies  Home Medications:  (Not in a hospital admission)  OB/GYN Status:  No LMP for male patient.  General Assessment Data Location of Assessment: [facility] ED ACT Assessment: Yes Living Arrangements: Other relatives Can pt return to current living arrangement?: Yes Admission Status: Voluntary Is patient capable of signing voluntary admission?: Yes Transfer from: Acute Hospital Referral Source: Self/Family/Friend  Education Status Is patient currently in school?: No  Risk to self Suicidal Ideation: Yes-Currently Present Suicidal Intent: No Is patient at risk for suicide?: Yes Suicidal Plan?: Yes-Currently Present Specify Current Suicidal Plan: To cut his throat with a knife Access to Means: Yes Specify Access to Suicidal Means: Access to sharps outside of ED What has been your use of drugs/alcohol within the last 12 months?: Hx ETOH abuse Previous Attempts/Gestures: Yes How many times?: 2  Other Self Harm Risks: pt denies Triggers for Past Attempts: Unpredictable;Anniversary;Other personal contacts Intentional Self Injurious Behavior: Damaging Family Suicide History: No Recent stressful life event(s): Conflict (Comment);Loss (Comment);Turmoil (Comment) Persecutory voices/beliefs?: No Depression: Yes Depression Symptoms: Despondent;Fatigue;Loss of interest in usual pleasures;Feeling worthless/self pity Substance abuse history and/or treatment for substance abuse?: Yes Suicide prevention information given to non-admitted patients: Not applicable  Risk to Others Homicidal Ideation: No Thoughts of Harm to Others: No Current Homicidal Intent: No Current Homicidal Plan: No Access to Homicidal Means: No Identified Victim: pt denies History of harm to others?: No Assessment of Violence: None Noted Violent Behavior Description: na - pt calm, cooperative Does patient have access to weapons?: No Criminal Charges Pending?: No Does patient have a  court date: No  Psychosis Hallucinations: Auditory Delusions: None noted  Mental Status Report Appear/Hygiene: Disheveled Eye Contact: Good Motor Activity: Unremarkable Speech: Logical/coherent Level of Consciousness: Quiet/awake Mood: Depressed Affect: Appropriate to circumstance Anxiety Level: Minimal Thought Processes: Coherent;Relevant Judgement: Unimpaired Orientation: Person;Place;Time;Situation Obsessive Compulsive Thoughts/Behaviors: None  Cognitive Functioning Concentration: Decreased Memory: Recent Intact;Remote Intact IQ: Average Insight: Poor Impulse Control: Poor Appetite: Fair Weight Loss: 0  Weight Gain: 0  Sleep: Decreased Total Hours of Sleep:  (< 5 hrs per night) Vegetative Symptoms: None  ADLScreening [facility] Assessment Services) Patient's cognitive ability adequate to safely complete daily activities?: Yes Patient able to express need for assistance with ADLs?: Yes Independently performs ADLs?: Yes (appropriate for developmental age)  Abuse/Neglect [facility]) Physical Abuse: Denies Verbal Abuse: Denies Sexual Abuse: Denies  Prior Inpatient Therapy Prior Inpatient Therapy: Yes Prior Therapy Dates: 2012 Prior Therapy [facility]ilty[facility]Provider([facility] Reason for Treatment: STABILIZATION  Prior Outpatient Therapy Prior Outpatient Therapy: Yes Prior Therapy Dates: CURRENTLY Prior Therapy Facilty/Provider(s): [facility] Reason for Treatment: MED MANAGEMENT  ADL Screening (condition at time of admission) Patient's cognitive ability adequate to safely complete daily activities?: Yes Patient able to express need for assistance with ADLs?: Yes Independently performs ADLs?: Yes (appropriate for developmental age) Weakness of Legs: None Weakness of Arms/Hands: None  Home Assistive Devices/Equipment Home Assistive Devices/Equipment: None    Abuse/Neglect Assessment (Assessment to be complete while patient is alone) Physical  Abuse: Denies Verbal  Abuse: Denies Sexual Abuse: Denies Exploitation of patient/patient's resources: Denies Self-Neglect: Denies Values / Beliefs Cultural Requests During Hospitalization: None Spiritual Requests During Hospitalization: None Consults Spiritual Care Consult Needed: No Social Work Consult Needed: No Merchant navy officer (For Healthcare) Advance Directive: Patient does not have advance directive;Patient would not like information Nutrition Screen- [facility] Adult/WL/AP Patient's home diet: Regular (peanut butter, graham crackers & ginger ale)  Additional Information 1:1 In Past 12 Months?: Yes CIRT Risk: No Elopement Risk: No Does patient have medical clearance?: Yes     Disposition:  Disposition Disposition of Patient: Referred to;Inpatient treatment program Type of inpatient treatment program: Adult Patient referred to: Other (Comment) (Pending [facility] [facility])  On Site Evaluation by:   Reviewed with Physician:  Caryl Asp, Rennis Harding 02/13/2012 3:10 PM

## 2012-02-13 NOTE — ED Notes (Signed)
Relieving sitter for a break 

## 2012-02-14 NOTE — BH Assessment (Signed)
Assessment Note   [name] [name] is an 31 y.o. male.  This clinician talked to patient about his suicidality.  He continues to say that he feels hopeless and that "I don't care anymore, I'm tired of living."  Patient still endorses thoughts of cutting his throat.  He talked about sometimes hearing his mother's voice and seeing her in his dreams.  She did pass away about 10 months ago.  Patient denies any HI at this time.  He admits to drinking during the past weekend but is not in any need of detox services.  He is unsure of his future housing and has recently lost his job.  He currently is unable to contract for safety and requires inpatient care.   Previous Note: [name] [name] is an 31 y.o. male that was reassessed this day. Pt continues to endorse SI with plan to cut his throat. Pt also continues to endorse auditory hallucinations, stating he hears his mother calling him. Pt stated he also dreamt about his mother and this made him sad. Pt reports, "I just don't feel good at all." Pt denies HI. Pt admitted to drinking "a few beers" this past weekend. Pt declined [facility] due to has met his maximum benefit there per [facility] by Aggie Nwoko. Completed reassessment, assessment notification and faxed to [facility] to log. Called [place] and per [place], beds available @ [phone]. Called [place] and per [place] @ 1500, may have discharge and was told to fax referral. Pt also under review at [facility]. Referrals faxed for review. Updated ED staff.  Previous Note:  [name] [name] is an 31 y.o. male that presents to the ED with increasing depression and suicidal ideation, with thoughts of cutting his throat. Pt has numerous stressors, voices minimal support, and is now facing homelessness "I was staying with my sister but it wasn't working out. Now I just stay here and there." Pt reports beginning Neurontin, Seroquel, Cymbalta, and Gabapentin prescribed during his last admission, but though voicing that he is compliant,  states "it is not working." Pt also states that he is having financial difficulty in obtaining medications and seeing a Armed forces logistics/support/administrative officerI filed for [employer], but haven't heard back." Pt also admits to drinking "a few beers this past weekend because I was so stressed out." Pt admits "feeling just as bad as I did last time when I had to get admitted." Pt is not able to contract for safety and is requesting to return to the hospital.   Axis I: Bipolar, Depressed Axis II: Deferred Axis III:  Past Medical History  Diagnosis Date  . Mental disorder   . Hypertension   . Depression   . Bipolar 1 disorder   . Irregular heart rate    Axis IV: economic problems, housing problems, occupational problems, other psychosocial or environmental problems and problems with primary support group Axis V: 31-40 impairment in reality testing  Past Medical History:  Past Medical History  Diagnosis Date  . Mental disorder   . Hypertension   . Depression   . Bipolar 1 disorder   . Irregular heart rate     History reviewed. No pertinent past surgical history.  Family History:  Family History  Problem Relation Age of Onset  . Hypertension Mother   . Hypertension Other     Social History:  reports that he has quit smoking. He does not have any smokeless tobacco history on file. He reports that he drinks about 21 ounces of alcohol per week.  He reports that he does not use illicit drugs.  Additional Social History:  Alcohol / Drug Use Pain Medications: No; see MAR Prescriptions: Yes; see MAR Over the Counter: No; see MAR Longest period of sobriety (when/how long): unknown Negative Consequences of Use: Personal relationships Withdrawal Symptoms:  (pt denies) Substance #2 Name of Substance 2: ETOH 2 - Age of First Use: teens 2 - Amount (size/oz): currently "just a few beers" 2 - Frequency: this past weekend 2 - Duration: weeks 2 - Last Use / Amount: Saturday night per report  CIWA: CIWA-Ar BP:  116/72 mmHg Pulse Rate: 78  Nausea and Vomiting: no nausea and no vomiting Tactile Disturbances: none Tremor: no tremor Auditory Disturbances: mild harshness or ability to frighten Paroxysmal Sweats: no sweat visible Visual Disturbances: not present Anxiety: mildly anxious Headache, Fullness in Head: none present Agitation: normal activity Orientation and Clouding of Sensorium: oriented and can do serial additions CIWA-Ar Total: 3  COWS:    Allergies: No Known Allergies  Home Medications:  (Not in a hospital admission)  OB/GYN Status:  No LMP for male patient.  General Assessment Data Location of Assessment: [facility] ED ACT Assessment: Yes Living Arrangements: Other relatives Can pt return to current living arrangement?: Yes Admission Status: Voluntary Is patient capable of signing voluntary admission?: Yes Transfer from: Acute Hospital Referral Source: Self/Family/Friend  Education Status Is patient currently in school?: No  Risk to self Suicidal Ideation: Yes-Currently Present Suicidal Intent: No Is patient at risk for suicide?: Yes Suicidal Plan?: Yes-Currently Present Specify Current Suicidal Plan: To cut throat with knife. Access to Means: Yes Specify Access to Suicidal Means: Sharps What has been your use of drugs/alcohol within the last 12 months?: Hx of ETOH abuse Previous Attempts/Gestures: Yes How many times?: 2  Other Self Harm Risks: Pt denies Triggers for Past Attempts: Unpredictable;Anniversary;Other personal contacts Intentional Self Injurious Behavior: Damaging Family Suicide History: No Recent stressful life event(s): Job Loss;Financial Problems;Conflict (Comment) (Conflict at job resulted in job loss) Persecutory voices/beliefs?: Yes Depression: Yes Depression Symptoms: Despondent;Feeling worthless/self pity;Loss of interest in usual pleasures;Fatigue;Isolating Substance abuse history and/or treatment for substance abuse?: Yes Suicide prevention  information given to non-admitted patients: Not applicable  Risk to Others Homicidal Ideation: No Thoughts of Harm to Others: No Current Homicidal Intent: No Current Homicidal Plan: No Access to Homicidal Means: No Identified Victim: No one History of harm to others?: No Assessment of Violence: None Noted Violent Behavior Description: Pt calm and cooperative Does patient have access to weapons?: No Criminal Charges Pending?: No Does patient have a court date: No  Psychosis Hallucinations: Auditory (Sometimes hears deceased mother's voice) Delusions: None noted  Mental Status Report Appear/Hygiene: Disheveled Eye Contact: Fair Motor Activity: Freedom of movement;Unremarkable Speech: Logical/coherent Level of Consciousness: Quiet/awake Mood: Depressed;Empty;Despair;Sad;Worthless, low self-esteem Affect: Blunted;Depressed;Sad Anxiety Level: Minimal Thought Processes: Coherent;Relevant Judgement: Unimpaired Orientation: Person;Place;Time;Situation Obsessive Compulsive Thoughts/Behaviors: None  Cognitive Functioning Concentration: Decreased Memory: Recent Intact;Remote Intact IQ: Average Insight: Poor Impulse Control: Poor Appetite: Fair Weight Loss: 0  Weight Gain: 0  Sleep: Decreased Total Hours of Sleep:  (< 5 H/D) Vegetative Symptoms: Staying in bed  ADLScreening [facility] Assessment Services) Patient's cognitive ability adequate to safely complete daily activities?: Yes Patient able to express need for assistance with ADLs?: Yes Independently performs ADLs?: Yes (appropriate for developmental age)  Abuse/Neglect [facility]) Physical Abuse: Denies Verbal Abuse: Denies Sexual Abuse: Denies  Prior Inpatient Therapy Prior Inpatient Therapy: Yes Prior Therapy Dates: 2012 Prior [facility]ilty[facility]Provider[facility]  Reason for Treatment: STABILIZATION  Prior Outpatient Therapy Prior Outpatient Therapy: Yes Prior Therapy Dates: CURRENTLY Prior Therapy  Facilty/Provider(s): [facility] Reason for Treatment: MED MANAGEMENT  ADL Screening (condition at time of admission) Patient's cognitive ability adequate to safely complete daily activities?: Yes Patient able to express need for assistance with ADLs?: Yes Independently performs ADLs?: Yes (appropriate for developmental age) Weakness of Legs: None Weakness of Arms/Hands: None  Home Assistive Devices/Equipment Home Assistive Devices/Equipment: None    Abuse/Neglect Assessment (Assessment to be complete while patient is alone) Physical Abuse: Denies Verbal Abuse: Denies Sexual Abuse: Denies Exploitation of patient/patient's resources: Denies Self-Neglect: Denies Values / Beliefs Cultural Requests During Hospitalization: None Spiritual Requests During Hospitalization: None Consults Spiritual Care Consult Needed: No Social Work Consult Needed: No Merchant navy officer (For Healthcare) Advance Directive: Patient does not have advance directive;Patient would not like information Nutrition Screen- [facility] Adult/WL/AP Patient's home diet: Regular (peanut butter, graham crackers & ginger ale)  Additional Information 1:1 In Past 12 Months?: Yes CIRT Risk: No Elopement Risk: No Does patient have medical clearance?: Yes     Disposition:  Disposition Disposition of Patient: Referred to;Inpatient treatment program Type of inpatient treatment program: Adult Patient referred to: Other (Comment) (Pending [facility])  On Site Evaluation by:   Reviewed with Physician:     [place]and[place] Lodge 02/14/2012 8:44 PM

## 2012-02-15 NOTE — BH Assessment (Signed)
Assessment Note   [name] [name] is an 31 y.o. male that was reassessed this day.  Pt continues to endorse SI with plan as well as auditory hallucinations.  Pt stated, "I had a bad dream last night and I heard my mother's voice.  I am trying to get better."  Pt denies HI.  Pt unable to contract for safety.  Called [facility] and per [place] @ [phone], pt is still on their wait list.  Oncoming staff will need to follow up with referral.  Completed reassessment, assessment notification and faxed to [facility] to log.  Updated ED staff.  Previous Note:  [name] [name] is an 32 y.o. male.  This clinician talked to patient about his suicidality. He continues to say that he feels hopeless and that "I don't care anymore, I'm tired of living." Patient still endorses thoughts of cutting his throat. He talked about sometimes hearing his mother's voice and seeing her in his dreams. She did pass away about 10 months ago. Patient denies any HI at this time. He admits to drinking during the past weekend but is not in any need of detox services. He is unsure of his future housing and has recently lost his job. He currently is unable to contract for safety and requires inpatient care.    Axis I: Bipolar, Depressed Axis II: Deferred Axis III:  Past Medical History  Diagnosis Date  . Mental disorder   . Hypertension   . Depression   . Bipolar 1 disorder   . Irregular heart rate    Axis IV: economic problems, housing problems, other psychosocial or environmental problems and problems with primary support group Axis V: 21-30 behavior considerably influenced by delusions or hallucinations OR serious impairment in judgment, communication OR inability to function in almost all areas  Past Medical History:  Past Medical History  Diagnosis Date  . Mental disorder   . Hypertension   . Depression   . Bipolar 1 disorder   . Irregular heart rate     History reviewed. No pertinent past surgical history.  Family  History:  Family History  Problem Relation Age of Onset  . Hypertension Mother   . Hypertension Other     Social History:  reports that he has quit smoking. He does not have any smokeless tobacco history on file. He reports that he drinks about 21 ounces of alcohol per week. He reports that he does not use illicit drugs.  Additional Social History:  Alcohol / Drug Use Pain Medications: No; see MAR Prescriptions: Yes; see MAR Over the Counter: No; see MAR Longest period of sobriety (when/how long): unknown Negative Consequences of Use: Personal relationships Withdrawal Symptoms:  (pt denies) Substance #2 Name of Substance 2: ETOH 2 - Age of First Use: teens 2 - Amount (size/oz): currently "just a few beers" 2 - Frequency: this past weekend 2 - Duration: weeks 2 - Last Use / Amount: Saturday night per report  CIWA: CIWA-Ar BP: 110/70 mmHg Pulse Rate: 87  Nausea and Vomiting: no nausea and no vomiting Tactile Disturbances: none Tremor: no tremor Auditory Disturbances: mild harshness or ability to frighten Paroxysmal Sweats: no sweat visible Visual Disturbances: not present Anxiety: mildly anxious Headache, Fullness in Head: none present Agitation: normal activity Orientation and Clouding of Sensorium: oriented and can do serial additions CIWA-Ar Total: 3  COWS:    Allergies: No Known Allergies  Home Medications:  (Not in a hospital admission)  OB/GYN Status:  No LMP for male  patient.  General Assessment Data Location of Assessment: [facility] ED ACT Assessment: Yes Living Arrangements: Other relatives Can pt return to current living arrangement?: Yes Admission Status: Voluntary Is patient capable of signing voluntary admission?: Yes Transfer from: Acute Hospital Referral Source: Self/Family/Friend  Education Status Is patient currently in school?: No  Risk to self Suicidal Ideation: Yes-Currently Present Suicidal Intent: No Is patient at risk for suicide?:  Yes Suicidal Plan?: Yes-Currently Present Specify Current Suicidal Plan: To cut throat with a knife Access to Means: Yes Specify Access to Suicidal Means: [facility] outside of ED What has been your use of drugs/alcohol within the last 12 months?: Hx of ETOH abuse Previous Attempts/Gestures: Yes How many times?: 2  Other Self Harm Risks: Pt denies Triggers for Past Attempts: Unpredictable;Anniversary;Other personal contacts Intentional Self Injurious Behavior: Damaging Family Suicide History: No Recent stressful life event(s): Job Loss;Financial Problems;Conflict (Comment) Persecutory voices/beliefs?: Yes Depression: Yes Depression Symptoms: Despondent;Feeling worthless/self pity;Loss of interest in usual pleasures;Fatigue;Isolating Substance abuse history and/or treatment for substance abuse?: No Suicide prevention information given to non-admitted patients: Not applicable  Risk to Others Homicidal Ideation: No Thoughts of Harm to Others: No Current Homicidal Intent: No Current Homicidal Plan: No Access to Homicidal Means: No Identified Victim: pt denies History of harm to others?: No Assessment of Violence: None Noted Violent Behavior Description: na - pt calm, cooperative Does patient have access to weapons?: No Criminal Charges Pending?: No Does patient have a court date: No  Psychosis Hallucinations: Auditory (Sometimes hears deceased mother's voice) Delusions: None noted  Mental Status Report Appear/Hygiene: Disheveled Eye Contact: Fair Motor Activity: Unremarkable Speech: Logical/coherent Level of Consciousness: Quiet/awake Mood: Depressed Affect: Blunted Anxiety Level: Minimal Thought Processes: Coherent;Relevant Judgement: Unimpaired Orientation: Person;Place;Time;Situation Obsessive Compulsive Thoughts/Behaviors: None  Cognitive Functioning Concentration: Decreased Memory: Recent Intact;Remote Intact IQ: Average Insight: Poor Impulse Control:  Poor Appetite: Fair Weight Loss: 0  Weight Gain: 0  Sleep: Decreased Total Hours of Sleep:  (< 5 hrs per day) Vegetative Symptoms: Staying in bed  ADLScreening [facility] Assessment Services) Patient's cognitive ability adequate to safely complete daily activities?: Yes Patient able to express need for assistance with ADLs?: Yes Independently performs ADLs?: Yes (appropriate for developmental age)  Abuse/Neglect [facility]) Physical Abuse: Denies Verbal Abuse: Denies Sexual Abuse: Denies  Prior Inpatient Therapy Prior Inpatient Therapy: Yes Prior Therapy Dates: 2012 Prior Therapy Facilty/Provider(s): [facility]; [facility] Reason for Treatment: STABILIZATION  Prior Outpatient Therapy Prior Outpatient Therapy: Yes Prior Therapy Dates: CURRENTLY Prior Therapy Facilty/Provider(s): [facility] Reason for Treatment: MED MANAGEMENT  ADL Screening (condition at time of admission) Patient's cognitive ability adequate to safely complete daily activities?: Yes Patient able to express need for assistance with ADLs?: Yes Independently performs ADLs?: Yes (appropriate for developmental age) Weakness of Legs: None Weakness of Arms/Hands: None  Home Assistive Devices/Equipment Home Assistive Devices/Equipment: None    Abuse/Neglect Assessment (Assessment to be complete while patient is alone) Physical Abuse: Denies Verbal Abuse: Denies Sexual Abuse: Denies Exploitation of patient/patient's resources: Denies Self-Neglect: Denies Values / Beliefs Cultural Requests During Hospitalization: None Spiritual Requests During Hospitalization: None Consults Spiritual Care Consult Needed: No Social Work Consult Needed: No Merchant navy officer (For Healthcare) Advance Directive: Patient does not have advance directive;Patient would not like information Nutrition Screen- [facility] Adult/WL/AP Patient's home diet: Regular (peanut butter, graham crackers & ginger ale)  Additional Information 1:1 In Past 12  Months?: Yes CIRT Risk: No Elopement Risk: No Does patient have medical clearance?: Yes     Disposition:  Disposition Disposition  of Patient: Referred to;Inpatient treatment program Type of inpatient treatment program: Adult Patient referred to: Other (Comment) (Pt on wait list for [facility])  On Site Evaluation by:   Reviewed with Physician:  Bryson Ha, Rennis Harding 02/15/2012 2:44 PM

## 2012-02-15 NOTE — ED Notes (Signed)
Provided Pt graham crackers, peanut butter and ginger ale per pt request.

## 2012-02-16 NOTE — ED Notes (Signed)
Calm, NAD, sitter at BS. 

## 2012-02-16 NOTE — ED Notes (Addendum)
Amiable, alert, NAD, calm, steady gait, sitter at [facility], pt up to nurses station to use phone, denies complaints.

## 2012-02-16 NOTE — [facility] Counselor (Signed)
Previous ACT staff spoke to patient about his suicidality. Staff noted that patient continues to say that he feels hopeless and that "I don't care anymore, I'm tired of living." Patient still endorses thoughts of cutting his throat. He talked about sometimes hearing his mother's voice and seeing her in his dreams. She did pass away about 10 months ago. Patient denies any HI at this time. He admits to drinking during the past weekend but is not in any need of detox services. He is unsure of his future housing and has recently lost his job. He currently is unable to contract for safety and requires inpatient care.   Upon my shift it was noted that patient was referred to [facility]; confirmed per [facility] 02/16/2012 @ 1730.  Per shift report patient is also under reviewed at [facility] as of 1800 as well. Writer followed up with both facilities and they confirmed receipt of patient's paperwork . Neither facility has any beds at this time but will hold patient's information until a bed becomes available.   Later received a call from [facility] as patient was referred their as well. Writer told by Renda Rolls that patient was declined by Aggie at [facility] for reaching maximum benefit.    Patient has now been referred to [facility]. He is pending acceptance to the [facility] waitlist.   No updated assessment is needed at this time. Patients assessment was updated today 02/16/2012.

## 2012-02-16 NOTE — BH Assessment (Signed)
Assessment Note   [name] [name] is an 31 y.o. male. [name] [name] is an 31 y.o. male that was reassessed this day. Pt continues to endorse SI with plan as well as auditory hallucinations. Pt stated, "I had a bad dream last night and I heard my mother's voice. I am trying to get better." Pt denies HI. Pt unable to contract for safety. Called [facility] and per [place] @ [phone], pt is still on their wait list. Oncoming staff will need to follow up with referral. Completed reassessment, assessment notification and faxed to [facility] to log. Updated ED staff.   Previous Note:  [name] [name] is an 31 y.o. male.  This clinician talked to patient about his suicidality. He continues to say that he feels hopeless and that "I don't care anymore, I'm tired of living." Patient still endorses thoughts of cutting his throat. He talked about sometimes hearing his mother's voice and seeing her in his dreams. She did pass away about 10 months ago. Patient denies any HI at this time. He admits to drinking during the past weekend but is not in any need of detox services. He is unsure of his future housing and has recently lost his job. He currently is unable to contract for safety and requires inpatient care.   Reassessment 02-16-12 Pt continues to present flat and depressed. Pt continues to endorse SI,reports feeling hopeless and is unable to contract for safety.Pt denies HI at this time. Pt has other stressors to include recent job lost and unsure of his housing situation at this time. Pt is unable to contract for safety and inpatient treatment recommended. Spoke with Okey Regal at [facility] who confirms that pt is on the waitlist at 1745 on 02-16-12. Spoke with Aggie Cosier at [facility] who confirmed they have bed availability. Pt information faxed to [facility] for review. Pt is aware of current plan for bed placement at this time.   Axis I: Mood Disorder NOS Axis II: Deferred Axis III:  Past Medical History  Diagnosis  Date  . Mental disorder   . Hypertension   . Depression   . Bipolar 1 disorder   . Irregular heart rate    Axis IV: economic problems, housing problems, other psychosocial or environmental problems and problems related to social environment Axis V: 31-40 impairment in reality testing  Past Medical History:  Past Medical History  Diagnosis Date  . Mental disorder   . Hypertension   . Depression   . Bipolar 1 disorder   . Irregular heart rate     History reviewed. No pertinent past surgical history.  Family History:  Family History  Problem Relation Age of Onset  . Hypertension Mother   . Hypertension Other     Social History:  reports that he has quit smoking. He does not have any smokeless tobacco history on file. He reports that he drinks about 21 ounces of alcohol per week. He reports that he does not use illicit drugs.  Additional Social History:  Alcohol / Drug Use Pain Medications: No; see MAR Prescriptions: Yes; see MAR Over the Counter: No; see MAR Longest period of sobriety (when/how long): unknown Negative Consequences of Use: Personal relationships Withdrawal Symptoms:  (pt denies) Substance #2 Name of Substance 2: ETOH 2 - Age of First Use: teens 2 - Amount (size/oz): currently "just a few beers" 2 - Frequency: this past weekend 2 - Duration: weeks 2 - Last Use / Amount: Saturday night per report  CIWA:  CIWA-Ar BP: 124/72 mmHg Pulse Rate: 78  Nausea and Vomiting: no nausea and no vomiting Tactile Disturbances: none Tremor: no tremor Auditory Disturbances: mild harshness or ability to frighten Paroxysmal Sweats: no sweat visible Visual Disturbances: not present Anxiety: mildly anxious Headache, Fullness in Head: none present Agitation: normal activity Orientation and Clouding of Sensorium: oriented and can do serial additions CIWA-Ar Total: 3  COWS:    Allergies: No Known Allergies  Home Medications:  (Not in a hospital admission)  OB/GYN  Status:  No LMP for male patient.  General Assessment Data Location of Assessment: [facility] ED ACT Assessment: Yes Living Arrangements: Other relatives Can pt return to current living arrangement?: Yes Admission Status: Voluntary Is patient capable of signing voluntary admission?: Yes Transfer from: Acute Hospital Referral Source: Self/Family/Friend  Education Status Is patient currently in school?: No  Risk to self Suicidal Ideation: Yes-Currently Present Suicidal Intent: No Is patient at risk for suicide?: Yes Suicidal Plan?: Yes-Currently Present Specify Current Suicidal Plan: cut throat with knife Access to Means: Yes Specify Access to Suicidal Means: [place]ps What has been your use of drugs/alcohol within the last 12 months?: hx of etoh abuse Previous Attempts/Gestures: Yes How many times?: 2  Other Self Harm Risks: pt denies Triggers for Past Attempts: Anniversary;Unpredictable Intentional Self Injurious Behavior: Damaging Family Suicide History: No Recent stressful life event(s): Conflict (Comment);Job Loss;Financial Problems Persecutory voices/beliefs?: Yes Depression: Yes Depression Symptoms: Despondent;Insomnia;Isolating;Fatigue;Loss of interest in usual pleasures;Feeling worthless/self pity Substance abuse history and/or treatment for substance abuse?: Yes Suicide prevention information given to non-admitted patients: Not applicable  Risk to Others Homicidal Ideation: No Thoughts of Harm to Others: No Current Homicidal Intent: No Current Homicidal Plan: No Access to Homicidal Means: No Identified Victim: pt denies History of harm to others?: No Assessment of Violence: None Noted Violent Behavior Description: pt is restless,quiet,calm, and cooperative Does patient have access to weapons?: No Criminal Charges Pending?: No Does patient have a court date: No  Psychosis Hallucinations: None noted Delusions: None noted  Mental Status Report Appear/Hygiene: Other  (Comment) (dressed in hospital scrubs) Eye Contact: Fair Motor Activity: Unremarkable Speech: Logical/coherent Level of Consciousness: Alert;Quiet/awake Mood: Depressed Affect: Appropriate to circumstance;Depressed Anxiety Level: Minimal Thought Processes: Coherent;Relevant Judgement: Unimpaired Orientation: Person;Place;Time;Situation Obsessive Compulsive Thoughts/Behaviors: None  Cognitive Functioning Concentration: Decreased Memory: Recent Intact;Remote Intact IQ: Average Insight: Poor Impulse Control: Poor Appetite: Fair Weight Loss: 0  Weight Gain: 0  Sleep: Decreased Total Hours of Sleep:  (5 hrs) Vegetative Symptoms: Staying in bed  ADLScreening [facility] Assessment Services) Patient's cognitive ability adequate to safely complete daily activities?: Yes Patient able to express need for assistance with ADLs?: Yes Independently performs ADLs?: Yes (appropriate for developmental age)  Abuse/Neglect [facility]) Physical Abuse: Denies Verbal Abuse: Denies Sexual Abuse: Denies  Prior Inpatient Therapy Prior Inpatient Therapy: Yes Prior Therapy Dates: 2012 Prior Therapy Facilty[facility]Provider([facility]): [facility], [facility] Reason for Treatment: Stabilization  Prior Outpatient Therapy Prior Outpatient Therapy: Yes Prior Therapy Dates: Current Prior Therapy Facilty/Provider(s): [facility] Reason for Treatment: Med Management  ADL Screening (condition at time of admission) Patient's cognitive ability adequate to safely complete daily activities?: Yes Patient able to express need for assistance with ADLs?: Yes Independently performs ADLs?: Yes (appropriate for developmental age) Weakness of Legs: None Weakness of Arms/Hands: None  Home Assistive Devices/Equipment Home Assistive Devices/Equipment: None    Abuse/Neglect Assessment (Assessment to be complete while patient is alone) Physical Abuse: Denies Verbal Abuse: Denies Sexual Abuse: Denies Exploitation of patient/patient's  resources: Denies Self-Neglect: Denies Values /  Beliefs Cultural Requests During Hospitalization: None Spiritual Requests During Hospitalization: None Consults Spiritual Care Consult Needed: No Social Work Consult Needed: No Merchant navy officer (For Healthcare) Advance Directive: Patient does not have advance directive;Patient would not like information Nutrition Screen- [facility] Adult/WL/AP Patient's home diet: Regular (peanut butter, graham crackers & ginger ale)  Additional Information 1:1 In Past 12 Months?: Yes CIRT Risk: No Elopement Risk: No Does patient have medical clearance?: Yes     Disposition:  Disposition Disposition of Patient: Referred to (Pending [facility] and [facility]) Type of inpatient treatment program: Adult Patient referred to: Other (Comment) (Pt is on [facility] waitlist, and pending [facility])  On Site Evaluation by:   Reviewed with Physician:     Bjorn Pippin 02/16/2012 5:47 PM

## 2012-02-17 NOTE — ED Notes (Signed)
During process of discharging patient the patient states he comes her for help and they keep sending him home and if he goes home this time he is going to harm himself. EDP advised.

## 2012-02-17 NOTE — ED Notes (Addendum)
No changes, sitter at BS, pt sleeping. 

## 2012-02-17 NOTE — BH Assessment (Signed)
Assessment Note   [name] [name] is an 31 y.o. male. Pt reported to nurse that if he was d/c he would "kill self". Pt. Continues to endorse depressive symptoms and SI, plan, no intent.  Pt. Calm and cooperative in ED.  Pt. Flat affect, depressed mood.  Pt. Reported "eating some food".  Pt. Reports need to "be in hospital."  Pt reports auditory hallucinations, commanding type.  Pt. Denies any HI, VH.    Axis I: Psychotic Disorder NOS Axis II:  Deferred Axis III:  See below Axis IV:  Loss Axis V:  11-20  Past Medical History:  Past Medical History  Diagnosis Date  . Mental disorder   . Hypertension   . Depression   . Bipolar 1 disorder   . Irregular heart rate     History reviewed. No pertinent past surgical history.  Family History:  Family History  Problem Relation Age of Onset  . Hypertension Mother   . Hypertension Other     Social History:  reports that he has quit smoking. He does not have any smokeless tobacco history on file. He reports that he drinks about 21 ounces of alcohol per week. He reports that he does not use illicit drugs.  Additional Social History:  Alcohol / Drug Use Pain Medications: No; see MAR Prescriptions: Yes; see MAR Over the Counter: No; see MAR Longest period of sobriety (when/how long): unknown Negative Consequences of Use: Personal relationships Withdrawal Symptoms:  (pt denies) Substance #2 Name of Substance 2: ETOH 2 - Age of First Use: teens 2 - Amount (size/oz): currently "just a few beers" 2 - Frequency: this past weekend 2 - Duration: weeks 2 - Last Use / Amount: Saturday night per report  CIWA: CIWA-Ar BP: 126/71 mmHg Pulse Rate: 79  Nausea and Vomiting: no nausea and no vomiting Tactile Disturbances: none Tremor: no tremor Auditory Disturbances: mild harshness or ability to frighten Paroxysmal Sweats: no sweat visible Visual Disturbances: not present Anxiety: mildly anxious Headache, Fullness in Head: none  present Agitation: normal activity Orientation and Clouding of Sensorium: oriented and can do serial additions CIWA-Ar Total: 3  COWS:    Allergies: No Known Allergies  Home Medications:  (Not in a hospital admission)  OB/GYN Status:  No LMP for male patient.  General Assessment Data Location of Assessment: [facility] ED ACT Assessment: Yes Living Arrangements: Other relatives Can pt return to current living arrangement?: Yes Admission Status: Involuntary Is patient capable of signing voluntary admission?: No Transfer from: Acute Hospital Referral Source: Self/Family/Friend  Education Status Is patient currently in school?: No  Risk to self Suicidal Ideation: Yes-Currently Present Suicidal Intent: No Is patient at risk for suicide?: Yes Suicidal Plan?: Yes-Currently Present Specify Current Suicidal Plan: cut throat Access to Means: Yes Specify Access to Suicidal Means: sharps container What has been your use of drugs/alcohol within the last 12 months?: ETOH Previous Attempts/Gestures: Yes How many times?: 2  Other Self Harm Risks: denies Triggers for Past Attempts: Anniversary;Unpredictable Intentional Self Injurious Behavior: Damaging Family Suicide History: No Recent stressful life event(s): Conflict (Comment);Loss (Comment) Persecutory voices/beliefs?: Yes Depression: Yes Depression Symptoms: Despondent Substance abuse history and/or treatment for substance abuse?: No Suicide prevention information given to non-admitted patients: Not applicable  Risk to Others Homicidal Ideation: No Thoughts of Harm to Others: No Current Homicidal Intent: No Current Homicidal Plan: No Access to Homicidal Means: No Identified Victim: denies History of harm to others?: No Assessment of Violence: None Noted Violent Behavior Description: calm Does  patient have access to weapons?: No Criminal Charges Pending?: No Does patient have a court date: No  Psychosis Hallucinations: None  noted Delusions: None noted  Mental Status Report Appear/Hygiene: Other (Comment) Eye Contact: Fair Motor Activity: Freedom of movement Speech: Logical/coherent Level of Consciousness: Alert;Quiet/awake Mood: Depressed Affect: Appropriate to circumstance Anxiety Level: Minimal Thought Processes: Coherent;Relevant Judgement: Impaired Orientation: Person;Place;Time;Situation Obsessive Compulsive Thoughts/Behaviors: None  Cognitive Functioning Concentration: Decreased Memory: Recent Intact;Remote Intact IQ: Average Insight: Poor Impulse Control: Poor Appetite: Fair Weight Loss: 0  Weight Gain: 0  Sleep: Decreased Total Hours of Sleep:  (5 hrs) Vegetative Symptoms: Staying in bed  ADLScreening [facility] Assessment Services) Patient's cognitive ability adequate to safely complete daily activities?: Yes Patient able to express need for assistance with ADLs?: Yes Independently performs ADLs?: Yes (appropriate for developmental age)  Abuse/Neglect [facility]) Physical Abuse: Denies Verbal Abuse: Denies Sexual Abuse: Denies  Prior Inpatient Therapy Prior Inpatient Therapy: Yes Prior Therapy Dates: 2012 Prior Therapy Facilty/Provider(s): [facility], [facility] Reason for Treatment: Stabilization  Prior Outpatient Therapy Prior Outpatient Therapy: Yes Prior Therapy Dates: Current Prior Therapy Facilty/Provider(s): [facility] Reason for Treatment: Med Management  ADL Screening (condition at time of admission) Patient's cognitive ability adequate to safely complete daily activities?: Yes Patient able to express need for assistance with ADLs?: Yes Independently performs ADLs?: Yes (appropriate for developmental age) Weakness of Legs: None Weakness of Arms/Hands: None  Home Assistive Devices/Equipment Home Assistive Devices/Equipment: None    Abuse/Neglect Assessment (Assessment to be complete while patient is alone) Physical Abuse: Denies Verbal Abuse: Denies Sexual Abuse:  Denies Exploitation of patient/patient's resources: Denies Self-Neglect: Denies Values / Beliefs Cultural Requests During Hospitalization: None Spiritual Requests During Hospitalization: None Consults Spiritual Care Consult Needed: No Social Work Consult Needed: No Merchant navy officer (For Healthcare) Advance Directive: Patient does not have advance directive;Patient would not like information Nutrition Screen- [facility] Adult/WL/AP Patient's home diet: Regular  Additional Information 1:1 In Past 12 Months?: Yes CIRT Risk: No Elopement Risk: No Does patient have medical clearance?: Yes     Disposition: IVC sent to magistrate, pt. Reassessed.  Pt. Referred to [facility], [facility].  Disposition Disposition of Patient: Referred to Type of inpatient treatment program: Adult Type of outpatient treatment: Adult Patient referred to: Other (Comment);[facility]  On Site Evaluation by:   Reviewed with Physician:     Barbaraann Boys 02/17/2012 2:59 PM

## 2012-02-17 NOTE — ED Notes (Signed)
Up to b/r, steady gait, no changes, sitter present, fax received from telepsych, recommending out pt f/u, EDP aware, pending orders.

## 2012-02-17 NOTE — ED Notes (Signed)
No changes, pt sleeping, sitter at BS.  

## 2012-02-17 NOTE — ED Notes (Signed)
Speaking with psych MD.

## 2012-02-17 NOTE — ED Provider Notes (Signed)
7:42 AM The patient was seen and evaluated by the psychiatrist Dr. Gary Fleet MD who believes the patient is stable for discharge and does not appear to be a threat to himself or to others.  Please see consult note for full details  Lyanne Co, MD 02/17/12 [phone]

## 2012-02-17 NOTE — ED Notes (Signed)
Pt sleeping, no changes, set up for telepsych, pending interview, sitter at [facility].

## 2012-02-17 NOTE — ED Notes (Signed)
EDP states to hold patient for ACT to re-evaluate.

## 2012-02-18 ENCOUNTER — Emergency Department (HOSPITAL_COMMUNITY): Payer: Self-pay

## 2012-02-18 NOTE — [facility] Counselor (Addendum)
@   1850 [facility] Renee Pain) called to state that pt was being declined because he was stable psychiatrically and that he would do better with outpatient services; notified nurse who will request telepsych on 02/19/12 AM considering new information [name] [name], LCAS, LPC ACT Team    No telepsych needed due to patient's symptoms remaining the same. Dr. Ignacia Palma agrees.

## 2012-02-18 NOTE — ED Notes (Signed)
Patient provided with a snack per request with a caffeine free cola

## 2012-02-18 NOTE — ED Notes (Signed)
Pt is back in room with sitter at the bedside from radiology

## 2012-02-18 NOTE — [facility] Counselor (Signed)
-  Declined by Aggie at [facility] for reaching maximum benefit.   OV and HH have no funding currently available from [facility].  Referred to [facility] 02/18/12 @ 1311. Pt is now pending acceptance to their waitlist. Auth # needs to be obtained from [facility] on 09/15 at 1400.  -Further dis positioning pending results of the telepsych. If telepsych recommends inpatient on-coming staff will need to f/u with [facility] placement.

## 2012-02-18 NOTE — ED Notes (Signed)
Pt requested to see the EDP in reference to his rt hand. Pt states "2 days before coming to the hospital, I punched a wall. I did not start feeling anything because of the state of mind I was in when I got here but now it hurts. Mild swelling observed to the rt thumb, pt has minimum movement with the hand. Notified EDP

## 2012-02-18 NOTE — ED Notes (Signed)
Patient transported to X-ray with sitter 

## 2012-02-18 NOTE — ED Notes (Signed)
Patient was sleeping but was easily aroused. Patient states that he continues to feel like harming himself and he continues to here voices. Sitter at bedside.

## 2012-02-19 MED ORDER — PAROXETINE HCL 20 MG PO TABS
20.0000 mg | ORAL_TABLET | Freq: Every day | ORAL | Status: DC
  Filled 2012-02-19: qty 1

## 2012-02-19 MED ORDER — ZIPRASIDONE HCL 60 MG PO CAPS
60.0000 mg | ORAL_CAPSULE | Freq: Every day | ORAL | Status: DC
  Filled 2012-02-19: qty 1

## 2012-02-19 NOTE — ED Notes (Signed)
Breakfast tray ordered 

## 2012-02-19 NOTE — ED Notes (Signed)
[facility] called and they will pick up patient for transport to [facility] at approx 1715.

## 2012-02-19 NOTE — BH Assessment (Signed)
Assessment Note  Update:  Received call from John at [facility], stating pt accepted to [facility] and his bed available.  Pt accepted to Dr.  Ruben Gottron to bed 2584.  Updated EDP Bernette Mayers and ED staff.  Updated assessment disposition, completed assessment, completed assessment notification and faxed to [facility] to log.  ED staff to arrange transport via [facility] to [place], as pt under IVC.     Disposition:  Disposition Disposition of Patient: Inpatient treatment program Type of inpatient treatment program: Adult Type of outpatient treatment: Adult Patient referred to: Other (Comment) (Pt accepted [facility])  On Site Evaluation by:   Reviewed with Physician:  Asencion Gowda, Rennis Harding 02/19/2012 3:41 PM

## 2012-02-19 NOTE — ED Provider Notes (Signed)
Pt continues to endorse suicidal ideation despite previously being cleared for discharge. Reassessed by Telepsych who recommends inpatient treatment and change in meds from Cymbalta/Seroquel to Paxil/Geodon. Orders placed per their recommendation.   3:41 PM Pt accepted at [facility]. Awaiting Transfer.   Charles B. Bernette Mayers, MD 02/19/12 [phone]

## 2012-02-19 NOTE — BH Assessment (Signed)
Assessment Note   [name] [name] is an 31 y.o. male that was reassessed this day.  Pt continues to endorse SI with plan to cut his throat.  Pt also endorses auditory and visual hallucinations (he reports hearing and seeing his deceased mother telling him to kill himself).  Pt unable to contract for safety.  Pt denies HI.  Pt admits to sporadic alcohol use.  [facility], possible beds available per [facility] @ 1435.  Called [facility], possible beds available per [facility] @ 1432.  Pt received telepsych recommending inpatient care as well as medication adjustments.  Left message for Turner Daniels.  Completed reassessment, assessment notification and faxed to [facility] to log.  Updated ED staff.  Axis I: 296.80 Bipolar Disorder NOS Axis II: Deferred Axis III:  Past Medical History  Diagnosis Date  . Mental disorder   . Hypertension   . Depression   . Bipolar 1 disorder   . Irregular heart rate    Axis IV: other psychosocial or environmental problems, problems related to social environment and problems with primary support group Axis V: 21-30 behavior considerably influenced by delusions or hallucinations OR serious impairment in judgment, communication OR inability to function in almost all areas  Past Medical History:  Past Medical History  Diagnosis Date  . Mental disorder   . Hypertension   . Depression   . Bipolar 1 disorder   . Irregular heart rate     History reviewed. No pertinent past surgical history.  Family History:  Family History  Problem Relation Age of Onset  . Hypertension Mother   . Hypertension Other     Social History:  reports that he has quit smoking. He does not have any smokeless tobacco history on file. He reports that he drinks about 21 ounces of alcohol per week. He reports that he does not use illicit drugs.  Additional Social History:  Alcohol / Drug Use Pain Medications: No; see MAR Prescriptions: Yes; see MAR Over the Counter: No; see MAR Longest period of sobriety  (when/how long): unknown Negative Consequences of Use: Personal relationships Withdrawal Symptoms:  (pt denies) Substance #2 Name of Substance 2: ETOH 2 - Age of First Use: teens 2 - Amount (size/oz): currently "just a few beers" 2 - Frequency: this past weekend 2 - Duration: weeks 2 - Last Use / Amount: Saturday night per report  CIWA: CIWA-Ar BP: 116/73 mmHg Pulse Rate: 83  Nausea and Vomiting: no nausea and no vomiting Tactile Disturbances: none Tremor: no tremor Auditory Disturbances: mild harshness or ability to frighten Paroxysmal Sweats: no sweat visible Visual Disturbances: not present Anxiety: mildly anxious Headache, Fullness in Head: none present Agitation: normal activity Orientation and Clouding of Sensorium: oriented and can do serial additions CIWA-Ar Total: 3  COWS:    Allergies: No Known Allergies  Home Medications:  (Not in a hospital admission)  OB/GYN Status:  No LMP for male patient.  General Assessment Data Location of Assessment: [facility] ED ACT Assessment: Yes Living Arrangements: Other relatives Can pt return to current living arrangement?: Yes Admission Status: Involuntary Is patient capable of signing voluntary admission?: No Transfer from: Acute Hospital Referral Source: Self/Family/Friend  Education Status Is patient currently in school?: No  Risk to self Suicidal Ideation: Yes-Currently Present Suicidal Intent: No Is patient at risk for suicide?: Yes Suicidal Plan?: Yes-Currently Present Specify Current Suicidal Plan: cut throat Access to Means: Yes Specify Access to Suicidal Means: sharps available outside of ED What has been your use of drugs/alcohol within  the last 12 months?: ETOH Previous Attempts/Gestures: Yes How many times?: 2  Other Self Harm Risks: pt denies Triggers for Past Attempts: Anniversary;Unpredictable Intentional Self Injurious Behavior: Damaging Family Suicide History: No Recent stressful life event(s):  Conflict (Comment);Loss (Comment) Persecutory voices/beliefs?: Yes Depression: Yes Depression Symptoms: Despondent;Loss of interest in usual pleasures;Feeling worthless/self pity Substance abuse history and/or treatment for substance abuse?: No Suicide prevention information given to non-admitted patients: Not applicable  Risk to Others Homicidal Ideation: No Thoughts of Harm to Others: No Current Homicidal Intent: No Current Homicidal Plan: No Access to Homicidal Means: No Identified Victim: pt denies History of harm to others?: No Assessment of Violence: None Noted Violent Behavior Description: na - pt calm, cooperative Does patient have access to weapons?: No Criminal Charges Pending?: No Does patient have a court date: No  Psychosis Hallucinations: None noted Delusions: None noted  Mental Status Report Appear/Hygiene: Other (Comment) (casual in scrubs) Eye Contact: Fair Motor Activity: Unremarkable Speech: Logical/coherent Level of Consciousness: Alert;Quiet/awake Mood: Depressed Affect: Appropriate to circumstance Anxiety Level: Minimal Thought Processes: Coherent;Relevant Judgement: Impaired Orientation: Person;Place;Time;Situation Obsessive Compulsive Thoughts/Behaviors: None  Cognitive Functioning Concentration: Decreased Memory: Recent Intact;Remote Intact IQ: Average Insight: Poor Impulse Control: Poor Appetite: Fair Weight Loss: 0  Weight Gain: 0  Sleep: Decreased Total Hours of Sleep: 5  Vegetative Symptoms: Staying in bed  ADLScreening [facility] Assessment Services) Patient's cognitive ability adequate to safely complete daily activities?: Yes Patient able to express need for assistance with ADLs?: Yes Independently performs ADLs?: Yes (appropriate for developmental age)  Abuse/Neglect [facility]) Physical Abuse: Denies Verbal Abuse: Denies Sexual Abuse: Denies  Prior Inpatient Therapy Prior Inpatient Therapy: Yes Prior Therapy Dates: 2012 Prior  Therapy Facilty[facility]Provider([facility]): [facility], [facility] Reason for Treatment: Stabilization  Prior Outpatient Therapy Prior Outpatient Therapy: Yes Prior Therapy Dates: Current Prior Therapy Facilty/Provider(s): [facility] Reason for Treatment: Med Management  ADL Screening (condition at time of admission) Patient's cognitive ability adequate to safely complete daily activities?: Yes Patient able to express need for assistance with ADLs?: Yes Independently performs ADLs?: Yes (appropriate for developmental age) Weakness of Legs: None Weakness of Arms/Hands: None  Home Assistive Devices/Equipment Home Assistive Devices/Equipment: None    Abuse/Neglect Assessment (Assessment to be complete while patient is alone) Physical Abuse: Denies Verbal Abuse: Denies Sexual Abuse: Denies Exploitation of patient/patient's resources: Denies Self-Neglect: Denies Values / Beliefs Cultural Requests During Hospitalization: None Spiritual Requests During Hospitalization: None Consults Spiritual Care Consult Needed: No Social Work Consult Needed: No Merchant navy officer (For Healthcare) Advance Directive: Patient does not have advance directive;Patient would not like information Nutrition Screen- [facility] Adult/WL/AP Patient's home diet: Regular  Additional Information 1:1 In Past 12 Months?: Yes CIRT Risk: No Elopement Risk: No Does patient have medical clearance?: Yes     Disposition:  Disposition Disposition of Patient: Referred to;Inpatient treatment program Type of inpatient treatment program: Adult Type of outpatient treatment: Adult Patient referred to: Other (Comment) (Pending [place])  On Site Evaluation by:   Reviewed with Physician:  Asencion Gowda, Rennis Harding 02/19/2012 2:39 PM

## 2012-03-03 ENCOUNTER — Emergency Department (HOSPITAL_COMMUNITY)
Admission: EM | Admit: 2012-03-03 | Discharge: 2012-03-03 | Disposition: A | Discharge status: Home / Self Care | Payer: Self-pay | Attending: Emergency Medicine | Admitting: Emergency Medicine

## 2012-03-03 ENCOUNTER — Emergency Department (HOSPITAL_COMMUNITY): Payer: Self-pay

## 2012-03-03 ENCOUNTER — Encounter (HOSPITAL_COMMUNITY): Payer: Self-pay | Admitting: Emergency Medicine

## 2012-03-03 DIAGNOSIS — T43505A Adverse effect of unspecified antipsychotics and neuroleptics, initial encounter: Secondary | ICD-10-CM | POA: Insufficient documentation

## 2012-03-03 DIAGNOSIS — T43205A Adverse effect of unspecified antidepressants, initial encounter: Secondary | ICD-10-CM | POA: Insufficient documentation

## 2012-03-03 DIAGNOSIS — I10 Essential (primary) hypertension: Secondary | ICD-10-CM | POA: Insufficient documentation

## 2012-03-03 DIAGNOSIS — T50995A Adverse effect of other drugs, medicaments and biological substances, initial encounter: Secondary | ICD-10-CM | POA: Insufficient documentation

## 2012-03-03 DIAGNOSIS — F319 Bipolar disorder, unspecified: Secondary | ICD-10-CM | POA: Insufficient documentation

## 2012-03-03 DIAGNOSIS — T50905A Adverse effect of unspecified drugs, medicaments and biological substances, initial encounter: Secondary | ICD-10-CM

## 2012-03-03 DIAGNOSIS — Z87891 Personal history of nicotine dependence: Secondary | ICD-10-CM | POA: Insufficient documentation

## 2012-03-03 HISTORY — DX: Unspecified asthma, uncomplicated: J45.909

## 2012-03-03 LAB — BASIC METABOLIC PANEL
BUN: 19 mg/dL (ref 6–23)
Calcium: 9.5 mg/dL (ref 8.4–10.5)
Creatinine, Ser: 0.76 mg/dL (ref 0.50–1.35)
GFR calc Af Amer: >90 mL/min (ref >90)
GFR calc non Af Amer: >90 mL/min (ref >90)
Glucose, Bld: 91 mg/dL (ref 70–99)
Potassium: 3.9 mEq/L (ref 3.5–5.1)

## 2012-03-03 LAB — CBC
MCH: 30.3 pg (ref 26.0–34.0)
MCHC: 34.3 g/dL (ref 30.0–36.0)
Platelets: 322 10*3/uL (ref 150–400)
RDW: 12.9 % (ref 11.5–15.5)

## 2012-03-03 LAB — POCT I-STAT TROPONIN I: Troponin i, poc: 0 ng/mL (ref 0.00–0.08)

## 2012-03-03 MED ORDER — DIAZEPAM 5 MG PO TABS
5.0000 mg | ORAL_TABLET | Freq: Once | ORAL | Status: AC
  Administered 2012-03-03: 5 mg via ORAL
  Filled 2012-03-03: qty 1

## 2012-03-03 MED ORDER — MECLIZINE HCL 25 MG PO TABS
25.0000 mg | ORAL_TABLET | Freq: Once | ORAL | Status: AC
  Administered 2012-03-03: 25 mg via ORAL
  Filled 2012-03-03: qty 1

## 2012-03-03 MED ORDER — MECLIZINE HCL 50 MG PO TABS: 50.0000 mg | ORAL_TABLET | Freq: Three times a day (TID) | ORAL | Status: DC | PRN

## 2012-03-03 MED ORDER — SODIUM CHLORIDE 0.9 % IV BOLUS (SEPSIS)
1000.0000 mL | Freq: Once | INTRAVENOUS | Status: AC
  Administered 2012-03-03: 1000 mL via INTRAVENOUS

## 2012-03-03 NOTE — ED Notes (Signed)
Chest is tender to touch and hurts worse when he breathes in; performed heavy lifting today. Nonradiating. Reports he vomiting once at 8:30 after eating.

## 2012-03-03 NOTE — ED Notes (Signed)
PTAR gave 4 baby aspirin.

## 2012-03-03 NOTE — ED Notes (Addendum)
Pt was unable to have orthostatics taken while standing, pt began trembling and stated that he could no longer stand and was becoming very dizzy. Dr. Rubin Payor notified.

## 2012-03-03 NOTE — ED Notes (Signed)
Patient transported to X-ray 

## 2012-03-03 NOTE — ED Provider Notes (Signed)
History     CSN: [id number]  Arrival date & time 03/03/12  0244   First MD Initiated Contact with Patient 03/03/12 [phone]      Chief Complaint  Patient presents with  . Chest Pain    (Consider location/radiation/quality/duration/timing/severity/associated sxs/prior treatment) HPI Comments: States that since, he is been changed to Paxil and, Geodon.  He's had episodes of dizziness and nausea.  That are pretty persistent and occur on a daily basis.  He, states, that it interfered with his ability to work  Patient is a 31 y.o. male presenting with chest pain. The history is provided by the patient.  Chest Pain The chest pain began more  than 1 month ago. Chest pain occurs frequently. Primary symptoms include nausea and dizziness. Pertinent negatives for primary symptoms include no fever, no shortness of breath, no abdominal pain and no vomiting.  Dizziness also occurs with nausea. Dizziness does not occur with vomiting or weakness.  Pertinent negatives for associated symptoms include no numbness and no weakness.     Past Medical History  Diagnosis Date  . Mental disorder   . Hypertension   . Depression   . Bipolar 1 disorder   . Irregular heart rate   . Asthma     History reviewed. No pertinent past surgical history.  Family History  Problem Relation Age of Onset  . Hypertension Mother   . Hypertension Other     History  Substance Use Topics  . Smoking status: Former Smoker -- 0.5 packs/day  . Smokeless tobacco: Not on file  . Alcohol Use: 0.0 oz/week     Pt stated he is trying to quit.  Last reported drink was 09/18      Review of Systems  Constitutional: Negative for fever and chills.  Respiratory: Negative for shortness of breath.   Cardiovascular: Positive for chest pain.  Gastrointestinal: Positive for nausea. Negative for vomiting and abdominal pain.  Neurological: Positive for dizziness. Negative for weakness, numbness and headaches.    Allergies    Review of patient's allergies indicates no known allergies.  Home Medications   Current Outpatient Rx  Name Route Sig Dispense Refill  . GABAPENTIN 300 MG PO CAPS Oral Take 300 mg by mouth 3 (three) times daily. For anxiety    . PAROXETINE HCL 40 MG PO TABS Oral Take 40 mg by mouth daily.    . QUETIAPINE FUMARATE 100 MG PO TABS Oral Take 100 mg by mouth at bedtime.    Marland Kitchen ZIPRASIDONE HCL 60 MG PO CAPS Oral Take 60 mg by mouth at bedtime.    . MECLIZINE HCL 50 MG PO TABS Oral Take 1 tablet (50 mg total) by mouth 3 (three) times daily as needed. 30 tablet 0    BP 112/71  Pulse 69  Temp 98.2 F (36.8 C) (Oral)  Resp 0  SpO2 99%  Physical Exam  Constitutional: He is oriented to person, place, and time. He appears well-developed and well-nourished.  HENT:  Head: Normocephalic.  Eyes: Pupils are equal, round, and reactive to light.  Neck: Normal range of motion.  Cardiovascular: Normal rate.   Pulmonary/Chest: Effort normal. No respiratory distress. He exhibits tenderness.  Abdominal: Soft. He exhibits no distension.  Musculoskeletal: Normal range of motion. He exhibits no edema and no tenderness.  Neurological: He is alert and oriented to person, place, and time.  Skin: Skin is warm. No rash noted.    ED Course  Procedures (including critical care time)   Labs Reviewed  BASIC METABOLIC PANEL  CBC  POCT I-STAT TROPONIN I  LAB REPORT - SCANNED   Dg Chest 2 View  03/03/2012  *RADIOLOGY REPORT*  Clinical Data: Chest pain.  CHEST - 2 VIEW  Comparison: 08/22/2011  Findings: Two views of the chest were obtained.  Stable appearance of the heart and mediastinum.  The right costophrenic angle is not completely visualized on the frontal view.  No evidence for airspace disease, edema or pleural effusions.  Bony thorax is intact.  IMPRESSION: No acute cardiopulmonary disease.   Original Report Authenticated By: Richarda Overlie, M.D.      1. Medication reaction       MDM   LS is  experiencing no side effects of Geodon and Paxil.  I suggested that he speak with his therapist for review of his medications        Arman Filter, NP 03/04/12 2318

## 2012-03-03 NOTE — ED Provider Notes (Signed)
Medical screening examination/treatment/procedure(s) were performed by non-physician practitioner and as supervising physician I was immediately available for consultation/collaboration.   Gwyneth Sprout, MD 03/03/12 2026

## 2012-03-03 NOTE — ED Provider Notes (Signed)
Care of pt assumed from [place], NP, at 0600. Pt with report of dizziness with exertion for past several days in addition to chest pain which is reproducible on palpation and worsens with movements such as heavy lifting. Hx of bipolar disorder, states meds were recently changed within the past 2 weeks. His lab evaluation is negative. It is possible that his dizziness is medication related. He was treated with meclizine and did have some residual dizziness with ambulation. He was re-medicated with valium and was then able to ambulate without difficulty. Feel he is stable for discharge home at this time. He already has an appt with his psychiatrist on Tues and will discuss his medications at that time. He was provided with rx for meclizine. Reasons to return for worsening, changing, new sx discussed.  Grant Fontana, PA-C 03/03/12 1328

## 2012-03-03 NOTE — ED Notes (Addendum)
Pt c/o dizziness and unable to ambulate past doctor's office.  [name] [name], [place] made aware.

## 2012-03-08 ENCOUNTER — Emergency Department (HOSPITAL_COMMUNITY)
Admission: EM | Admit: 2012-03-08 | Discharge: 2012-03-14 | Disposition: A | Discharge status: Home / Self Care | Payer: Self-pay | Attending: Emergency Medicine | Admitting: Emergency Medicine

## 2012-03-08 DIAGNOSIS — J45909 Unspecified asthma, uncomplicated: Secondary | ICD-10-CM | POA: Insufficient documentation

## 2012-03-08 DIAGNOSIS — R45851 Suicidal ideations: Secondary | ICD-10-CM | POA: Insufficient documentation

## 2012-03-08 DIAGNOSIS — I10 Essential (primary) hypertension: Secondary | ICD-10-CM | POA: Insufficient documentation

## 2012-03-08 DIAGNOSIS — R42 Dizziness and giddiness: Secondary | ICD-10-CM | POA: Insufficient documentation

## 2012-03-08 LAB — RAPID URINE DRUG SCREEN, HOSP PERFORMED
Barbiturates: NOT DETECTED
Benzodiazepines: POSITIVE — AB

## 2012-03-08 LAB — COMPREHENSIVE METABOLIC PANEL
ALT: 22 U/L (ref 0–53)
AST: 20 U/L (ref 0–37)
Albumin: 3.7 g/dL (ref 3.5–5.2)
Alkaline Phosphatase: 99 U/L (ref 39–117)
Chloride: 105 mEq/L (ref 96–112)
Potassium: 3.4 mEq/L — ABNORMAL LOW (ref 3.5–5.1)
Sodium: 140 mEq/L (ref 135–145)
Total Bilirubin: 1.3 mg/dL — ABNORMAL HIGH (ref 0.3–1.2)

## 2012-03-08 LAB — CBC WITH DIFFERENTIAL/PLATELET
Basophils Absolute: 0 10*3/uL (ref 0.0–0.1)
Basophils Relative: 0 % (ref 0–1)
Hemoglobin: 13 g/dL (ref 13.0–17.0)
MCHC: 34.2 g/dL (ref 30.0–36.0)
Monocytes Relative: 11 % (ref 3–12)
Neutro Abs: 5.5 10*3/uL (ref 1.7–7.7)
Neutrophils Relative %: 65 % (ref 43–77)
Platelets: 284 10*3/uL (ref 150–400)
RDW: 13.2 % (ref 11.5–15.5)

## 2012-03-08 LAB — ETHANOL: Alcohol, Ethyl (B): <11 mg/dL (ref 0–11)

## 2012-03-08 LAB — ACETAMINOPHEN LEVEL: Acetaminophen (Tylenol), Serum: <15 ug/mL (ref 10–30)

## 2012-03-08 LAB — SALICYLATE LEVEL: Salicylate Lvl: <2 mg/dL — ABNORMAL LOW (ref 2.8–20.0)

## 2012-03-08 MED ORDER — PAROXETINE HCL 20 MG PO TABS: 40.0000 mg | ORAL_TABLET | Freq: Every day | ORAL | Status: DC

## 2012-03-08 MED ORDER — ALUM & MAG HYDROXIDE-SIMETH 200-200-20 MG/5ML PO SUSP: 30.0000 mL | ORAL | Status: DC | PRN

## 2012-03-08 MED ORDER — LORAZEPAM 1 MG PO TABS
1.0000 mg | ORAL_TABLET | Freq: Three times a day (TID) | ORAL | Status: DC | PRN
  Administered 2012-03-08 – 2012-03-13 (×3): 1 mg via ORAL
  Filled 2012-03-08 (×3): qty 1

## 2012-03-08 MED ORDER — IBUPROFEN 600 MG PO TABS: 600.0000 mg | ORAL_TABLET | Freq: Three times a day (TID) | ORAL | Status: DC | PRN

## 2012-03-08 MED ORDER — ZIPRASIDONE HCL 20 MG PO CAPS
60.0000 mg | ORAL_CAPSULE | Freq: Every day | ORAL | Status: DC
  Administered 2012-03-08 – 2012-03-10 (×3): 60 mg via ORAL
  Filled 2012-03-08 (×3): qty 3

## 2012-03-08 MED ORDER — QUETIAPINE FUMARATE 100 MG PO TABS
200.0000 mg | ORAL_TABLET | Freq: Every day | ORAL | Status: DC
  Filled 2012-03-08 (×2): qty 1

## 2012-03-08 MED ORDER — PAROXETINE HCL 20 MG PO TABS
40.0000 mg | ORAL_TABLET | Freq: Every day | ORAL | Status: DC
  Administered 2012-03-08 – 2012-03-11 (×4): 40 mg via ORAL
  Filled 2012-03-08 (×4): qty 2

## 2012-03-08 MED ORDER — QUETIAPINE FUMARATE 100 MG PO TABS
100.0000 mg | ORAL_TABLET | Freq: Every day | ORAL | Status: DC
  Administered 2012-03-08 – 2012-03-10 (×3): 100 mg via ORAL
  Filled 2012-03-08: qty 1

## 2012-03-08 MED ORDER — ZIPRASIDONE HCL 20 MG PO CAPS
60.0000 mg | ORAL_CAPSULE | Freq: Two times a day (BID) | ORAL | Status: DC
  Administered 2012-03-09 – 2012-03-11 (×5): 60 mg via ORAL
  Filled 2012-03-08 (×5): qty 3

## 2012-03-08 MED ORDER — ONDANSETRON HCL 4 MG PO TABS: 4.0000 mg | ORAL_TABLET | Freq: Three times a day (TID) | ORAL | Status: DC | PRN

## 2012-03-08 MED ORDER — GABAPENTIN 300 MG PO CAPS
300.0000 mg | ORAL_CAPSULE | Freq: Three times a day (TID) | ORAL | Status: DC
  Administered 2012-03-08 – 2012-03-13 (×18): 300 mg via ORAL
  Filled 2012-03-08 (×22): qty 1

## 2012-03-08 MED ORDER — GABAPENTIN 300 MG PO CAPS: 300.0000 mg | ORAL_CAPSULE | Freq: Three times a day (TID) | ORAL | Status: DC

## 2012-03-08 MED ORDER — POTASSIUM CHLORIDE CRYS ER 20 MEQ PO TBCR
40.0000 meq | EXTENDED_RELEASE_TABLET | Freq: Once | ORAL | Status: AC
  Administered 2012-03-08: 40 meq via ORAL
  Filled 2012-03-08: qty 2

## 2012-03-08 MED ORDER — ACETAMINOPHEN 325 MG PO TABS
650.0000 mg | ORAL_TABLET | ORAL | Status: DC | PRN
  Administered 2012-03-10: 650 mg via ORAL
  Filled 2012-03-08: qty 2

## 2012-03-08 MED ORDER — ONDANSETRON 4 MG PO TBDP
4.0000 mg | ORAL_TABLET | Freq: Once | ORAL | Status: AC
  Administered 2012-03-08: 4 mg via ORAL
  Filled 2012-03-08: qty 1

## 2012-03-08 NOTE — ED Notes (Signed)
Pt c/o nausea, md notified

## 2012-03-08 NOTE — ED Notes (Signed)
Pt placed in blue scrubs and personal items such as shirt hat keys placed in bag and placed at nurses station.

## 2012-03-08 NOTE — ED Provider Notes (Signed)
Medical screening examination/treatment/procedure(s) were performed by non-physician practitioner and as supervising physician I was immediately available for consultation/collaboration.  Arriyah Madej R. Tamberlyn Midgley, MD 03/08/12 0700 
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
## 2012-03-08 NOTE — BH Assessment (Signed)
Assessment Note   [name] [name] is an 31 y.o. male. Patient presenting to [facility] stating that he is depressed and suicidal. Patient stating he went to a bridge last night with intentions to jump off but stopped himself and came to the ER for help. He has a history of depression and Bipolar Disorder. He continues to report suicidal thoughts and sts he is unable to contract for safety. Sts he has tried to commit suicide in the past especially after his mother passed away 05-12-2011. Pt continues to have difficulty dealing with his mothers death, reports problems conflict with his current girlfriend, limited support from his only family which is his sister,quit his job today w/o cause, and doesn't have a stable place to live. He denies HI. Sts that he has auditory hallucinations that tell him "negative things" he is "worthless, never going to amount to anything, nothing but trash, and hopeless". Patient has reports visual hallucinations of his deceased mother. He denies drug and alcohol use during the assessments. Sts that he did drank a couple of beers several days in a row last week.  Patient however told other staff that he drank today after a conflict with his girlfriend. ETOH=no results found.   Pt pending placement due to his inability to contract for safety.    Axis I: Bipolar, Depressed with Psychotic Features Axis II: Deferred Axis III:  Past Medical History  Diagnosis Date  . Mental disorder   . Hypertension   . Depression   . Bipolar 1 disorder   . Irregular heart rate   . Asthma    Axis IV: economic problems, educational problems, housing problems, occupational problems, other psychosocial or environmental problems, problems related to social environment, problems with access to health care services and problems with primary support group Axis V: 21-30 behavior considerably influenced by delusions or hallucinations OR serious impairment in judgment, communication OR inability to  function in almost all areas  Past Medical History:  Past Medical History  Diagnosis Date  . Mental disorder   . Hypertension   . Depression   . Bipolar 1 disorder   . Irregular heart rate   . Asthma     No past surgical history on file.  Family History:  Family History  Problem Relation Age of Onset  . Hypertension Mother   . Hypertension Other     Social History:  reports that he has quit smoking. He does not have any smokeless tobacco history on file. He reports that he drinks alcohol. He reports that he does not use illicit drugs.  Additional Social History:  Alcohol / Drug Use Pain Medications: None Reported; See MAR Prescriptions: Yes; See MAR Over the Counter: No; See MAR History of alcohol / drug use?: Yes Longest period of sobriety (when/how long): unknown Withdrawal Symptoms:  (pt denies) Substance #1 Name of Substance 1: Alcohlol 1 - Age of First Use: 31 yrs old 1 - Amount (size/oz): 5-6 beers at a time 1 - Frequency: "rarely" 1 - Duration: drink all last week; no alcohol this week 1 - Last Use / Amount: "I drank a couple of beers last week for a couple days in a row"  CIWA: CIWA-Ar BP: 127/70 mmHg Pulse Rate: 82  COWS:    Allergies: No Known Allergies  Home Medications:  (Not in a hospital admission)  OB/GYN Status:  No LMP for male patient.  General Assessment Data Location of Assessment: [facility] Assessment: Yes Living Arrangements: Other relatives Can pt  return to current living arrangement?: No Admission Status: Involuntary Is patient capable of signing voluntary admission?: No Transfer from: Acute Hospital Referral Source: Self/Family/Friend  Education Status Is patient currently in school?: No  Risk to self Suicidal Ideation: Yes-Currently Present Suicidal Intent: No Is patient at risk for suicide?: Yes Suicidal Plan?: Yes-Currently Present (reportedly went to a bridge last night w/ intentions to jump) Specify Current Suicidal  Plan:  (jump off a bridge; went to bridge last night) Access to Means: Yes (bridges) Specify Access to Suicidal Means:  (access to bridges; ability to jump) What has been your use of drugs/alcohol within the last 12 months?:  (alcohol ) Previous Attempts/Gestures: Yes How many times?:  (2x's) Other Self Harm Risks:  (patient denies) Triggers for Past Attempts: Anniversary;Unpredictable;Other (Comment) (mothers death 05-06-2011) Intentional Self Injurious Behavior: Damaging Family Suicide History: No Recent stressful life event(s): Conflict (Comment) Persecutory voices/beliefs?: Yes Depression: Yes Depression Symptoms: Despondent Substance abuse history and/or treatment for substance abuse?: Yes Suicide prevention information given to non-admitted patients: Not applicable  Risk to Others Homicidal Ideation: No Thoughts of Harm to Others: No Current Homicidal Intent: No Current Homicidal Plan: No Access to Homicidal Means: No Identified Victim:  (pt denies) History of harm to others?: No Assessment of Violence: None Noted Violent Behavior Description:  (pt calm and cooperative during the assessment) Does patient have access to weapons?: No Criminal Charges Pending?: No Does patient have a court date: No  Psychosis Hallucinations: Auditory;Visual (aud hallucinations; visions of mother) Delusions:  (AV-Voices tell me I am nothing (demeaning);Vis- Deceased mom)  Mental Status Report Appear/Hygiene: Disheveled;Poor hygiene Eye Contact: Fair Motor Activity: Freedom of movement;Unremarkable Speech: Logical/coherent Level of Consciousness: Alert;Quiet/awake Mood: Depressed Affect: Appropriate to circumstance Anxiety Level: Minimal Thought Processes: Relevant Judgement: Impaired Orientation: Person;Place;Time;Situation Obsessive Compulsive Thoughts/Behaviors: None  Cognitive Functioning Concentration: Normal Memory: Recent Intact;Remote Intact IQ: Average Insight:  Poor Impulse Control: Poor Appetite: Good Weight Loss:  (none reported) Weight Gain:  (none reported) Sleep: Decreased Total Hours of Sleep:  (pt sts he has not slept in 2-3 days) Vegetative Symptoms: None  ADLScreening [facility] Assessment Services) Patient's cognitive ability adequate to safely complete daily activities?: Yes Patient able to express need for assistance with ADLs?: Yes Independently performs ADLs?: Yes (appropriate for developmental age)  Abuse/Neglect [facility]) Physical Abuse: Denies Verbal Abuse: Denies Sexual Abuse: Denies  Prior Inpatient Therapy Prior Inpatient Therapy: Yes Prior Therapy Dates: 2012 Prior Therapy Facilty/Provider(s): [facility]2, [facility] Reason for Treatment: Stabilization  Prior Outpatient Therapy Prior Outpatient Therapy: Yes Prior Therapy Dates: Current Prior Therapy Facilty/Provider(s): [facility] Reason for Treatment: Med Management  ADL Screening (condition at time of admission) Patient's cognitive ability adequate to safely complete daily activities?: Yes Patient able to express need for assistance with ADLs?: Yes Independently performs ADLs?: Yes (appropriate for developmental age) Weakness of Legs: None Weakness of Arms/Hands: None  Home Assistive Devices/Equipment Home Assistive Devices/Equipment: None    Abuse/Neglect Assessment (Assessment to be complete while patient is alone) Physical Abuse: Denies Verbal Abuse: Denies Sexual Abuse: Denies Exploitation of patient/patient's resources: Denies Self-Neglect: Denies Values / Beliefs Cultural Requests During Hospitalization: None Spiritual Requests During Hospitalization: None   Advance Directives (For Healthcare) Advance Directive: Patient does not have advance directive Nutrition Screen- [facility] Patient's home diet: Regular  Additional Information 1:1 In Past 12 Months?: Yes CIRT Risk: No Elopement Risk: No Does patient have medical clearance?: Yes      Disposition:  Disposition Disposition of Patient: Inpatient treatment program Type  of inpatient treatment program: Adult Type of outpatient treatment: Adult Patient referred to: Other (Comment) (Pending referrals to  [facility], [facility], and [facility])  On Site Evaluation by:   Reviewed with Physician:     Melynda Ripple [facility] 03/08/2012 11:07 PM

## 2012-03-08 NOTE — ED Provider Notes (Signed)
Tele psych evaluated patient. Inpatient psych admission recommended. Will follow recs.   Richardean Canal, MD 03/08/12 2219

## 2012-03-08 NOTE — ED Notes (Signed)
[id number]<YN> Expected date:03/08/12<BR> Expected time: 5:05 AM<BR> Means of arrival:Ambulance<BR> Comments:<BR> Suicidal etoh

## 2012-03-08 NOTE — ED Notes (Addendum)
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 Pt felt dizzy pt sat down. Hx of anxiety. ETOH  Last drink 2 hours ago. 2 shots 2 beers. Pt told EMS he felt suicidal.

## 2012-03-08 NOTE — ED Notes (Addendum)
Checked on sitter to see if she needed a break she stated she did not need one
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
## 2012-03-09 NOTE — ED Notes (Signed)
Laying in his bed.

## 2012-03-09 NOTE — ED Notes (Signed)
Pt laying in his bed

## 2012-03-09 NOTE — ED Notes (Signed)
When writer took medications into pt she asked if the voices and the visions of his mom were any better and pt said yes. He was given a sandwich with his Geodon

## 2012-03-09 NOTE — [facility] Counselor (Signed)
[name] [name] has reviewed clinical information and has agreed with [name] [name] assessment on 02/16/2012 that patient has reached maximum benefit at [facility].

## 2012-03-09 NOTE — ED Provider Notes (Signed)
Patient continues to be psychotic with visual hallucination of his mother, and this makes him sad. When he is sad he becomes suicidal. Tele-psychiatry has recommended inpatient treatment . ACT is working on placement. He has voluntary.  Flint Melter, MD 03/09/12 1006

## 2012-03-10 NOTE — ED Provider Notes (Addendum)
7:36 AM Filed Vitals:   03/10/12 0637  BP: 115/76  Pulse: 83  Temp: 97.7 F (36.5 C)  Resp: 18   awaiting placement. tele psych evaluated the pt and recommends inpatient. No complaints this AM.   Lyanne Co, MD 03/10/12 0736  7:34 AM Seen by Dr Shela Commons two days ago, still thought he needs inpatient treatment. Will ask him to reevaluate today. Otherwise still awaiting [facility]. No complaints this AM Filed Vitals:   03/13/12 0610  BP: 94/64  Pulse: 110  Temp: 97.6 F (36.4 C)  Resp: [address]     Lyanne Co, MD 03/13/12 [phone]

## 2012-03-10 NOTE — ED Notes (Signed)
Laying in bed watching TV.  

## 2012-03-10 NOTE — ED Notes (Signed)
Pt sleeping but easily arousable, said he's going to take a shower after lunch

## 2012-03-10 NOTE — ED Notes (Signed)
Sandwich given with Geodon medication.

## 2012-03-10 NOTE — ED Notes (Signed)
Patient stated that still having thoughts of hurting self. See psych. Assessment.

## 2012-03-10 NOTE — ED Notes (Signed)
Laying in his bed watching TV 

## 2012-03-10 NOTE — ED Notes (Signed)
Pt is eating his breakfast.

## 2012-03-10 NOTE — ED Notes (Signed)
Pt up to the shower.

## 2012-03-10 NOTE — BH Assessment (Signed)
Assessment Note   [name] [name] is an 31 y.o. male.   Pt continues to meet criteria for Inptx and has been declined by [facility] for maximizing therapeutic benefit.  Pt reports improvement in functioning but remains in need of placement at this moment.  [facility], [place], [place], [place], [facility] are full as are other facilities in the state.  Weekday staff will follow up based on options.  Dr. Henrene Hawking will consult with pt in the AM to determine if he needs continued tx thru Inptx since his voices are less intense and frequent in command.    Axis I: Schizoaffective Disorder Axis II: Deferred Axis III:  Past Medical History  Diagnosis Date  . Mental disorder   . Hypertension   . Depression   . Bipolar 1 disorder   . Irregular heart rate   . Asthma    Axis IV: economic problems, other psychosocial or environmental problems, problems related to social environment and problems with primary support group Axis V: 31-40 impairment in reality testing  Past Medical History:  Past Medical History  Diagnosis Date  . Mental disorder   . Hypertension   . Depression   . Bipolar 1 disorder   . Irregular heart rate   . Asthma     No past surgical history on file.  Family History:  Family History  Problem Relation Age of Onset  . Hypertension Mother   . Hypertension Other     Social History:  reports that he has quit smoking. He does not have any smokeless tobacco history on file. He reports that he drinks alcohol. He reports that he does not use illicit drugs.  Additional Social History:  Alcohol / Drug Use Pain Medications: None Reported; See MAR Prescriptions: Yes; See MAR Over the Counter: No; See MAR History of alcohol / drug use?: Yes Longest period of sobriety (when/how long): unknown Withdrawal Symptoms:  (pt denies) Substance #1 Name of Substance 1: Alcohlol 1 - Age of First Use: 31 yrs old 1 - Amount (size/oz): 5-6 beers at a time 1 - Frequency: "rarely" 1 -  Duration: drink all last week; no alcohol this week 1 - Last Use / Amount: "I drank a couple of beers last week for a couple days in a row"  CIWA: CIWA-Ar BP: 124/81 mmHg Pulse Rate: 87  COWS:    Allergies: No Known Allergies  Home Medications:  (Not in a hospital admission)  OB/GYN Status:  No LMP for male patient.  General Assessment Data Location of Assessment: [facility] Assessment: Yes Living Arrangements: Other relatives Can pt return to current living arrangement?: No Admission Status: Involuntary Is patient capable of signing voluntary admission?: No Transfer from: Acute Hospital Referral Source: MD  Education Status Is patient currently in school?: No  Risk to self Suicidal Ideation: Yes-Currently Present Suicidal Intent: No Is patient at risk for suicide?: Yes Suicidal Plan?: Yes-Currently Present Specify Current Suicidal Plan: traffic Access to Means: Yes Specify Access to Suicidal Means: jump off bridge What has been your use of drugs/alcohol within the last 12 months?: yes Previous Attempts/Gestures: Yes How many times?: 2  Other Self Harm Risks: no Triggers for Past Attempts: Anniversary;Unpredictable;Other (Comment) Intentional Self Injurious Behavior: Damaging Comment - Self Injurious Behavior: na Family Suicide History: No Recent stressful life event(s): Conflict (Comment) Persecutory voices/beliefs?: Yes Depression: Yes Depression Symptoms: Tearfulness;Isolating;Fatigue;Guilt;Loss of interest in usual pleasures;Feeling worthless/self pity;Feeling angry/irritable Substance abuse history and/or treatment for substance abuse?: No Suicide prevention information given to  non-admitted patients: Yes  Risk to Others Homicidal Ideation: No Thoughts of Harm to Others: No Current Homicidal Intent: No Current Homicidal Plan: No Access to Homicidal Means: No Identified Victim: na History of harm to others?: No Assessment of Violence: None Noted Violent  Behavior Description: cooperative Does patient have access to weapons?: No Criminal Charges Pending?: No Does patient have a court date: No  Psychosis Hallucinations: Auditory;With command;Visual Delusions: Unspecified;Persecutory  Mental Status Report Appear/Hygiene: Disheveled;Poor hygiene Eye Contact: Fair Motor Activity: Unremarkable Speech: Logical/coherent Level of Consciousness: Alert;Quiet/awake Mood: Depressed;Anxious Affect: Appropriate to circumstance Anxiety Level: Minimal Thought Processes: Relevant;Tangential Judgement: Impaired Orientation: Person;Place;Time;Situation Obsessive Compulsive Thoughts/Behaviors: Minimal  Cognitive Functioning Concentration: Decreased Memory: Recent Intact;Remote Intact IQ: Average Insight: Poor Impulse Control: Poor Appetite: Fair Weight Loss: 0  Weight Gain: 0  Sleep: Increased Total Hours of Sleep: 12  Vegetative Symptoms: None  ADLScreening [facility]) Patient's cognitive ability adequate to safely complete daily activities?: Yes Patient able to express need for assistance with ADLs?: Yes Independently performs ADLs?: Yes (appropriate for developmental age)  Abuse/Neglect [facility]) Physical Abuse: Denies Verbal Abuse: Denies Sexual Abuse: Denies  Prior Inpatient Therapy Prior Inpatient Therapy: Yes Prior Therapy Dates: 2012 Prior Therapy Facilty/Provider(s): [facility], [facility] Reason for Treatment: Stabilization  Prior Outpatient Therapy Prior Outpatient Therapy: Yes Prior Therapy Dates: Current Prior Therapy Facilty/Provider(s): [facility] Reason for Treatment: Med Management  ADL Screening (condition at time of admission) Patient's cognitive ability adequate to safely complete daily activities?: Yes Patient able to express need for assistance with ADLs?: Yes Independently performs ADLs?: Yes (appropriate for developmental age) Weakness of Legs: None Weakness of Arms/Hands: None  Home Assistive  Devices/Equipment Home Assistive Devices/Equipment: None    Abuse/Neglect Assessment (Assessment to be complete while patient is alone) Physical Abuse: Denies Verbal Abuse: Denies Sexual Abuse: Denies Exploitation of patient/patient's resources: Denies Self-Neglect: Denies Values / Beliefs Cultural Requests During Hospitalization: None Spiritual Requests During Hospitalization: None   Advance Directives (For Healthcare) Advance Directive: Patient does not have advance directive Nutrition Screen- [facility] Patient's home diet: Regular  Additional Information 1:1 In Past 12 Months?: Yes CIRT Risk: No Elopement Risk: No Does patient have medical clearance?: Yes     Disposition:  Disposition Disposition of Patient: Inpatient treatment program Type of inpatient treatment program: Adult Type of outpatient treatment: Adult Patient referred to: Other (Comment) (no beds avaible on weekend)  On Site Evaluation by:   Reviewed with Physician:     Titus Mould, Eppie Gibson 03/10/2012 4:34 PM

## 2012-03-11 ENCOUNTER — Encounter (HOSPITAL_COMMUNITY): Payer: Self-pay

## 2012-03-11 DIAGNOSIS — F3130 Bipolar disorder, current episode depressed, mild or moderate severity, unspecified: Secondary | ICD-10-CM

## 2012-03-11 MED ORDER — ZIPRASIDONE HCL 20 MG PO CAPS
120.0000 mg | ORAL_CAPSULE | Freq: Every day | ORAL | Status: DC
  Administered 2012-03-11 – 2012-03-13 (×3): 120 mg via ORAL
  Filled 2012-03-11: qty 4
  Filled 2012-03-11 (×2): qty 6
  Filled 2012-03-11: qty 2
  Filled 2012-03-11: qty 6

## 2012-03-11 MED ORDER — PAROXETINE HCL 30 MG PO TABS
60.0000 mg | ORAL_TABLET | Freq: Every day | ORAL | Status: DC
  Administered 2012-03-12 – 2012-03-13 (×2): 60 mg via ORAL
  Filled 2012-03-11 (×3): qty 2

## 2012-03-11 NOTE — Consult Note (Signed)
Reason for Consult: Bipolar disorder, Depression, and suicidal ideation Referring Physician: Dr. Claudie Revering [name] [name] is an 31 y.o. male.  HPI: Patient was seen and chart reviewed. Patient has been suffering with the bipolar disorder, depression, and suicidal ideation. Patient reported he tried to jump out of the bridge, but stopped himself and the his neighbor called the emergency medical services. Patient stated he want his voices in his head. Go away and the concerns about his mother's death, should be minimized. Patient has been thinking negatively. Patient lives with his sister and brother-in-law. Patient stated he has been depressed, crying, and the continue feel suicidal ideation. Patient has been receiving medication management from the [facility] and his current medications were Seroquel, Geodon, Paxil and Neurontin. Patient stated he has been compliant with his medication, but not working for him yet. Patient reported he drank alcohol but his blood alcohol level was negative. Patient has no significant the positive findings for drug of abuse.  Past Medical History  Diagnosis Date  . Mental disorder   . Hypertension   . Depression   . Bipolar 1 disorder   . Irregular heart rate   . Asthma     History reviewed. No pertinent past surgical history.  Family History  Problem Relation Age of Onset  . Hypertension Mother   . Hypertension Other     Social History:  reports that he has quit smoking. He does not have any smokeless tobacco history on file. He reports that he drinks alcohol. He reports that he does not use illicit drugs.  Allergies: No Known Allergies  Medications: I have reviewed the patient's current medications.  No results found for this or any previous visit (from the past 48 hour(s)).  No results found.  Positive for anxiety, bad mood, bipolar, borderline personality disorder, depression, learning difficulty, mood swings, obesity and  sleep disturbance Blood pressure 101/66, pulse 85, temperature 98 F (36.7 C), temperature source Oral, resp. rate 17, SpO2 95.00%.   Assessment/Plan: Bipolar disorder, most recent episode is depression.  Patient has been waiting for the inpatient hospitalization as per the telepsychiatric recommendation. Patient has received maximum therapeutic benefit. His from the behavioral health hospital. Patient receives medication management while in the emergency department. Increasing his the Paxil to 60 mg daily, and the Geodon 120 mg at bedtime. Patient may continue taking Neurontin 300 mg 3 times daily.   Marianita Botkin,[name]. 03/11/2012, 5:50 PM

## 2012-03-11 NOTE — ED Notes (Signed)
Patient is asleep.  

## 2012-03-11 NOTE — ED Provider Notes (Signed)
No new c/o  Donnetta Hutching, MD 03/11/12 [phone]

## 2012-03-11 NOTE — Progress Notes (Signed)
CSW submitted clinicals for [facility] referral. [facility] to return call to CSW or ACT regarding status of referral. .Clinical social worker continuing to follow pt to assist with pt dc plans and further csw needs.   Catha Gosselin, LCSWA  [phone] .03/11/2012 11:20pm

## 2012-03-12 NOTE — Progress Notes (Addendum)
CSW spoke with [facility], Stephani Police, and confirmed that patient has been accepted and on the wait list. At this time, [facility] unable to provide estimated days from being admitted to [facility].Clinical social worker continuing to follow pt to assist with pt dc plans and further csw needs.   Catha Gosselin, LCSWA  [phone] .03/12/2012 1735pm

## 2012-03-13 DIAGNOSIS — Z634 Disappearance and death of family member: Secondary | ICD-10-CM

## 2012-03-13 DIAGNOSIS — F319 Bipolar disorder, unspecified: Secondary | ICD-10-CM

## 2012-03-13 NOTE — Consult Note (Signed)
Reason for Consult: Depression Referring Physician: Dr. Erven Colla [name] [name] is an 31 y.o. male.  HPI: Patient was seen and chart reviewed. Patient has been compliant with his medication without adverse affects. Patient has been observed on the unit without irritability, agitation, aggressive behavior, suicidal behavior, gestures. Patient was known to this the facility from his previous the multiple visits. Patient was the known for manipulative behavior, and occasionally throwing tantrums. Patient continued to endorse heating his mom's voice in his head, which he want to be disappeared. Patient does not appear to be responded to internal stimuli. Patient has no paranoia or hallucinations. Patient has a fair insight, judgment and impulse control. Patient does not endorse suicidal ideation, intentions, or plans. He has no homicidal ideation, intentions, or plans. He was serve, supported by his sister and brother.  Past Medical History  Diagnosis Date  . Mental disorder   . Hypertension   . Depression   . Bipolar 1 disorder   . Irregular heart rate   . Asthma     History reviewed. No pertinent past surgical history.  Family History  Problem Relation Age of Onset  . Hypertension Mother   . Hypertension Other     Social History:  reports that he has quit smoking. He does not have any smokeless tobacco history on file. He reports that he drinks alcohol. He reports that he does not use illicit drugs.  Allergies: No Known Allergies  Medications: I have reviewed the patient's current medications.  No results found for this or any previous visit (from the past 48 hour(s)).  No results found.  Positive for anxiety, bad mood, bipolar, depression, sleep disturbance and Mother's death, 05/02/11. Blood pressure 120/77, pulse 87, temperature 98.3 F (36.8 C), temperature source Oral, resp. rate 18, SpO2 98.00%.   Assessment/Plan: Bereavement Bipolar disorder by  history  Patient does not meet criteria for acute psychiatric hospitalization. Patient will be referred to the outpatient psychiatric services with his current medication management. The she'll continue taking his medication Geodon 60 mg 2 pills at bedtime Paxil 60 mg daily, and Neurontin 300 mg 3 times daily.  Khelani Kops,[name]. 03/13/2012, 4:05 PM

## 2012-03-13 NOTE — [facility] Counselor (Signed)
TC with [facility] @ [facility]. Confirmed that pt is on the wait list and nothing is needed at this time.

## 2012-03-13 NOTE — ED Notes (Signed)
Pt was told that he was going home earlier by the psych team and pt was really upset about that and has been crying stating that he needed some help. Pt called staff to room and stated that we should take his covers out of his room because he was having thoughts of harming himself with his sheets. Sheets rolled up on bed and pt was sitting in the floor. Pt c/o chest pain rating it a 8-10 non radiating. VS 129/84, Sat 98 RA, HR 76. MD notified and NT to do EKG. Antianxiety med given. Will continue to monitor.

## 2012-03-13 NOTE — ED Provider Notes (Addendum)
Dr. Shela Commons recommends d/c.  [name] Munch, MD 03/13/12 2200  10:35 PM Prior to d/c the patient explicitly notes SI, and states that he is unsafe.  I discussed the case with nursing staff and ACT members.  They note similar occurences in the patient's past.  [name] Munch, MD 03/13/12 2236  He now endorses CP.   Date: 03/13/2012  Rate: 67  Rhythm: normal sinus rhythm  QRS Axis: normal  Intervals: normal  ST/T Wave abnormalities: normal  Conduction Disutrbances:none  Narrative Interpretation:   Old EKG Reviewed: none available NORMAL  He is being discharged.   [name] Munch, MD 03/13/12 2315

## 2012-03-19 ENCOUNTER — Emergency Department (HOSPITAL_COMMUNITY)
Admission: EM | Admit: 2012-03-19 | Discharge: 2012-03-20 | Disposition: A | Discharge status: Other Acute Inpatient Hospital | Payer: Self-pay | Attending: Emergency Medicine | Admitting: Emergency Medicine

## 2012-03-19 ENCOUNTER — Encounter (HOSPITAL_COMMUNITY): Payer: Self-pay | Admitting: *Deleted

## 2012-03-19 DIAGNOSIS — F319 Bipolar disorder, unspecified: Secondary | ICD-10-CM | POA: Insufficient documentation

## 2012-03-19 DIAGNOSIS — R112 Nausea with vomiting, unspecified: Secondary | ICD-10-CM | POA: Insufficient documentation

## 2012-03-19 DIAGNOSIS — I10 Essential (primary) hypertension: Secondary | ICD-10-CM | POA: Insufficient documentation

## 2012-03-19 DIAGNOSIS — R45851 Suicidal ideations: Secondary | ICD-10-CM | POA: Insufficient documentation

## 2012-03-19 LAB — URINALYSIS, ROUTINE W REFLEX MICROSCOPIC
Hgb urine dipstick: NEGATIVE
Leukocytes, UA: NEGATIVE
Nitrite: NEGATIVE
Specific Gravity, Urine: 1.014 (ref 1.005–1.030)
Urobilinogen, UA: 0.2 mg/dL (ref 0.0–1.0)

## 2012-03-19 LAB — COMPREHENSIVE METABOLIC PANEL
ALT: 24 U/L (ref 0–53)
Calcium: 9.1 mg/dL (ref 8.4–10.5)
GFR calc Af Amer: >90 mL/min (ref >90)
Glucose, Bld: 100 mg/dL — ABNORMAL HIGH (ref 70–99)
Sodium: 139 mEq/L (ref 135–145)
Total Protein: 6.7 g/dL (ref 6.0–8.3)

## 2012-03-19 LAB — RAPID URINE DRUG SCREEN, HOSP PERFORMED
Cocaine: NOT DETECTED
Opiates: NOT DETECTED

## 2012-03-19 LAB — CBC
Hemoglobin: 13.9 g/dL (ref 13.0–17.0)
MCH: 30.2 pg (ref 26.0–34.0)
MCHC: 34.1 g/dL (ref 30.0–36.0)

## 2012-03-19 LAB — ETHANOL: Alcohol, Ethyl (B): <11 mg/dL (ref 0–11)

## 2012-03-19 MED ORDER — ALBUTEROL SULFATE HFA 108 (90 BASE) MCG/ACT IN AERS: 2.0000 | INHALATION_SPRAY | Freq: Four times a day (QID) | RESPIRATORY_TRACT | Status: DC | PRN

## 2012-03-19 MED ORDER — LORAZEPAM 1 MG PO TABS
1.0000 mg | ORAL_TABLET | Freq: Three times a day (TID) | ORAL | Status: DC | PRN
  Administered 2012-03-19: 1 mg via ORAL
  Filled 2012-03-19: qty 1

## 2012-03-19 MED ORDER — QUETIAPINE FUMARATE 50 MG PO TABS
100.0000 mg | ORAL_TABLET | Freq: Every day | ORAL | Status: DC
  Administered 2012-03-19: 100 mg via ORAL
  Filled 2012-03-19: qty 2

## 2012-03-19 MED ORDER — PAROXETINE HCL 20 MG PO TABS
40.0000 mg | ORAL_TABLET | Freq: Every day | ORAL | Status: DC
  Administered 2012-03-19 – 2012-03-20 (×2): 40 mg via ORAL
  Filled 2012-03-19 (×4): qty 2

## 2012-03-19 MED ORDER — GABAPENTIN 300 MG PO CAPS
300.0000 mg | ORAL_CAPSULE | Freq: Three times a day (TID) | ORAL | Status: DC
  Administered 2012-03-19 – 2012-03-20 (×4): 300 mg via ORAL
  Filled 2012-03-19 (×3): qty 1

## 2012-03-19 MED ORDER — ALUM & MAG HYDROXIDE-SIMETH 200-200-20 MG/5ML PO SUSP: 30.0000 mL | ORAL | Status: DC | PRN

## 2012-03-19 MED ORDER — IBUPROFEN 200 MG PO TABS: 600.0000 mg | ORAL_TABLET | Freq: Three times a day (TID) | ORAL | Status: DC | PRN

## 2012-03-19 MED ORDER — ACETAMINOPHEN 325 MG PO TABS
650.0000 mg | ORAL_TABLET | Freq: Once | ORAL | Status: AC
  Administered 2012-03-19: 650 mg via ORAL
  Filled 2012-03-19: qty 2

## 2012-03-19 MED ORDER — ONDANSETRON HCL 8 MG PO TABS: 4.0000 mg | ORAL_TABLET | Freq: Three times a day (TID) | ORAL | Status: DC | PRN

## 2012-03-19 MED ORDER — ESCITALOPRAM OXALATE 10 MG PO TABS: 10.0000 mg | ORAL_TABLET | Freq: Every day | ORAL | Status: DC

## 2012-03-19 MED ORDER — ACETAMINOPHEN 325 MG PO TABS: 650.0000 mg | ORAL_TABLET | ORAL | Status: DC | PRN

## 2012-03-19 MED ORDER — POTASSIUM CHLORIDE CRYS ER 20 MEQ PO TBCR
20.0000 meq | EXTENDED_RELEASE_TABLET | Freq: Once | ORAL | Status: AC
  Administered 2012-03-19: 20 meq via ORAL
  Filled 2012-03-19: qty 1

## 2012-03-19 MED ORDER — ZIPRASIDONE HCL 60 MG PO CAPS
60.0000 mg | ORAL_CAPSULE | Freq: Every day | ORAL | Status: DC
  Administered 2012-03-19: 60 mg via ORAL
  Filled 2012-03-19: qty 1

## 2012-03-19 MED ORDER — ONDANSETRON 4 MG PO TBDP
4.0000 mg | ORAL_TABLET | Freq: Once | ORAL | Status: AC
  Administered 2012-03-19: 4 mg via ORAL
  Filled 2012-03-19: qty 1

## 2012-03-19 NOTE — ED Provider Notes (Signed)
History     CSN: [id number]  Arrival date & time 03/19/12  0055   First MD Initiated Contact with Patient 03/19/12 0241      Chief Complaint  Patient presents with  . Nausea  . Emesis    (Consider location/radiation/quality/duration/timing/severity/associated sxs/prior treatment) Patient is a 31 y.o. male presenting with vomiting.  Emesis  Pertinent negatives include no abdominal pain, no chills, no fever and no headaches.   Hx per PT. Drinking alcohol all weekend, has bipolar, hears voices and drinks ETOH to try to ignore them. Voices are laughter, and voices of his deceased mother. No SI/ HI. Last ETOH was 2 nights ago.  Was able to take home meds yesterday and scheduled to take meds in the am. No diarrhea, no blood in emesis.  Still hearing some voices. Last admit to [facility] a few months ago for SI.  Past Medical History  Diagnosis Date  . Mental disorder   . Hypertension   . Depression   . Bipolar 1 disorder   . Irregular heart rate   . Asthma     History reviewed. No pertinent past surgical history.  Family History  Problem Relation Age of Onset  . Hypertension Mother   . Hypertension Other     History  Substance Use Topics  . Smoking status: Former Smoker -- 0.5 packs/day  . Smokeless tobacco: Not on file  . Alcohol Use: 0.0 oz/week     Pt reports he drank a couple of 40s, a couple of shots of liqour, and brown and white on Sat      Review of Systems  Constitutional: Negative for fever and chills.  HENT: Negative for neck pain and neck stiffness.   Eyes: Negative for pain.  Respiratory: Negative for shortness of breath.   Cardiovascular: Negative for chest pain.  Gastrointestinal: Positive for vomiting. Negative for abdominal pain.  Genitourinary: Negative for dysuria.  Musculoskeletal: Negative for back pain.  Skin: Negative for rash.  Neurological: Negative for headaches.  All other systems reviewed and are negative.    Allergies  Review of  patient's allergies indicates no known allergies.  Home Medications   Current Outpatient Rx  Name Route Sig Dispense Refill  . ALBUTEROL SULFATE HFA 108 (90 BASE) MCG/ACT IN AERS Inhalation Inhale 2 puffs into the lungs every 6 (six) hours as needed. For breathing    . GABAPENTIN 300 MG PO CAPS Oral Take 300 mg by mouth 3 (three) times daily. For anxiety    . PAROXETINE HCL 40 MG PO TABS Oral Take 40 mg by mouth daily.    . QUETIAPINE FUMARATE 100 MG PO TABS Oral Take 100 mg by mouth at bedtime.    Marland Kitchen ZIPRASIDONE HCL 60 MG PO CAPS Oral Take 60 mg by mouth at bedtime.      BP 115/82  Pulse 86  Temp 98.7 F (37.1 C) (Oral)  Resp 20  SpO2 98%  Physical Exam  Constitutional: He is oriented to person, place, and time. He appears well-developed and well-nourished.  HENT:  Head: Normocephalic and atraumatic.  Eyes: Conjunctivae normal and EOM are normal. Pupils are equal, round, and reactive to light.  Neck: Trachea normal. Neck supple. No thyromegaly present.  Cardiovascular: Normal rate, regular rhythm, S1 normal, S2 normal and normal pulses.     No systolic murmur is present   No diastolic murmur is present  Pulses:      Radial pulses are 2+ on the right side, and 2+ on the  left side.  Pulmonary/Chest: Effort normal and breath sounds normal. He has no wheezes. He has no rhonchi. He has no rales. He exhibits no tenderness.  Abdominal: Soft. Normal appearance and bowel sounds are normal. There is no tenderness. There is no CVA tenderness and negative Murphy's sign.  Musculoskeletal:       BLE:s Calves nontender, no cords or erythema, negative Homans sign  Neurological: He is alert and oriented to person, place, and time. He has normal strength. No cranial nerve deficit or sensory deficit. GCS eye subscore is 4. GCS verbal subscore is 5. GCS motor subscore is 6.  Skin: Skin is warm and dry. No rash noted. He is not diaphoretic.  Psychiatric: His speech is normal.       Cooperative and  appropriate    ED Course  Procedures (including critical care time)  Results for orders placed during the hospital encounter of 03/19/12  CBC      Component Value Range   WBC 7.4  4.0 - 10.5 K/uL   RBC 4.60  4.22 - 5.81 MIL/uL   Hemoglobin 13.9  13.0 - 17.0 g/dL   HCT 84.6  96.2 - 95.2 %   MCV 88.7  78.0 - 100.0 fL   MCH 30.2  26.0 - 34.0 pg   MCHC 34.1  30.0 - 36.0 g/dL   RDW 84.1  32.4 - 40.1 %   Platelets 230  150 - 400 K/uL  COMPREHENSIVE METABOLIC PANEL      Component Value Range   Sodium 139  135 - 145 mEq/L   Potassium 3.6  3.5 - 5.1 mEq/L   Chloride 104  96 - 112 mEq/L   CO2 25  19 - 32 mEq/L   Glucose, Bld 100 (*) 70 - 99 mg/dL   BUN 18  6 - 23 mg/dL   Creatinine, Ser 0.27  0.50 - 1.35 mg/dL   Calcium 9.1  8.4 - 25.3 mg/dL   Total Protein 6.7  6.0 - 8.3 g/dL   Albumin 3.9  3.5 - 5.2 g/dL   AST 20  0 - 37 U/L   ALT 24  0 - 53 U/L   Alkaline Phosphatase 116  39 - 117 U/L   Total Bilirubin 0.5  0.3 - 1.2 mg/dL   GFR calc non Af Amer >90  >90 mL/min   GFR calc Af Amer >90  >90 mL/min  ETHANOL      Component Value Range   Alcohol, Ethyl (B) <11  0 - 11 mg/dL  URINALYSIS, ROUTINE W REFLEX MICROSCOPIC      Component Value Range   Color, Urine YELLOW  YELLOW   APPearance CLEAR  CLEAR   Specific Gravity, Urine 1.014  1.005 - 1.030   pH 6.0  5.0 - 8.0   Glucose, UA NEGATIVE  NEGATIVE mg/dL   Hgb urine dipstick NEGATIVE  NEGATIVE   Bilirubin Urine NEGATIVE  NEGATIVE   Ketones, ur NEGATIVE  NEGATIVE mg/dL   Protein, ur NEGATIVE  NEGATIVE mg/dL   Urobilinogen, UA 0.2  0.0 - 1.0 mg/dL   Nitrite NEGATIVE  NEGATIVE   Leukocytes, UA NEGATIVE  NEGATIVE    Zofran. PO fluids. No active vomiting in the ED. Labs reviewed  5:51 AM now is hearing voices and feeling suicidal. ACT evaluated plan PSYCH admit.  PSYCH holding orders placed. Home meds continued   MDM   SI h/o bipolar. Plan PSYCH admit/ dispo        Sunnie Nielsen, MD 03/19/12  0749 

## 2012-03-19 NOTE — ED Provider Notes (Signed)
Pt alert, content, vitals normal, nad. telepsych recommends inpt psych, placement pending, discussed w act team.   Suzi Roots, MD 03/19/12 1154

## 2012-03-19 NOTE — BH Assessment (Signed)
Assessment Note   [name] [name] is an 31 y.o. male.  Pt reports that he is hearing voices for the last couple of weeks which are telling him to kill himself and others.  Patient feels regretful for drinking so much ETOH on Saturday evening (10/12) but says he did it to drown out the voices he is hearing.  He is sometimes seeing his mother who passed away in 04-23-2011.  Patient has previous history of 2 suicide attempts.  Patient has SI but no current intention or plan.  No HI at this time.  He receives medication monitoring from [employer].  At this time he does not feel safe going back to where he stays (with sister and brother-in-law).  Patient will be referred to [facility] and other facilities. 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 Mental disorder   . Hypertension   . Depression   . Bipolar 1 disorder   . Irregular heart rate   . Asthma    Axis IV: economic problems, occupational problems, other psychosocial or environmental problems, problems related to social environment and problems with primary support group Axis V: 31-40 impairment in reality testing  Past Medical History:  Past Medical History  Diagnosis Date  . Mental disorder   . Hypertension   . Depression   . Bipolar 1 disorder   . Irregular heart rate   . Asthma     History reviewed. No pertinent past surgical history.  Family History:  Family History  Problem Relation Age of Onset  . Hypertension Mother   . Hypertension Other     Social History:  reports that he has quit smoking. He does not have any smokeless tobacco history on file. He reports that he drinks alcohol. He reports that he does not use illicit drugs.  Additional Social History:  Alcohol / Drug Use Pain Medications: None Prescriptions: See medication reconcilliation Over the Counter: see medication reconcilliation History of alcohol /  drug use?: Yes Negative Consequences of Use: Personal relationships Substance #1 Name of Substance 1: ETOH 1 - Age of First Use: 31 years of age 38 - Amount (size/oz): 5-6 beers at a time 1 - Frequency: <1x/M 1 - Duration: Drank a lot on 10/12 1 - Last Use / Amount: Drank on Saturday night (10/12))   CIWA: CIWA-Ar BP: 124/81 mmHg Pulse Rate: 70  COWS:    Allergies: No Known Allergies  Home Medications:  (Not in a hospital admission)  OB/GYN Status:  No LMP for male patient.  General Assessment Data Location of Assessment: [facility] ED Living Arrangements: Other relatives Can pt return to current living arrangement?: No Admission Status: Voluntary Is patient capable of signing voluntary admission?: Yes Transfer from: Acute Hospital Referral Source: Self/Family/Friend     Risk to self Suicidal Ideation: Yes-Currently Present Suicidal Intent: No Is patient at risk for suicide?: Yes Suicidal Plan?: No Specify Current Suicidal Plan: None Access to Means: No Specify Access to Suicidal Means: N/A What has been your use of drugs/alcohol within the last 12 months?: ETOH consumption on 10/12 Previous Attempts/Gestures: Yes How many times?: 2  Other Self Harm Risks: None identified Triggers for Past Attempts: Anniversary;Unpredictable;Other (Comment) (Anniversary of mother's death is Oct 21, 2010) Intentional Self Injurious Behavior: None Comment - Self Injurious Behavior: None Family Suicide History: No Recent stressful life event(s): Loss (Comment);Conflict (Comment) Persecutory voices/beliefs?: Yes Depression: Yes Depression Symptoms: Despondent;Isolating;Loss  of interest in usual pleasures;Feeling worthless/self pity Substance abuse history and/or treatment for substance abuse?: No Suicide prevention information given to non-admitted patients: Not applicable  Risk to Others Homicidal Ideation: No Thoughts of Harm to Others: No Current Homicidal Intent: No Current Homicidal Plan:  No Access to Homicidal Means: No Identified Victim: No one History of harm to others?: No Assessment of Violence: None Noted Violent Behavior Description: Calm and cooperative Does patient have access to weapons?: No Criminal Charges Pending?: No Does patient have a court date: No  Psychosis Hallucinations: Visual;Auditory;With command (Sees his dead mother.  Also has voices telling him to harm s) Delusions: None noted  Mental Status Report Appear/Hygiene: Disheveled Eye Contact: Fair Motor Activity: Freedom of movement;Unremarkable Speech: Logical/coherent Level of Consciousness: Alert;Quiet/awake Mood: Depressed;Sad Affect: Depressed;Sad Anxiety Level: Minimal Thought Processes: Coherent;Relevant Judgement: Impaired Orientation: Person;Place;Time;Situation Obsessive Compulsive Thoughts/Behaviors: None  Cognitive Functioning Concentration: Decreased Memory: Recent Intact;Remote Intact IQ: Average Insight: Poor Impulse Control: Poor Appetite: Fair Weight Loss: 0  Weight Gain: 0  Sleep: Decreased Total Hours of Sleep:  (Pt reports not sleeping in last 2 days) Vegetative Symptoms: None  ADLScreening [facility] Assessment Services) Patient's cognitive ability adequate to safely complete daily activities?: Yes Patient able to express need for assistance with ADLs?: Yes Independently performs ADLs?: Yes (appropriate for developmental age)  Abuse/Neglect [facility]) Physical Abuse: Denies Verbal Abuse: Denies Sexual Abuse: Denies  Prior Inpatient Therapy Prior Inpatient Therapy: Yes Prior Therapy Dates: 2012 Prior Therapy Facilty/Provider(s): [facility], [facility] Reason for Treatment: Stabilization  Prior Outpatient Therapy Prior Outpatient Therapy: Yes Prior Therapy Dates: Current Prior Therapy Facilty/Provider(s): [facility] Reason for Treatment: Med Management  ADL Screening (condition at time of admission) Patient's cognitive ability adequate to safely complete daily  activities?: Yes Patient able to express need for assistance with ADLs?: Yes Independently performs ADLs?: Yes (appropriate for developmental age) Weakness of Legs: None Weakness of Arms/Hands: None  Home Assistive Devices/Equipment Home Assistive Devices/Equipment: None    Abuse/Neglect Assessment (Assessment to be complete while patient is alone) Physical Abuse: Denies Verbal Abuse: Denies Sexual Abuse: Denies Exploitation of patient/patient's resources: Denies Self-Neglect: Denies Values / Beliefs Cultural Requests During Hospitalization: None Spiritual Requests During Hospitalization: None   Advance Directives (For Healthcare) Advance Directive: Patient does not have advance directive;Patient would not like information    Additional Information 1:1 In Past 12 Months?: Yes CIRT Risk: No Elopement Risk: No Does patient have medical clearance?: Yes     Disposition:  Disposition Disposition of Patient: Inpatient treatment program Type of inpatient treatment program: Adult Type of outpatient treatment: Adult Patient referred to:  [facility], [facility])  On Site Evaluation by:   Reviewed with Physician:  Dr. Fransico Michael Ray 03/19/2012 6:54 AM

## 2012-03-19 NOTE — ED Notes (Signed)
[name] [name] with ACT team at bedside.  

## 2012-03-19 NOTE — ED Notes (Signed)
Per EMS pt states he has had N/V and fever since Saturday.

## 2012-03-20 NOTE — BH Assessment (Signed)
Assessment Note   [name] [name] is an 30 y.o. male that is being reassessed for placement.  Pt has been denied at [facility] and other facilities are full.  Pt continues to endorse SI and A/V command hallucinations "that come and go."  Pt has minimal support and is currently "couch-surfing" until he can get stable, at which time he reports that his sister is willing to allow him to return to her home.  Pt admits that he self-medicates with ETOH to control the voices, but states that he does take his medications as prescribed.  Pt states that [facility] recently changed his medications, but that he is taking them "like they told me to."  Pt continues to state that he is unable to contract for safety and needs inpatient care to meet his mental health needs.  Writer contacted [facility] and obtained his authorization number and has contacted [facility] with his referral information at 1000.  Awaiting return call to be placed on the [facility] waiting list.  Axis I: Major Depression, Recurrent severe and ETOH Abuse Axis II: and r/o Antisocial Personality Disorder Axis III:  Past Medical History  Diagnosis Date  . Mental disorder   . Hypertension   . Depression   . Bipolar 1 disorder   . Irregular heart rate   . Asthma    Axis IV: housing problems, other psychosocial or environmental problems, problems related to social environment, problems with access to health care services and problems with primary support group Axis V: 31-40 impairment in reality testing  Past Medical History:  Past Medical History  Diagnosis Date  . Mental disorder   . Hypertension   . Depression   . Bipolar 1 disorder   . Irregular heart rate   . Asthma     History reviewed. No pertinent past surgical history.  Family History:  Family History  Problem Relation Age of Onset  . Hypertension Mother   . Hypertension Other     Social History:  reports that he has quit smoking. He does not have any smokeless tobacco history on file.  He reports that he drinks alcohol. He reports that he does not use illicit drugs.  Additional Social History:  Alcohol / Drug Use Pain Medications: None Prescriptions: See medication reconcilliation Over the Counter: see medication reconcilliation History of alcohol / drug use?: Yes Negative Consequences of Use: Personal relationships Substance #1 Name of Substance 1: ETOH 1 - Age of First Use: 31 years of age 39 - Amount (size/oz): 5-6 beers at a time 1 - Frequency: <1x/M 1 - Duration: Drank a lot on 10/12 1 - Last Use / Amount: Drank on Saturday night (10/12))   CIWA: CIWA-Ar BP: 92/59 mmHg Pulse Rate: 70  Nausea and Vomiting: no nausea and no vomiting Tactile Disturbances: none Tremor: no tremor Auditory Disturbances: not present Paroxysmal Sweats: no sweat visible Visual Disturbances: not present Anxiety: no anxiety, at ease Headache, Fullness in Head: none present Agitation: normal activity Orientation and Clouding of Sensorium: oriented and can do serial additions CIWA-Ar Total: 0  COWS:    Allergies: No Known Allergies  Home Medications:  (Not in a hospital admission)  OB/GYN Status:  No LMP for male patient.  General Assessment Data Location of Assessment: [facility] ED Living Arrangements: Non-relatives/Friends (couch-surfing until he gets "stable,"then able to live w/Sis) Can pt return to current living arrangement?: Yes Admission Status: Involuntary Is patient capable of signing voluntary admission?: Yes Transfer from: Acute Hospital Referral Source: Self/Family/Friend  Education Status  Is patient currently in school?: No  Risk to self Suicidal Ideation: Yes-Currently Present Suicidal Intent: No Is patient at risk for suicide?: Yes Suicidal Plan?: No Specify Current Suicidal Plan: "voices tell me to do it." Access to Means: No Specify Access to Suicidal Means: sharps and pills available when not in ED What has been your use of drugs/alcohol within the last  12 months?: ETOH as a means of self-medicating Previous Attempts/Gestures: Yes How many times?: 2  Other Self Harm Risks: none noted Triggers for Past Attempts: Anniversary;Unpredictable;Other (Comment) Intentional Self Injurious Behavior: None Comment - Self Injurious Behavior: none noted Family Suicide History: No Recent stressful life event(s): Loss (Comment);Conflict (Comment) Persecutory voices/beliefs?: Yes Depression: Yes Depression Symptoms: Loss of interest in usual pleasures;Feeling worthless/self pity Substance abuse history and/or treatment for substance abuse?: No Suicide prevention information given to non-admitted patients: Not applicable  Risk to Others Homicidal Ideation: No (but has heard voices telling him to harm self) Thoughts of Harm to Others: No Current Homicidal Intent: No Current Homicidal Plan: No Access to Homicidal Means: No Identified Victim: no one History of harm to others?: No Assessment of Violence: None Noted Violent Behavior Description: resting quietly Does patient have access to weapons?: No Criminal Charges Pending?: No Does patient have a court date: No  Psychosis Hallucinations: Auditory;Visual;With command Delusions: None noted  Mental Status Report Appear/Hygiene: Improved Eye Contact: Fair Motor Activity: Unremarkable Speech: Logical/coherent Level of Consciousness: Quiet/awake Mood: Ambivalent;Apathetic;Helpless Affect: Apathetic;Appropriate to circumstance Anxiety Level: Minimal Thought Processes: Relevant Judgement: Impaired Orientation: Person;Place;Time;Situation Obsessive Compulsive Thoughts/Behaviors: Minimal  Cognitive Functioning Concentration: Decreased Memory: Recent Intact;Remote Intact IQ: Average Insight: Poor Impulse Control: Poor Appetite: Good Weight Loss: 0  Weight Gain: 0  Sleep: Increased Total Hours of Sleep:  (initially reports a decrease. Has been sleeping this shift) Vegetative Symptoms:  None  ADLScreening [facility] Assessment Services) Patient's cognitive ability adequate to safely complete daily activities?: Yes Patient able to express need for assistance with ADLs?: Yes Independently performs ADLs?: Yes (appropriate for developmental age)  Abuse/Neglect [facility]) Physical Abuse: Denies Verbal Abuse: Denies Sexual Abuse: Denies  Prior Inpatient Therapy Prior Inpatient Therapy: Yes Prior Therapy Dates: 2012 Prior Therapy Facilty/Provider(s): [facility], [facility] Reason for Treatment: Stabilization  Prior Outpatient Therapy Prior Outpatient Therapy: Yes Prior Therapy Dates: Current Prior Therapy Facilty/Provider(s): [facility] Reason for Treatment: Med Management  ADL Screening (condition at time of admission) Patient's cognitive ability adequate to safely complete daily activities?: Yes Patient able to express need for assistance with ADLs?: Yes Independently performs ADLs?: Yes (appropriate for developmental age) Weakness of Legs: None Weakness of Arms/Hands: None  Home Assistive Devices/Equipment Home Assistive Devices/Equipment: None    Abuse/Neglect Assessment (Assessment to be complete while patient is alone) Physical Abuse: Denies Verbal Abuse: Denies Sexual Abuse: Denies Exploitation of patient/patient's resources: Denies Self-Neglect: Denies Values / Beliefs Cultural Requests During Hospitalization: None Spiritual Requests During Hospitalization: None   Advance Directives (For Healthcare) Advance Directive: Patient does not have advance directive;Patient would not like information    Additional Information 1:1 In Past 12 Months?: Yes CIRT Risk: No Elopement Risk: No Does patient have medical clearance?: Yes     Disposition: Referred to [facility]. Disposition Disposition of Patient: Referred to Type of inpatient treatment program: Adult Type of outpatient treatment: Adult Patient referred to: [facility]  On Site Evaluation by:   Reviewed with  Physician:     Angelica Ran 03/20/2012 9:47 AM

## 2012-03-31 ENCOUNTER — Emergency Department (HOSPITAL_COMMUNITY)
Admission: EM | Admit: 2012-03-31 | Discharge: 2012-03-31 | Disposition: A | Discharge status: Home / Self Care | Payer: Self-pay | Attending: Emergency Medicine | Admitting: Emergency Medicine

## 2012-03-31 ENCOUNTER — Encounter (HOSPITAL_COMMUNITY): Payer: Self-pay

## 2012-03-31 DIAGNOSIS — F1020 Alcohol dependence, uncomplicated: Secondary | ICD-10-CM | POA: Insufficient documentation

## 2012-03-31 DIAGNOSIS — F489 Nonpsychotic mental disorder, unspecified: Secondary | ICD-10-CM | POA: Insufficient documentation

## 2012-03-31 DIAGNOSIS — Z79899 Other long term (current) drug therapy: Secondary | ICD-10-CM | POA: Insufficient documentation

## 2012-03-31 DIAGNOSIS — R0789 Other chest pain: Secondary | ICD-10-CM

## 2012-03-31 DIAGNOSIS — R5383 Other fatigue: Secondary | ICD-10-CM | POA: Insufficient documentation

## 2012-03-31 DIAGNOSIS — R5381 Other malaise: Secondary | ICD-10-CM | POA: Insufficient documentation

## 2012-03-31 DIAGNOSIS — I10 Essential (primary) hypertension: Secondary | ICD-10-CM | POA: Insufficient documentation

## 2012-03-31 DIAGNOSIS — R071 Chest pain on breathing: Secondary | ICD-10-CM | POA: Insufficient documentation

## 2012-03-31 DIAGNOSIS — R0602 Shortness of breath: Secondary | ICD-10-CM | POA: Insufficient documentation

## 2012-03-31 DIAGNOSIS — F309 Manic episode, unspecified: Secondary | ICD-10-CM | POA: Insufficient documentation

## 2012-03-31 DIAGNOSIS — Z87891 Personal history of nicotine dependence: Secondary | ICD-10-CM | POA: Insufficient documentation

## 2012-03-31 DIAGNOSIS — M25519 Pain in unspecified shoulder: Secondary | ICD-10-CM | POA: Insufficient documentation

## 2012-03-31 DIAGNOSIS — J45909 Unspecified asthma, uncomplicated: Secondary | ICD-10-CM | POA: Insufficient documentation

## 2012-03-31 DIAGNOSIS — I499 Cardiac arrhythmia, unspecified: Secondary | ICD-10-CM | POA: Insufficient documentation

## 2012-03-31 DIAGNOSIS — R110 Nausea: Secondary | ICD-10-CM | POA: Insufficient documentation

## 2012-03-31 LAB — CBC
MCH: 29.9 pg (ref 26.0–34.0)
MCHC: 33.4 g/dL (ref 30.0–36.0)
Platelets: 244 10*3/uL (ref 150–400)
RDW: 13.2 % (ref 11.5–15.5)

## 2012-03-31 LAB — COMPREHENSIVE METABOLIC PANEL
ALT: 52 U/L (ref 0–53)
AST: 32 U/L (ref 0–37)
Calcium: 9 mg/dL (ref 8.4–10.5)
GFR calc Af Amer: >90 mL/min (ref >90)
Glucose, Bld: 87 mg/dL (ref 70–99)
Sodium: 140 mEq/L (ref 135–145)
Total Protein: 6.3 g/dL (ref 6.0–8.3)

## 2012-03-31 MED ORDER — QUETIAPINE FUMARATE 200 MG PO TABS: 400.0000 mg | ORAL_TABLET | Freq: Every day | ORAL | Status: DC

## 2012-03-31 MED ORDER — SODIUM CHLORIDE 0.9 % IV BOLUS (SEPSIS)
1000.0000 mL | Freq: Once | INTRAVENOUS | Status: AC
  Administered 2012-03-31: 1000 mL via INTRAVENOUS

## 2012-03-31 MED ORDER — GABAPENTIN 300 MG PO CAPS
300.0000 mg | ORAL_CAPSULE | Freq: Three times a day (TID) | ORAL | Status: DC
  Administered 2012-03-31: 300 mg via ORAL
  Filled 2012-03-31: qty 1

## 2012-03-31 MED ORDER — ALBUTEROL SULFATE HFA 108 (90 BASE) MCG/ACT IN AERS
2.0000 | INHALATION_SPRAY | Freq: Four times a day (QID) | RESPIRATORY_TRACT | Status: DC | PRN
Start: 2012-03-31 — End: 2012-03-31

## 2012-03-31 MED ORDER — PAROXETINE HCL 20 MG PO TABS
40.0000 mg | ORAL_TABLET | Freq: Every day | ORAL | Status: DC
  Administered 2012-03-31: 40 mg via ORAL
  Filled 2012-03-31: qty 2

## 2012-03-31 MED ORDER — ONDANSETRON HCL 4 MG/2ML IJ SOLN
4.0000 mg | Freq: Once | INTRAMUSCULAR | Status: AC
  Administered 2012-03-31: 4 mg via INTRAVENOUS
  Filled 2012-03-31: qty 2

## 2012-03-31 MED ORDER — ZIPRASIDONE HCL 60 MG PO CAPS
60.0000 mg | ORAL_CAPSULE | Freq: Every day | ORAL | Status: DC
  Filled 2012-03-31: qty 1

## 2012-03-31 MED ORDER — CLONAZEPAM 0.5 MG PO TABS: 0.5000 mg | ORAL_TABLET | Freq: Three times a day (TID) | ORAL | Status: DC | PRN

## 2012-03-31 MED ORDER — IBUPROFEN 200 MG PO TABS
400.0000 mg | ORAL_TABLET | Freq: Four times a day (QID) | ORAL | Status: DC | PRN
  Administered 2012-03-31: 400 mg via ORAL
  Filled 2012-03-31: qty 2

## 2012-03-31 NOTE — ED Notes (Signed)
ACT Suzette Battiest) called and will be in to see patient this morning.

## 2012-03-31 NOTE — ED Notes (Signed)
Pt. Reports left shoulder pain/pressure that started after work this evening, denies radiation.  Reports he took his nighttime medication and then drank 4 beers when he began to feel dizzy and nauseated. States "I really want help to stop drinking". Pt tearful. Denies SI/HI.

## 2012-03-31 NOTE — BH Assessment (Signed)
Assessment Note   [name] [name] is an 31 y.o. male. Pt presented to ED complaining of medical issues.  Pt. Then informed ED Physician that he had been drinking while taking medication and wanted Detox.  Pt. BAL was <11.  Pt. Reported drinking about 2 to 3 beers and shots after a stressful day at work.  Pt. Reports he had taken prescribed medication for MI during day and he became nauseous, tremors, sweating, unsteady and anxious.  Pt. Reports calling EMS and being transported to hospital ER.  Pt. Is A/O x 3 and has insight into what his poor choices were, staff explained to pt. He did not meet criteria for Detox and provided pt. With resources for AA, Residential Treatment at [facility] and [facility].  Staff informed pt. Due to lack of insurance it may be difficult for pt. To get inpatient.  Pt. Reported he has f/u appt. At [facility] on Tuesday, as he was just d/c from [facility] on Oct. 25, 13.  Staff informed pt. To discuss his episode with staff at [facility] on Tuesday and begin therapy to address anxiety/panic attacks.  Pt. Reports "he is stable" .  Denies any SI/HI/AVH.  Pt. Reports some depression, but not as severe as in the past.  Pt. Feels recent inpatient was helpful and reports "I know I cannot drink on my meds, but I was hanging around the wrong crowd".  Pt. Reports he no longer wishes to drink alcohol.  Staff and pt. Discussed danger of mixing alcohol with current MH medication.  Pt. D/C by ED Physician.    Axis I: Bipolar, mixed Axis II: none Axis III:  See Below Axis IV:  Stress with work environment, social Axis V:  65  Past Medical History:  Past Medical History  Diagnosis Date  . Mental disorder   . Hypertension   . Depression   . Bipolar 1 disorder   . Irregular heart rate   . Asthma     History reviewed. No pertinent past surgical history.  Family History:  Family History  Problem Relation Age of Onset  . Hypertension Mother   . Hypertension Other     Social  History:  reports that he has quit smoking. He does not have any smokeless tobacco history on file. He reports that he drinks alcohol. He reports that he does not use illicit drugs.  Additional Social History:     CIWA: CIWA-Ar BP: 141/94 mmHg Pulse Rate: 93  Nausea and Vomiting: no nausea and no vomiting Tactile Disturbances: none Tremor: no tremor Auditory Disturbances: not present Paroxysmal Sweats: no sweat visible Visual Disturbances: not present Anxiety: no anxiety, at ease Headache, Fullness in Head: none present Agitation: normal activity Orientation and Clouding of Sensorium: oriented and can do serial additions CIWA-Ar Total: 0  COWS:    Allergies: No Known Allergies  Home Medications:  (Not in a hospital admission)  OB/GYN Status:  No LMP for male patient.  General Assessment Data Location of Assessment: [facility] ED ACT Assessment: Yes Living Arrangements: Non-relatives/Friends Can pt return to current living arrangement?: No Admission Status: Voluntary Is patient capable of signing voluntary admission?: Yes Transfer from: Home Referral Source: Self/Family/Friend  Education Status Is patient currently in school?: No  Risk to self Suicidal Ideation: No Suicidal Intent: No Is patient at risk for suicide?: No Suicidal Plan?: No Specify Current Suicidal Plan: denies Access to Means: No Specify Access to Suicidal Means: none What has been your use of drugs/alcohol within the  last 12 months?: last night liquor and wine Previous Attempts/Gestures: Yes How many times?: 2  Other Self Harm Risks: drinking while taking prescriptions meds Triggers for Past Attempts: Anniversary;Unpredictable;Other (Comment) Intentional Self Injurious Behavior: None Comment - Self Injurious Behavior: taking meds and drinking Family Suicide History: No Recent stressful life event(s): Job Loss Persecutory voices/beliefs?: No Depression: Yes Depression Symptoms:  Despondent;Guilt Substance abuse history and/or treatment for substance abuse?: Yes Suicide prevention information given to non-admitted patients: Not applicable  Risk to Others Homicidal Ideation: No Thoughts of Harm to Others: No Current Homicidal Intent: No Current Homicidal Plan: No Access to Homicidal Means: No Identified Victim: denies History of harm to others?: No Assessment of Violence: None Noted Violent Behavior Description: calm Does patient have access to weapons?: No Criminal Charges Pending?: No Does patient have a court date: No  Psychosis Hallucinations: None noted Delusions: None noted  Mental Status Report Appear/Hygiene: Improved Eye Contact: Good Motor Activity: Gait exaggerated Speech: Logical/coherent Level of Consciousness: Quiet/awake Mood: Anxious Affect: Appropriate to circumstance Anxiety Level: Minimal Thought Processes: Relevant;Coherent Judgement: Unimpaired Orientation: Person;Place;Time;Situation Obsessive Compulsive Thoughts/Behaviors: None  Cognitive Functioning Concentration: Normal Memory: Recent Intact IQ: Average Insight: Good Impulse Control: Fair Appetite: Fair Weight Loss: 0  Weight Gain: 0  Sleep: No Change Total Hours of Sleep: 0  Vegetative Symptoms: None  ADLScreening [facility] Assessment Services) Patient's cognitive ability adequate to safely complete daily activities?: Yes Patient able to express need for assistance with ADLs?: Yes Independently performs ADLs?: Yes (appropriate for developmental age)  Abuse/Neglect [facility]) Physical Abuse: Denies Verbal Abuse: Denies Sexual Abuse: Denies  Prior Inpatient Therapy Prior Inpatient Therapy: Yes Prior Therapy Dates: 03/2012 Prior Therapy Facilty[facility]Provider([facility] Reason for Treatment: Stabilization  Prior Outpatient Therapy Prior Outpatient Therapy: Yes Prior Therapy Dates: Current Prior Therapy Facilty/Provider(s): [facility] Reason for  Treatment: Med Management  ADL Screening (condition at time of admission) Patient's cognitive ability adequate to safely complete daily activities?: Yes Patient able to express need for assistance with ADLs?: Yes Independently performs ADLs?: Yes (appropriate for developmental age)       Abuse/Neglect Assessment (Assessment to be complete while patient is alone) Physical Abuse: Denies Verbal Abuse: Denies Sexual Abuse: Denies Values / Beliefs Cultural Requests During Hospitalization: None Spiritual Requests During Hospitalization: None        Additional Information 1:1 In Past 12 Months?: Yes CIRT Risk: No Elopement Risk: No Does patient have medical clearance?: Yes     Disposition: Pt to follow up at [facility] on Tuesday, Oct. 29 for MH.  Pt. To contact [facility] and [facility] for possible residential placement.  Pt. To follow up with AA and MI Support Groups.  Pt. Provided list of resources.   Disposition Disposition of Patient: Outpatient treatment Patient referred to: Outpatient clinic referral ([facility], [facility], AA Support)  On Site Evaluation by:   Reviewed with Physician:     Barbaraann Boys 03/31/2012 1:38 PM

## 2012-03-31 NOTE — ED Provider Notes (Signed)
History     CSN: [id number]  Arrival date & time 03/31/12  0446   First MD Initiated Contact with Patient 03/31/12 [phone]      Chief Complaint  Patient presents with  . Dizziness  . Shoulder Pain    (Consider location/radiation/quality/duration/timing/severity/associated sxs/prior treatment) HPI Comments: Patient presents with two complaints:  Patient reports left upper chest wall and left anterior shoulder pain that began yesterday while carrying a couch up and down the stairs with the couch's weight against him.  Pain is constant, worse with palpation and moving shoulder around.    Patient also reports he drank 4 beers last night and became dizzy and weak and nauseated. (4 beers between 12am and 2am, earlier in the day had a 40oz beer and several shots of liquor).  States that recently he has been drinking the same amount of beer and liquor daily and feels he is unable to control this.  Has been doing this x 16 years.  Requests help with detox.  States he may be mildly depressed.  Denies SI, HI.  States he did have a typical panic attack this morning with associated CP and SOB, states this was exactly like his prior panic attacks and it has completely resolved.  States he had a very stressful day yesterday and in combination with the alcohol made him very anxious.    Denies fever, SOB, abdominal pain, vomiting, urinary symptoms.  Pt has had diarrhea x 10 yesterday, nonbloody.    Patient is a 31 y.o. male presenting with shoulder pain. The history is provided by the patient.  Shoulder Pain Associated symptoms include chest pain and nausea. Pertinent negatives include no abdominal pain, fever or vomiting.    Past Medical History  Diagnosis Date  . Mental disorder   . Hypertension   . Depression   . Bipolar 1 disorder   . Irregular heart rate   . Asthma     History reviewed. No pertinent past surgical history.  Family History  Problem Relation Age of Onset  . Hypertension  Mother   . Hypertension Other     History  Substance Use Topics  . Smoking status: Former Smoker -- 0.5 packs/day  . Smokeless tobacco: Not on file  . Alcohol Use: 0.0 oz/week     Pt reports he drank a couple of 40s, a couple of shots of liqour, and brown and white on Sat      Review of Systems  Constitutional: Negative for fever.  Respiratory: Negative for shortness of breath.   Cardiovascular: Positive for chest pain.  Gastrointestinal: Positive for nausea. Negative for vomiting, abdominal pain and diarrhea.  Genitourinary: Negative for dysuria, urgency and frequency.  Neurological: Positive for dizziness.  Psychiatric/Behavioral: Negative for suicidal ideas.    Allergies  Review of patient's allergies indicates no known allergies.  Home Medications   Current Outpatient Rx  Name Route Sig Dispense Refill  . ALBUTEROL SULFATE HFA 108 (90 BASE) MCG/ACT IN AERS Inhalation Inhale 2 puffs into the lungs every 6 (six) hours as needed. For breathing    . CLONAZEPAM 0.5 MG PO TABS Oral Take 0.5 mg by mouth 3 (three) times daily.    Marland Kitchen GABAPENTIN 300 MG PO CAPS Oral Take 300 mg by mouth 3 (three) times daily. For anxiety    . IBUPROFEN 400 MG PO TABS Oral Take 400 mg by mouth every 6 (six) hours as needed. For pain    . PAROXETINE HCL 40 MG PO TABS Oral Take  40 mg by mouth daily.    . QUETIAPINE FUMARATE 400 MG PO TABS Oral Take 400 mg by mouth at bedtime.    Marland Kitchen ZIPRASIDONE HCL 60 MG PO CAPS Oral Take 60 mg by mouth at bedtime.      BP 115/64  Pulse 90  Temp 98.2 F (36.8 C) (Oral)  Resp 18  SpO2 98%  Physical Exam  Nursing note and vitals reviewed. Constitutional: He appears well-developed and well-nourished. No distress.  HENT:  Head: Normocephalic and atraumatic.  Neck: Neck supple.  Cardiovascular: Normal rate and regular rhythm.   Pulmonary/Chest: Effort normal and breath sounds normal. No respiratory distress. He has no wheezes. He has no rales. He exhibits  tenderness.         Palpation of chest wall and anterior L shoulder reproduces pain.   Abdominal: Soft. He exhibits no distension and no mass. There is no tenderness. There is no rebound and no guarding.  Neurological: He is alert. He exhibits normal muscle tone.  Skin: He is not diaphoretic.    ED Course  Procedures (including critical care time)   Labs Reviewed  ETHANOL  CBC  COMPREHENSIVE METABOLIC PANEL  DRUG SCREEN PANEL (SERUM)   No results found.  8:24 AM Patient reports he is feeling better after IVF, zofran.  No longer lightheaded or nauseated.  ETOH < 11.  States he is still interested in detox.    9:12 AM I spoke with Suzette Battiest, ACT team, who will see and assess patient.    1. Alcoholism   2. Chest wall tenderness     MDM  Pt with chest wall and shoulder soreness following carrying large couch twice, pressed up against his chest.  Pain is reproducible with palpation.  Pt also requesting help with detox from alcohol.  Has been drinking daily x 16 years.  Pt seen by ACT with resources given for outpatient follow up.  Discharged by Dr Weldon Inches.          [place], [place] 03/31/12 1523

## 2012-03-31 NOTE — ED Notes (Signed)
Per EMS, pt c/o upper chest/ left shoulder pain increased with palpitation. Hx of anxiety. Pt. Reported anxiety attack prior to EMS arrival.

## 2012-03-31 NOTE — ED Provider Notes (Signed)
Medical screening examination/treatment/procedure(s) were performed by non-physician practitioner and as supervising physician I was immediately available for consultation/collaboration.   Gavin Pound. Oletta Lamas, MD 03/31/12 2019

## 2012-03-31 NOTE — ED Notes (Addendum)
First meeting with patient. Patient states he is here for treatment for alcohol abuse. Patient states he he talked with doctors while at [facility] about treatment for alcohol abuse and was referred here.Patient contacted [facility] in [place] and they sent him a rejection letter due to his medication that he takes each day. Patient states his last drink was beer x 4 and 2 shots at  approx 0100 this morning. Patient denies any SI or HI and states that he is doing well on his psych meds with no hallucinations. Patient dressing in blue paper scrubs and security to wand patient.

## 2012-04-07 ENCOUNTER — Emergency Department (HOSPITAL_COMMUNITY)
Admission: EM | Admit: 2012-04-07 | Discharge: 2012-04-08 | Disposition: A | Discharge status: Other Acute Inpatient Hospital | Payer: Self-pay | Attending: Emergency Medicine | Admitting: Emergency Medicine

## 2012-04-07 ENCOUNTER — Emergency Department (HOSPITAL_COMMUNITY): Payer: Self-pay

## 2012-04-07 ENCOUNTER — Encounter (HOSPITAL_COMMUNITY): Payer: Self-pay | Admitting: Emergency Medicine

## 2012-04-07 DIAGNOSIS — Z79899 Other long term (current) drug therapy: Secondary | ICD-10-CM | POA: Insufficient documentation

## 2012-04-07 DIAGNOSIS — R296 Repeated falls: Secondary | ICD-10-CM | POA: Insufficient documentation

## 2012-04-07 DIAGNOSIS — F3160 Bipolar disorder, current episode mixed, unspecified: Secondary | ICD-10-CM | POA: Insufficient documentation

## 2012-04-07 DIAGNOSIS — I10 Essential (primary) hypertension: Secondary | ICD-10-CM | POA: Insufficient documentation

## 2012-04-07 DIAGNOSIS — Z8679 Personal history of other diseases of the circulatory system: Secondary | ICD-10-CM | POA: Insufficient documentation

## 2012-04-07 DIAGNOSIS — J45909 Unspecified asthma, uncomplicated: Secondary | ICD-10-CM | POA: Insufficient documentation

## 2012-04-07 DIAGNOSIS — F329 Major depressive disorder, single episode, unspecified: Secondary | ICD-10-CM | POA: Insufficient documentation

## 2012-04-07 DIAGNOSIS — Z87891 Personal history of nicotine dependence: Secondary | ICD-10-CM | POA: Insufficient documentation

## 2012-04-07 DIAGNOSIS — F489 Nonpsychotic mental disorder, unspecified: Secondary | ICD-10-CM | POA: Insufficient documentation

## 2012-04-07 DIAGNOSIS — F3289 Other specified depressive episodes: Secondary | ICD-10-CM | POA: Insufficient documentation

## 2012-04-07 DIAGNOSIS — R45851 Suicidal ideations: Secondary | ICD-10-CM | POA: Insufficient documentation

## 2012-04-07 DIAGNOSIS — Y929 Unspecified place or not applicable: Secondary | ICD-10-CM | POA: Insufficient documentation

## 2012-04-07 DIAGNOSIS — Y939 Activity, unspecified: Secondary | ICD-10-CM | POA: Insufficient documentation

## 2012-04-07 DIAGNOSIS — S63509A Unspecified sprain of unspecified wrist, initial encounter: Secondary | ICD-10-CM | POA: Insufficient documentation

## 2012-04-07 LAB — RAPID URINE DRUG SCREEN, HOSP PERFORMED
Amphetamines: NOT DETECTED
Barbiturates: NOT DETECTED
Benzodiazepines: NOT DETECTED
Cocaine: NOT DETECTED
Tetrahydrocannabinol: NOT DETECTED

## 2012-04-07 LAB — CBC WITH DIFFERENTIAL/PLATELET
Eosinophils Absolute: 0.1 10*3/uL (ref 0.0–0.7)
Hemoglobin: 13.9 g/dL (ref 13.0–17.0)
Lymphs Abs: 1.6 10*3/uL (ref 0.7–4.0)
MCH: 30.3 pg (ref 26.0–34.0)
Monocytes Relative: 11 % (ref 3–12)
Neutro Abs: 4.8 10*3/uL (ref 1.7–7.7)
Neutrophils Relative %: 65 % (ref 43–77)
RBC: 4.58 MIL/uL (ref 4.22–5.81)

## 2012-04-07 LAB — BASIC METABOLIC PANEL
BUN: 12 mg/dL (ref 6–23)
CO2: 24 mEq/L (ref 19–32)
Chloride: 102 mEq/L (ref 96–112)
Glucose, Bld: 91 mg/dL (ref 70–99)
Potassium: 3.4 mEq/L — ABNORMAL LOW (ref 3.5–5.1)

## 2012-04-07 MED ORDER — SILVER SULFADIAZINE 1 % EX CREA
TOPICAL_CREAM | CUTANEOUS | Status: AC
  Administered 2012-04-07: 22:00:00
  Filled 2012-04-07: qty 50

## 2012-04-07 MED ORDER — PAROXETINE HCL 20 MG PO TABS: 40.0000 mg | ORAL_TABLET | Freq: Every day | ORAL | Status: DC

## 2012-04-07 MED ORDER — CLONAZEPAM 0.5 MG PO TABS
0.5000 mg | ORAL_TABLET | Freq: Three times a day (TID) | ORAL | Status: DC
  Administered 2012-04-07 – 2012-04-08 (×4): 0.5 mg via ORAL
  Filled 2012-04-07 (×4): qty 1

## 2012-04-07 MED ORDER — ZIPRASIDONE HCL 20 MG PO CAPS
60.0000 mg | ORAL_CAPSULE | Freq: Every day | ORAL | Status: DC
  Administered 2012-04-07: 60 mg via ORAL
  Filled 2012-04-07: qty 3

## 2012-04-07 MED ORDER — QUETIAPINE FUMARATE 300 MG PO TABS
400.0000 mg | ORAL_TABLET | Freq: Every day | ORAL | Status: DC
  Administered 2012-04-07: 400 mg via ORAL
  Filled 2012-04-07: qty 1

## 2012-04-07 MED ORDER — GABAPENTIN 300 MG PO CAPS
300.0000 mg | ORAL_CAPSULE | Freq: Three times a day (TID) | ORAL | Status: DC
  Administered 2012-04-07 – 2012-04-08 (×4): 300 mg via ORAL
  Filled 2012-04-07 (×5): qty 1

## 2012-04-07 MED ORDER — ALUM & MAG HYDROXIDE-SIMETH 200-200-20 MG/5ML PO SUSP: 30.0000 mL | ORAL | Status: DC | PRN

## 2012-04-07 MED ORDER — IBUPROFEN 200 MG PO TABS: 400.0000 mg | ORAL_TABLET | Freq: Four times a day (QID) | ORAL | Status: DC | PRN

## 2012-04-07 MED ORDER — SILVER SULFADIAZINE 1 % EX CREA
TOPICAL_CREAM | Freq: Two times a day (BID) | CUTANEOUS | Status: DC
  Administered 2012-04-08: 09:00:00 via TOPICAL
  Filled 2012-04-07: qty 100

## 2012-04-07 MED ORDER — PAROXETINE HCL 20 MG PO TABS
40.0000 mg | ORAL_TABLET | Freq: Every day | ORAL | Status: DC
  Administered 2012-04-07 – 2012-04-08 (×2): 40 mg via ORAL
  Filled 2012-04-07 (×2): qty 2

## 2012-04-07 MED ORDER — ALBUTEROL SULFATE HFA 108 (90 BASE) MCG/ACT IN AERS: 2.0000 | INHALATION_SPRAY | Freq: Four times a day (QID) | RESPIRATORY_TRACT | Status: DC | PRN
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
## 2012-04-07 NOTE — ED Notes (Signed)
Pt with history of bipolar disorder, depression, and schizophrenia is feeling like he doesn't want to live anymore.  No plan and no homicidal ideation.  Pt is having trouble sleeping and has not slept since Thursday night.  He also fell and hit his left wrist on a stair this morning.  Minimal swelling and no deformity.  Pt has history of previous suicide attempt in which he cut his wrist.

## 2012-04-07 NOTE — ED Notes (Signed)
2 bags of personal belongings in locker #36

## 2012-04-07 NOTE — [facility] Counselor (Signed)
Telepsych consult ordered and info faxed.

## 2012-04-08 ENCOUNTER — Encounter (HOSPITAL_COMMUNITY): Payer: Self-pay | Admitting: *Deleted

## 2012-04-08 ENCOUNTER — Inpatient Hospital Stay (HOSPITAL_COMMUNITY)
Admission: AD | Admit: 2012-04-08 | Discharge: 2012-04-16 | DRG: 885 | Disposition: A | Discharge status: Home / Self Care | Payer: No Typology Code available for payment source | Source: Ambulatory Visit | Attending: Psychiatry | Admitting: Psychiatry

## 2012-04-08 DIAGNOSIS — F332 Major depressive disorder, recurrent severe without psychotic features: Principal | ICD-10-CM | POA: Diagnosis present

## 2012-04-08 DIAGNOSIS — I10 Essential (primary) hypertension: Secondary | ICD-10-CM | POA: Diagnosis present

## 2012-04-08 DIAGNOSIS — F329 Major depressive disorder, single episode, unspecified: Secondary | ICD-10-CM | POA: Diagnosis present

## 2012-04-08 DIAGNOSIS — F32A Depression, unspecified: Secondary | ICD-10-CM | POA: Diagnosis present

## 2012-04-08 DIAGNOSIS — Z634 Disappearance and death of family member: Secondary | ICD-10-CM

## 2012-04-08 DIAGNOSIS — J45909 Unspecified asthma, uncomplicated: Secondary | ICD-10-CM | POA: Diagnosis present

## 2012-04-08 DIAGNOSIS — F1010 Alcohol abuse, uncomplicated: Secondary | ICD-10-CM

## 2012-04-08 DIAGNOSIS — Z23 Encounter for immunization: Secondary | ICD-10-CM

## 2012-04-08 DIAGNOSIS — Z79899 Other long term (current) drug therapy: Secondary | ICD-10-CM

## 2012-04-08 DIAGNOSIS — F419 Anxiety disorder, unspecified: Secondary | ICD-10-CM

## 2012-04-08 DIAGNOSIS — F411 Generalized anxiety disorder: Secondary | ICD-10-CM | POA: Diagnosis present

## 2012-04-08 DIAGNOSIS — F323 Major depressive disorder, single episode, severe with psychotic features: Secondary | ICD-10-CM

## 2012-04-08 MED ORDER — SILVER SULFADIAZINE 1 % EX CREA
TOPICAL_CREAM | Freq: Two times a day (BID) | CUTANEOUS | Status: DC
  Administered 2012-04-08: 1 via TOPICAL
  Administered 2012-04-09 – 2012-04-14 (×7): via TOPICAL
  Filled 2012-04-08: qty 85

## 2012-04-08 MED ORDER — ACETAMINOPHEN 325 MG PO TABS
650.0000 mg | ORAL_TABLET | Freq: Four times a day (QID) | ORAL | Status: DC | PRN
  Administered 2012-04-09: 650 mg via ORAL

## 2012-04-08 MED ORDER — NICOTINE 21 MG/24HR TD PT24
21.0000 mg | MEDICATED_PATCH | Freq: Every day | TRANSDERMAL | Status: DC
  Filled 2012-04-08 (×4): qty 1

## 2012-04-08 MED ORDER — TRAZODONE HCL 50 MG PO TABS
50.0000 mg | ORAL_TABLET | Freq: Every evening | ORAL | Status: DC | PRN
  Administered 2012-04-08 – 2012-04-13 (×6): 50 mg via ORAL
  Filled 2012-04-08 (×16): qty 1

## 2012-04-08 MED ORDER — ALUM & MAG HYDROXIDE-SIMETH 200-200-20 MG/5ML PO SUSP: 30.0000 mL | ORAL | Status: DC | PRN

## 2012-04-08 MED ORDER — MAGNESIUM HYDROXIDE 400 MG/5ML PO SUSP: 30.0000 mL | Freq: Every day | ORAL | Status: DC | PRN

## 2012-04-08 NOTE — ED Notes (Signed)
Pt. Calm cooperative, no distress noted.

## 2012-04-08 NOTE — [facility] Counselor (Signed)
Per Gabriel Cirri at [facility] - pt accepted by [facility] MD pending bed availability

## 2012-04-08 NOTE — ED Provider Notes (Signed)
[name] [name] is a 31 y.o. male here with suicidal ideation. Telepsych saw patient yesterday, recommend admission. He is now pending bed placement. Patient sleeping comfortable in AM, no issues overnight as per nursing.    Richardean Canal, MD 04/08/12 [phone]

## 2012-04-08 NOTE — Tx Team (Signed)
Initial Interdisciplinary Treatment Plan  PATIENT STRENGTHS: (choose at least two) Ability for insight Average or above average intelligence Communication skills Supportive family/friends  PATIENT STRESSORS: Educational concerns Health problems Occupational concerns   PROBLEM LIST: Problem List/Patient Goals Date to be addressed Date deferred Reason deferred Estimated date of resolution  Suicidal Ideations 04-08-12     Depression 04-08-12     HTN 04-08-12     Asthma 04-08-12                                    DISCHARGE CRITERIA:  Improved stabilization in mood, thinking, and/or behavior Medical problems require only outpatient monitoring Need for constant or close observation no longer present Reduction of life-threatening or endangering symptoms to within safe limits Verbal commitment to aftercare and medication compliance  PRELIMINARY DISCHARGE PLAN: Outpatient therapy Participate in family therapy Return to previous living arrangement  PATIENT/FAMIILY INVOLVEMENT: This treatment plan has been presented to and reviewed with the patient, [name] [name], and/or family member.  The patient and family have been given the opportunity to ask questions and make suggestions.  Mickeal Needy 04/08/2012, 10:09 PM

## 2012-04-08 NOTE — Progress Notes (Signed)
CSW faxed support paperwork to [facility] and sent information to [facility] for sponsorship. Rn informed to call report.   .No further Clinical Social Work needs, signing off. Marland Kitchen   Catha Gosselin, LCSWA  [phone] .04/08/2012 8:08pm

## 2012-04-08 NOTE — BH Assessment (Signed)
Assessment Note   [name] [name] is an 31 y.o. male presents voluntarily to [facility] via EMS. He endorses suicidal ideation with no plan. He denies HI and [facility]. No delusions noted. Pt has previously endorsed AH with command and [facility] during his 03/19/12 ACT assessment. Pt endorses depressed mood. He endorses insomnia, loss of interest, worthlessness and isolating. Pt has tried to commit suicide twice in past. Pt reports not having slept in 4 days. His affect is depressed and irritable. He constantly shakes his feet. He endorses mild anxiety. Pt had been to [facility]  x3 (most recent July 2013), [facility] and most recently at [facility] Oct 2013 for SI and depression. Pt receives med management from [facility]. He lives w/ his sister. Pt drinks alcohol less than once a month.  Telepsych MD Pam Drown recommends inpatient admission.  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 Mental disorder   . Hypertension   . Depression   . Bipolar 1 disorder   . Irregular heart rate   . Asthma    Axis IV: other psychosocial or environmental problems and problems related to social environment Axis V: 31-40 impairment in reality testing  Past Medical History:  Past Medical History  Diagnosis Date  . Mental disorder   . Hypertension   . Depression   . Bipolar 1 disorder   . Irregular heart rate   . Asthma     History reviewed. No pertinent past surgical history.  Family History:  Family History  Problem Relation Age of Onset  . Hypertension Mother   . Hypertension Other     Social History:  reports that he has quit smoking. He does not have any smokeless tobacco history on file. He reports that he drinks alcohol. He reports that he does not use illicit drugs.  Additional Social History:  Alcohol / Drug Use Pain Medications: n/a Prescriptions: see PTA meds list Over the Counter:  see PTA meds list History of alcohol / drug use?: Yes Longest period of sobriety (when/how long): unknown Substance #1 Name of Substance 1: etoh 1 - Age of First Use: 18  1 - Amount (size/oz): 5-6 beers 1 - Frequency: less than once per month 1 - Duration: weeks 1 - Last Use / Amount: 03/16/12  CIWA: CIWA-Ar BP: 97/55 mmHg Pulse Rate: 60  COWS:    Allergies: No Known Allergies  Home Medications:  (Not in a hospital admission)  OB/GYN Status:  No LMP for male patient.  General Assessment Data Location of Assessment: [facility] ED Living Arrangements: Other relatives Can pt return to current living arrangement?: Yes Admission Status: Voluntary Is patient capable of signing voluntary admission?: Yes Transfer from: Acute Hospital Referral Source: Self/Family/Friend  Education Status Is patient currently in school?: No Current Grade: na Highest grade of school patient has completed: 5  Risk to self Suicidal Ideation: Yes-Currently Present Suicidal Intent: No Is patient at risk for suicide?: Yes Suicidal Plan?: No Specify Current Suicidal Plan: pt denies plan Access to Means: No Specify Access to Suicidal Means: none What has been your use of drugs/alcohol within the last 12 months?: alcohol once a month Previous Attempts/Gestures: Yes How many times?: 2  Other Self Harm Risks: t denies Triggers for Past Attempts: Anniversary;Unpredictable;Other (Comment) Intentional Self Injurious Behavior: None Family Suicide History: No Recent stressful life event(s):  (pt denies stressors) Persecutory voices/beliefs?: No Depression: Yes Depression  Symptoms: Despondent;Isolating;Loss of interest in usual pleasures;Feeling worthless/self pity;Insomnia Substance abuse history and/or treatment for substance abuse?: Yes Suicide prevention information given to non-admitted patients: Not applicable  Risk to Others Homicidal Ideation: No Thoughts of Harm to Others: No Current Homicidal  Intent: No Current Homicidal Plan: No Access to Homicidal Means: No Identified Victim: none History of harm to others?: No Assessment of Violence: None Noted Violent Behavior Description: pt denies hx of violence/calm during assessment Does patient have access to weapons?: No Criminal Charges Pending?: No Does patient have a court date: No  Psychosis Hallucinations: None noted Delusions: None noted  Mental Status Report Appear/Hygiene: Other (Comment) (unremarkable) Eye Contact: Fair Motor Activity: Freedom of movement;Restlessness Speech: Logical/coherent Level of Consciousness: Quiet/awake Mood: Depressed;Sad Affect: Irritable;Appropriate to circumstance Anxiety Level: Minimal Thought Processes: Coherent;Relevant Judgement: Impaired Orientation: Person;Place;Time;Situation Obsessive Compulsive Thoughts/Behaviors: None  Cognitive Functioning Concentration: Normal Memory: Recent Intact;Remote Intact IQ: Average Insight: Poor Impulse Control: Fair Appetite: Fair Weight Loss: 0  Weight Gain: 0  Sleep: Decreased Total Hours of Sleep: 0  (pt sts hasn't slept since 04/04/12) Vegetative Symptoms: None  ADLScreening [facility] Assessment Services) Patient's cognitive ability adequate to safely complete daily activities?: Yes Patient able to express need for assistance with ADLs?: Yes Independently performs ADLs?: Yes (appropriate for developmental age)  Abuse/Neglect [facility]) Physical Abuse: Denies Verbal Abuse: Denies Sexual Abuse: Denies  Prior Inpatient Therapy Prior Inpatient Therapy: Yes Prior Therapy Dates: 03/2012 Prior Therapy Facilty[facility]Provider([facility] Reason for Treatment: Stabilization  Prior Outpatient Therapy Prior Outpatient Therapy: Yes Prior Therapy Dates: Current Prior Therapy Facilty/Provider(s): [facility] Reason for Treatment: Med Management  ADL Screening (condition at time of admission) Patient's cognitive ability  adequate to safely complete daily activities?: Yes Patient able to express need for assistance with ADLs?: Yes Independently performs ADLs?: Yes (appropriate for developmental age) Weakness of Legs: None Weakness of Arms/Hands: None  Home Assistive Devices/Equipment Home Assistive Devices/Equipment: None    Abuse/Neglect Assessment (Assessment to be complete while patient is alone) Physical Abuse: Denies Verbal Abuse: Denies Sexual Abuse: Denies Exploitation of patient/patient's resources: Denies Self-Neglect: Denies     Merchant navy officer (For Healthcare) Advance Directive: Patient does not have advance directive;Patient would not like information    Additional Information 1:1 In Past 12 Months?: Yes CIRT Risk: No Elopement Risk: No Does patient have medical clearance?: Yes     Disposition:  Disposition Disposition of Patient: Inpatient treatment program (telepsych Leretha Pol MD rec. inpatient) Type of inpatient treatment program: Adult  On Site Evaluation by:   Reviewed with Physician:     Shirlee Latch, Euell Schiff P 04/08/2012 2:08 AM

## 2012-04-08 NOTE — ED Provider Notes (Signed)
Telepsych recs admit.  Hurman Horn, MD 04/08/12 [phone]

## 2012-04-08 NOTE — ED Notes (Signed)
Pt transferred to TCU room 28.

## 2012-04-09 DIAGNOSIS — F329 Major depressive disorder, single episode, unspecified: Secondary | ICD-10-CM

## 2012-04-09 LAB — TSH: TSH: 0.483 u[IU]/mL (ref 0.350–4.500)

## 2012-04-09 LAB — COMPREHENSIVE METABOLIC PANEL
ALT: 17 U/L (ref 0–53)
Alkaline Phosphatase: 95 U/L (ref 39–117)
BUN: 17 mg/dL (ref 6–23)
CO2: 25 mEq/L (ref 19–32)
Calcium: 9.4 mg/dL (ref 8.4–10.5)
GFR calc Af Amer: >90 mL/min (ref >90)
GFR calc non Af Amer: >90 mL/min (ref >90)
Glucose, Bld: 85 mg/dL (ref 70–99)
Potassium: 3.9 mEq/L (ref 3.5–5.1)
Sodium: 138 mEq/L (ref 135–145)

## 2012-04-09 MED ORDER — QUETIAPINE FUMARATE 400 MG PO TABS
400.0000 mg | ORAL_TABLET | Freq: Every day | ORAL | Status: DC
  Administered 2012-04-09 – 2012-04-15 (×7): 400 mg via ORAL
  Filled 2012-04-09 (×9): qty 1

## 2012-04-09 MED ORDER — ALBUTEROL SULFATE HFA 108 (90 BASE) MCG/ACT IN AERS
2.0000 | INHALATION_SPRAY | RESPIRATORY_TRACT | Status: DC | PRN
  Administered 2012-04-15: 2 via RESPIRATORY_TRACT

## 2012-04-09 MED ORDER — ALBUTEROL SULFATE HFA 108 (90 BASE) MCG/ACT IN AERS
2.0000 | INHALATION_SPRAY | Freq: Four times a day (QID) | RESPIRATORY_TRACT | Status: DC | PRN
  Administered 2012-04-13: 2 via RESPIRATORY_TRACT
  Filled 2012-04-09: qty 6.7

## 2012-04-09 MED ORDER — IBUPROFEN 200 MG PO TABS
400.0000 mg | ORAL_TABLET | Freq: Four times a day (QID) | ORAL | Status: DC | PRN
  Administered 2012-04-14: 400 mg via ORAL
  Filled 2012-04-09: qty 2

## 2012-04-09 MED ORDER — CLONAZEPAM 0.5 MG PO TABS
0.5000 mg | ORAL_TABLET | Freq: Three times a day (TID) | ORAL | Status: DC
  Administered 2012-04-09 – 2012-04-11 (×7): 0.5 mg via ORAL
  Filled 2012-04-09 (×7): qty 1

## 2012-04-09 MED ORDER — ZIPRASIDONE HCL 60 MG PO CAPS
60.0000 mg | ORAL_CAPSULE | Freq: Every day | ORAL | Status: DC
  Administered 2012-04-09 – 2012-04-11 (×3): 60 mg via ORAL
  Filled 2012-04-09 (×5): qty 1

## 2012-04-09 MED ORDER — GABAPENTIN 300 MG PO CAPS
300.0000 mg | ORAL_CAPSULE | Freq: Three times a day (TID) | ORAL | Status: DC
  Administered 2012-04-09 – 2012-04-14 (×15): 300 mg via ORAL
  Filled 2012-04-09 (×22): qty 1

## 2012-04-09 MED ORDER — PAROXETINE HCL 20 MG PO TABS
40.0000 mg | ORAL_TABLET | Freq: Every day | ORAL | Status: DC
  Administered 2012-04-09 – 2012-04-16 (×8): 40 mg via ORAL
  Filled 2012-04-09 (×11): qty 2
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
## 2012-04-09 NOTE — Progress Notes (Signed)
Psychoeducational Group Note  Date:  04/09/2012 Time:  1100  Group Topic/Focus:  Recovery Goals:   The focus of this group is to identify appropriate goals for recovery and establish a plan to achieve them.  Participation Level:  Did Not Attend   Ralph Leyden 04/09/2012, 2:02 PM

## 2012-04-09 NOTE — Treatment Plan (Signed)
Interdisciplinary Treatment Plan Update (Adult)  Date: 04/09/2012  Time Reviewed: 10:55 AM   Progress in Treatment: Attending groups: Yes Participating in groups: Yes Taking medication as prescribed: Yes Tolerating medication: Yes   Family/Significant other contact made:   Patient understands diagnosis:  Yes  As evidenced by asking for help with stopping this downward spiral of depression Discussing patient identified problems/goals with staff:  Yes  See below Medical problems stabilized or resolved:  Yes Denies suicidal/homicidal ideation: No  Passive SI Issues/concerns per patient self-inventory:  Yes C/O poor sleep, low energy, dizziness, pain, lightheadedness  Rates pain, depression and hopelessness 10's. Other:  New problem(s) identified: N/A  Reason for Continuation of Hospitalization: Depression Medication stabilization Suicidal ideation  Interventions implemented related to continuation of hospitalization: Restart previous meds, encourage group attendance and participation  Additional comments:  Estimated length of stay: 3-4 days  Discharge Plan: return home, follow up outpt  New goal(s): N/A  Review of initial/current patient goals per problem list:   1.  Goal(s): Eliminate SI  Met:  No  Target date:11/8  As evidenced YQ:MVHQ report-today states he has on-going thoughts of self harm, but no plan, and can contract for safety  2.  Goal (s):Stabilize mood  Met:  No  Target date:11/8  As evidenced IO:NGEXB will rate his depression and anxiety at a 4 or less  3.  Goal(s): Identify comprehensive mental wellness plan  Met:  No  Target date:11/8  As evidenced MW:UXLK report  4.  Goal(s):  Met:  No  Target date:  As evidenced by:  Attendees: Patient:  [name] [name] 04/09/2012 10:55 AM  Family:     Physician:  Dr Daleen Bo 04/09/2012 10:55 AM   Nursing:    04/09/2012 10:55 AM   Clinical Social Worker:  Richelle Ito 04/09/2012 10:55 AM   Extender:   04/09/2012 10:55 AM   Other:     Other:     Other:     Other:      Scribe for Treatment Team:   Ida Rogue, 04/09/2012 10:55 AM
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
## 2012-04-09 NOTE — Discharge Planning (Signed)
Found [name] [name] is bed this afternoon, napping, engageable.  States he is here due to depression with accompanying thoughts of self harm.  LIves with sister.  Not working, but knows he needs to feel better about himself.  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  Stabilized on meds last time he was here in the summer.  Follows up at [facility] and insists he was taking meds as prescribed.  No therapist, but open to referral as well as to VR.

## 2012-04-09 NOTE — [facility] Suicide Risk Assessment (Signed)
Suicide Risk Assessment  Admission Assessment     Nursing information obtained from:  Patient Demographic factors:  Male;Caucasian;Low socioeconomic status;Unemployed Current Mental Status:  Suicidal ideation indicated by patient;Belief that plan would result in death Loss Factors:  Decrease in vocational status;Financial problems / change in socioeconomic status Historical Factors:  Prior suicide attempts Risk Reduction Factors:  Sense of responsibility to family;Living with another person, especially a relative  CLINICAL FACTORS:   Depression:   Anhedonia Hopelessness Impulsivity Insomnia  COGNITIVE FEATURES THAT CONTRIBUTE TO RISK:  Thought constriction (tunnel vision)    SUICIDE RISK:   Mild:  Suicidal ideation of limited frequency, intensity, duration, and specificity.  There are no identifiable plans, no associated intent, mild dysphoria and related symptoms, good self-control (both objective and subjective assessment), few other risk factors, and identifiable protective factors, including available and accessible social support.  PLAN OF CARE: Restart home medications. Education about illness and re;lapse prevention measures.   [name] [name] 04/09/2012, 10:53 AM
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
## 2012-04-10 NOTE — Progress Notes (Signed)
[facility] MD Progress Note  04/10/2012 2:28 PM [name] [name]  MRN:  [id number]  S: Patient sleeping in room. Able to wake up easily. Continues to endorse suicidal thoughts, denies plan. Not attending groups, being in crowds makes him uncomfortable.  Diagnosis:   Axis I: Depressive Disorder NOS Axis II: No diagnosis Axis III:  Past Medical History  Diagnosis Date  . Mental disorder   . Hypertension   . Depression   . Bipolar 1 disorder   . Irregular heart rate   . Asthma    Axis IV: occupational problems and other psychosocial or environmental problems Axis V: 51-60 moderate symptoms  ADL's:  Intact  Sleep: Fair  Appetite:  Fair  Suicidal Ideation:     Mental Status Examination/Evaluation: Objective:  Appearance: Disheveled  Eye Contact::  Fair  Speech:  Slow  Volume:  Decreased  Mood:  Depressed and Dysphoric  Affect:  Blunt  Thought Process:  Circumstantial  Orientation:  Full  Thought Content:  WDL  Suicidal Thoughts:  Yes.  without intent/plan  Homicidal Thoughts:  No  Memory:  Immediate;   Fair Recent;   Fair Remote;   Fair  Judgement:  Fair  Insight:  Fair  Psychomotor Activity:  Normal  Concentration:  Fair  Recall:  Fair  Akathisia:  No  Handed:  Right  AIMS (if indicated):     Assets:  Desire for Improvement  Sleep:  Number of Hours: 6.5    Vital Signs:Blood pressure 138/82, pulse 98, temperature 97.7 F (36.5 C), temperature source Oral, resp. rate 18, height 6\' 1"  (1.854 m), weight 127.007 kg (280 lb). Current Medications: Current Facility-Administered Medications  Medication Dose Route Frequency Provider Last Rate Last Dose  . acetaminophen (TYLENOL) tablet 650 mg  650 mg Oral Q6H PRN Kerry Hough, PA   650 mg at 04/09/12 1443  . albuterol (PROVENTIL HFA;VENTOLIN HFA) 108 (90 BASE) MCG/ACT inhaler 2 puff  2 puff Inhalation Q4H PRN Kerry Hough, PA      . albuterol (PROVENTIL HFA;VENTOLIN HFA) 108 (90 BASE) MCG/ACT inhaler 2 puff  2  puff Inhalation Q6H PRN Kurk Corniel, MD      . alum & mag hydroxide-simeth (MAALOX/MYLANTA) 200-200-20 MG/5ML suspension 30 mL  30 mL Oral Q4H PRN Kerry Hough, PA      . clonazePAM (KLONOPIN) tablet 0.5 mg  0.5 mg Oral TID Hartley Urton, MD   0.5 mg at 04/10/12 1156  . gabapentin (NEURONTIN) capsule 300 mg  300 mg Oral TID Leelan Rajewski, MD   300 mg at 04/10/12 1156  . ibuprofen (ADVIL,MOTRIN) tablet 400 mg  400 mg Oral Q6H PRN Lorris Carducci, MD      . magnesium hydroxide (MILK OF MAGNESIA) suspension 30 mL  30 mL Oral Daily PRN Kerry Hough, PA      . nicotine (NICODERM CQ - dosed in mg/24 hours) patch 21 mg  21 mg Transdermal Q0600 Kerry Hough, PA      . PARoxetine (PAXIL) tablet 40 mg  40 mg Oral Daily Muneeb Veras, MD   40 mg at 04/10/12 0829  . QUEtiapine (SEROQUEL) tablet 400 mg  400 mg Oral QHS Hayzlee Mcsorley, MD   400 mg at 04/09/12 2156  . silver sulfADIAZINE (SILVADENE) 1 % cream   Topical BID Kerry Hough, PA      . traZODone (DESYREL) tablet 50 mg  50 mg Oral QHS,MR X 1 Kerry Hough, PA   50 mg at 04/09/12  2156  . ziprasidone (GEODON) capsule 60 mg  60 mg Oral QHS Evanthia Maund, MD   60 mg at 04/09/12 2156    Lab Results:  Results for orders placed during the hospital encounter of 04/08/12 (from the past 48 hour(s))  COMPREHENSIVE METABOLIC PANEL     Status: Normal   Collection Time   04/09/12  6:19 AM      Component Value Range Comment   Sodium 138  135 - 145 mEq/L    Potassium 3.9  3.5 - 5.1 mEq/L    Chloride 104  96 - 112 mEq/L    CO2 25  19 - 32 mEq/L    Glucose, Bld 85  70 - 99 mg/dL    BUN 17  6 - 23 mg/dL    Creatinine, Ser 1.61  0.50 - 1.35 mg/dL    Calcium 9.4  8.4 - 09.6 mg/dL    Total Protein 6.7  6.0 - 8.3 g/dL    Albumin 3.8  3.5 - 5.2 g/dL    AST 14  0 - 37 U/L    ALT 17  0 - 53 U/L    Alkaline Phosphatase 95  39 - 117 U/L    Total Bilirubin 1.0  0.3 - 1.2 mg/dL    GFR calc non Af Amer >90  >90 mL/min    GFR calc Af Amer >90  >90  mL/min   TSH     Status: Normal   Collection Time   04/09/12  6:19 AM      Component Value Range Comment   TSH 0.483  0.350 - 4.500 uIU/mL     Physical Findings: AIMS: Facial and Oral Movements Muscles of Facial Expression: None, normal Lips and Perioral Area: None, normal Jaw: None, normal Tongue: None, normal,Extremity Movements Upper (arms, wrists, hands, fingers): None, normal Lower (legs, knees, ankles, toes): None, normal, Trunk Movements Neck, shoulders, hips: None, normal, Overall Severity Severity of abnormal movements (highest score from questions above): None, normal Incapacitation due to abnormal movements: None, normal Patient's awareness of abnormal movements (rate only patient's report): No Awareness, Dental Status Current problems with teeth and/or dentures?: No Does patient usually wear dentures?: No  CIWA:  CIWA-Ar Total: 0  COWS:  COWS Total Score: 1   Treatment Plan Summary: Daily contact with patient to assess and evaluate symptoms and progress in treatment Medication management  Plan: Continue current plan of care. Encourage patient to attend groups.  Saman Umstead 04/10/2012, 2:28 PM

## 2012-04-10 NOTE — Progress Notes (Signed)
Patient ID: [name] [name], male   DOB: January 30, 1981, 31 y.o.   MRN: [id number] D: Pt. Reports "trying to figure a lot of things out" "what I got to do when I get out of here" "who I got hang out with and who not to" Pt. Reports depression at "7" of 10.  Pt. Denies SHI. A: Pt. Encouraged to go to group. Staff will monitor q49min for safety. Writer provided positive support by listening and encouraged client to associate with positive minded people. R: Pt. Is safe on the unit. Pt. Attends group. No distress noted.

## 2012-04-10 NOTE — Progress Notes (Signed)
Adult Psychosocial Assessment Update Interdisciplinary Team  [facility] admissions[facility]discharges:  Admissions   Date: 04/08/2012 Date:  Date: 12/30/2011 Date:  Date: 11/22/2011 Date:  Date: 11/01/2011 Date:  Date: Date:   Changes since the last Psychosocial Assessment (including adherence to outpatient mental health and/or substance abuse treatment, situational issues contributing to decompensation and/or relapse). Patient follows up with [facility] and insists that he has been taking meds as prescribed. Pt continues to drink alcohol occasionally and lives with his sister. He is currently unemployed and has been experiencing a loss of interest in activities, thoughts of hopelessness and worthlessness, insomnia, SI,  and depressed mood.              Discharge Plan 1. Will you be returning to the same living situation after discharge?   Yes: living with sister No:      If no, what is your plan?           2. Would you like a referral for services when you are discharged? Yes:     If yes, for what services?  No:       Currently receives services at [facility] for med management. He expressed interest in finding a therapist and VR.       Summary and Recommendations (to be completed by the evaluator) [name] [name] is  A 31 YO Caucasian male who was admitted to [facility] for SI secondary to severe depression.  Interventions recommended include: Medication management for mood stabilization and elimination of psychotic symptoms, therapeutic milieu, encourage group attendance and participation, safety checks every 15 minutes. Patient follows up with [facility] for med management and is interested in exploring therapist options and VR.                        Signature:  Smart, Ledell Peoples, 04/10/2012 10:54 AM

## 2012-04-10 NOTE — Progress Notes (Signed)
D: Patient pleasant and cooperative with staff. He reported on self inventory sheet that his sleep is fair, energy level is low and ability to pay attention is improving. Patient rated depression and feelings of hopelessness "9".  A: Scheduled medications administered per MD orders. Support and encouragement provided to patient. Monitor 15 minute checks for safety.  R: Patient receptive. Denies SI/HI/AVH. Patient remains safe.

## 2012-04-10 NOTE — Progress Notes (Signed)
Psychoeducational Group Note  Date:  04/10/2012 Time:  2000  Group Topic/Focus:  Wrap-Up Group:   The focus of this group is to help patients review their daily goal of treatment and discuss progress on daily workbooks.  Participation Level:  Active  Participation Quality:  Sharing  Affect:  Appropriate  Cognitive:  Appropriate  Insight:  Limited  Engagement in Group:  Limited  Additional Comments:  Patient shared that all he did today was slept.  Cleaven Demario, Newton Pigg 04/10/2012, 10:33 PM

## 2012-04-10 NOTE — Progress Notes (Signed)
[facility] Group Notes:  (Counselor/Nursing/MHT/Case Management/Adjunct)  04/10/2012 4:46 PM  Type of Therapy:  Psychoeducational Skills  Participation Level:  Did Not Attend  Summary of Progress/Problems: [name] [name] refused to attend a Psychoeducational group that focused on using quality time with support systems/individuals to engage in healthy coping skills, instead remained in bed.  Wandra Scot 04/10/2012, 4:46 PM

## 2012-04-10 NOTE — Discharge Planning (Signed)
[name] [name] did not attend AM group.  I invited him to come when he was at the med window.  Was told by staff later that he declined to come when reminded, stating that he did not feel well and did not want to be around others.  He did not attend PM group either.  When I saw him in the hall later, and asked him what was going on, he stated that he was having a bed day [emotionally] and that it felt like he was actually getting worse instead of better.  Asked him what he needed from [name] [name].  Unable to say.

## 2012-04-10 NOTE — Progress Notes (Signed)
Psychoeducational Group Note  Date:  04/10/2012 Time: 2000  Group Topic/Focus:  Wrap-Up Group:   The focus of this group is to help patients review their daily goal of treatment and discuss progress on daily workbooks.  Participation Level:  Did Not Attend  Participation Quality:  Appropriate  Affect:  Depressed  Cognitive:  Appropriate  Insight:  None  Engagement in Group:  None  Additional Comments:  Patient was observed standing outside the door at times, but did not enter.   Rodman Pickle R 04/10/2012, 1:54 AM

## 2012-04-10 NOTE — Progress Notes (Signed)
D: Pt in bed resting with eyes closed. Respirations even and unlabored. Pt appears to be in no signs of distress at this time. A: Q15min checks remains for this pt. R: Pt remains safe at this time.   

## 2012-04-11 MED ORDER — CLONAZEPAM 0.5 MG PO TABS
0.5000 mg | ORAL_TABLET | Freq: Two times a day (BID) | ORAL | Status: DC
  Administered 2012-04-11 – 2012-04-14 (×6): 0.5 mg via ORAL
  Filled 2012-04-11 (×6): qty 1

## 2012-04-11 NOTE — Progress Notes (Signed)
D: Patient cooperative with staff; mood sad/depressed. Patient reported on self inventory sheet that his energy level is normal and ability to pay attention is improving. He rated depression and feelings of hopelessness "8".   A: Support and encouragement provided to patient. Administered scheduled medications per MD orders. Monitor 15 minute observation for safety.   R: Patient receptive; attending groups. Denies SI/HI/AVH. Patient remains safe on the unit.

## 2012-04-11 NOTE — Progress Notes (Signed)
Psychoeducational Group Note  Date:  04/11/2012 Time:  1000  Group Topic/Focus:  Goals Group:   The focus of this group is to help patients establish daily goals to achieve during treatment and discuss how the patient can incorporate goal setting into their daily lives to aide in recovery.  Participation Level:  None  Participation Quality:  Drowsy  Affect:  Labile  Cognitive:  Oriented  Insight:  Limited  Engagement in Group:  None  Additional Comments:  Patient appeared to be asleep during the majority of the group.   Noah Charon 04/11/2012, 11:36 AM

## 2012-04-11 NOTE — Discharge Planning (Signed)
Pt did not attend morning aftercare planning group. PSA completed on pt this afternoon. Pt reported feeling extremely depressed and feels that medications are not currently working. Pt is interested in seeing individual therapist and will follow up at [facility]. Pt stated that he had been staying with friends after moving out of his sister's home a few months ago. Pt is hoping to move back in with sister and her husband after discharge and signed consent for staff to contact her. Pt attended PM group that focused on "Mindfullnes and Balance." Pt participated in group exercise and activity. He shared a picture of money that represented balance for him, and elaborated that he needs to get a job so that he can get paid and support himself.

## 2012-04-11 NOTE — Progress Notes (Signed)
Psychoeducational Group Note  Date:  04/11/2012 Time:  2000  Group Topic/Focus:  Karaoke   Participation Level:  Active  Participation Quality:  Appropriate  Affect:  Appropriate  Cognitive:  Appropriate  Insight:  Good  Engagement in Group:  Good  Additional Comments:  Pt participated in group this evening.     [name] A 04/11/2012, 11:27 PM

## 2012-04-11 NOTE — Progress Notes (Signed)
Patient ID: EWALD BEG, male   DOB: 12/13/1980, 31 y.o.   MRN: [id number] D: Pt. Interacting in dayroom, laughing, excited about going to [employer]. Pt. Reports depression level has decreased now at "5" of 10. A: Pt. Will be monitored q30min for safety. Pt. Encouraged to go to karaoke and follow group rules. R: Pt. Is safe on the unit. Pt. Attends group.

## 2012-04-11 NOTE — Progress Notes (Signed)
[facility] MD Progress Note  04/11/2012 1:19 PM [name] [name]  MRN:  [id number]  S: Patient continues to report suicidal thoughts. Spending a significant amount of time sleeping, states he sleeps a lot when he is depressed.  Diagnosis:   Axis I: Depressive Disorder NOS Axis II: No diagnosis Axis III:  Past Medical History  Diagnosis Date  . Mental disorder   . Hypertension   . Depression   . Bipolar 1 disorder   . Irregular heart rate   . Asthma    Axis IV: economic problems and occupational problems Axis V: 51-60 moderate symptoms  ADL's:  Intact  Sleep: Good  Appetite:  Fair   Mental Status Examination/Evaluation: Objective:  Appearance: Casual  Eye Contact::  Fair  Speech:  Slow  Volume:  Decreased  Mood:  Depressed  Affect:  Appropriate  Thought Process:  Circumstantial  Orientation:  Full  Thought Content:  WDL  Suicidal Thoughts:  Yes.  without intent/plan  Homicidal Thoughts:  No  Memory:  Immediate;   Fair Recent;   Fair Remote;   Fair  Judgement:  Fair  Insight:  Lacking  Psychomotor Activity:  Normal  Concentration:  Fair  Recall:  Fair  Akathisia:  No  Handed:  Right  AIMS (if indicated):     Assets:  Physical Health  Sleep:  Number of Hours: 5.75    Vital Signs:Blood pressure 106/79, pulse 75, temperature 97.6 F (36.4 C), temperature source Oral, resp. rate 16, height 6\' 1"  (1.854 m), weight 127.007 kg (280 lb). Current Medications: Current Facility-Administered Medications  Medication Dose Route Frequency Provider Last Rate Last Dose  . acetaminophen (TYLENOL) tablet 650 mg  650 mg Oral Q6H PRN Kerry Hough, PA   650 mg at 04/09/12 1443  . albuterol (PROVENTIL HFA;VENTOLIN HFA) 108 (90 BASE) MCG/ACT inhaler 2 puff  2 puff Inhalation Q4H PRN Kerry Hough, PA      . albuterol (PROVENTIL HFA;VENTOLIN HFA) 108 (90 BASE) MCG/ACT inhaler 2 puff  2 puff Inhalation Q6H PRN Sheffield Hawker, MD      . alum & mag hydroxide-simeth (MAALOX/MYLANTA)  200-200-20 MG/5ML suspension 30 mL  30 mL Oral Q4H PRN Kerry Hough, PA      . clonazePAM (KLONOPIN) tablet 0.5 mg  0.5 mg Oral TID Malajah Oceguera, MD   0.5 mg at 04/11/12 1152  . gabapentin (NEURONTIN) capsule 300 mg  300 mg Oral TID Riese Hellard, MD   300 mg at 04/11/12 1151  . ibuprofen (ADVIL,MOTRIN) tablet 400 mg  400 mg Oral Q6H PRN Tinika Bucknam, MD      . magnesium hydroxide (MILK OF MAGNESIA) suspension 30 mL  30 mL Oral Daily PRN Kerry Hough, PA      . PARoxetine (PAXIL) tablet 40 mg  40 mg Oral Daily Bambie Pizzolato, MD   40 mg at 04/11/12 0806  . QUEtiapine (SEROQUEL) tablet 400 mg  400 mg Oral QHS Madalaine Portier, MD   400 mg at 04/10/12 2221  . silver sulfADIAZINE (SILVADENE) 1 % cream   Topical BID Kerry Hough, PA      . traZODone (DESYREL) tablet 50 mg  50 mg Oral QHS,MR X 1 Kerry Hough, PA   50 mg at 04/10/12 2221  . ziprasidone (GEODON) capsule 60 mg  60 mg Oral QHS Makinsley Schiavi, MD   60 mg at 04/10/12 2221  . [DISCONTINUED] nicotine (NICODERM CQ - dosed in mg/24 hours) patch 21 mg  21 mg  Transdermal Q0600 Kerry Hough, PA        Lab Results: No results found for this or any previous visit (from the past 48 hour(s)).  Physical Findings: AIMS: Facial and Oral Movements Muscles of Facial Expression: None, normal Lips and Perioral Area: None, normal Jaw: None, normal Tongue: None, normal,Extremity Movements Upper (arms, wrists, hands, fingers): None, normal Lower (legs, knees, ankles, toes): None, normal, Trunk Movements Neck, shoulders, hips: None, normal, Overall Severity Severity of abnormal movements (highest score from questions above): None, normal Incapacitation due to abnormal movements: None, normal Patient's awareness of abnormal movements (rate only patient's report): No Awareness, Dental Status Current problems with teeth and/or dentures?: No Does patient usually wear dentures?: No  CIWA:  CIWA-Ar Total: 0  COWS:  COWS Total Score: 1    Treatment Plan Summary: Daily contact with patient to assess and evaluate symptoms and progress in treatment Medication management  Plan: Taper Klonopin. Encourage patient to get up and attend groups.  Brittin Janik 04/11/2012, 1:19 PM

## 2012-04-11 NOTE — Progress Notes (Signed)
Psychoeducational Group Note  Date:  04/11/2012 Time: 1100  Group Topic/Focus:  Building Self Esteem:   The Focus of this group is helping patients become aware of the effects of self-esteem on their lives, the things they and others do that enhance or undermine their self-esteem, seeing the relationship between their level of self-esteem and the choices they make and learning ways to enhance self-esteem.  Participation Level:  Minimal  Participation Quality:  Drowsy  Affect:  Appropriate  Cognitive:  Appropriate  Insight:  Good  Engagement in Group:  Limited  Additional Comments:   Patient attended group and shared in group when asked, patient was drowsy during group and was sleeping at times. Patient was asked to share the definition of self-esteem in own terms and give information on positive and negative self esteem in ways that affect patient internally, physically, emotional and externally. After talking about the influences of self-esteem, patient was given a handout on self-esteem and a worksheet that patient during the group.   Karleen Hampshire Brittini 04/11/2012, 6:57 PM
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
## 2012-04-12 MED ORDER — ZIPRASIDONE HCL 20 MG PO CAPS
20.0000 mg | ORAL_CAPSULE | Freq: Two times a day (BID) | ORAL | Status: AC
  Administered 2012-04-12 – 2012-04-13 (×2): 20 mg via ORAL
  Filled 2012-04-12 (×2): qty 1

## 2012-04-12 NOTE — Progress Notes (Signed)
Patient ID: [name] [name], male   DOB: 08-29-1980, 31 y.o.   MRN: [id number] [facility] MD Progress Note  04/12/2012 4:35 PM KEIAN ODRISCOLL  MRN:  [id number]  S: Patient continues to report suicidal thoughts. Spending a significant amount of time sleeping, states he sleeps a lot when he is depressed. Today patient continues to have Suicidal thoughts.  States that he stays so tired and is unable to wake up or keep his eyes open.  States that he is unable to participate in group or other activities r/t to being so sleepy all the time.  Patient rates anxiety 6/10 and depression 8/10.  Patient states that he has not had any auditory hallucinations since last month.  States that the voices usually tells him to hurt himself or others and that he is better off dead.  Patient had very strong body odor.  Patient states that once discharged he is interested in continuing therapy OP.   Diagnosis:   Axis I: Depressive Disorder NOS Axis II: No diagnosis Axis III:  Past Medical History  Diagnosis Date  . Mental disorder   . Hypertension   . Depression   . Bipolar 1 disorder   . Irregular heart rate   . Asthma    Axis IV: economic problems and occupational problems Axis V: 51-60 moderate symptoms  ADL's:  Intact  Sleep: Good  Appetite:  Fair   Mental Status Examination/Evaluation: Objective:  Appearance: Casual  Eye Contact::  Fair  Speech:  Slow  Volume:  Decreased  Mood:  Depressed  Affect:  Appropriate  Thought Process:  Circumstantial  Orientation:  Full  Thought Content:  WDL  Suicidal Thoughts:  Yes.  without intent/plan  Homicidal Thoughts:  No  Memory:  Immediate;   Fair Recent;   Fair Remote;   Fair  Judgement:  Fair  Insight:  Lacking  Psychomotor Activity:  Normal  Concentration:  Fair  Recall:  Fair  Akathisia:  No  Handed:  Right  AIMS (if indicated):     Assets:  Physical Health  Sleep:  Number of Hours: 6    Vital Signs:Blood pressure 90/60, pulse 106,  temperature 97.5 F (36.4 C), temperature source Oral, resp. rate 18, height 6\' 1"  (1.854 m), weight 127.007 kg (280 lb).  Current Medications: Current Facility-Administered Medications  Medication Dose Route Frequency Provider Last Rate Last Dose  . acetaminophen (TYLENOL) tablet 650 mg  650 mg Oral Q6H PRN Kerry Hough, PA   650 mg at 04/09/12 1443  . albuterol (PROVENTIL HFA;VENTOLIN HFA) 108 (90 BASE) MCG/ACT inhaler 2 puff  2 puff Inhalation Q4H PRN Kerry Hough, PA      . albuterol (PROVENTIL HFA;VENTOLIN HFA) 108 (90 BASE) MCG/ACT inhaler 2 puff  2 puff Inhalation Q6H PRN Himabindu Ravi, MD      . alum & mag hydroxide-simeth (MAALOX/MYLANTA) 200-200-20 MG/5ML suspension 30 mL  30 mL Oral Q4H PRN Kerry Hough, PA      . clonazePAM (KLONOPIN) tablet 0.5 mg  0.5 mg Oral BID Himabindu Ravi, MD   0.5 mg at 04/12/12 0758  . gabapentin (NEURONTIN) capsule 300 mg  300 mg Oral TID Himabindu Ravi, MD   300 mg at 04/12/12 1256  . ibuprofen (ADVIL,MOTRIN) tablet 400 mg  400 mg Oral Q6H PRN Himabindu Ravi, MD      . magnesium hydroxide (MILK OF MAGNESIA) suspension 30 mL  30 mL Oral Daily PRN Kerry Hough, PA      .  PARoxetine (PAXIL) tablet 40 mg  40 mg Oral Daily Himabindu Ravi, MD   40 mg at 04/12/12 0758  . QUEtiapine (SEROQUEL) tablet 400 mg  400 mg Oral QHS Himabindu Ravi, MD   400 mg at 04/11/12 2205  . silver sulfADIAZINE (SILVADENE) 1 % cream   Topical BID Kerry Hough, PA      . traZODone (DESYREL) tablet 50 mg  50 mg Oral QHS,MR X 1 Kerry Hough, PA   50 mg at 04/11/12 2204  . ziprasidone (GEODON) capsule 20 mg  20 mg Oral BID Yunique Dearcos, NP      . [DISCONTINUED] ziprasidone (GEODON) capsule 60 mg  60 mg Oral QHS Himabindu Ravi, MD   60 mg at 04/11/12 2206    Lab Results: No results found for this or any previous visit (from the past 48 hour(s)).  Physical Findings: AIMS: Facial and Oral Movements Muscles of Facial Expression: None, normal Lips and Perioral Area:  None, normal Jaw: None, normal Tongue: None, normal,Extremity Movements Upper (arms, wrists, hands, fingers): None, normal Lower (legs, knees, ankles, toes): None, normal, Trunk Movements Neck, shoulders, hips: None, normal, Overall Severity Severity of abnormal movements (highest score from questions above): None, normal Incapacitation due to abnormal movements: None, normal Patient's awareness of abnormal movements (rate only patient's report): No Awareness, Dental Status Current problems with teeth and/or dentures?: No Does patient usually wear dentures?: No  CIWA:  CIWA-Ar Total: 0  COWS:  COWS Total Score: 1   Treatment Plan Summary: Daily contact with patient to assess and evaluate symptoms and progress in treatment Medication management  Plan: Taper Klonopin. Will decrease Geodon to 20 mg bid today and then to 20 mg qd for Saturday and Sunday. Encourage patient to get up and attend groups.  Mivaan Corbitt 04/12/2012, 4:35 PM

## 2012-04-12 NOTE — Treatment Plan (Signed)
Interdisciplinary Treatment Plan Update (Adult)  Date: 04/12/2012  Time Reviewed: 4:21 PM   Progress in Treatment: Attending groups: Yes Participating in groups: Yes Taking medication as prescribed: Yes Tolerating medication: Yes   Family/Significant other contact made:   Patient understands diagnosis:  Yes Discussing patient identified problems/goals with staff:  Yes Medical problems stabilized or resolved:  Yes Denies suicidal/homicidal ideation: No Issues/concerns per patient self-inventory:  Yes  Depression at 5  Continues to endorse SI "on and off" Other:  New problem(s) identified: N/A  Reason for Continuation of Hospitalization: Depression Medication stabilization Suicidal ideation  Interventions implemented related to continuation of hospitalization:  Add Geodon for mood,  Taper klonopin  Insist on group attendance and participation  Additional comments:  Estimated length of stay: 2-3 days  Discharge Plan: return home, follow up outpt  New goal(s): N/A  Review of initial/current patient goals per problem list:   1.  Goal(s):Eliminate SI  Met:  No  Target date:11/11  As evidenced UJ:WJXB report  2.  Goal (s):Stabilize mood  Met:  No  Target date:11/11  As evidenced JY:NWGNF will rate his depression and anxiety at a 4 or less  3.  Goal(s):Identify comprehensive mental wellness/sobriety plan  Met:  Yes  Target date:11/8  As evidenced AO:ZHYQM will follow up with [facility],  [facility] and [facility].  4.  Goal(s):  Met:  No  Target date:  As evidenced by:  Attendees: Patient:  [name] [name] 04/12/2012 4:21 PM  Family:     Physician: Dr Daleen Bo 04/12/2012 4:21 PM   Nursing:    04/12/2012 4:21 PM   Clinical Social Worker:  Richelle Ito 04/12/2012 4:21 PM   Extender:   04/12/2012 4:21 PM   Other:     Other:     Other:     Other:      Scribe for Treatment Team:   Ida Rogue, 04/12/2012 4:21 PM

## 2012-04-12 NOTE — Progress Notes (Signed)
D: Patient cooperative with staff and peers. Patient reported on self inventory sheet that his energy level is low, but ability to pay attention is improving. He rated depression and feelings of hopelessness "7".   A: Support and encouragement provided to patient. Scheduled medications administered per MD orders. Monitor 15 minute observation for safety.   R: Patient receptive. Denies SI/HI/AVH. Patient remains safe.

## 2012-04-12 NOTE — Discharge Planning (Signed)
[name] [name] was personally invited to attend AM group.  He declined, and staff was not able to get him out of bed.  He later admitted that he is not a morning person.  We tried to call his sister today.  No answer.  I left a second message for her to call.  He states that even though he was not staying there prior to admission, he knows he can return there because she has stated in the past that he can come back anytime.  He appears unconcerned about this issue.

## 2012-04-12 NOTE — Social Work (Signed)
[facility] Group Notes:  (Counselor/Nursing/MHT/Case Management/Adjunct)       Feelings Around Relapse    04/12/2012 3:44 PM   Type of Therapy: Group  Participation Level:  Limited  Participation Quality:  Attentive  Affect:  Appropriate  Cognitive:  Alert and Appropriate  Insight:   Limited Engagement in Group:  Limited  Engagement in Therapy: Limited Modes of Intervention:  Education, Problem-solving, Support and Exploration  Summary of Progress/Problems:  [name] shared he has to stop hanging with old friends.  He stated he needs to get his life together for his son.   [name] 04/12/2012 3:44 PM

## 2012-04-12 NOTE — Progress Notes (Signed)
Psychoeducational Group Note  Date:  04/12/2012 Time:  10:00AM  Group Topic/Focus:  Relapse Prevention Planning:   The focus of this group is to define relapse and discuss the need for planning to combat relapse.  Participation Level:  Did Not Attend   [name] [name] 04/12/2012, 11:47 AM

## 2012-04-13 DIAGNOSIS — F329 Major depressive disorder, single episode, unspecified: Secondary | ICD-10-CM

## 2012-04-13 DIAGNOSIS — F29 Unspecified psychosis not due to a substance or known physiological condition: Secondary | ICD-10-CM

## 2012-04-13 DIAGNOSIS — F3289 Other specified depressive episodes: Secondary | ICD-10-CM

## 2012-04-13 NOTE — Progress Notes (Signed)
Goals Group Note  Date:  04/13/2012 Time: 0900  Group Topic/Focus:  Identifying Goals : The focus of this group is to help the patients identify something positive in their lives, to introduce them to their Daily Workbooks, and to help them identify and begin to work on what they want to change in their lives. Participation Level:  active Participation Quality: good Affect: flat Cognitive:  limited  Insight:  poor  Engagement in Group: limited  Additional Comments:   PD RN BC [phone]

## 2012-04-13 NOTE — Progress Notes (Signed)
D) Pt rates his depression and hopelessness both at a 7 and states that he is having suicidal thoughts. Verbalizes that he wants to die but is willing to contract for safety. Laughs and jokes in the dayroom with his peers and plays cards. Has attended the groups but will fall asleep. Went downstairs and played basketball. Came to the med room after eating dinner and stated that he was having chest pains.  A) Pt sat at the med window until he was able to say the pains had passed. Presently is in the dayroom laughing and joking with his peers. Given support when appropriate. R) Contracting for safety. Denies pain now.

## 2012-04-13 NOTE — Progress Notes (Signed)
[facility] Group Notes:  (Counselor/Nursing/MHT/Case Management/Adjunct)  04/13/2012 12:56 AM  Type of Therapy:  Psychoeducational Skills  Participation Level:  Minimal  Participation Quality:  Inattentive  Affect:  Depressed  Cognitive:  Appropriate  Insight:  Limited  Engagement in Group:  Limited  Engagement in Therapy:  Limited  Modes of Intervention:  Education  Summary of Progress/Problems: The patient described his day as being "ok". He verbalized that he was able to get more rest today. He also mentioned that he continues to experience suicidal thoughts and that they are at the same frequency as yesterday. His goal for tomorrow is to work on his suicidal thoughts.    [name] S 04/13/2012, 12:56 AM

## 2012-04-13 NOTE — Progress Notes (Signed)
Psychoeducational Group Note  Date: 04/13/2012 Time:  1015  Group Topic/Focus:  Identifying Needs:   The focus of this group is to help patients identify their personal needs that have been historically problematic and identify healthy behaviors to address their needs.  Participation Level:  Minimal  Participation Quality:  Drowsy  Affect:  Blunted  Cognitive:  Oriented  Insight:  Limited  Engagement in Group:  Limited  Additional Comments:    Dione Housekeeper

## 2012-04-13 NOTE — Progress Notes (Signed)
[facility] Group Notes:  (Counselor/Nursing/MHT/Case Management/Adjunct)  04/13/2012 4:19 PM  Type of Therapy:  Group Therapy  Participation Level:  Did Not Attend    [name] [name] 04/13/2012, 4:19 PM 

## 2012-04-13 NOTE — Progress Notes (Signed)
Patient ID: [name] [name], male   DOB: 12-02-1980, 31 y.o.   MRN: [id number]  D:  Patient appears sad and depressed. .Pt said he had SI earlier but will contract for safety, Pt has HI for person he lives with, but has no means to access the person at this time. Pt complained of pain in wrist 8 out of 10. Pt goal is to work on getting thoughts out of his head.    A: Safety  maintained with Q15 min  checks. Support and encouragement provided. Pt told to elevate and rest wrist.  R: Patient remains safe. He is complaint with medication. Safety has been maintained Q15 and continue current POC. Pt states wrist feels better, he wouldn't need medication.

## 2012-04-13 NOTE — Progress Notes (Signed)
Psychoeducational Group Note  Date:  04/13/2012 Time:  1315  Group Topic/Focus:  Coping With Mental Health Crisis:   The purpose of this group is to help patients identify strategies for coping with mental health crisis.  Group discusses possible causes of crisis and ways to manage them effectively.  Participation Level:  Did Not Attend  Participation Quality:    Affect:    Cognitive:    Insight:    Engagement in Group:    Additional Comments:  none  [name] 04/13/2012, 6:27 PM

## 2012-04-13 NOTE — Progress Notes (Signed)
[facility] MD Progress Note  04/13/2012 3:15 PM [name] [name]  MRN:  [id number]  Diagnosis:   Axis I: Bereavement and Major Depression, Recurrent severe Axis II: Deferred Axis III:  Past Medical History  Diagnosis Date  . Mental disorder   . Hypertension   . Depression   . Bipolar 1 disorder   . Irregular heart rate   . Asthma    Subjective: [name] was asleep in his bed this afternoon, and he woke up with some verbal prompts. He reports he is doing okay. He endorses good sleep last night, and his appetite is good. He denies any suicidal or homicidal ideation. He denies any auditory or visual hallucinations. When asked if he were depressed he stated "I don't know."   ADL's:  Intact  Sleep: Good  Appetite:  Good  Suicidal Ideation:  Patient denies any thought, plan, or intent Homicidal Ideation:  Patient denies any thought, plan, or intent  AEB (as evidenced by):  Mental Status Examination/Evaluation: Objective:  Appearance: Bizarre  Eye Contact::  Fair  Speech:  Clear and Coherent  Volume:  Normal  Mood:  Anxious and Depressed  Affect:  Congruent  Thought Process:  Disorganized  Orientation:  Full  Thought Content:  WDL  Suicidal Thoughts:  No  Homicidal Thoughts:  No  Memory:  Immediate;   Good Recent;   Good Remote;   Good  Judgement:  Impaired  Insight:  Lacking  Psychomotor Activity:  Normal  Concentration:  Good  Recall:  Good  Akathisia:  No  Handed:    AIMS (if indicated):     Assets:    Sleep:  Number of Hours: 6.75    Vital Signs:Blood pressure 90/60, pulse 106, temperature 97.5 F (36.4 C), temperature source Oral, resp. rate 18, height 6\' 1"  (1.854 m), weight 127.007 kg (280 lb). Current Medications: Current Facility-Administered Medications  Medication Dose Route Frequency Provider Last Rate Last Dose  . acetaminophen (TYLENOL) tablet 650 mg  650 mg Oral Q6H PRN Kerry Hough, PA   650 mg at 04/09/12 1443  . albuterol (PROVENTIL  HFA;VENTOLIN HFA) 108 (90 BASE) MCG/ACT inhaler 2 puff  2 puff Inhalation Q4H PRN Kerry Hough, PA      . albuterol (PROVENTIL HFA;VENTOLIN HFA) 108 (90 BASE) MCG/ACT inhaler 2 puff  2 puff Inhalation Q6H PRN Himabindu Ravi, MD      . alum & mag hydroxide-simeth (MAALOX/MYLANTA) 200-200-20 MG/5ML suspension 30 mL  30 mL Oral Q4H PRN Kerry Hough, PA      . clonazePAM (KLONOPIN) tablet 0.5 mg  0.5 mg Oral BID Himabindu Ravi, MD   0.5 mg at 04/13/12 0825  . gabapentin (NEURONTIN) capsule 300 mg  300 mg Oral TID Himabindu Ravi, MD   300 mg at 04/13/12 1325  . ibuprofen (ADVIL,MOTRIN) tablet 400 mg  400 mg Oral Q6H PRN Himabindu Ravi, MD      . magnesium hydroxide (MILK OF MAGNESIA) suspension 30 mL  30 mL Oral Daily PRN Kerry Hough, PA      . PARoxetine (PAXIL) tablet 40 mg  40 mg Oral Daily Himabindu Ravi, MD   40 mg at 04/13/12 0825  . QUEtiapine (SEROQUEL) tablet 400 mg  400 mg Oral QHS Himabindu Ravi, MD   400 mg at 04/12/12 2246  . silver sulfADIAZINE (SILVADENE) 1 % cream   Topical BID Kerry Hough, PA      . traZODone (DESYREL) tablet 50 mg  50 mg Oral QHS,MR X  1 Kerry Hough, PA   50 mg at 04/12/12 2246  . [COMPLETED] ziprasidone (GEODON) capsule 20 mg  20 mg Oral BID Shuvon Rankin, NP   20 mg at 04/13/12 1027    Lab Results: No results found for this or any previous visit (from the past 48 hour(s)).  Physical Findings: AIMS: Facial and Oral Movements Muscles of Facial Expression: None, normal Lips and Perioral Area: None, normal Jaw: None, normal Tongue: None, normal,Extremity Movements Upper (arms, wrists, hands, fingers): None, normal Lower (legs, knees, ankles, toes): None, normal, Trunk Movements Neck, shoulders, hips: None, normal, Overall Severity Severity of abnormal movements (highest score from questions above): None, normal Incapacitation due to abnormal movements: None, normal Patient's awareness of abnormal movements (rate only patient's report): No  Awareness, Dental Status Current problems with teeth and/or dentures?: No Does patient usually wear dentures?: No  CIWA:  CIWA-Ar Total: 0  COWS:  COWS Total Score: 1   Treatment Plan Summary: Daily contact with patient to assess and evaluate symptoms and progress in treatment Medication management  Plan: Continue his current plan of care, and allow time for medications to become effective.  Nomie Buchberger 04/13/2012, 3:15 PM

## 2012-04-13 NOTE — Progress Notes (Signed)
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 Mental disorder   . Hypertension   . Depression   . Bipolar 1 disorder   . Irregular heart rate   . Asthma    Axis IV: economic problems, other psychosocial or environmental problems, problems related to social environment and problems with primary support group Axis V: 41-50 serious symptoms  ADL's:  Intact  Sleep: Fair  Appetite:  Good  Suicidal Ideation:  Passive, contracts for safety Homicidal Ideation:  Denies  Mental Status Examination/Evaluation: Objective:  Appearance: Disheveled  Eye Contact::  Fair  Speech:  Normal Rate  Volume:  Normal  Mood:  Depressed  Affect:  Depressed  Thought Process:  Intact and Logical  Orientation:  Full  Thought Content:  WDL  Suicidal Thoughts:  Yes.  without intent/plan  Homicidal Thoughts:  No  Memory:  Immediate;   Fair Recent;   Fair Remote;   Fair  Judgement:  Impaired  Insight:  Fair  Psychomotor Activity:  Decreased  Concentration:  Fair  Recall:  Fair  Akathisia:  No  Handed:  Right  AIMS (if indicated):     Assets:  Desire for Improvement Physical Health  Sleep:  Number of Hours: 6.75    Vital Signs:Blood pressure 90/60, pulse 106, temperature 97.5 F (36.4 C), temperature source Oral, resp. rate 18, height 6\' 1"  (1.854 m), weight 127.007 kg (280 lb). Current Medications: Current Facility-Administered Medications  Medication Dose Route Frequency Provider Last Rate Last Dose  . acetaminophen (TYLENOL) tablet 650 mg  650 mg Oral Q6H PRN Kerry Hough, PA   650 mg at 04/09/12 1443  . albuterol (PROVENTIL HFA;VENTOLIN HFA) 108 (90 BASE) MCG/ACT inhaler 2 puff  2 puff Inhalation Q4H PRN Kerry Hough, PA      . albuterol (PROVENTIL HFA;VENTOLIN HFA) 108 (90 BASE) MCG/ACT inhaler 2 puff  2  puff Inhalation Q6H PRN Himabindu Ravi, MD      . alum & mag hydroxide-simeth (MAALOX/MYLANTA) 200-200-20 MG/5ML suspension 30 mL  30 mL Oral Q4H PRN Kerry Hough, PA      . clonazePAM (KLONOPIN) tablet 0.5 mg  0.5 mg Oral BID Himabindu Ravi, MD   0.5 mg at 04/13/12 0825  . gabapentin (NEURONTIN) capsule 300 mg  300 mg Oral TID Himabindu Ravi, MD   300 mg at 04/13/12 1325  . ibuprofen (ADVIL,MOTRIN) tablet 400 mg  400 mg Oral Q6H PRN Himabindu Ravi, MD      . magnesium hydroxide (MILK OF MAGNESIA) suspension 30 mL  30 mL Oral Daily PRN Kerry Hough, PA      . PARoxetine (PAXIL) tablet 40 mg  40 mg Oral Daily Himabindu Ravi, MD   40 mg at 04/13/12 0825  . QUEtiapine (SEROQUEL) tablet 400 mg  400 mg Oral QHS Himabindu Ravi, MD   400 mg at 04/12/12 2246  . silver sulfADIAZINE (SILVADENE) 1 % cream   Topical BID Kerry Hough, PA      . traZODone (DESYREL) tablet 50 mg  50 mg Oral QHS,MR X 1 Kerry Hough, PA   50 mg at 04/12/12 2246  . [COMPLETED] ziprasidone (GEODON) capsule 20 mg  20 mg Oral BID Shuvon Rankin, NP   20 mg at 04/13/12 4696    Lab Results: No results  found for this or any previous visit (from the past 48 hour(s)).  Physical Findings: AIMS: Facial and Oral Movements Muscles of Facial Expression: None, normal Lips and Perioral Area: None, normal Jaw: None, normal Tongue: None, normal,Extremity Movements Upper (arms, wrists, hands, fingers): None, normal Lower (legs, knees, ankles, toes): None, normal, Trunk Movements Neck, shoulders, hips: None, normal, Overall Severity Severity of abnormal movements (highest score from questions above): None, normal Incapacitation due to abnormal movements: None, normal Patient's awareness of abnormal movements (rate only patient's report): No Awareness, Dental Status Current problems with teeth and/or dentures?: No Does patient usually wear dentures?: No  CIWA:  CIWA-Ar Total: 0  COWS:  COWS Total Score: 1   Treatment Plan  Summary: Daily contact with patient to assess and evaluate symptoms and progress in treatment Medication management  Plan:  Individual and group therapy, reports suicidal thoughts "come and go" but contracts for safety, engages easily, medication management, monitoring continues  LORD, Catha Nottingham, NP 04/13/2012, 3:11 PM

## 2012-04-14 DIAGNOSIS — F332 Major depressive disorder, recurrent severe without psychotic features: Principal | ICD-10-CM

## 2012-04-14 DIAGNOSIS — Z634 Disappearance and death of family member: Secondary | ICD-10-CM

## 2012-04-14 MED ORDER — TRAZODONE HCL 100 MG PO TABS
100.0000 mg | ORAL_TABLET | Freq: Every evening | ORAL | Status: DC | PRN
  Administered 2012-04-14 – 2012-04-15 (×2): 100 mg via ORAL
  Filled 2012-04-14 (×8): qty 1

## 2012-04-14 MED ORDER — GABAPENTIN 400 MG PO CAPS
400.0000 mg | ORAL_CAPSULE | Freq: Four times a day (QID) | ORAL | Status: DC
  Administered 2012-04-14 – 2012-04-15 (×5): 400 mg via ORAL
  Filled 2012-04-14 (×8): qty 1

## 2012-04-14 NOTE — Progress Notes (Signed)
[facility] MD Progress Note  04/14/2012 11:19 AM [name] [name]  MRN:  [id number]  Diagnosis:   Axis I: Bereavement and Major Depression, Recurrent severe Axis II: Deferred Axis III:  Past Medical History  Diagnosis Date  . Mental disorder   . Hypertension   . Depression   . Bipolar 1 disorder   . Irregular heart rate   . Asthma    Subjective: [name] [name] feels that he is still worthless and hopeless.     ADL's:  Intact  Sleep: Good  Appetite:  Good  Suicidal Ideation:  Patient denies any thought, plan, or intent Homicidal Ideation:  Patient denies any thought, plan, or intent  AEB (as evidenced by):  Mental Status Examination/Evaluation: Objective:  Appearance: Bizarre  Eye Contact::  Fair  Speech:  Clear and Coherent  Volume:  Normal  Mood:  Anxious and Depressed  Affect:  Congruent  Thought Process:  Disorganized  Orientation:  Full  Thought Content:  WDL  Suicidal Thoughts:  No  Homicidal Thoughts:  No  Memory:  Immediate;   Good Recent;   Good Remote;   Good  Judgement:  Impaired  Insight:  Lacking  Psychomotor Activity:  Normal  Concentration:  Good  Recall:  Good  Akathisia:  No  Handed:    AIMS (if indicated):     Assets:    Sleep:  Number of Hours: 6.25    Vital Signs:Blood pressure 96/59, pulse 105, temperature 98.2 F (36.8 C), temperature source Oral, resp. rate 16, height 6\' 1"  (1.854 m), weight 127.007 kg (280 lb). Current Medications: Current Facility-Administered Medications  Medication Dose Route Frequency Provider Last Rate Last Dose  . acetaminophen (TYLENOL) tablet 650 mg  650 mg Oral Q6H PRN Kerry Hough, PA   650 mg at 04/09/12 1443  . albuterol (PROVENTIL HFA;VENTOLIN HFA) 108 (90 BASE) MCG/ACT inhaler 2 puff  2 puff Inhalation Q4H PRN Kerry Hough, PA      . albuterol (PROVENTIL HFA;VENTOLIN HFA) 108 (90 BASE) MCG/ACT inhaler 2 puff  2 puff Inhalation Q6H PRN Himabindu Ravi, MD   2 puff at 04/13/12 2157  . alum & mag  hydroxide-simeth (MAALOX/MYLANTA) 200-200-20 MG/5ML suspension 30 mL  30 mL Oral Q4H PRN Kerry Hough, PA      . clonazePAM Scarlette Calico) tablet 0.5 mg  0.5 mg Oral BID Himabindu Ravi, MD   0.5 mg at 04/14/12 0807  . gabapentin (NEURONTIN) capsule 300 mg  300 mg Oral TID Himabindu Ravi, MD   300 mg at 04/14/12 0807  . ibuprofen (ADVIL,MOTRIN) tablet 400 mg  400 mg Oral Q6H PRN Himabindu Ravi, MD      . magnesium hydroxide (MILK OF MAGNESIA) suspension 30 mL  30 mL Oral Daily PRN Kerry Hough, PA      . PARoxetine (PAXIL) tablet 40 mg  40 mg Oral Daily Himabindu Ravi, MD   40 mg at 04/14/12 0807  . QUEtiapine (SEROQUEL) tablet 400 mg  400 mg Oral QHS Himabindu Ravi, MD   400 mg at 04/13/12 2156  . silver sulfADIAZINE (SILVADENE) 1 % cream   Topical BID Kerry Hough, PA      . traZODone (DESYREL) tablet 50 mg  50 mg Oral QHS,MR X 1 Kerry Hough, PA   50 mg at 04/13/12 2156    Lab Results: No results found for this or any previous visit (from the past 48 hour(s)).  Physical Findings: AIMS: Facial and Oral Movements Muscles of Facial Expression: None,  normal Lips and Perioral Area: None, normal Jaw: None, normal Tongue: None, normal,Extremity Movements Upper (arms, wrists, hands, fingers): None, normal Lower (legs, knees, ankles, toes): None, normal, Trunk Movements Neck, shoulders, hips: None, normal, Overall Severity Severity of abnormal movements (highest score from questions above): None, normal Incapacitation due to abnormal movements: None, normal Patient's awareness of abnormal movements (rate only patient's report): No Awareness, Dental Status Current problems with teeth and/or dentures?: No Does patient usually wear dentures?: No  CIWA:  CIWA-Ar Total: 0  COWS:  COWS Total Score: 1   Treatment Plan Summary: Daily contact with patient to assess and evaluate symptoms and progress in treatment Medication management  Plan: Stop Klonopin to stop some of his  depression Increase Neurontin for better management of anxiety without causing depression Have pt focus on what he has instead of what he has lost to get him out of the negative LOSS mentality.  [name] [name] 04/14/2012, 11:19 AM

## 2012-04-14 NOTE — Progress Notes (Signed)
Goals Group Note  Date:  04/14/2012 Time: 0900  Group Topic/Focus:  The group focuses on helping the patients identify something they are thankful for, introduces the Sunday Patient Workbook and helps motivate the patients to develop and begin to practice healthier coping mechanisms. IParticipation Level:  active Participation Quality: good Affect: flat Cognitive:  limited  Insight:  good  Engagement in Group: engaged  Additional Comments:   PD RN BC [id number]

## 2012-04-14 NOTE — Progress Notes (Signed)
[facility] Group Notes:  (Counselor/Nursing/MHT/Case Management/Adjunct)  04/14/2012 12:50 AM  Type of Therapy:  Psychoeducational Skills  Participation Level:  Minimal  Participation Quality:  inattentive  Affect:  Depressed  Cognitive:  Appropriate  Insight:  Good  Engagement in Group:  Limited  Engagement in Therapy:  Limited  Modes of Intervention:  Education  Summary of Progress/Problems: The patient stated that he is dealing with the same issues as yesterday, ie. Suicidal thoughts. He also claims that he experienced chest pain near dinner time and became nauseated. His goal for tomorrow is to get better.    [name] [name] S 04/14/2012, 12:50 AM

## 2012-04-14 NOTE — Progress Notes (Signed)
I agree with this note.  

## 2012-04-14 NOTE — Clinical Social Work Note (Signed)
[facility] Group Notes:  (Clinical Social Work)  04/14/2012   3:00-4:00PM  Summary of Progress/Problems:   The main focus of today's process group was for the patient to identify their current support system and decide on other supports that can be put in place to prevent future hospitalizations.  An emphasis was placed on using therapist, doctor and problem-specific support groups to expand supports. The patient expressed that he does not have a support system at this time, and that he tends to isolate.  He preferred not to share details with the group.  Type of Therapy:  Group Therapy  Participation Level:  Minimal  Participation Quality:  Attentive  Affect:  Anxious and Depressed  Cognitive:  Appropriate and Oriented  Insight:  Limited  Engagement in Group:  Limited  Engagement in Therapy:  Limited  Modes of Intervention:  Clarification, Education, Limit-setting, Problem-solving, Socialization, Support and Processing   Ambrose Mantle, LCSW 04/14/2012, 5:05 PM

## 2012-04-14 NOTE — Progress Notes (Signed)
Psychoeducational Group Note  Date:  04/14/2012 Time:  2000  Group Topic/Focus:  Wrap-Up Group:   The focus of this group is to help patients review their daily goal of treatment and discuss progress on daily workbooks.  Participation Level:  Active  Participation Quality:  Appropriate  Affect:  Appropriate  Cognitive:  Appropriate  Insight:  Limited  Engagement in Group:  Good  Additional Comments:  Pt attended wrap-up group this evening. Pt participated in group but had little to share with group. Pt states his ready for discharge.   [name] [name] A 04/14/2012, 11:06 PM

## 2012-04-14 NOTE — Progress Notes (Signed)
D) Pt has attended the groups and interacts with select peers. Rates his depression and hopelessness both at a 7 and continues to have thoughts of SI. Is limited with his responses and his understanding. Does interact well with his peers. A) Given support when appropriate. Encouraged to do his packet that he was given  R) Pt did not do his packet. Sits in the dayroom and plays cards and will laugh occasionally with his peers.

## 2012-04-14 NOTE — Progress Notes (Signed)
D: Pt continues to endorse SI, denies plan, is contracting for safety.  Denies HI.  Reports occasion AH discussing killing himself and others but states he will not harm anyone on unit.  Pt told this RN he "was sick at dinner" and couldn't eat anything, had chest pain, dizziness.  Staff gave Pt gatorade and he rested until felt better.  On evening shift, denies chest pain although says he is "a little tight" from asthma; able to eat salad at 2030 and given ginger ale.  Pt's affect is blunted, mood labile; however, he is pleasant and polite when approached.   Visible on unit, attended milieu groups.  See sticky note regarding Albuteral inhaler orders.  A: Pt encouraged to "be up front" with staff re: SI/HI, AVH.  Given HS meds according to med orders and POC, as well as PRN Albuterol inhaler 2 puffs @ 2157.  Maintained on Q15 minute safety checks as per unit protocol.  R: Pt generally cooperative despite incongruity of thought content and behavior.  Safety maintained. Dion Saucier RN

## 2012-04-14 NOTE — Progress Notes (Signed)
Psychoeducational Group Note  Date:  04/14/2012 Time: 1015 Group Topic/Focus:  Making Healthy Choices:   The focus of this group is to help patients identify negative/unhealthy choices they were using prior to admission and identify positive/healthier coping strategies to replace them upon discharge.  Participation Level:  Active  Participation Quality:  Attentive  Affect:  Depressed  Cognitive:  Alert  Insight:  Good  Engagement in Group:  Limited  Additional Comments:    Rich Brave 4:37 PM. 04/14/2012

## 2012-04-15 MED ORDER — GABAPENTIN 300 MG PO CAPS
300.0000 mg | ORAL_CAPSULE | Freq: Two times a day (BID) | ORAL | Status: DC
  Administered 2012-04-15 – 2012-04-16 (×2): 300 mg via ORAL
  Filled 2012-04-15 (×6): qty 1

## 2012-04-15 NOTE — Progress Notes (Signed)
D: Pt visible on unit for most of evening, attended groups.  Interacts with peers to extent he is able and spend time playing games in dayroom.  Interaction with staff is attention-seeking, manipulative, and verbally evasive (see below).  Pt continues to endorse passive SI, is contracting for safety on unit.  Denies HI, AVH.  Eye contact is minimal with blunted affect, mood and facial expression anxious.  Pt appears lethargic and rarely moves about the unit unless required for meals or to go to room.  At approximately 2040 this evening, this RN was notified that Pt was complaining of chest pain, numbness in neck and arms, and excruciating pain.  Pt was immediately evaluated by myself and another [address] RN: VS 98.34F, BP 123/72, HR (manual) 113 and regular), SaO2 98%.  Facial color pink, hands warm with immediate capillary refill in fingertips; no diaphoresis.  Pt stated that chest pain felt like "someone is jumping up and down on my chest" and that it was radiating down his RIGHT arm.  Also stated that R arm and neck were numb and he was unable to move arm (however, he raised R hand several inches to allow me to attach oxygen saturation monitor).  Pt has history of hypertention and "irregular heart beat".  Similar symptoms were described as occurring during the previous shift with the Pt ultimately becoming distracted by laughter and forgetting about his complaints.  However, due to risk factors, Verne Spurr PA was contacted for consult; she requested further neuro testing and clarification of chest pain symptoms.  Pt was again approached; stated pain was 10/10, then changed to 8/10.  Neuro testing of arms and hands with blunt and sharp objects indicated deception when Pt identified objects wrongly 5/5 times on "unaffected" arm and 3/5 times on "affected" arm.  Pt was also able to correctly indicate with eyes closed how many fingers on the "affected" hand were being manipulated over several trials.  Consulting civil engineer and  this RN suggested to Pt that his VS and physical appearance looked like there might be something other than a heart attack going on.  When asked if he could have damaged R arm in any other way, Pt state "I fell on my R arm hard a bunch of times playing basketball in the gym" and did not mention chest pain as a symptom to the charge nurse.  It was collectively decided to move Pt to his bedroom to rest and let his muscles relax with warm pack; he agreed and walked to room without difficulty.  It was also relayed to staff around that time that two other patients were planning an assignation on the unit and this Pt was supposed to be their lookout; the question was raised of whether this might have been intended as a staff diversion.  Pt was tucked into bed with warm pack, given Ibuprofen for muscle pain, and fell asleep shortly with no further complaints; monitored frequently by nursing staff as well as on normal Q15 minute safety checks.  A: Pt was given positive reinforcement for following staff guidance and taking care of his health by returning to his room to rest.  Received Ibuprofen 400mg  PO for pain (8/10) at 2117 (in arm/neck).  Pt was asleep at PRN pain recheck.  Medications administered according to med orders and POC.  Pt monitored both by assigned RN and by MHT assigned to maintain Q15 minute safety checks as per unit protocol.  R: Pt appeared relieved to leave dayroom and  settled quickly into bed, raising question of whether he may have been somehow pressured into providing cover for meeting between other patients.  (Meeting prevented)  Comfortable enough once in bed to sleep and has not awoken.  Safety maintained.  Will continue to monitor. Dion Saucier RN

## 2012-04-15 NOTE — Progress Notes (Signed)
Psychoeducational Group Note  Date:  04/15/2012 Time: 1100  Group Topic/Focus:  Wellness Toolbox:   The focus of this group is to discuss various aspects of wellness, balancing those aspects and exploring ways to increase the ability to experience wellness.  Patients will create a wellness toolbox for use upon discharge.  Participation Level:  Active  Participation Quality:  Appropriate and Attentive  Affect:  Appropriate  Cognitive:  Alert and Appropriate  Insight:  Good  Engagement in Group:  Good  Additional Comments:  Pt. Listened attentively, shared and was engaged in group.  Ruta Hinds [facility] 04/15/2012, 6:27 PM

## 2012-04-15 NOTE — Progress Notes (Signed)
[facility] MD Progress Note  04/15/2012 12:29 PM [name] [name]  MRN:  [id number]  S: Patient continues to report feeling depressed and worthless. Tolerating the Neurontin.  Diagnosis:   Axis I: Alcohol Abuse and Depressive Disorder NOS Axis II: No diagnosis Axis III:  Past Medical History  Diagnosis Date  . Mental disorder   . Hypertension   . Depression   . Bipolar 1 disorder   . Irregular heart rate   . Asthma    Axis IV: occupational problems Axis V: 51-60 moderate symptoms  ADL's:  Intact  Sleep: Fair  Appetite:  Fair  Mental Status Examination/Evaluation: Objective:  Appearance: Casual  Eye Contact::  Fair  Speech:  Clear and Coherent  Volume:  Normal  Mood:  Anxious and Depressed  Affect:  Blunt  Thought Process:  Coherent  Orientation:  Full  Thought Content:  WDL  Suicidal Thoughts:  No  Homicidal Thoughts:  No  Memory:  Immediate;   Fair Recent;   Fair Remote;   Fair  Judgement:  Impaired  Insight:  Shallow  Psychomotor Activity:  Normal  Concentration:  Fair  Recall:  Fair  Akathisia:  No  Handed:  Right  AIMS (if indicated):     Assets:  Desire for Improvement  Sleep:  Number of Hours: 6.75    Vital Signs:Blood pressure 106/71, pulse 83, temperature 97.3 F (36.3 C), temperature source Oral, resp. rate 18, height 6\' 1"  (1.854 m), weight 127.007 kg (280 lb). Current Medications: Current Facility-Administered Medications  Medication Dose Route Frequency Provider Last Rate Last Dose  . acetaminophen (TYLENOL) tablet 650 mg  650 mg Oral Q6H PRN Kerry Hough, PA   650 mg at 04/09/12 1443  . albuterol (PROVENTIL HFA;VENTOLIN HFA) 108 (90 BASE) MCG/ACT inhaler 2 puff  2 puff Inhalation Q4H PRN Kerry Hough, PA   2 puff at 04/15/12 0948  . alum & mag hydroxide-simeth (MAALOX/MYLANTA) 200-200-20 MG/5ML suspension 30 mL  30 mL Oral Q4H PRN Kerry Hough, PA      . gabapentin (NEURONTIN) capsule 400 mg  400 mg Oral QID Mike Craze, MD   400 mg  at 04/15/12 1207  . ibuprofen (ADVIL,MOTRIN) tablet 400 mg  400 mg Oral Q6H PRN Ryenn Howeth, MD   400 mg at 04/14/12 2117  . magnesium hydroxide (MILK OF MAGNESIA) suspension 30 mL  30 mL Oral Daily PRN Kerry Hough, PA      . PARoxetine (PAXIL) tablet 40 mg  40 mg Oral Daily Hue Steveson, MD   40 mg at 04/15/12 0834  . QUEtiapine (SEROQUEL) tablet 400 mg  400 mg Oral QHS Ermias Tomeo, MD   400 mg at 04/14/12 2116  . silver sulfADIAZINE (SILVADENE) 1 % cream   Topical BID Kerry Hough, PA      . traZODone (DESYREL) tablet 100 mg  100 mg Oral QHS,MR X 1 Mike Craze, MD   100 mg at 04/14/12 2116    Lab Results: No results found for this or any previous visit (from the past 48 hour(s)).  Physical Findings: AIMS: Facial and Oral Movements Muscles of Facial Expression: None, normal Lips and Perioral Area: None, normal Jaw: None, normal Tongue: None, normal,Extremity Movements Upper (arms, wrists, hands, fingers): None, normal Lower (legs, knees, ankles, toes): None, normal, Trunk Movements Neck, shoulders, hips: None, normal, Overall Severity Severity of abnormal movements (highest score from questions above): None, normal Incapacitation due to abnormal movements: None, normal Patient's awareness of  abnormal movements (rate only patient's report): No Awareness, Dental Status Current problems with teeth and/or dentures?: No Does patient usually wear dentures?: No  CIWA:  CIWA-Ar Total: 0  COWS:  COWS Total Score: 1   Treatment Plan Summary: Daily contact with patient to assess and evaluate symptoms and progress in treatment Medication management  Plan: Decrease Neurontin to 300mg  po bid. Plan for discharge tomorrow.  [name] [name] 04/15/2012, 12:29 PM

## 2012-04-15 NOTE — Progress Notes (Cosign Needed)
Psychoeducational Group Note  Date:  04/15/2012 Time:  1:15  Group Topic/Focus:  Overcoming Obstacles to Wellness  Participation Level:  Minimal  Participation Quality:  Drowsy  Affect:  Inappropriate  Cognitive:  Appropriate  Insight:  Limited  Engagement in Group:  Limited  Additional Comments:  [name] [name] participated somewhat in group, but at times held side conversations while others were talking, slept, or giggled while discussing depressive symptoms. His goal was to decrease his suicidal ideation so he could see and play basketball with his son again.  [name] 04/15/2012, 2:46 PM

## 2012-04-15 NOTE — Progress Notes (Signed)
D:  Patient's self inventory sheet, patient sleeps well, has good appetite, low energy level, improving attention span.   Rated depression and hopelessness #8.  Denied withdrawals.  SI, contracts for safety.  Has felt lightheaded, pain, dizzy, headache in past 24 hours.  Pain goal #8, worst pain #8.   A:  Medications given per MD order.  Support and encouragement given throughout day.  Support and safety checks completed as ordered. R:  Following treatment plan .  Denied SI and HI.   Denied A/V hallucinations.  Patient remains safe and receptive on unit.

## 2012-04-16 MED ORDER — IBUPROFEN 400 MG PO TABS: 400.0000 mg | ORAL_TABLET | Freq: Four times a day (QID) | ORAL | Status: DC | PRN

## 2012-04-16 MED ORDER — QUETIAPINE FUMARATE 400 MG PO TABS: 400.0000 mg | ORAL_TABLET | Freq: Every day | ORAL | Status: DC

## 2012-04-16 MED ORDER — PAROXETINE HCL 40 MG PO TABS: 40.0000 mg | ORAL_TABLET | Freq: Every day | ORAL | Status: DC

## 2012-04-16 MED ORDER — ALBUTEROL SULFATE HFA 108 (90 BASE) MCG/ACT IN AERS: 2.0000 | INHALATION_SPRAY | Freq: Four times a day (QID) | RESPIRATORY_TRACT | Status: DC | PRN

## 2012-04-16 MED ORDER — SILVER SULFADIAZINE 1 % EX CREA: TOPICAL_CREAM | Freq: Two times a day (BID) | CUTANEOUS | Status: DC

## 2012-04-16 MED ORDER — GABAPENTIN 300 MG PO CAPS: 300.0000 mg | ORAL_CAPSULE | Freq: Two times a day (BID) | ORAL | Status: DC

## 2012-04-16 NOTE — Progress Notes (Deleted)
[facility] Adult Inpatient Family/Significant Other Suicide Prevention Education  Suicide Prevention Education:  Education Completed; Clancy Gourd [phone],  has been identified by the patient as the family member/significant other with whom the patient will be residing, and identified as the person(s) who will aid the patient in the event of a mental health crisis (suicidal ideations/suicide attempt).  With written consent from the patient, the family member/significant other has been provided the following suicide prevention education, prior to the and/or following the discharge of the patient.  The suicide prevention education provided includes the following:  Suicide risk factors  Suicide prevention and interventions  National Suicide Hotline telephone number  [facility] assessment telephone number  [facility] Emergency Assistance 911  [facility] and/or Residential Mobile Crisis Unit telephone number  Request made of family/significant other to:  Remove weapons (e.g., guns, rifles, knives), all items previously/currently identified as safety concern.    Remove drugs/medications (over-the-counter, prescriptions, illicit drugs), all items previously/currently identified as a safety concern.  The family member/significant other verbalizes understanding of the suicide prevention education information provided.  The family member/significant other agrees to remove the items of safety concern listed above.  Nail, Catalina Gravel 04/16/2012, 2:05 PM

## 2012-04-16 NOTE — Progress Notes (Signed)
D:  Patient's self inventory sheet, patient has poor sleep, improving appetite, low energy level, improving attention span.   Rated depression and hopelessness #10.  Denied withdrawals.  SI off/on, contracts for safety.  Has experienced lightheadedness, pain, dizziness, headaches in past 24 hours.  Zero pain goal, worst pain #10.   A:  Medications administered per MD order.  Support and encouragement gvien throughout day.  Support and safety checks completed as ordered. R:  Following treatment plan.  Denied  HI.   Denied A/V hallucinations.  Patient remains safe and receptive on unit.  SI off/on, contracts for safety. Patient looking forward to discharge today.

## 2012-04-16 NOTE — Progress Notes (Signed)
[facility] Case Management Discharge Plan:  Will you be returning to the same living situation after discharge: No. At discharge, do you have transportation home?:Yes,  bus pass Do you have the ability to pay for your medications:Yes,  mental health, community support  Interagency Information:     Release of information consent forms completed and in the chart;  Patient's signature needed at discharge.  Patient to Follow up at:  Follow-up Information    Follow up with [facility]. (Walk in between 8am-9am Monday thru Friday)    Contact information:   [address], [place] [address] [phone] FAX      Follow up with [facility]. (You will receive a letter in the mail in one week with date and time of orientation at above address. If you do not recieve letter in one week, call number provided.)    Contact information:   [address] Kn[address] [place], [place] [address] [phone]      Follow up with [facility]. On 04/26/2012. (2:00 with Brayton Caves  Call them if you need to reschedule.  These guys will provide multiple services to you if that is what you want)    Contact information:   [address] [place], [place] [address] [phone] FAX          Patient denies SI/HI:   Yes,  yes    Safety Planning and Suicide Prevention discussed:  Yes,  yes  Barrier to discharge identified:No.  Summary and Recommendations:   Ida Rogue 04/16/2012, 10:06 AM

## 2012-04-16 NOTE — [facility] Suicide Risk Assessment (Signed)
Suicide Risk Assessment  Discharge Assessment     Demographic Factors:  Male, Caucasian and Low socioeconomic status  Mental Status Per Nursing Assessment::   On Admission:  Suicidal ideation indicated by patient;Belief that plan would result in death  Current Mental Status by Physician: Patient alert and oriented to 4. Denies aH/VH/HI/SI.  Loss Factors: Financial problems/change in socioeconomic status  Historical Factors: Impulsivity  Risk Reduction Factors:   Positive social support  Continued Clinical Symptoms:  Alcohol/Substance Abuse/Dependencies  Cognitive Features That Contribute To Risk:  cognitively intact  Suicide Risk:  Minimal: No identifiable suicidal ideation.  Patients presenting with no risk factors but with morbid ruminations; may be classified as minimal risk based on the severity of the depressive symptoms  Discharge Diagnoses:   AXIS I:  Depressive Disorder NOS AXIS II:  No diagnosis AXIS III:   Past Medical History  Diagnosis Date  . Mental disorder   . Hypertension   . Depression   . Bipolar 1 disorder   . Irregular heart rate   . Asthma    AXIS IV:  economic problems and occupational problems AXIS V:  61-70 mild symptoms  Plan Of Care/Follow-up recommendations:  Activity:  normal Diet:  normal Follow [facility] with [facility] for OPT appointment.  Is patient on multiple antipsychotic therapies at discharge:  No   Has Patient had three or more failed trials of antipsychotic monotherapy by history:  No  Recommended Plan for Multiple Antipsychotic Therapies: NA  Apryl Brymer 04/16/2012, 8:55 AM

## 2012-04-16 NOTE — Progress Notes (Signed)
D:  Pt attended groups but interacted with peers and staff minimally.  Remains cooperative and polite.  Eye contact fair with sad facial expression.  Both affect and mood are depressed and sad.  No evidenced noted or reported of disordered or disorganized thought process.  Pt continues to experience intermittent passive SI (no plan or intent), contracting for safety on unit.  Denies HI, AVH, and acute pain.  States that R shoulder and neck are feeling "much better" after hot packs and warm showers last night and today.  A:  Pt encouraged to socialize but was avoidant of situations requiring conversation.  Support provided to reinforce progress made by patient so far.  Medications administered according to med orders and POC.  Q15 minute safety checks maintained as per unit protocol.  R: Pt minimized socialization/interation throughout shift.  Pt vowing to avoid basketball court indefinitely due to risk of reinjury to shoulder or recurrence of chest pain.  Safety maintained. Dion Saucier RN

## 2012-04-16 NOTE — Discharge Summary (Signed)
Physician Discharge Summary Note  Patient:  [name] [name] is an 31 y.o., male MRN:  [id number] DOB:  1981/04/02 Patient phone:  [phone] (home)  Patient address:   [address] [place] [address],  Date of Admission: 04/08/2012  Date of Discharge: 04/16/2012  Reason for Admission:[name] [name] is an 31 y.o. male who presented voluntarily to [facility] via EMS with suicidal ideation without plan. Pt endorses depressed mood, insomnia, anhedonia, worthlessness, irritability, isolating, and insomnia. Pt has tried to commit suicide twice in past. Pt reports not having slept in 4 days. Pt has had [facility] admission x3 (most recent July 2013), [facility] and most recently at [facility] Oct 2013 for SI and depression. Pt receives med management from [facility]. Pt drinks alcohol less than once a month.  Discharge Diagnoses:  AXIS I: Depressive Disorder NOS  AXIS II: No diagnosis  AXIS III:  Past Medical History   Diagnosis  Date   .  Mental disorder    .  Hypertension    .  Depression    .  Bipolar 1 disorder    .  Irregular heart rate    .  Asthma    AXIS IV: economic problems and occupational problems  AXIS V: 61-70 mild symptoms  Level of Care: OP  Hospital Course: Medications were manipulated for mood stabilization. Patient will discharge on Seroquel, neurontin, and paxil for mood.  Consults: None  Significant Diagnostic Studies: None  Discharge Vitals:  Blood pressure 111/68, pulse 101, temperature 97.4 F (36.3 C), temperature source Oral, resp. rate 18, height 6\' 1"  (1.854 m), weight 127.007 kg (280 lb).  Lab Results:  No results found for this or any previous visit (from the past 72 hour(s)).  Physical Findings:  AIMS: Facial and Oral Movements  Muscles of Facial Expression: None, normal  Lips and Perioral Area: None, normal  Jaw: None, normal  Tongue: None, normal,Extremity Movements  Upper (arms, wrists, hands, fingers): None, normal  Lower (legs,  knees, ankles, toes): None, normal, Trunk Movements  Neck, shoulders, hips: None, normal, Overall Severity  Severity of abnormal movements (highest score from questions above): None, normal  Incapacitation due to abnormal movements: None, normal  Patient's awareness of abnormal movements (rate only patient's report): No Awareness, Dental Status  Current problems with teeth and/or dentures?: No  Does patient usually wear dentures?: No  CIWA: CIWA-Ar Total: 0  COWS: COWS Total Score: 1  Mental Status Exam:  See Mental Status Examination and Suicide Risk Assessment completed by Attending Physician prior to discharge.  Discharge destination: Home  Is patient on multiple antipsychotic therapies at discharge: No  Has Patient had three or more failed trials of antipsychotic monotherapy by history: No  Recommended Plan for Multiple Antipsychotic Therapies:  na   Discharge Orders    Future Orders Please Complete By Expires   Diet - low sodium heart healthy      Increase activity slowly      Discharge instructions      Comments:   Take all of your medications as prescribed.  Be sure to keep ALL follow up appointments as scheduled. This is to ensure getting your refills on time to avoid any interruption in your medication.  If you find that you can not keep your appointment, call the clinic and reschedule. Be sure to tell the nurse if you will need a refill before your appointment.   Discharge wound care:      Comments:  Please apply silvadene cream as instructed. Please see PCM for continued wound management if needed.       Medication List     As of 04/16/2012  3:36 PM    STOP taking these medications         clonazePAM 0.5 MG tablet   Commonly known as: KLONOPIN      ziprasidone 60 MG capsule   Commonly known as: GEODON      TAKE these medications      Indication    albuterol 108 (90 BASE) MCG/ACT inhaler   Commonly known as: PROVENTIL HFA;VENTOLIN HFA   Inhale 2 puffs into  the lungs every 6 (six) hours as needed. For breathing    Indication: Asthma      gabapentin 300 MG capsule   Commonly known as: NEURONTIN   Take 1 capsule (300 mg total) by mouth 2 (two) times daily.    Indication: mood stabilization      ibuprofen 400 MG tablet   Commonly known as: ADVIL,MOTRIN   Take 1 tablet (400 mg total) by mouth every 6 (six) hours as needed. For pain    Indication: pain as needed      PARoxetine 40 MG tablet   Commonly known as: PAXIL   Take 1 tablet (40 mg total) by mouth daily.    Indication: Generalized Anxiety Disorder      QUEtiapine 400 MG tablet   Commonly known as: SEROQUEL   Take 1 tablet (400 mg total) by mouth at bedtime.    Indication: Trouble Sleeping, antipsychotic      silver sulfADIAZINE 1 % cream   Commonly known as: SILVADENE   Apply topically 2 (two) times daily. burn    burn         Follow-up Information    Follow up with [facility]. (Walk in between 8am-9am Monday thru Friday)    Contact information:   [address], [place] [address] [phone] FAX      Follow [facility] with [facility]. (You will receive a letter in the mail in one week with date and time of orientation at above address. If you do not recieve letter in one week, call number provided.)    Contact information:   [address] [place], [place] [address] [phone]      Follow up with [facility]. On 04/26/2012. (2:00 with Brayton Caves  Call them if you need to reschedule.  These guys will provide multiple services to you if that is what you want)    Contact information:   [address] [place], [place] [address] [phone] FAX          Follow-up recommendations:  Take all of your medications as prescribed.  Be sure to keep ALL follow up appointments as scheduled. This is to ensure getting your refills on time to avoid any interruption in your medication.  If you find that you can  not keep your appointment, call the clinic and reschedule. Be sure to tell the nurse if you will need a refill before your appointment.   Comments: na  Signed: Norval Gable FNP-BC 04/16/2012, 3:36 PM

## 2012-04-16 NOTE — Progress Notes (Addendum)
Discharge Note:   Patient discharged with bus ticket to his sister's home in [place].   Patient denied SI and HI.   Denied A/V hallucinations.  Denied pain.  Still wearing wrist brace on right arm.  Patient received all his belongings, medications, clothing, miscellaneous items, sneakers, keys.  Patient stated he appreciated all the staff has done to assist him while at [facility]  Patient received all his discharge information including suicide prevention information which he stated he understood and had no questions.

## 2012-04-16 NOTE — Progress Notes (Signed)
  Aftercare Planning Group: 04/16/2012 8:45 AM  Pt attended discharge planning group and actively participated in group.  [name] [name] reports he will be discharging today and he will need 2 bus passes. He will be following up With [facility] and does have reliable transportation to get to appointment as his sister will be assisting him. He reports he is excited to return home and begin getting better as wants to start having contact with his son again and rebuilding that relationship.  No barriers to dc today.  Patient given bus passes and a discount prescription card per his request.  Ashley Jacobs, LCSW 04/16/2012 11:54 AM

## 2012-04-16 NOTE — Progress Notes (Signed)
[facility] Adult Inpatient Family/Significant Other Suicide Prevention Education  Suicide Prevention Education:  Contact Attempts: Harvel Meskill, sister at [phone]  has been identified by the patient as the family member/significant other with whom the patient will be residing, and identified as the person(s) who will aid the patient in the event of a mental health crisis.  With written consent from the patient, two attempts were made to provide suicide prevention education, prior to and/or following the patient's discharge.  We were unsuccessful in providing suicide prevention education.  A suicide education pamphlet was given to the patient to share with family/significant other.  Date and time of first attempt:04/10/12 @ 11:00 Date and time of second attempt:04/12/12 @ 2:30 PM  [place]leen[place] B 04/16/2012, 10:08 AM

## 2012-04-16 NOTE — Discharge Summary (Deleted)
Physician Discharge Summary Note  Patient:  [name] [name] is an 31 y.o., male MRN:  [id number] DOB:  09-14-1980 Patient phone:  [phone] (home)  Patient address:   [address] [place] [address],   Date of Admission:  04/08/2012 Date of Discharge: 04/16/2012   Reason for Admission:[name] [name] is an 31 y.o. male who presented voluntarily to [facility] via EMS with suicidal ideation without plan. Pt endorses depressed mood, insomnia, anhedonia, worthlessness, irritability, isolating, and insomnia. Pt has tried to commit suicide twice in past. Pt reports not having slept in 4 days. Pt has had [facility] admission x3 (most recent July 2013), [name] and most recently at [facility] Oct 2013 for SI and depression. Pt receives med management from [place].  Pt drinks alcohol less than once a month.  Discharge Diagnoses:  AXIS I: Depressive Disorder NOS  AXIS II: No diagnosis  AXIS III:  Past Medical History   Diagnosis  Date   .  Mental disorder    .  Hypertension    .  Depression    .  Bipolar 1 disorder    .  Irregular heart rate    .  Asthma     AXIS IV: economic problems and occupational problems  AXIS V: 61-70 mild symptoms  Level of Care:  OP  Hospital Course:  Medications were manipulated for mood stabilization. Patient will discharge on Seroquel, neurontin, and paxil for mood.  Consults:  None  Significant Diagnostic Studies:  None  Discharge Vitals:   Blood pressure 111/68, pulse 101, temperature 97.4 F (36.3 C), temperature source Oral, resp. rate 18, height 6\' 1"  (1.854 m), weight 127.007 kg (280 lb). Lab Results:   No results found for this or any previous visit (from the past 72 hour(s)).  Physical Findings: AIMS: Facial and Oral Movements Muscles of Facial Expression: None, normal Lips and Perioral Area: None, normal Jaw: None, normal Tongue: None, normal,Extremity Movements Upper (arms, wrists, hands, fingers): None,  normal Lower (legs, knees, ankles, toes): None, normal, Trunk Movements Neck, shoulders, hips: None, normal, Overall Severity Severity of abnormal movements (highest score from questions above): None, normal Incapacitation due to abnormal movements: None, normal Patient's awareness of abnormal movements (rate only patient's report): No Awareness, Dental Status Current problems with teeth and/or dentures?: No Does patient usually wear dentures?: No  CIWA:  CIWA-Ar Total: 0  COWS:  COWS Total Score: 1   Mental Status Exam: See Mental Status Examination and Suicide Risk Assessment completed by Attending Physician prior to discharge.  Discharge destination:  Home  Is patient on multiple antipsychotic therapies at discharge:  No   Has Patient had three or more failed trials of antipsychotic monotherapy by history:  No  Recommended Plan for Multiple Antipsychotic Therapies: na    Medication List     As of 04/16/2012 10:45 AM    ASK your doctor about these medications      Indication    albuterol 108 (90 BASE) MCG/ACT inhaler   Commonly known as: PROVENTIL HFA;VENTOLIN HFA   Inhale 2 puffs into the lungs every 6 (six) hours as needed. For breathing       clonazePAM 0.5 MG tablet   Commonly known as: KLONOPIN   Take 0.5 mg by mouth 3 (three) times daily.       gabapentin 300 MG capsule   Commonly known as: NEURONTIN   Take 300 mg by mouth 3 (three) times daily. For anxiety  ibuprofen 400 MG tablet   Commonly known as: ADVIL,MOTRIN   Take 400 mg by mouth every 6 (six) hours as needed. For pain       PARoxetine 40 MG tablet   Commonly known as: PAXIL   Take 40 mg by mouth daily.       QUEtiapine 400 MG tablet   Commonly known as: SEROQUEL   Take 400 mg by mouth at bedtime.       ziprasidone 60 MG capsule   Commonly known as: GEODON   Take 60 mg by mouth at bedtime.            Follow-up Information    Follow up with [facility]. (Walk in between 8am-9am Monday thru  Friday)    Contact information:   [address], [place] [address] [phone] FAX      Follow up with [facility]. (You will receive a letter in the mail in one week with date and time of orientation at above address. If you do not recieve letter in one week, call number provided.)    Contact information:   [address], [place] [address] [phone]      Follow up with [facility] [place]. On 04/26/2012. (2:00 with Brayton Caves  Call them if you need to reschedule.  These guys will provide multiple services to you if that is what you want)    Contact information:   [address] [place], [place] [address] [phone] FAX          Follow-up recommendations:  Take all of your medications as prescribed.  Be sure to keep ALL follow up appointments as scheduled. This is to ensure getting your refills on time to avoid any interruption in your medication.  If you find that you can not keep your appointment, call the clinic and reschedule. Be sure to tell the nurse if you will need a refill before your appointment.   Comments:  none  Signed: Norval Gable FNP-BC 04/16/2012, 10:45 AM  Reviewed.

## 2012-04-18 NOTE — Discharge Summary (Signed)
Reviewed

## 2012-04-19 NOTE — Progress Notes (Signed)
Patient Discharge Instructions:  After Visit Summary (AVS):   Faxed to:  04/19/12 Psychiatric Admission Assessment Note:   Faxed to:  04/19/12 Suicide Risk Assessment - Discharge Assessment:   Faxed to:  04/19/12 Faxed/Sent to the Next Level Care provider:  04/19/12 Faxed to [facility] @ [phone] Faxed to [facility] @ [phone] Faxed to [facility] @ [phone]  Jerelene Redden, 04/19/2012, 5:18 PM

## 2012-04-22 ENCOUNTER — Encounter (HOSPITAL_COMMUNITY): Payer: Self-pay | Admitting: Emergency Medicine

## 2012-04-22 ENCOUNTER — Emergency Department (HOSPITAL_COMMUNITY)
Admission: EM | Admit: 2012-04-22 | Discharge: 2012-04-23 | Disposition: A | Discharge status: Home / Self Care | Payer: Self-pay | Attending: Emergency Medicine | Admitting: Emergency Medicine

## 2012-04-22 DIAGNOSIS — R443 Hallucinations, unspecified: Secondary | ICD-10-CM | POA: Insufficient documentation

## 2012-04-22 DIAGNOSIS — R440 Auditory hallucinations: Secondary | ICD-10-CM

## 2012-04-22 DIAGNOSIS — J45909 Unspecified asthma, uncomplicated: Secondary | ICD-10-CM | POA: Insufficient documentation

## 2012-04-22 DIAGNOSIS — F319 Bipolar disorder, unspecified: Secondary | ICD-10-CM | POA: Insufficient documentation

## 2012-04-22 DIAGNOSIS — Z79899 Other long term (current) drug therapy: Secondary | ICD-10-CM | POA: Insufficient documentation

## 2012-04-22 DIAGNOSIS — Z87891 Personal history of nicotine dependence: Secondary | ICD-10-CM | POA: Insufficient documentation

## 2012-04-22 DIAGNOSIS — I10 Essential (primary) hypertension: Secondary | ICD-10-CM | POA: Insufficient documentation

## 2012-04-22 DIAGNOSIS — Z8679 Personal history of other diseases of the circulatory system: Secondary | ICD-10-CM | POA: Insufficient documentation

## 2012-04-22 LAB — RAPID URINE DRUG SCREEN, HOSP PERFORMED
Barbiturates: NOT DETECTED
Benzodiazepines: NOT DETECTED
Cocaine: NOT DETECTED
Opiates: NOT DETECTED
Tetrahydrocannabinol: NOT DETECTED

## 2012-04-22 LAB — CBC
Hemoglobin: 15.2 g/dL (ref 13.0–17.0)
MCH: 30.3 pg (ref 26.0–34.0)
MCHC: 34.6 g/dL (ref 30.0–36.0)
Platelets: 279 10*3/uL (ref 150–400)
RDW: 13 % (ref 11.5–15.5)

## 2012-04-22 LAB — COMPREHENSIVE METABOLIC PANEL
ALT: 27 U/L (ref 0–53)
AST: 24 U/L (ref 0–37)
Albumin: 4.3 g/dL (ref 3.5–5.2)
Alkaline Phosphatase: 98 U/L (ref 39–117)
Calcium: 9.4 mg/dL (ref 8.4–10.5)
Potassium: 4 mEq/L (ref 3.5–5.1)
Sodium: 142 mEq/L (ref 135–145)
Total Protein: 7.6 g/dL (ref 6.0–8.3)

## 2012-04-22 LAB — ACETAMINOPHEN LEVEL: Acetaminophen (Tylenol), Serum: <15 ug/mL (ref 10–30)

## 2012-04-22 MED ORDER — TRAZODONE HCL 100 MG PO TABS: 100.0000 mg | ORAL_TABLET | Freq: Every day | ORAL | Status: DC

## 2012-04-22 MED ORDER — ALUM & MAG HYDROXIDE-SIMETH 200-200-20 MG/5ML PO SUSP: 30.0000 mL | ORAL | Status: DC | PRN

## 2012-04-22 MED ORDER — ONDANSETRON HCL 4 MG PO TABS
4.0000 mg | ORAL_TABLET | Freq: Three times a day (TID) | ORAL | Status: DC | PRN
  Administered 2012-04-22: 4 mg via ORAL
  Filled 2012-04-22: qty 1

## 2012-04-22 MED ORDER — PAROXETINE HCL 40 MG PO TABS: 40.0000 mg | ORAL_TABLET | ORAL | Status: DC

## 2012-04-22 MED ORDER — QUETIAPINE FUMARATE 400 MG PO TABS: 400.0000 mg | ORAL_TABLET | Freq: Every day | ORAL | Status: DC

## 2012-04-22 MED ORDER — GABAPENTIN 300 MG PO CAPS: 300.0000 mg | ORAL_CAPSULE | Freq: Two times a day (BID) | ORAL | Status: DC

## 2012-04-22 MED ORDER — CLONAZEPAM 0.5 MG PO TABS: 0.5000 mg | ORAL_TABLET | Freq: Two times a day (BID) | ORAL | Status: DC | PRN

## 2012-04-22 MED ORDER — NICOTINE 21 MG/24HR TD PT24
21.0000 mg | MEDICATED_PATCH | Freq: Every day | TRANSDERMAL | Status: DC
  Administered 2012-04-22: 21 mg via TRANSDERMAL
  Filled 2012-04-22: qty 1

## 2012-04-22 MED ORDER — IBUPROFEN 600 MG PO TABS: 600.0000 mg | ORAL_TABLET | Freq: Three times a day (TID) | ORAL | Status: DC | PRN

## 2012-04-22 MED ORDER — ONDANSETRON 8 MG PO TBDP
8.0000 mg | ORAL_TABLET | Freq: Once | ORAL | Status: AC
  Administered 2012-04-22: 8 mg via ORAL
  Filled 2012-04-22: qty 1

## 2012-04-22 MED ORDER — ACETAMINOPHEN 325 MG PO TABS: 650.0000 mg | ORAL_TABLET | ORAL | Status: DC | PRN

## 2012-04-22 NOTE — ED Provider Notes (Signed)
History     CSN: [id number]  Arrival date & time 04/22/12  1350   First MD Initiated Contact with Patient 04/22/12 1507      Chief Complaint  Patient presents with  . Medical Clearance    (Consider location/radiation/quality/duration/timing/severity/associated sxs/prior treatment) HPI Pt to the ER with  Complaints of hearing voices. He says that his medicines normally work but starting yesterday a deep angry voice keeps telling him to harm himself and it is scaring him. He denies having done anything to himself to cause pain or harm himself today. He denies having any other problems at this time. nad vss   Past Medical History  Diagnosis Date  . Mental disorder   . Hypertension   . Depression   . Bipolar 1 disorder   . Irregular heart rate   . Asthma     History reviewed. No pertinent past surgical history.  Family History  Problem Relation Age of Onset  . Hypertension Mother   . Hypertension Other     History  Substance Use Topics  . Smoking status: Former Smoker -- 0.5 packs/day  . Smokeless tobacco: Not on file  . Alcohol Use: No     Comment: Pt reports he drank a couple of 40s, a couple of shots of liqour, and brown and white on Sat      Review of Systems  Review of Systems  Gen: no weight loss, fevers, chills, night sweats  Neck: no neck pain  Lungs:No wheezing, coughing or hemoptysis CV: no chest pain, palpitations, dependent edema or orthopnea  Abd: no abdominal pain, nausea, vomiting  GU: no dysuria or gross hematuria  MSK:  No abnormalities  Neuro: no headache, no focal neurologic deficits  Skin: no abnormalities Psyche: hearing voices   Allergies  Review of patient's allergies indicates no known allergies.  Home Medications   Current Outpatient Rx  Name  Route  Sig  Dispense  Refill  . ALBUTEROL SULFATE HFA 108 (90 BASE) MCG/ACT IN AERS   Inhalation   Inhale 2 puffs into the lungs every 6 (six) hours as needed. For breathing         . CLONAZEPAM 0.5 MG PO TABS   Oral   Take 0.5 mg by mouth 3 (three) times daily.         Marland Kitchen GABAPENTIN 300 MG PO CAPS   Oral   Take 1 capsule (300 mg total) by mouth 2 (two) times daily.   60 capsule   0   . IBUPROFEN 400 MG PO TABS   Oral   Take 1 tablet (400 mg total) by mouth every 6 (six) hours as needed. For pain   30 tablet   0   . PAROXETINE HCL 40 MG PO TABS   Oral   Take 1 tablet (40 mg total) by mouth daily.   30 tablet   0   . QUETIAPINE FUMARATE 400 MG PO TABS   Oral   Take 1 tablet (400 mg total) by mouth at bedtime.   30 tablet   0   . TRAZODONE HCL 100 MG PO TABS   Oral   Take 100 mg by mouth at bedtime.           BP 116/73  Pulse 80  Temp 98.6 F (37 C) (Oral)  Resp 12  SpO2 98%  Physical Exam  Nursing note and vitals reviewed. Constitutional: He appears well-developed and well-nourished. No distress.  HENT:  Head: Normocephalic and atraumatic.  Eyes: Pupils are equal, round, and reactive to light.  Neck: Normal range of motion. Neck supple.  Cardiovascular: Normal rate and regular rhythm.   Pulmonary/Chest: Effort normal.  Abdominal: Soft.  Neurological: He is alert.  Skin: Skin is warm and dry.  Psychiatric: His mood appears anxious. He is actively hallucinating. He expresses no homicidal and no suicidal ideation. He expresses no suicidal plans and no homicidal plans.       Voices telling him to harm himself    ED Course  Procedures (including critical care time)   Labs Reviewed  CBC  URINE RAPID DRUG SCREEN (HOSP PERFORMED)  ACETAMINOPHEN LEVEL  COMPREHENSIVE METABOLIC PANEL  ETHANOL  SALICYLATE LEVEL   No results found.   1. Hearing voices       MDM  ACT consulted. Holding orders placed. Pt physically stable        Dorthula Matas, [place] 04/22/12 1527

## 2012-04-22 NOTE — BH Assessment (Signed)
Assessment Note   [name] [name] is a 31 y.o. male presenting to the emerg dept with aud halluc with command to harm self, no specific plan.  Pt recently d/c'd from [facility] on 04/19/12.  Pt states that he has been hiding in a room for the past 2 days and not going out to "get away from the voices".  Pt says he came back to the hospital before he acted on the commands.  Pt says has been hearing voices x2days, denies any visual halluc, no HI.  Pt says while in the hospital, geodon meds were d/c'd and he was placed on paxil(40mg , qd).  Pt has no therapist but is managing meds with local mental health facility([facility]). Pt describes voices as deep and angry and it's scaring him.  Pt is frequent visitor to this emerg dept and has past inpt admissions with [facility](2x's), [facility], and [address], dates unk. Pt denies issues with SA but uses alcohol occasionally. Pt is pending telepsych(final disposition).    Axis I: Bipolar D/O w/psych features  Axis II: Deferred Axis III:  Past Medical History  Diagnosis Date  . Mental disorder   . Hypertension   . Depression   . Bipolar 1 disorder   . Irregular heart rate   . Asthma    Axis IV: other psychosocial or environmental problems and problems related to social environment Axis V: 41-50 serious symptoms  Past Medical History:  Past Medical History  Diagnosis Date  . Mental disorder   . Hypertension   . Depression   . Bipolar 1 disorder   . Irregular heart rate   . Asthma     History reviewed. No pertinent past surgical history.  Family History:  Family History  Problem Relation Age of Onset  . Hypertension Mother   . Hypertension Other     Social History:  reports that he has quit smoking. He does not have any smokeless tobacco history on file. He reports that he drinks alcohol. He reports that he does not use illicit drugs.  Additional Social History:  Alcohol / Drug Use Pain Medications: See attached med list    Prescriptions: See attached med list  Over the Counter: See attached med list  History of alcohol / drug use?: Yes Longest period of sobriety (when/how long): None   CIWA: CIWA-Ar BP: 143/79 mmHg Pulse Rate: 84  COWS:    Allergies: No Known Allergies  Home Medications:  (Not in a hospital admission)  OB/GYN Status:  No LMP for male patient.  General Assessment Data Location of Assessment: [facility] Living Arrangements: Other relatives (Lives with sister ) Can pt return to current living arrangement?: Yes Admission Status: Voluntary Is patient capable of signing voluntary admission?: Yes Transfer from: Acute Hospital Referral Source: MD  Education Status Is patient currently in school?: No Current Grade: None  Highest grade of school patient has completed: 5th  Name of school: None  Contact person: None   Risk to self Suicidal Ideation: No Suicidal Intent: No Is patient at risk for suicide?: No Suicidal Plan?: No Specify Current Suicidal Plan: None  Access to Means: No Specify Access to Suicidal Means: None  What has been your use of drugs/alcohol within the last 12 months?: Pt uses alcohol, no SA  Previous Attempts/Gestures: Yes How many times?: 2  Other Self Harm Risks: None  Triggers for Past Attempts: Unpredictable Intentional Self Injurious Behavior: None Comment - Self Injurious Behavior: None  Family Suicide History: No Recent  stressful life event(s): Other (Comment) (Chronic MH ) Persecutory voices/beliefs?: Yes Depression: Yes Depression Symptoms: Feeling worthless/self pity;Loss of interest in usual pleasures;Isolating Substance abuse history and/or treatment for substance abuse?: No Suicide prevention information given to non-admitted patients: Not applicable  Risk to Others Homicidal Ideation: No Thoughts of Harm to Others: No Current Homicidal Intent: No Current Homicidal Plan: No Access to Homicidal Means: No Identified Victim: None  History  of harm to others?: No Assessment of Violence: None Noted Violent Behavior Description: None  Does patient have access to weapons?: No Criminal Charges Pending?: No Does patient have a court date: No  Psychosis Hallucinations: Auditory;With command Delusions: None noted  Mental Status Report Appear/Hygiene: Disheveled;Poor hygiene Eye Contact: Good Motor Activity: Unremarkable Speech: Logical/coherent Level of Consciousness: Quiet/awake Mood: Depressed;Anhedonia;Sad Affect: Depressed;Sad Anxiety Level: Minimal Thought Processes: Coherent;Relevant Judgement: Impaired Orientation: Person;Place;Time;Situation Obsessive Compulsive Thoughts/Behaviors: None  Cognitive Functioning Concentration: Normal Memory: Recent Intact;Remote Intact IQ: Average Insight: Fair Impulse Control: Fair Appetite: Good Weight Loss: 0  Weight Gain: 0  Sleep: Decreased Total Hours of Sleep: 5  Vegetative Symptoms: None  ADLScreening [facility] Assessment Services) Patient's cognitive ability adequate to safely complete daily activities?: Yes Patient able to express need for assistance with ADLs?: Yes Independently performs ADLs?: Yes (appropriate for developmental age)  Abuse/Neglect [facility]) Physical Abuse: Denies Verbal Abuse: Denies Sexual Abuse: Denies  Prior Inpatient Therapy Prior Inpatient Therapy: Yes Prior Therapy Dates: 2012, 2013 Prior Therapy Facilty/Provider(s): [facility], [facility], [facility] Reason for Treatment: Depression/SI  Prior Outpatient Therapy Prior Outpatient Therapy: Yes Prior Therapy Dates: Current  Prior Therapy Facilty/Provider(s): [facility] Reason for Treatment: Med Mgt   ADL Screening (condition at time of admission) Patient's cognitive ability adequate to safely complete daily activities?: Yes Patient able to express need for assistance with ADLs?: Yes Independently performs ADLs?: Yes (appropriate for developmental age) Weakness of Legs: None Weakness of Arms/Hands:  None  Home Assistive Devices/Equipment Home Assistive Devices/Equipment: None  Therapy Consults (therapy consults require a physician order) PT Evaluation Needed: No OT Evalulation Needed: No SLP Evaluation Needed: No Abuse/Neglect Assessment (Assessment to be complete while patient is alone) Physical Abuse: Denies Verbal Abuse: Denies Sexual Abuse: Denies Exploitation of patient/patient's resources: Denies Self-Neglect: Denies Values / Beliefs Cultural Requests During Hospitalization: None Spiritual Requests During Hospitalization: None Consults Spiritual Care Consult Needed: No Social Work Consult Needed: No Merchant navy officer (For Healthcare) Advance Directive: Patient does not have advance directive;Patient would not like information Pre-existing out of facility DNR order (yellow form or pink MOST form): No Nutrition Screen- [facility] Patient's home diet: Regular Have you recently lost weight without trying?: No Have you been eating poorly because of a decreased appetite?: No Malnutrition Screening Tool Score: 0   Additional Information 1:1 In Past 12 Months?: No CIRT Risk: No Elopement Risk: No Does patient have medical clearance?: Yes     Disposition:  Disposition Disposition of Patient: Referred to (Telepsych ) Type of inpatient treatment program: Adult Type of outpatient treatment: Adult (Telepsych ) Patient referred to: Other (Comment) (Telepsych )  On Site Evaluation by:   Reviewed with Physician:     Murrell Redden 04/22/2012 10:53 PM

## 2012-04-22 NOTE — ED Notes (Signed)
Pt put in blue scrubs and wanded by security - urine sample collected

## 2012-04-22 NOTE — ED Notes (Signed)
Pt presenting to ed with c/o hearing voices that are telling him to hurt himself. Pt denies HI. Pt states he is also having positive nausea and dizziness. Pt states he hasn't been able to eat. Pt states "I called ems because I didn't think it was safe for me to stay by myself"

## 2012-04-22 NOTE — ED Provider Notes (Addendum)
Medical screening examination/treatment/procedure(s) were performed by non-physician practitioner and as supervising physician I was immediately available for consultation/collaboration.    Nelia Shi, MD 04/22/12 1541  Patient evaluated by a telemetry psychiatry who stated the patient was okay to discharge.  Will be placed on medications as directed.  Nelia Shi, MD 04/22/12 [phone]

## 2012-04-23 NOTE — ED Notes (Signed)
Patient went in bathroom and tried to make himself vomit. Patient informed that he should not do that.

## 2012-04-23 NOTE — ED Notes (Signed)
Patient discharge via ambulatory with escort from [facility] security with a steady gait. Respirations equal and unlabored. Skin warm and dry. No acute distress noted.

## 2012-07-21 ENCOUNTER — Other Ambulatory Visit: Payer: Self-pay

## 2012-07-21 ENCOUNTER — Encounter (HOSPITAL_COMMUNITY): Payer: Self-pay | Admitting: Emergency Medicine

## 2012-07-21 ENCOUNTER — Emergency Department (HOSPITAL_COMMUNITY)
Admission: EM | Admit: 2012-07-21 | Discharge: 2012-07-21 | Disposition: A | Discharge status: Home / Self Care | Payer: Self-pay | Attending: Emergency Medicine | Admitting: Emergency Medicine

## 2012-07-21 DIAGNOSIS — F32A Depression, unspecified: Secondary | ICD-10-CM

## 2012-07-21 DIAGNOSIS — Z8679 Personal history of other diseases of the circulatory system: Secondary | ICD-10-CM | POA: Insufficient documentation

## 2012-07-21 DIAGNOSIS — J45909 Unspecified asthma, uncomplicated: Secondary | ICD-10-CM | POA: Insufficient documentation

## 2012-07-21 DIAGNOSIS — F489 Nonpsychotic mental disorder, unspecified: Secondary | ICD-10-CM | POA: Insufficient documentation

## 2012-07-21 DIAGNOSIS — R5381 Other malaise: Secondary | ICD-10-CM | POA: Insufficient documentation

## 2012-07-21 DIAGNOSIS — R112 Nausea with vomiting, unspecified: Secondary | ICD-10-CM | POA: Insufficient documentation

## 2012-07-21 DIAGNOSIS — F329 Major depressive disorder, single episode, unspecified: Secondary | ICD-10-CM

## 2012-07-21 DIAGNOSIS — Z79899 Other long term (current) drug therapy: Secondary | ICD-10-CM | POA: Insufficient documentation

## 2012-07-21 DIAGNOSIS — F411 Generalized anxiety disorder: Secondary | ICD-10-CM | POA: Insufficient documentation

## 2012-07-21 DIAGNOSIS — Z87891 Personal history of nicotine dependence: Secondary | ICD-10-CM | POA: Insufficient documentation

## 2012-07-21 DIAGNOSIS — F319 Bipolar disorder, unspecified: Secondary | ICD-10-CM | POA: Insufficient documentation

## 2012-07-21 DIAGNOSIS — R5383 Other fatigue: Secondary | ICD-10-CM | POA: Insufficient documentation

## 2012-07-21 DIAGNOSIS — I10 Essential (primary) hypertension: Secondary | ICD-10-CM | POA: Insufficient documentation

## 2012-07-21 DIAGNOSIS — R6883 Chills (without fever): Secondary | ICD-10-CM | POA: Insufficient documentation

## 2012-07-21 LAB — CBC
HCT: 43.7 % (ref 39.0–52.0)
Hemoglobin: 14.5 g/dL (ref 13.0–17.0)
MCH: 29.6 pg (ref 26.0–34.0)
MCHC: 33.2 g/dL (ref 30.0–36.0)
MCV: 89.2 fL (ref 78.0–100.0)
Platelets: 259 10*3/uL (ref 150–400)
RBC: 4.9 MIL/uL (ref 4.22–5.81)
RDW: 12.4 % (ref 11.5–15.5)
WBC: 8 10*3/uL (ref 4.0–10.5)

## 2012-07-21 LAB — BASIC METABOLIC PANEL
BUN: 12 mg/dL (ref 6–23)
CO2: 28 mEq/L (ref 19–32)
Calcium: 8.8 mg/dL (ref 8.4–10.5)
Chloride: 102 mEq/L (ref 96–112)
Creatinine, Ser: 0.76 mg/dL (ref 0.50–1.35)
GFR calc Af Amer: >90 mL/min (ref >90)
GFR calc non Af Amer: >90 mL/min (ref >90)
Glucose, Bld: 88 mg/dL (ref 70–99)
Potassium: 3.6 mEq/L (ref 3.5–5.1)
Sodium: 137 mEq/L (ref 135–145)

## 2012-07-21 LAB — URINALYSIS, ROUTINE W REFLEX MICROSCOPIC
Bilirubin Urine: NEGATIVE
Glucose, UA: NEGATIVE mg/dL
Hgb urine dipstick: NEGATIVE
Ketones, ur: NEGATIVE mg/dL
Leukocytes, UA: NEGATIVE
Nitrite: NEGATIVE
Protein, ur: NEGATIVE mg/dL
Specific Gravity, Urine: 1.021 (ref 1.005–1.030)
Urobilinogen, UA: 0.2 mg/dL (ref 0.0–1.0)
pH: 7.5 (ref 5.0–8.0)

## 2012-07-21 MED ORDER — ONDANSETRON HCL 4 MG PO TABS: 4.0000 mg | ORAL_TABLET | Freq: Four times a day (QID) | ORAL | Status: DC

## 2012-07-21 MED ORDER — ONDANSETRON HCL 4 MG/2ML IJ SOLN
4.0000 mg | Freq: Once | INTRAMUSCULAR | Status: AC
  Administered 2012-07-21: 4 mg via INTRAVENOUS
  Filled 2012-07-21: qty 2

## 2012-07-21 MED ORDER — SODIUM CHLORIDE 0.9 % IV BOLUS (SEPSIS): 1000.0000 mL | Freq: Once | INTRAVENOUS | Status: DC

## 2012-07-21 NOTE — ED Notes (Signed)
Reports called EMS due to chest pain however, once in the back of the ambulance denies chest pain stated he's  out of geodon  or 2 days feels like he needs to talk to someone due to stress and anxiety.

## 2012-07-24 NOTE — ED Provider Notes (Signed)
History    31yM with multiple complaints. Says under increased stress at home but vague about what specifically. Has been feeling more depressed and anxious over period of about a week. Denies SI or houghts of harming anyone else. No hallucinations. Also multiple somatic complaints. Generalized fatigue, n/v, chills, HA. No sob. No urinary complaints.   CSN: [id number]  Arrival date & time 07/21/12  1430   First MD Initiated Contact with Patient 07/21/12 1505      Chief Complaint  Patient presents with  . Depression    (Consider location/radiation/quality/duration/timing/severity/associated sxs/prior treatment) HPI  Past Medical History  Diagnosis Date  . Mental disorder   . Hypertension   . Depression   . Bipolar 1 disorder   . Irregular heart rate   . Asthma     No past surgical history on file.  Family History  Problem Relation Age of Onset  . Hypertension Mother   . Hypertension Other     History  Substance Use Topics  . Smoking status: Former Smoker -- 0.50 packs/day  . Smokeless tobacco: Not on file  . Alcohol Use: 0.0 oz/week     Comment: Pt reports he drank a couple of 40s, a couple of shots of liqour, and brown and white on Sat      Review of Systems  All systems reviewed and negative, other than as noted in HPI.   Allergies  Review of patient's allergies indicates no known allergies.  Home Medications   Current Outpatient Rx  Name  Route  Sig  Dispense  Refill  . albuterol (PROVENTIL HFA;VENTOLIN HFA) 108 (90 BASE) MCG/ACT inhaler   Inhalation   Inhale 2 puffs into the lungs every 6 (six) hours as needed. For breathing         . clonazePAM (KLONOPIN) 0.5 MG tablet   Oral   Take 1 tablet (0.5 mg total) by mouth 2 (two) times daily as needed for anxiety.   30 tablet   0   . gabapentin (NEURONTIN) 300 MG capsule   Oral   Take 1 capsule (300 mg total) by mouth 2 (two) times daily with a meal.   60 capsule   0   . ibuprofen  (ADVIL,MOTRIN) 400 MG tablet   Oral   Take 1 tablet (400 mg total) by mouth every 6 (six) hours as needed. For pain   30 tablet   0   . PARoxetine (PAXIL) 40 MG tablet   Oral   Take 1 tablet (40 mg total) by mouth every morning.   30 tablet   0   . QUEtiapine (SEROQUEL) 400 MG tablet   Oral   Take 1 tablet (400 mg total) by mouth at bedtime.   30 tablet   0   . traZODone (DESYREL) 100 MG tablet   Oral   Take 1 tablet (100 mg total) by mouth at bedtime.   30 tablet   0   . ziprasidone (GEODON) 40 MG capsule   Oral   Take 40 mg by mouth 2 (two) times daily with a meal.         . ondansetron (ZOFRAN) 4 MG tablet   Oral   Take 1 tablet (4 mg total) by mouth every 6 (six) hours.   12 tablet   0     BP 112/70  Pulse 72  Temp(Src) 98.3 F (36.8 C) (Oral)  Resp 16  SpO2 99%  Physical Exam  Nursing note and vitals reviewed. Constitutional: He is  oriented to person, place, and time. He appears well-developed and well-nourished. No distress.  Laying in bed. NAD.   HENT:  Head: Normocephalic and atraumatic.  Eyes: Conjunctivae are normal. Right eye exhibits no discharge. Left eye exhibits no discharge.  Neck: Neck supple.  Cardiovascular: Normal rate, regular rhythm and normal heart sounds.  Exam reveals no gallop and no friction rub.   No murmur heard. Pulmonary/Chest: Effort normal and breath sounds normal. No respiratory distress.  Abdominal: Soft. He exhibits no distension and no mass. There is no tenderness.  Musculoskeletal: He exhibits no edema and no tenderness.  Lower extremities symmetric as compared to each other. No calf tenderness. Negative Homan's. No palpable cords.   Neurological: He is alert and oriented to person, place, and time. He exhibits normal muscle tone.  Skin: Skin is warm and dry. He is not diaphoretic.  Psychiatric: His behavior is normal. Thought content normal.    ED Course  Procedures (including critical care time)  Labs Reviewed   URINALYSIS, ROUTINE W REFLEX MICROSCOPIC - Abnormal; Notable for the following:    APPearance CLOUDY (*)    All other components within normal limits  CBC  BASIC METABOLIC PANEL   No results found.  EKG:  Rhythm: normal sinus Vent. rate 70 BPM PR interval 168 ms QRS duration 108 ms QT/QTc 404/436 ms ST segments: NS ST changes   1. Nausea and vomiting   2. Depression       MDM  31ym with multiple complaints. Seems to be here primarily for depression/anxiety. No SI or HI. Not acutely psychotic. Offered to have ACT speak with him but he is declining. N/v may be viral illness. Benign abdominal exam. Very low suspicion for acute abdomen. Plan symptomatic tx. Emergent return precautions discussed.         Raeford Razor, MD 07/24/12 [phone]

## 2012-09-22 ENCOUNTER — Emergency Department (HOSPITAL_COMMUNITY)
Admission: EM | Admit: 2012-09-22 | Discharge: 2012-09-22 | Disposition: A | Discharge status: Home / Self Care | Payer: Self-pay | Attending: Emergency Medicine | Admitting: Emergency Medicine

## 2012-09-22 ENCOUNTER — Encounter (HOSPITAL_COMMUNITY): Payer: Self-pay | Admitting: Emergency Medicine

## 2012-09-22 DIAGNOSIS — Z87891 Personal history of nicotine dependence: Secondary | ICD-10-CM | POA: Insufficient documentation

## 2012-09-22 DIAGNOSIS — W268XXA Contact with other sharp object(s), not elsewhere classified, initial encounter: Secondary | ICD-10-CM | POA: Insufficient documentation

## 2012-09-22 DIAGNOSIS — H61319 Acquired stenosis of external ear canal secondary to trauma, unspecified ear: Secondary | ICD-10-CM | POA: Insufficient documentation

## 2012-09-22 DIAGNOSIS — J45909 Unspecified asthma, uncomplicated: Secondary | ICD-10-CM | POA: Insufficient documentation

## 2012-09-22 DIAGNOSIS — Y9389 Activity, other specified: Secondary | ICD-10-CM | POA: Insufficient documentation

## 2012-09-22 DIAGNOSIS — S0920XA Traumatic rupture of unspecified ear drum, initial encounter: Secondary | ICD-10-CM | POA: Insufficient documentation

## 2012-09-22 DIAGNOSIS — H6120 Impacted cerumen, unspecified ear: Secondary | ICD-10-CM | POA: Insufficient documentation

## 2012-09-22 DIAGNOSIS — H9319 Tinnitus, unspecified ear: Secondary | ICD-10-CM | POA: Insufficient documentation

## 2012-09-22 DIAGNOSIS — Y929 Unspecified place or not applicable: Secondary | ICD-10-CM | POA: Insufficient documentation

## 2012-09-22 DIAGNOSIS — H7291 Unspecified perforation of tympanic membrane, right ear: Secondary | ICD-10-CM

## 2012-09-22 DIAGNOSIS — I10 Essential (primary) hypertension: Secondary | ICD-10-CM | POA: Insufficient documentation

## 2012-09-22 DIAGNOSIS — F319 Bipolar disorder, unspecified: Secondary | ICD-10-CM | POA: Insufficient documentation

## 2012-09-22 DIAGNOSIS — H61311 Acquired stenosis of right external ear canal secondary to trauma: Secondary | ICD-10-CM

## 2012-09-22 DIAGNOSIS — R42 Dizziness and giddiness: Secondary | ICD-10-CM | POA: Insufficient documentation

## 2012-09-22 DIAGNOSIS — Z79899 Other long term (current) drug therapy: Secondary | ICD-10-CM | POA: Insufficient documentation

## 2012-09-22 MED ORDER — IBUPROFEN 200 MG PO TABS
600.0000 mg | ORAL_TABLET | Freq: Once | ORAL | Status: AC
  Administered 2012-09-22: 600 mg via ORAL
  Filled 2012-09-22: qty 3

## 2012-09-22 MED ORDER — CIPROFLOXACIN-DEXAMETHASONE 0.3-0.1 % OT SUSP: 4.0000 [drp] | Freq: Two times a day (BID) | OTIC | Status: DC

## 2012-09-22 MED ORDER — HYDROCODONE-ACETAMINOPHEN 5-325 MG PO TABS: 1.0000 | ORAL_TABLET | Freq: Four times a day (QID) | ORAL | Status: DC | PRN

## 2012-09-22 MED ORDER — IBUPROFEN 600 MG PO TABS: 600.0000 mg | ORAL_TABLET | Freq: Four times a day (QID) | ORAL | Status: DC | PRN

## 2012-09-22 NOTE — ED Notes (Signed)
Per EMS - Pt presents w/ right ear pain, on Thursday pt attempted to clean ear canal w/ Q-tip, removed some material and then tried again noted some blood on q-tip. Slept all night then woke Friday w/ ear pain, yesterday pain began to radiate to entire right side of his face. Pt has noted no drainage or bleeding from his ear, has Rx'ed /w Tylenol w/o any relief. BP - 140/84, HR - 88, Resp - 16,

## 2012-09-22 NOTE — ED Notes (Signed)
Pt states is unable to lie on right side d/t pain in face and headache on right side as well.

## 2012-09-22 NOTE — ED Notes (Signed)
Discharge instructions reviewed w/ pt., verbalizes understanding. Three prescriptions provided at discharge.

## 2012-09-22 NOTE — ED Provider Notes (Addendum)
History     CSN: [id number]  Arrival date & time 09/22/12  4098   First MD Initiated Contact with Patient 09/22/12 [phone]      Chief Complaint  Patient presents with  . Otalgia    (Consider location/radiation/quality/duration/timing/severity/associated sxs/prior treatment) HPI Comments: Pt presents w/ right ear pain. On Thursday pt attempted to clean ear canal w/ Q-tip, removed some material and then tried again noted some blood on q-tip. Slept all night then woke Friday w/ ear pain, yesterday pain began to radiate to entire right side of his face. Pt has noted no drainage or bleeding from his ear, has Rx'ed /w Tylenol w/o any relief. Pt does endorse some intermittent vertigo, and ringing in the ear.      Patient is a 32 y.o. male presenting with ear pain. The history is provided by the patient.  Otalgia Associated symptoms: tinnitus   Associated symptoms: no abdominal pain and no cough     Past Medical History  Diagnosis Date  . Mental disorder   . Hypertension   . Depression   . Bipolar 1 disorder   . Irregular heart rate   . Asthma     History reviewed. No pertinent past surgical history.  Family History  Problem Relation Age of Onset  . Hypertension Mother   . Hypertension Other     History  Substance Use Topics  . Smoking status: Former Smoker -- 0.50 packs/day  . Smokeless tobacco: Never Used  . Alcohol Use: 0.0 oz/week     Comment: Pt reports he drank a couple of 40s, a couple of shots of liqour, and brown and white on Sat      Review of Systems  Constitutional: Negative for activity change and appetite change.  HENT: Positive for ear pain and tinnitus.   Respiratory: Negative for cough and shortness of breath.   Cardiovascular: Negative for chest pain.  Gastrointestinal: Negative for abdominal pain.  Genitourinary: Negative for dysuria.    Allergies  Review of patient's allergies indicates no known allergies.  Home Medications   Current  Outpatient Rx  Name  Route  Sig  Dispense  Refill  . albuterol (PROVENTIL HFA;VENTOLIN HFA) 108 (90 BASE) MCG/ACT inhaler   Inhalation   Inhale 2 puffs into the lungs every 6 (six) hours as needed. For breathing         . clonazePAM (KLONOPIN) 0.5 MG tablet   Oral   Take 1 tablet (0.5 mg total) by mouth 2 (two) times daily as needed for anxiety.   30 tablet   0   . gabapentin (NEURONTIN) 300 MG capsule   Oral   Take 1 capsule (300 mg total) by mouth 2 (two) times daily with a meal.   60 capsule   0   . ibuprofen (ADVIL,MOTRIN) 400 MG tablet   Oral   Take 1 tablet (400 mg total) by mouth every 6 (six) hours as needed. For pain   30 tablet   0   . PARoxetine (PAXIL) 40 MG tablet   Oral   Take 1 tablet (40 mg total) by mouth every morning.   30 tablet   0   . QUEtiapine (SEROQUEL) 400 MG tablet   Oral   Take 1 tablet (400 mg total) by mouth at bedtime.   30 tablet   0   . traZODone (DESYREL) 100 MG tablet   Oral   Take 1 tablet (100 mg total) by mouth at bedtime.   30 tablet  0   . ziprasidone (GEODON) 40 MG capsule   Oral   Take 40 mg by mouth 2 (two) times daily with a meal.         . ciprofloxacin-dexamethasone (CIPRODEX) otic suspension   Right Ear   Place 4 drops into the right ear 2 (two) times daily.   7.5 mL   0   . HYDROcodone-acetaminophen (NORCO/VICODIN) 5-325 MG per tablet   Oral   Take 1 tablet by mouth every 6 (six) hours as needed for pain.   8 tablet   0   . ibuprofen (ADVIL,MOTRIN) 600 MG tablet   Oral   Take 1 tablet (600 mg total) by mouth every 6 (six) hours as needed for pain.   30 tablet   0     BP 132/73  Pulse 85  Temp(Src) 97.8 F (36.6 C) (Oral)  Wt 285 lb (129.275 kg)  BMI 37.61 kg/m2  SpO2 97%  Physical Exam  Nursing note and vitals reviewed. Constitutional: He is oriented to person, place, and time. He appears well-developed.  HENT:  Head: Normocephalic and atraumatic.  Left Ear: External ear normal.  Right  ear canal - filled with debri, including wax. We cleared the canal with curette - and still there is enough inflammation where the TM is not visible at all. There is dry blood appreciated, with aht appears to be granulation tissue.  Eyes: Conjunctivae and EOM are normal. Pupils are equal, round, and reactive to light.  Neck: Normal range of motion. Neck supple.  Cardiovascular: Normal rate and regular rhythm.   Pulmonary/Chest: Effort normal and breath sounds normal.  Abdominal: Soft. Bowel sounds are normal. He exhibits no distension. There is no tenderness. There is no rebound and no guarding.  Neurological: He is alert and oriented to person, place, and time.  Skin: Skin is warm.    ED Course  EAR CERUMEN REMOVAL Date/Time: 09/22/2012 11:08 AM Performed by: Derwood Kaplan Authorized by: Derwood Kaplan Consent: Verbal consent obtained. Risks and benefits: risks, benefits and alternatives were discussed Patient identity confirmed: verbally with patient Local anesthetic: none Location details: right ear Procedure type: curette Patient sedated: no   (including critical care time)  Labs Reviewed - No data to display No results found.   1. Perforated tympanic membrane, right   2. External ear canal stenosis, traumatic, right       MDM  Pt comes in with right sided ear pain. He had injury from q tip. Pt has some vertigo like sx intermittently, and tinnitus - so likely has middle ear inflammation and possible TM rupture. Cant visualize the TM due to all the debri, despite removal of some cerumen.  Will treat as TM rupture. ENT f.u provided.  Derwood Kaplan, MD 09/22/12 1110  Derwood Kaplan, MD 09/22/12 1110

## 2012-09-22 NOTE — Discharge Instructions (Signed)
Your ear canal shows signs of trauma. You are having some ringing in the ears, and dizziness, so we suspect tympanic membrane damage - although we dont clearly visualize it.  Please see the ENT doctor as recommended. Take the meds provided.   Eardrum Perforation The eardrum is a thin, round tissue inside the ear that separates the ear canal from the middle ear. This is the tissue that detects sound and enables you to hear. The eardrum can be punctured or torn (perforated). Eardrums generally heal without help and with little or no permanent hearing loss. CAUSES   Sudden pressure changes that happen in situations like scuba diving or flying in an airplane.  Foreign objects in the ear.  Inserting a cotton-tipped swab in the ear.  Loud noise.  Trauma to the ear. SYMPTOMS   Hearing loss.  Ear pain.  Ringing in the ears.  Discharge or bleeding from the ear.  Dizziness.  Vomiting.  Facial paralysis. HOME CARE INSTRUCTIONS   Keep your ear dry, as this improves healing. Swimming, diving, and showers are not allowed until healing is complete. While bathing, protect the ear by placing a piece of cotton covered with petroleum jelly in the outer ear canal.  Only take over-the-counter or prescription medicines for pain, discomfort, or fever as directed by your caregiver.  Blow your nose gently. Forceful blowing increases the pressure in the middle ear and may cause further injury or delay healing.  Resume normal activities, such as showering, when the perforation has healed. Your caregiver can let you know when this has occurred.  Talk to your caregiver before flying on an airplane. Air travel is generally allowed with a perforated eardrum.  If your caregiver has given you a follow-up appointment, it is very important to keep that appointment. Failure to keep the appointment could result in a chronic or permanent injury, pain, hearing loss, and disability. SEEK IMMEDIATE MEDICAL  CARE IF:   You have bleeding or pus coming from your ear.  You have problems with balance, dizziness, nausea, or vomiting.  You develop increased pain.  You have a fever. MAKE SURE YOU:   Understand these instructions.  Will watch your condition.  Will get help right away if you are not doing well or get worse. Document Released: 05/19/2000 Document Revised: 08/14/2011 Document Reviewed: 05/21/2008 [facility] Patient Information 2013 [place], [place].  Draining Ear Ear wax, pus, blood and other fluids are examples of the different types of drainage from ears. Drops or cream may be needed to lessen the itching which may occur with ear drainage. CAUSES   Skin irritations in the ear.  Ear infection.  Swimmer's ear.  Ruptured eardrum.  Foreign object in the ear canal.  Sudden pressure changes.  Head injury. HOME CARE INSTRUCTIONS   Only take over-the-counter or prescription medicines for pain, fever, or discomfort as directed by your caregiver.  Do not rub the ear canal with cotton-tipped swabs.  Do not swim until your caregiver says it is okay.  Before you take a shower, cover a cotton ball with petroleum jelly to keep water out.  Limit exposure to smoke. Secondhand smoke can increase the chance for ear infections.  Keep up with immunizations.  Wash your hands well.  Keep all follow-up appointments to examine the ear and evaluate hearing. SEEK MEDICAL CARE IF:   You have increased drainage.  You have ear pain, a fever, or drainage that is not getting better after 48 hours of antibiotics.  You are unusually  tired. SEEK IMMEDIATE MEDICAL CARE IF:  You have severe ear pain or headache.  The patient is older than 3 months with a rectal or oral temperature of 102 F (38.9 C) or higher.  The patient is 58 months old or younger with a rectal temperature of 100.4 F (38 C) or higher.  You vomit.  You feel dizzy.  You have a seizure.  You have new hearing  loss. MAKE SURE YOU:   Understand these instructions.  Will watch your condition.  Will get help right away if you are not doing well or get worse. Document Released: 05/22/2005 Document Revised: 08/14/2011 Document Reviewed: 03/25/2009 [facility] Patient Information 2013 [place], [place].

## 2012-10-17 ENCOUNTER — Encounter (HOSPITAL_COMMUNITY): Payer: Self-pay | Admitting: *Deleted

## 2012-10-17 ENCOUNTER — Emergency Department (HOSPITAL_COMMUNITY)
Admission: EM | Admit: 2012-10-17 | Discharge: 2012-10-17 | Disposition: A | Discharge status: Home / Self Care | Payer: Self-pay | Attending: Emergency Medicine | Admitting: Emergency Medicine

## 2012-10-17 DIAGNOSIS — J45909 Unspecified asthma, uncomplicated: Secondary | ICD-10-CM | POA: Insufficient documentation

## 2012-10-17 DIAGNOSIS — I10 Essential (primary) hypertension: Secondary | ICD-10-CM | POA: Insufficient documentation

## 2012-10-17 DIAGNOSIS — R51 Headache: Secondary | ICD-10-CM | POA: Insufficient documentation

## 2012-10-17 DIAGNOSIS — Z79899 Other long term (current) drug therapy: Secondary | ICD-10-CM | POA: Insufficient documentation

## 2012-10-17 DIAGNOSIS — Z8679 Personal history of other diseases of the circulatory system: Secondary | ICD-10-CM | POA: Insufficient documentation

## 2012-10-17 DIAGNOSIS — F329 Major depressive disorder, single episode, unspecified: Secondary | ICD-10-CM

## 2012-10-17 DIAGNOSIS — H9209 Otalgia, unspecified ear: Secondary | ICD-10-CM | POA: Insufficient documentation

## 2012-10-17 DIAGNOSIS — Z8659 Personal history of other mental and behavioral disorders: Secondary | ICD-10-CM | POA: Insufficient documentation

## 2012-10-17 DIAGNOSIS — R509 Fever, unspecified: Secondary | ICD-10-CM | POA: Insufficient documentation

## 2012-10-17 DIAGNOSIS — F319 Bipolar disorder, unspecified: Secondary | ICD-10-CM | POA: Insufficient documentation

## 2012-10-17 DIAGNOSIS — K1120 Sialoadenitis, unspecified: Secondary | ICD-10-CM | POA: Insufficient documentation

## 2012-10-17 DIAGNOSIS — Z87891 Personal history of nicotine dependence: Secondary | ICD-10-CM | POA: Insufficient documentation

## 2012-10-17 MED ORDER — AMOXICILLIN-POT CLAVULANATE 875-125 MG PO TABS: 1.0000 | ORAL_TABLET | Freq: Two times a day (BID) | ORAL | Status: DC

## 2012-10-17 NOTE — ED Notes (Signed)
Pt to ER via EMS for complaints of right sided lower jaw facial swelling extending to right side of neck; pt states that symptoms began on Monday and has been taking Tylenol for pain with some relief; pt states that the pain got worse tonight and was unable to obtain any relief; pt denies difficulty breathing or swallowing; pt states that it is sore to swallow; pt states that pain is from rt side of throat radiating up jaw toward ear; pt denies ear drainage or dental swelling.

## 2012-10-17 NOTE — ED Notes (Signed)
[id number]> Expected date:<BR> Expected time:<BR> Means of arrival:<BR> Comments:<BR> EMS, facial pain and swelling

## 2012-10-17 NOTE — ED Provider Notes (Signed)
Medical screening examination/treatment/procedure(s) were conducted as a shared visit with non-physician practitioner(s) and myself.  I personally evaluated the patient during the encounter.  Gradual onset pain tenderness and mild swelling to the right cheek over the last few days with no dental or mandibular or maxillary or TMJ pain, no trauma no fever no redness to the skin and no purulent drainage and the buccal mucosa of the right cheek is mildly swollen mildly tender to palpation without fluctuance with dentition and nontender no trismus no drooling no stridor no TMJ tenderness no mandibular or neck tenderness,  Doubt deep space neck infection or airway compromise or sepsis.  Hurman Horn, MD 10/28/12 [phone]

## 2012-10-17 NOTE — ED Provider Notes (Signed)
History     CSN: [id number]  Arrival date & time 10/17/12  1478   First MD Initiated Contact with Patient 10/17/12 0631      Chief Complaint  Patient presents with  . Facial Swelling    (Consider location/radiation/quality/duration/timing/severity/associated sxs/prior treatment) HPI Comments: [name] [name] is a 32 y/o M with PMHx of HTN, depression, Bipolar disorder type I, asthma presenting to the ED with facial swelling since Monday. Patient reported that on Sunday he started to experience sudden discomfort to the right cheek, stated that he took some Tylenol to aid in the pain. Patient reported that when he awoke on Monday morning he noticed swelling localized to the right cheek with worsening pain throughout the week. Patient described pain to be a constant sharp, shooting pain that radiates to the right temple region and right side of the neck. Patient reported that pain is worse with chewing and opening mouth - stated that pain feels as if someone is squeezing his lower jaw. Stated that he has been using Tylenol, Ibuprofen, BC powder with unsuccessful relief in pain. Reported that he felt feverish yesterday, but denied recording temperature. Associated symptoms are headache. Denied drainage from the mouth, bleeding from the gums/mouth, dental pain/problems, slurred speech, numbness, tingling, blurred vision, visual distortions, chest pain, shortness of breathe, difficulty breathing, dysphagia, odynphagia, tinnitus, neck stiffness, nuchal rigidity, congestion.   The history is provided by the patient. No language interpreter was used.    Past Medical History  Diagnosis Date  . Mental disorder   . Hypertension   . Depression   . Bipolar 1 disorder   . Irregular heart rate   . Asthma     History reviewed. No pertinent past surgical history.  Family History  Problem Relation Age of Onset  . Hypertension Mother   . Hypertension Other     History  Substance Use Topics  .  Smoking status: Former Smoker -- 0.50 packs/day  . Smokeless tobacco: Never Used  . Alcohol Use: 0.0 oz/week     Comment: occ.      Review of Systems  Constitutional: Negative for chills and fatigue.  HENT: Positive for ear pain (right ear pain). Negative for congestion, sore throat, rhinorrhea, trouble swallowing, neck pain, neck stiffness and tinnitus.   Eyes: Negative for photophobia, pain and visual disturbance.  Respiratory: Negative for cough, choking, chest tightness and shortness of breath.   Cardiovascular: Negative for chest pain.  Gastrointestinal: Negative for nausea, vomiting, abdominal pain, diarrhea and constipation.  Genitourinary: Negative for dysuria, decreased urine volume and difficulty urinating.  Musculoskeletal: Negative for back pain and arthralgias.  Skin: Negative for pallor and rash.  Neurological: Positive for headaches. Negative for dizziness, weakness, light-headedness and numbness.  All other systems reviewed and are negative.    Allergies  Review of patient's allergies indicates no known allergies.  Home Medications   Current Outpatient Rx  Name  Route  Sig  Dispense  Refill  . albuterol (PROVENTIL HFA;VENTOLIN HFA) 108 (90 BASE) MCG/ACT inhaler   Inhalation   Inhale 2 puffs into the lungs every 6 (six) hours as needed. For breathing         . clonazePAM (KLONOPIN) 0.5 MG tablet   Oral   Take 1 tablet (0.5 mg total) by mouth 2 (two) times daily as needed for anxiety.   30 tablet   0   . gabapentin (NEURONTIN) 300 MG capsule   Oral   Take 1 capsule (300 mg total) by mouth  2 (two) times daily with a meal.   60 capsule   0   . ibuprofen (ADVIL,MOTRIN) 600 MG tablet   Oral   Take 1 tablet (600 mg total) by mouth every 6 (six) hours as needed for pain.   30 tablet   0   . PARoxetine (PAXIL) 40 MG tablet   Oral   Take 1 tablet (40 mg total) by mouth every morning.   30 tablet   0   . QUEtiapine (SEROQUEL) 400 MG tablet   Oral    Take 1 tablet (400 mg total) by mouth at bedtime.   30 tablet   0   . traZODone (DESYREL) 100 MG tablet   Oral   Take 1 tablet (100 mg total) by mouth at bedtime.   30 tablet   0   . ziprasidone (GEODON) 40 MG capsule   Oral   Take 40 mg by mouth 2 (two) times daily with a meal.         . amoxicillin-clavulanate (AUGMENTIN) 875-125 MG per tablet   Oral   Take 1 tablet by mouth 2 (two) times daily. One po bid x 7 days   14 tablet   0     BP 118/67  Pulse 87  Temp(Src) 98.9 F (37.2 C) (Oral)  Resp 18  SpO2 97%  Physical Exam  Nursing note and vitals reviewed. Constitutional: He is oriented to person, place, and time. He appears well-developed and well-nourished. No distress.  HENT:  Head: Normocephalic and atraumatic.  Mouth/Throat: Uvula is midline, oropharynx is clear and moist and mucous membranes are normal. He does not have dentures. No dental abscesses, edematous or lacerations. No oropharyngeal exudate, posterior oropharyngeal edema, posterior oropharyngeal erythema or tonsillar abscesses.  Uvula midline, symmetrical elevation  Negative peritonsillar abscess noted Negative "hot potato" voice  Facial swelling noted to the right side of the face - negative erythema, rash, lesions noted. Negative lesions, abscess, cysts, abrasions, erythema note to buccal mucosa bilaterally. Negative dental abnormalities noted - negative cracked teeth. Negative sublingual lesions and pain upon palpation. Positive making of saliva. Pain upon palpation localized to the right cheek.   Eyes: Conjunctivae and EOM are normal. Pupils are equal, round, and reactive to light. Right eye exhibits no discharge. Left eye exhibits no discharge.  EOMs intact without pain  Neck: Normal range of motion. Neck supple. No tracheal deviation present.  Negative nuchal rigidity Negative neck stiffness Negative lymphadenopathy  Cardiovascular: Normal rate, regular rhythm and normal heart sounds.  Exam  reveals no friction rub.   No murmur heard. Radial pulses 2+ bilaterally   Pulmonary/Chest: Effort normal and breath sounds normal. No respiratory distress. He has no wheezes. He has no rales.  Lymphadenopathy:    He has no cervical adenopathy.  Neurological: He is alert and oriented to person, place, and time. No cranial nerve deficit. He exhibits normal muscle tone. Coordination normal. GCS eye subscore is 4. GCS verbal subscore is 5. GCS motor subscore is 6.  Cranial nerves III-XII grossly intact  Skin: Skin is warm and dry. No rash noted. He is not diaphoretic. No erythema.  Psychiatric: He has a normal mood and affect. His behavior is normal. Thought content normal.    ED Course  Procedures (including critical care time)  Labs Reviewed - No data to display No results found.   1. Parotitis   2. HTN (hypertension)   3. Depression   4. Bipolar 1 disorder   5. Asthma  MDM  Patient afebrile, normotensive, non-tachycardic, alert and oriented. Less likely to be peritonsillar abscess, dental abscess, Ludwig's angina. Suspicious for parotitis. Discussed case with Dr. Jinger Neighbors - Dr. Jinger Neighbors to see patient. Agreed with possible parotitis. Patient aseptic, non-toxic appearing, in no acute distress. Patient has no difficulty swallowing, no difficulty with breathing. Discharged patient. Discharged patient with antibiotics. Discussed with patient to continue using Tylenol and Ibuprofen as when needed for pain and discomfort. Discussed with patient to apply hot compressions and to massage the right side of the check. Referred to ENT, Dr. Pollyann Kennedy, for outpatient follow-up. Discussed with patient to monitor symptoms and if symptoms are to worsen or change to please report back to the ED. Resource guide given. Patient agreed to plan of care, understood, all questions answered.           Raymon Mutton, PA-C 10/17/12 1704

## 2012-11-18 ENCOUNTER — Encounter (HOSPITAL_COMMUNITY): Payer: Self-pay | Admitting: *Deleted

## 2012-11-18 ENCOUNTER — Emergency Department (HOSPITAL_COMMUNITY): Payer: Self-pay

## 2012-11-18 ENCOUNTER — Emergency Department (HOSPITAL_COMMUNITY)
Admission: EM | Admit: 2012-11-18 | Discharge: 2012-11-19 | Disposition: A | Discharge status: Other Acute Inpatient Hospital | Payer: Self-pay | Attending: Emergency Medicine | Admitting: Emergency Medicine

## 2012-11-18 DIAGNOSIS — Z87891 Personal history of nicotine dependence: Secondary | ICD-10-CM | POA: Insufficient documentation

## 2012-11-18 DIAGNOSIS — I10 Essential (primary) hypertension: Secondary | ICD-10-CM | POA: Insufficient documentation

## 2012-11-18 DIAGNOSIS — J45909 Unspecified asthma, uncomplicated: Secondary | ICD-10-CM | POA: Insufficient documentation

## 2012-11-18 DIAGNOSIS — Z8679 Personal history of other diseases of the circulatory system: Secondary | ICD-10-CM | POA: Insufficient documentation

## 2012-11-18 DIAGNOSIS — M545 Low back pain: Secondary | ICD-10-CM

## 2012-11-18 DIAGNOSIS — Z79899 Other long term (current) drug therapy: Secondary | ICD-10-CM | POA: Insufficient documentation

## 2012-11-18 DIAGNOSIS — F319 Bipolar disorder, unspecified: Secondary | ICD-10-CM | POA: Insufficient documentation

## 2012-11-18 DIAGNOSIS — F1010 Alcohol abuse, uncomplicated: Secondary | ICD-10-CM

## 2012-11-18 DIAGNOSIS — R45851 Suicidal ideations: Secondary | ICD-10-CM | POA: Insufficient documentation

## 2012-11-18 DIAGNOSIS — IMO0002 Reserved for concepts with insufficient information to code with codable children: Secondary | ICD-10-CM | POA: Insufficient documentation

## 2012-11-18 DIAGNOSIS — F329 Major depressive disorder, single episode, unspecified: Secondary | ICD-10-CM

## 2012-11-18 DIAGNOSIS — R45850 Homicidal ideations: Secondary | ICD-10-CM | POA: Insufficient documentation

## 2012-11-18 LAB — COMPREHENSIVE METABOLIC PANEL
AST: 16 U/L (ref 0–37)
Albumin: 3.6 g/dL (ref 3.5–5.2)
BUN: 14 mg/dL (ref 6–23)
Calcium: 9.1 mg/dL (ref 8.4–10.5)
Chloride: 104 mEq/L (ref 96–112)
Creatinine, Ser: 0.81 mg/dL (ref 0.50–1.35)
Total Bilirubin: 1.3 mg/dL — ABNORMAL HIGH (ref 0.3–1.2)
Total Protein: 6.5 g/dL (ref 6.0–8.3)

## 2012-11-18 LAB — CBC WITH DIFFERENTIAL/PLATELET
Basophils Absolute: 0 10*3/uL (ref 0.0–0.1)
Basophils Relative: 0 % (ref 0–1)
Eosinophils Absolute: 0.2 10*3/uL (ref 0.0–0.7)
Eosinophils Relative: 2 % (ref 0–5)
HCT: 42.2 % (ref 39.0–52.0)
Hemoglobin: 14 g/dL (ref 13.0–17.0)
MCH: 29.9 pg (ref 26.0–34.0)
MCHC: 33.2 g/dL (ref 30.0–36.0)
MCV: 90.2 fL (ref 78.0–100.0)
Monocytes Absolute: 0.7 10*3/uL (ref 0.1–1.0)
Monocytes Relative: 10 % (ref 3–12)
RDW: 13.7 % (ref 11.5–15.5)

## 2012-11-18 LAB — RAPID URINE DRUG SCREEN, HOSP PERFORMED
Amphetamines: NOT DETECTED
Cocaine: NOT DETECTED
Opiates: NOT DETECTED

## 2012-11-18 LAB — ETHANOL: Alcohol, Ethyl (B): <11 mg/dL (ref 0–11)

## 2012-11-18 MED ORDER — ZOLPIDEM TARTRATE 5 MG PO TABS: 5.0000 mg | ORAL_TABLET | Freq: Every evening | ORAL | Status: DC | PRN

## 2012-11-18 MED ORDER — ONDANSETRON HCL 4 MG PO TABS: 4.0000 mg | ORAL_TABLET | Freq: Three times a day (TID) | ORAL | Status: DC | PRN

## 2012-11-18 MED ORDER — PAROXETINE HCL 20 MG PO TABS
40.0000 mg | ORAL_TABLET | ORAL | Status: DC
  Administered 2012-11-18 – 2012-11-19 (×2): 40 mg via ORAL
  Filled 2012-11-18 (×3): qty 2

## 2012-11-18 MED ORDER — ALBUTEROL SULFATE HFA 108 (90 BASE) MCG/ACT IN AERS: 2.0000 | INHALATION_SPRAY | Freq: Four times a day (QID) | RESPIRATORY_TRACT | Status: DC | PRN

## 2012-11-18 MED ORDER — ALUM & MAG HYDROXIDE-SIMETH 200-200-20 MG/5ML PO SUSP: 30.0000 mL | ORAL | Status: DC | PRN

## 2012-11-18 MED ORDER — GABAPENTIN 300 MG PO CAPS
300.0000 mg | ORAL_CAPSULE | Freq: Two times a day (BID) | ORAL | Status: DC
  Administered 2012-11-18 – 2012-11-19 (×4): 300 mg via ORAL
  Filled 2012-11-18 (×5): qty 1

## 2012-11-18 MED ORDER — ACETAMINOPHEN 325 MG PO TABS
650.0000 mg | ORAL_TABLET | ORAL | Status: DC | PRN
  Administered 2012-11-18: 650 mg via ORAL
  Filled 2012-11-18: qty 2

## 2012-11-18 MED ORDER — QUETIAPINE FUMARATE 300 MG PO TABS
400.0000 mg | ORAL_TABLET | Freq: Every day | ORAL | Status: DC
  Administered 2012-11-18: 400 mg via ORAL
  Filled 2012-11-18: qty 1

## 2012-11-18 MED ORDER — ZIPRASIDONE HCL 20 MG PO CAPS
40.0000 mg | ORAL_CAPSULE | Freq: Two times a day (BID) | ORAL | Status: DC
  Administered 2012-11-18 – 2012-11-19 (×3): 40 mg via ORAL
  Filled 2012-11-18 (×3): qty 2

## 2012-11-18 MED ORDER — NICOTINE 21 MG/24HR TD PT24: 21.0000 mg | MEDICATED_PATCH | Freq: Every day | TRANSDERMAL | Status: DC

## 2012-11-18 NOTE — Progress Notes (Signed)
[facility] has seen patient and provided him with a OC application. ° °
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
## 2012-11-18 NOTE — Progress Notes (Signed)
Pt referred to [facility], no beds available and to try again tomorrow.  Pt referred to [facility] pending review.    Catha Gosselin, LCSWA  [phone] .11/18/2012 1421pm

## 2012-11-18 NOTE — Consult Note (Addendum)
Reason for Consult: Major depressive D/O  Without Psychosis Referring Physician: DACARI BECKSTRAND is an 32 y.o. [name].  HPI: Patient is a [name] [name] who was cleared in the main ER to be seen here in [facility] for suicidal ideation and severe depression.  He reports feeling more depressed lately because his roommates and coworker had accused him of stealing from them.  Patient states he felt really sad because he was accused falsely and he want to "end" it all  He also stated he stopped taking all of his mental health medications since September last year.  During the interview, he had good eye contact and answered all questions promptly.  He is moderately groomed and his affect is congruent with his depressed mood.  He is willing to be admitted to our inpatient unit for safety and stabilization.  Past Medical History  Diagnosis Date  . Mental disorder   . Hypertension   . Depression   . Bipolar 1 disorder   . Irregular heart rate   . Asthma     History reviewed. No pertinent past surgical history.  Family History  Problem Relation Age of Onset  . Hypertension Mother   . Hypertension Other     Social History:  reports that he has quit smoking. He has never used smokeless tobacco. He reports that  drinks alcohol. He reports that he does not use illicit drugs.  Allergies: No Known Allergies  Medications: I have reviewed the patient's current medications.  Results for orders placed during the hospital encounter of 11/18/12 (from the past 48 hour(s))  CBC WITH DIFFERENTIAL     Status: None   Collection Time    11/18/12  9:35 AM      Result Value Range   WBC 7.1  4.0 - 10.5 K/uL   RBC 4.68  4.22 - 5.81 MIL/uL   Hemoglobin 14.0  13.0 - 17.0 g/dL   HCT 16.1  09.6 - 04.5 %   MCV 90.2  78.0 - 100.0 fL   MCH 29.9  26.0 - 34.0 pg   MCHC 33.2  30.0 - 36.0 g/dL   RDW 40.9  81.1 - 91.4 %   Platelets 273  150 - 400 K/uL   Neutrophils Relative % 66  43 - 77 %    Neutro Abs 4.7  1.7 - 7.7 K/uL   Lymphocytes Relative 22  12 - 46 %   Lymphs Abs 1.5  0.7 - 4.0 K/uL   Monocytes Relative 10  3 - 12 %   Monocytes Absolute 0.7  0.1 - 1.0 K/uL   Eosinophils Relative 2  0 - 5 %   Eosinophils Absolute 0.2  0.0 - 0.7 K/uL   Basophils Relative 0  0 - 1 %   Basophils Absolute 0.0  0.0 - 0.1 K/uL  COMPREHENSIVE METABOLIC PANEL     Status: Abnormal   Collection Time    11/18/12  9:35 AM      Result Value Range   Sodium 139  135 - 145 mEq/L   Potassium 4.1  3.5 - 5.1 mEq/L   Chloride 104  96 - 112 mEq/L   CO2 28  19 - 32 mEq/L   Glucose, Bld 97  70 - 99 mg/dL   BUN 14  6 - 23 mg/dL   Creatinine, Ser 7.[name]  0.50 - 1.35 mg/dL   Calcium 9.1  8.4 - 95.6 mg/dL   Total Protein 6.5  6.0 -  8.3 g/dL   Albumin 3.6  3.5 - 5.2 g/dL   AST 16  0 - 37 U/L   ALT 19  0 - 53 U/L   Alkaline Phosphatase 96  39 - 117 U/L   Total Bilirubin 1.3 (*) 0.3 - 1.2 mg/dL   GFR calc non Af Amer >90  >90 mL/min   GFR calc Af Amer >90  >90 mL/min   Comment:            The eGFR has been calculated     using the CKD EPI equation.     This calculation has not been     validated in all clinical     situations.     eGFR's persistently     <90 mL/min signify     possible Chronic Kidney Disease.  ETHANOL     Status: None   Collection Time    11/18/12  9:35 AM      Result Value Range   Alcohol, Ethyl (B) <11  0 - 11 mg/dL   Comment:            LOWEST DETECTABLE LIMIT FOR     SERUM ALCOHOL IS 11 mg/dL     FOR MEDICAL PURPOSES ONLY  URINE RAPID DRUG SCREEN (HOSP PERFORMED)     Status: None   Collection Time    11/18/12 10:12 AM      Result Value Range   Opiates NONE DETECTED  NONE DETECTED   Cocaine NONE DETECTED  NONE DETECTED   Benzodiazepines NONE DETECTED  NONE DETECTED   Amphetamines NONE DETECTED  NONE DETECTED   Tetrahydrocannabinol NONE DETECTED  NONE DETECTED   Barbiturates NONE DETECTED  NONE DETECTED   Comment:            DRUG SCREEN FOR MEDICAL PURPOSES     ONLY.   IF CONFIRMATION IS NEEDED     FOR ANY PURPOSE, NOTIFY LAB     WITHIN 5 DAYS.                LOWEST DETECTABLE LIMITS     FOR URINE DRUG SCREEN     Drug Class       Cutoff (ng/mL)     Amphetamine      1000     Barbiturate      200     Benzodiazepine   200     Tricyclics       300     Opiates          300     Cocaine          300     THC              50    Dg Lumbar Spine Complete  11/18/2012   *RADIOLOGY REPORT*  Clinical Data: Mid to low back pain following injury 4 days ago.  LUMBAR SPINE - COMPLETE 4+ VIEW  Comparison: Acute abdominal series 02/11/2011.  Findings: There are five lumbar type vertebral bodies.  The alignment stable with a convex right scoliosis centered at L1-L2. The lateral alignment is normal.  The disc spaces are preserved. There is no evidence of acute fracture or pars defect.  IMPRESSION: No evidence of acute lumbar spine injury.  Stable convex right scoliosis.   Original Report Authenticated By: Carey Bullocks, M.D.    Review of Systems  Constitutional: Negative.  Negative for fever, chills, weight loss, malaise/fatigue and diaphoresis.  HENT: Negative.  Negative for hearing  loss, ear pain, nosebleeds, congestion, sore throat, neck pain, tinnitus and ear discharge.   Eyes: Negative.  Negative for blurred vision, double vision, photophobia, pain, discharge and redness.  Respiratory: Negative.  Negative for cough, hemoptysis, sputum production, shortness of breath, wheezing and stridor.   Cardiovascular: Negative.  Negative for chest pain, palpitations, orthopnea, claudication, leg swelling and PND.  Gastrointestinal: Negative.  Negative for heartburn, nausea, vomiting, abdominal pain, diarrhea, constipation, blood in stool and melena.  Genitourinary: Negative.  Negative for dysuria, urgency, frequency, hematuria and flank pain.  Musculoskeletal: Negative.  Negative for myalgias, back pain, joint pain and falls.  Skin: Negative.  Negative for itching and rash.   Neurological: Negative.  Negative for dizziness, tingling, tremors, sensory change, speech change, focal weakness, seizures, loss of consciousness, weakness and headaches.  Endo/Heme/Allergies: Negative.   Psychiatric/Behavioral: Positive for depression (Reports depression 10/10) and suicidal ideas (Endorses suicide and is unable to contract for safety). Negative for hallucinations, memory loss and substance abuse. The patient is not nervous/anxious and does not have insomnia.    Blood pressure 121/76, pulse 65, temperature 98.5 F (36.9 C), temperature source Oral, resp. rate 20, SpO2 99.00%. Physical Exam  Constitutional: He is oriented to person, place, and time. He appears well-developed and well-nourished. No distress.  HENT:  Head: Normocephalic and atraumatic.  Eyes: Conjunctivae and EOM are normal.  Neck: Normal range of motion. Neck supple. No JVD present. No tracheal deviation present. No thyromegaly present.  Cardiovascular: Normal rate, regular rhythm, normal heart sounds and intact distal pulses.  Exam reveals no friction rub.   Respiratory: Effort normal and breath sounds normal. No respiratory distress. He has no wheezes. He has no rales. He exhibits no tenderness.  GI: Soft. Bowel sounds are normal. He exhibits no distension and no mass. There is no tenderness. There is no rebound and no guarding.  Musculoskeletal: Normal range of motion. He exhibits no edema.  Lymphadenopathy:    He has no cervical adenopathy.  Neurological: He is alert and oriented to person, place, and time. He has normal reflexes. He displays normal reflexes. No cranial nerve deficit. He exhibits normal muscle tone. Coordination normal.  Skin: Skin is dry. No rash noted. No erythema. No pallor.    Assessment/Plan:  We will admit to our inpatient psychiatric unit when bed is available or refer to another inpatient facility for safety and stability. We will restart him on his psychiatric medications. We  will manage and treat current symptoms to assist him get back to his baseline functioning.   Dahlia Byes, C    PMHNP-BC  11/18/2012, 3:38 PM   I have  reviewed the note and agree with the above findings and plan.  Jacqulyn Cane, M.D.  11/18/2012 4:47 PM  . Follow up consult for inpatient treatment:  Face to face interview and consult with Dr. Lolly Mustache Patient states that he had already feeling depressed for a while and started to feel worse after accusations from friends and family that he was stealing and couldn't take it anymore.  "I starting feeling bad and wanted to kill myself and hurt others."    In agreement with previous assessment/plan for inpatient treatment.  Previous medications already started.  Shuvon B. Rankin FNP-BC Family Nurse Practitioner, Board Certified 11/19/2012 10:28 AMI personally seen the patient agreed with the findings and involved in the treatment plan.

## 2012-11-18 NOTE — ED Provider Notes (Signed)
Medical screening examination/treatment/procedure(s) were performed by non-physician practitioner and as supervising physician I was immediately available for consultation/collaboration.    Celene Kras, MD 11/18/12 [phone]

## 2012-11-18 NOTE — Progress Notes (Addendum)
Pt referred to [facility] pending review.  Pt referred to [facility], pending review.  Pt referred to [facility]ending review.   No beds available at [place], [place], [place], [place], [place], [place], [place], [place], [place], and [place].   Catha Gosselin, LCSWA  [phone] .11/18/2012 1506pm

## 2012-11-19 ENCOUNTER — Encounter (HOSPITAL_COMMUNITY): Payer: Self-pay | Admitting: Registered Nurse

## 2012-11-19 DIAGNOSIS — F1010 Alcohol abuse, uncomplicated: Secondary | ICD-10-CM

## 2012-11-19 DIAGNOSIS — R45850 Homicidal ideations: Secondary | ICD-10-CM

## 2012-11-19 NOTE — Progress Notes (Signed)
Pt accepted to [facility] by Dr. Chryl Heck, nurse can call report to [phone] once patient is under ivc. Fax ivc to fax #  [phone]    .[name] [name], [name] [name]  [phone] .11/19/2012 1205pm   .

## 2012-11-19 NOTE — Progress Notes (Signed)
CSW spoke with [facility] at [facility] who stated that she apologized for the confusion. Patient is being reviewed at [facility] at this time but they do have beds available.   Catha Gosselin, LCSWA  [phone] .11/19/2012 1046am

## 2012-11-19 NOTE — ED Notes (Signed)
IVC support paperwork sent to [facility] at [facility] per her request.

## 2012-11-19 NOTE — BHH Counselor (Signed)
Pt referred to [facility] yesterday. Today patient was reportedly accepted to [facility] per SW-Kristen. Writer contacted [facility] and spoke to [employer] Journalist, newspaper). Sts that she is not aware of patient's acceptance to [facility]. She looked through her packet of faxed referrals and stated she does not have patient's referral. .

## 2012-11-19 NOTE — ED Provider Notes (Signed)
Patient presented yesterday for evaluation of increasing depression, suicidal ideation and homicidal ideation. Patient resting comfortably this morning, no overnight problems. Continue current treatment plan, placement when available.  Filed Vitals:   11/19/12 0556  BP: 114/71  Pulse: 69  Temp: 97.2 F (36.2 C)  Resp: 18     Gilda Crease, MD 11/19/12 [phone]

## 2012-12-02 ENCOUNTER — Encounter (HOSPITAL_COMMUNITY): Payer: Self-pay | Admitting: *Deleted

## 2012-12-02 ENCOUNTER — Emergency Department (HOSPITAL_COMMUNITY)
Admission: EM | Admit: 2012-12-02 | Discharge: 2012-12-04 | Disposition: A | Discharge status: Other Acute Inpatient Hospital | Payer: Self-pay | Attending: Emergency Medicine | Admitting: Emergency Medicine

## 2012-12-02 DIAGNOSIS — J45909 Unspecified asthma, uncomplicated: Secondary | ICD-10-CM | POA: Insufficient documentation

## 2012-12-02 DIAGNOSIS — Z79899 Other long term (current) drug therapy: Secondary | ICD-10-CM | POA: Insufficient documentation

## 2012-12-02 DIAGNOSIS — F319 Bipolar disorder, unspecified: Secondary | ICD-10-CM | POA: Insufficient documentation

## 2012-12-02 DIAGNOSIS — R45850 Homicidal ideations: Secondary | ICD-10-CM | POA: Insufficient documentation

## 2012-12-02 DIAGNOSIS — Z8679 Personal history of other diseases of the circulatory system: Secondary | ICD-10-CM | POA: Insufficient documentation

## 2012-12-02 DIAGNOSIS — F329 Major depressive disorder, single episode, unspecified: Secondary | ICD-10-CM

## 2012-12-02 DIAGNOSIS — I10 Essential (primary) hypertension: Secondary | ICD-10-CM | POA: Insufficient documentation

## 2012-12-02 DIAGNOSIS — F32A Depression, unspecified: Secondary | ICD-10-CM

## 2012-12-02 DIAGNOSIS — F3289 Other specified depressive episodes: Secondary | ICD-10-CM | POA: Insufficient documentation

## 2012-12-02 DIAGNOSIS — Z87891 Personal history of nicotine dependence: Secondary | ICD-10-CM | POA: Insufficient documentation

## 2012-12-02 DIAGNOSIS — R45851 Suicidal ideations: Secondary | ICD-10-CM | POA: Insufficient documentation

## 2012-12-02 LAB — CBC WITH DIFFERENTIAL/PLATELET
Basophils Absolute: 0 10*3/uL (ref 0.0–0.1)
Basophils Relative: 0 % (ref 0–1)
Eosinophils Absolute: 0.1 10*3/uL (ref 0.0–0.7)
Eosinophils Relative: 1 % (ref 0–5)
HCT: 41.2 % (ref 39.0–52.0)
Hemoglobin: 13.9 g/dL (ref 13.0–17.0)
Lymphocytes Relative: 20 % (ref 12–46)
Lymphs Abs: 2 10*3/uL (ref 0.7–4.0)
MCH: 30 pg (ref 26.0–34.0)
MCHC: 33.7 g/dL (ref 30.0–36.0)
MCV: 88.8 fL (ref 78.0–100.0)
Monocytes Absolute: 1.1 10*3/uL — ABNORMAL HIGH (ref 0.1–1.0)
Monocytes Relative: 11 % (ref 3–12)
Neutro Abs: 7 10*3/uL (ref 1.7–7.7)
Neutrophils Relative %: 68 % (ref 43–77)
Platelets: 265 10*3/uL (ref 150–400)
RBC: 4.64 MIL/uL (ref 4.22–5.81)
RDW: 13.1 % (ref 11.5–15.5)
WBC: 10.3 10*3/uL (ref 4.0–10.5)

## 2012-12-02 NOTE — ED Notes (Signed)
Pt reports about an hour ago, his tongue started to feel tingly, he started to feel dizzy and weak.  Pt reports he's been taking haldol x 2 weeks now.  Pt was ambulatory without difficulty with steady gait upon arrival to the ED.  Pt denies any pain at this time.

## 2012-12-02 NOTE — ED Notes (Signed)
[id number]<AV> Expected date:<BR> Expected time:<BR> Means of arrival:<BR> Comments:<BR> EMS/32 yo male with behavior issues-started on Haldol-has EPS symptoms tonight

## 2012-12-02 NOTE — ED Notes (Signed)
Per EMS, pt from home, reports not feeling well after taking haldol.  Pt reports he's been on haldol x 1 week.  Has no other sxs other than not feeling well per EMS.

## 2012-12-02 NOTE — ED Notes (Signed)
Pt reports SI/plan to jump off of a bridge.  Pt states that he had tried to tell his doctor this am but "he would not listen and sent me home instead."

## 2012-12-03 DIAGNOSIS — F329 Major depressive disorder, single episode, unspecified: Secondary | ICD-10-CM

## 2012-12-03 LAB — COMPREHENSIVE METABOLIC PANEL
ALT: 42 U/L (ref 0–53)
AST: 25 U/L (ref 0–37)
Albumin: 3.5 g/dL (ref 3.5–5.2)
Alkaline Phosphatase: 87 U/L (ref 39–117)
BUN: 21 mg/dL (ref 6–23)
CO2: 29 mEq/L (ref 19–32)
Calcium: 9.5 mg/dL (ref 8.4–10.5)
Chloride: 99 mEq/L (ref 96–112)
Creatinine, Ser: 0.78 mg/dL (ref 0.50–1.35)
GFR calc Af Amer: >90 mL/min (ref >90)
GFR calc non Af Amer: >90 mL/min (ref >90)
Glucose, Bld: 86 mg/dL (ref 70–99)
Potassium: 3.8 mEq/L (ref 3.5–5.1)
Sodium: 137 mEq/L (ref 135–145)
Total Bilirubin: 0.4 mg/dL (ref 0.3–1.2)
Total Protein: 6.6 g/dL (ref 6.0–8.3)

## 2012-12-03 LAB — URINALYSIS, ROUTINE W REFLEX MICROSCOPIC
Bilirubin Urine: NEGATIVE
Glucose, UA: NEGATIVE mg/dL
Hgb urine dipstick: NEGATIVE
Ketones, ur: NEGATIVE mg/dL
Leukocytes, UA: NEGATIVE
Nitrite: NEGATIVE
Protein, ur: NEGATIVE mg/dL
Specific Gravity, Urine: 1.031 — ABNORMAL HIGH (ref 1.005–1.030)
Urobilinogen, UA: 0.2 mg/dL (ref 0.0–1.0)
pH: 6 (ref 5.0–8.0)

## 2012-12-03 LAB — RAPID URINE DRUG SCREEN, HOSP PERFORMED
Amphetamines: NOT DETECTED
Barbiturates: NOT DETECTED
Benzodiazepines: NOT DETECTED
Cocaine: NOT DETECTED
Opiates: NOT DETECTED
Tetrahydrocannabinol: NOT DETECTED

## 2012-12-03 LAB — ETHANOL: Alcohol, Ethyl (B): <11 mg/dL (ref 0–11)

## 2012-12-03 MED ORDER — ZOLPIDEM TARTRATE 5 MG PO TABS: 5.0000 mg | ORAL_TABLET | Freq: Every evening | ORAL | Status: DC | PRN

## 2012-12-03 MED ORDER — QUETIAPINE FUMARATE 50 MG PO TABS
50.0000 mg | ORAL_TABLET | Freq: Two times a day (BID) | ORAL | Status: DC
  Administered 2012-12-03 (×2): 50 mg via ORAL
  Filled 2012-12-03 (×3): qty 1

## 2012-12-03 MED ORDER — LORAZEPAM 1 MG PO TABS: 1.0000 mg | ORAL_TABLET | Freq: Three times a day (TID) | ORAL | Status: DC | PRN

## 2012-12-03 MED ORDER — ACETAMINOPHEN 325 MG PO TABS: 650.0000 mg | ORAL_TABLET | ORAL | Status: DC | PRN

## 2012-12-03 MED ORDER — ONDANSETRON HCL 4 MG PO TABS: 4.0000 mg | ORAL_TABLET | Freq: Three times a day (TID) | ORAL | Status: DC | PRN

## 2012-12-03 MED ORDER — QUETIAPINE FUMARATE 100 MG PO TABS
100.0000 mg | ORAL_TABLET | Freq: Every day | ORAL | Status: DC
  Administered 2012-12-03: 100 mg via ORAL
  Filled 2012-12-03: qty 1

## 2012-12-03 MED ORDER — ALUM & MAG HYDROXIDE-SIMETH 200-200-20 MG/5ML PO SUSP: 30.0000 mL | ORAL | Status: DC | PRN

## 2012-12-03 MED ORDER — IBUPROFEN 600 MG PO TABS: 600.0000 mg | ORAL_TABLET | Freq: Three times a day (TID) | ORAL | Status: DC | PRN

## 2012-12-03 MED ORDER — NICOTINE 21 MG/24HR TD PT24
21.0000 mg | MEDICATED_PATCH | Freq: Every day | TRANSDERMAL | Status: DC
  Administered 2012-12-03: 21 mg via TRANSDERMAL
  Filled 2012-12-03: qty 1

## 2012-12-03 MED ORDER — ALBUTEROL SULFATE HFA 108 (90 BASE) MCG/ACT IN AERS: 2.0000 | INHALATION_SPRAY | Freq: Four times a day (QID) | RESPIRATORY_TRACT | Status: DC | PRN

## 2012-12-03 MED ORDER — HALOPERIDOL 5 MG PO TABS
5.0000 mg | ORAL_TABLET | Freq: Two times a day (BID) | ORAL | Status: DC
  Administered 2012-12-03: 10 mg via ORAL
  Filled 2012-12-03: qty 2

## 2012-12-03 MED ORDER — PAROXETINE HCL 20 MG PO TABS
80.0000 mg | ORAL_TABLET | Freq: Every morning | ORAL | Status: DC
  Administered 2012-12-03: 80 mg via ORAL
  Filled 2012-12-03: qty 4

## 2012-12-03 MED ORDER — TRAZODONE HCL 100 MG PO TABS
200.0000 mg | ORAL_TABLET | Freq: Every day | ORAL | Status: DC
  Administered 2012-12-03 (×2): 200 mg via ORAL
  Filled 2012-12-03 (×2): qty 2

## 2012-12-03 NOTE — Consult Note (Signed)
[facility] Psychiatry Consult   Reason for Consult:  Suicidal thinking Referring Physician:  Breckyn [name] is an 32 y.o. male.  Assessment: AXIS I:  Major Depression, Recurrent severe AXIS II:  Deferred AXIS III:   Past Medical History  Diagnosis Date  . Mental disorder   . Hypertension   . Depression   . Bipolar 1 disorder   . Irregular heart rate   . Asthma    AXIS IV:  other psychosocial or environmental problems and problems with primary support group AXIS V:  11-20 some danger of hurting self or others possible OR occasionally fails to maintain minimal personal hygiene OR gross impairment in communication  Plan:  Admit to inpatient treatment and stabilization.  Subjective:   [name] [name] is a 32 y.o. male patient admitted with suicidal thinking.  Patient was in the emergency room on June 17 and he was admitted to [facility]. Patient was discharged yesterday however he continued to endorse suicidal thinking and homicidal thoughts.  Patient told his medications are not working.  He had told his doctor prior to discharge about his suicidal thinking however he was discharged anyway.  He was taking Seroquel and gabapentin from [facility] but he is noncompliant with the medication for past 8 months.  He was given Haldol at [facility].  Patient endorse a lot of psychosocial stressors .  He has issues with her roommate and coworker.  Patient accused things stolen  From patient is not happy where he is living.  Patient also admitted his depression gets worse since he stopped taking his medication.  He endorsed poor sleep , irritability, hallucination, paranoia and suicidal thinking.  Patient endorse suicidal plan by jumping into traffic .  He is not drinking or using any illegal substance.  [facility] over a [facility] ago.    HPI:  See above  HPI Elements:   Location:  Emergency room. Quality:  Unable to function. Severity:   Severe. Duration:  For past 8 months.  Past Psychiatric History: Past Medical History  Diagnosis Date  . Mental disorder   . Hypertension   . Depression   . Bipolar 1 disorder   . Irregular heart rate   . Asthma     reports that he has quit smoking. He has never used smokeless tobacco. He reports that  drinks alcohol. He reports that he does not use illicit drugs. Family History  Problem Relation Age of Onset  . Hypertension Mother   . Hypertension Other            Allergies:  No Known Allergies  Past Psychiatric History: Diagnosis:   Maj. depressive disorder  Hospitalizations:   yes in the past.  Recently discharged from [facility].  Outpatient Care:   [facility]   Substance Abuse Care:   no current use  Self-Mutilation:   none  Suicidal Attempts:   yes   Violent Behaviors:   unknown    Objective: Blood pressure 110/74, pulse 84, temperature 97.7 F (36.5 C), temperature source Oral, resp. rate 18, height 6\' 1"  (1.854 m), weight 129.275 kg (285 lb), SpO2 95.00%.Body mass index is 37.61 kg/(m^2). Results for orders placed during the hospital encounter of 12/02/12 (from the past 72 hour(s))  CBC WITH DIFFERENTIAL     Status: Abnormal   Collection Time    12/02/12 11:41 PM      Result Value Range   WBC 10.3  4.0 - 10.5 K/uL  RBC 4.64  4.22 - 5.81 MIL/uL   Hemoglobin 13.9  13.0 - 17.0 g/dL   HCT 16.1  09.6 - 04.5 %   MCV 88.8  78.0 - 100.0 fL   MCH 30.0  26.0 - 34.0 pg   MCHC 33.7  30.0 - 36.0 g/dL   RDW 40.9  81.1 - 91.4 %   Platelets 265  150 - 400 K/uL   Neutrophils Relative % 68  43 - 77 %   Neutro Abs 7.0  1.7 - 7.7 K/uL   Lymphocytes Relative 20  12 - 46 %   Lymphs Abs 2.0  0.7 - 4.0 K/uL   Monocytes Relative 11  3 - 12 %   Monocytes Absolute 1.1 (*) 0.1 - 1.0 K/uL   Eosinophils Relative 1  0 - 5 %   Eosinophils Absolute 0.1  0.0 - 0.7 K/uL   Basophils Relative 0  0 - 1 %   Basophils Absolute 0.0  0.0 - 0.1 K/uL  COMPREHENSIVE METABOLIC PANEL      Status: None   Collection Time    12/02/12 11:41 PM      Result Value Range   Sodium 137  135 - 145 mEq/L   Potassium 3.8  3.5 - 5.1 mEq/L   Chloride 99  96 - 112 mEq/L   CO2 29  19 - 32 mEq/L   Glucose, Bld 86  70 - 99 mg/dL   BUN 21  6 - 23 mg/dL   Creatinine, Ser 7.82  0.50 - 1.35 mg/dL   Calcium 9.5  8.4 - 95.6 mg/dL   Total Protein 6.6  6.0 - 8.3 g/dL   Albumin 3.5  3.5 - 5.2 g/dL   AST 25  0 - 37 U/L   ALT 42  0 - 53 U/L   Alkaline Phosphatase 87  39 - 117 U/L   Total Bilirubin 0.4  0.3 - 1.2 mg/dL   GFR calc non Af Amer >90  >90 mL/min   GFR calc Af Amer >90  >90 mL/min   Comment:            The eGFR has been calculated     using the CKD EPI equation.     This calculation has not been     validated in all clinical     situations.     eGFR's persistently     <90 mL/min signify     possible Chronic Kidney Disease.  ETHANOL     Status: None   Collection Time    12/02/12 11:41 PM      Result Value Range   Alcohol, Ethyl (B) <11  0 - 11 mg/dL   Comment:            LOWEST DETECTABLE LIMIT FOR     SERUM ALCOHOL IS 11 mg/dL     FOR MEDICAL PURPOSES ONLY  URINE RAPID DRUG SCREEN (HOSP PERFORMED)     Status: None   Collection Time    12/02/12 11:42 PM      Result Value Range   Opiates NONE DETECTED  NONE DETECTED   Cocaine NONE DETECTED  NONE DETECTED   Benzodiazepines NONE DETECTED  NONE DETECTED   Amphetamines NONE DETECTED  NONE DETECTED   Tetrahydrocannabinol NONE DETECTED  NONE DETECTED   Barbiturates NONE DETECTED  NONE DETECTED   Comment:            DRUG SCREEN FOR MEDICAL PURPOSES     ONLY.  IF  CONFIRMATION IS NEEDED     FOR ANY PURPOSE, NOTIFY LAB     WITHIN 5 DAYS.                LOWEST DETECTABLE LIMITS     FOR URINE DRUG SCREEN     Drug Class       Cutoff (ng/mL)     Amphetamine      1000     Barbiturate      200     Benzodiazepine   200     Tricyclics       300     Opiates          300     Cocaine          300     THC              50   URINALYSIS, ROUTINE W REFLEX MICROSCOPIC     Status: Abnormal   Collection Time    12/02/12 11:42 PM      Result Value Range   Color, Urine YELLOW  YELLOW   APPearance CLEAR  CLEAR   Specific Gravity, Urine 1.031 (*) 1.005 - 1.030   pH 6.0  5.0 - 8.0   Glucose, UA NEGATIVE  NEGATIVE mg/dL   Hgb urine dipstick NEGATIVE  NEGATIVE   Bilirubin Urine NEGATIVE  NEGATIVE   Ketones, ur NEGATIVE  NEGATIVE mg/dL   Protein, ur NEGATIVE  NEGATIVE mg/dL   Urobilinogen, UA 0.2  0.0 - 1.0 mg/dL   Nitrite NEGATIVE  NEGATIVE   Leukocytes, UA NEGATIVE  NEGATIVE   Comment: MICROSCOPIC NOT DONE ON URINES WITH NEGATIVE PROTEIN, BLOOD, LEUKOCYTES, NITRITE, OR GLUCOSE <1000 mg/dL.   Labs are reviewed and are pertinent for  negative drug screen .  Current Facility-Administered Medications  Medication Dose Route Frequency Provider Last Rate Last Dose  . acetaminophen (TYLENOL) tablet 650 mg  650 mg Oral Q4H PRN Raeford Razor, MD      . albuterol (PROVENTIL HFA;VENTOLIN HFA) 108 (90 BASE) MCG/ACT inhaler 2 puff  2 puff Inhalation Q6H PRN Raeford Razor, MD      . alum & mag hydroxide-simeth (MAALOX/MYLANTA) 200-200-20 MG/5ML suspension 30 mL  30 mL Oral PRN Raeford Razor, MD      . ibuprofen (ADVIL,MOTRIN) tablet 600 mg  600 mg Oral Q8H PRN Raeford Razor, MD      . LORazepam (ATIVAN) tablet 1 mg  1 mg Oral Q8H PRN Raeford Razor, MD      . nicotine (NICODERM CQ - dosed in mg/24 hours) patch 21 mg  21 mg Transdermal Daily Raeford Razor, MD      . ondansetron [facility]) tablet 4 mg  4 mg Oral Q8H PRN Raeford Razor, MD      . PARoxetine (PAXIL) tablet 80 mg  80 mg Oral q morning - 10a Raeford Razor, MD      . traZODone (DESYREL) tablet 200 mg  200 mg Oral QHS Raeford Razor, MD   200 mg at 12/03/12 1610   Current Outpatient Prescriptions  Medication Sig Dispense Refill  . albuterol (PROVENTIL HFA;VENTOLIN HFA) 108 (90 BASE) MCG/ACT inhaler Inhale 2 puffs into the lungs every 6 (six) hours as needed for wheezing.       . haloperidol (HALDOL) 5 MG tablet Take 5-10 mg by mouth 2 (two) times daily. 1 tab in am, 2 tab in pm      . PARoxetine (PAXIL) 40 MG tablet Take 80 mg by mouth every  morning.      . traZODone (DESYREL) 100 MG tablet Take 200 mg by mouth at bedtime.      . [DISCONTINUED] buPROPion (WELLBUTRIN XL) 150 MG 24 hr tablet Take 150 mg by mouth at bedtime.         Psychiatric Specialty Exam: Physical Exam  ROS  Blood pressure 110/74, pulse 84, temperature 97.7 F (36.5 C), temperature source Oral, resp. rate 18, height 6\' 1"  (1.854 m), weight 129.275 kg (285 lb), SpO2 95.00%.Body mass index is 37.61 kg/(m^2).  General Appearance: Disheveled  Eye Contact::  Poor  Speech:  Slow  Volume:  Decreased  Mood:  Depressed, Dysphoric, Hopeless, Irritable and Worthless  Affect:  Constricted  Thought Process:  Logical  Orientation:  Full (Time, Place, and Person)  Thought Content:  Rumination  Suicidal Thoughts:  Yes.  with intent/plan  Homicidal Thoughts:  Yes.  without intent/plan  Memory:  Difficulty recalling things  Judgement:  Impaired  Insight:  Lacking  Psychomotor Activity:  Decreased  Concentration:  Fair  Recall:  Fair  Akathisia:  No  Handed:  Right  AIMS (if indicated):     Assets:  Desire for Improvement Housing  Sleep:      Treatment Plan Summary: Patient requires inpatient psychiatric treatment.  We will start Seroquel 50 mg twice a day and 100 mg at bedtime.  We will discontinue Haldol.  Skyelar Swigart T. 12/03/2012 9:10 AM

## 2012-12-03 NOTE — ED Notes (Signed)
Patient medications taken down to pharmacy.

## 2012-12-03 NOTE — ED Notes (Signed)
Report to Ras[place] RN in Psych ED-ready for pt

## 2012-12-03 NOTE — ED Notes (Signed)
Patient medication will sent to behavioral health with patient and security.

## 2012-12-03 NOTE — ED Provider Notes (Signed)
History    32yM with depression. SI and HI although no clear plan for either. Was just discharged from psych facility for same. His meds changed during this hospitalization which he is very unhappy about. Says previously on geodon which seemed to work well for him. He had taken himself off of it and seemed to be doing fairly well for about 8 months prior to him being admitted a couple weeks ago. No somatic complaints.   CSN: [id number] Arrival date & time 12/02/12  2207  First MD Initiated Contact with Patient 12/02/12 2226     Chief Complaint  Patient presents with  . feels funny after taking haldol    (Consider location/radiation/quality/duration/timing/severity/associated sxs/prior Treatment) HPI Past Medical History  Diagnosis Date  . Mental disorder   . Hypertension   . Depression   . Bipolar 1 disorder   . Irregular heart rate   . Asthma    History reviewed. No pertinent past surgical history. Family History  Problem Relation Age of Onset  . Hypertension Mother   . Hypertension Other    History  Substance Use Topics  . Smoking status: Former Smoker -- 0.50 packs/day  . Smokeless tobacco: Never Used  . Alcohol Use: 0.0 oz/week     Comment: occ.    Review of Systems  All systems reviewed and negative, other than as noted in HPI.   Allergies  Review of patient's allergies indicates no known allergies.  Home Medications   Current Outpatient Rx  Name  Route  Sig  Dispense  Refill  . albuterol (PROVENTIL HFA;VENTOLIN HFA) 108 (90 BASE) MCG/ACT inhaler   Inhalation   Inhale 2 puffs into the lungs every 6 (six) hours as needed for wheezing.         . haloperidol (HALDOL) 5 MG tablet   Oral   Take 5-10 mg by mouth 2 (two) times daily. 1 tab in am, 2 tab in pm         . PARoxetine (PAXIL) 40 MG tablet   Oral   Take 80 mg by mouth every morning.         . traZODone (DESYREL) 100 MG tablet   Oral   Take 200 mg by mouth at bedtime.          BP  134/84  Pulse 87  Temp(Src) 97.4 F (36.3 C) (Oral)  Resp 18  Ht 6\' 1"  (1.854 m)  Wt 285 lb (129.275 kg)  BMI 37.61 kg/m2  SpO2 99% Physical Exam  Nursing note and vitals reviewed. Constitutional: He is oriented to person, place, and time. He appears well-developed and well-nourished. No distress.  HENT:  Head: Normocephalic and atraumatic.  Eyes: Conjunctivae are normal. Right eye exhibits no discharge. Left eye exhibits no discharge.  Disconjugate gaze.   Neck: Neck supple.  Cardiovascular: Normal rate, regular rhythm and normal heart sounds.  Exam reveals no gallop and no friction rub.   No murmur heard. Pulmonary/Chest: Effort normal and breath sounds normal. No respiratory distress.  Abdominal: Soft. He exhibits no distension. There is no tenderness.  Musculoskeletal: He exhibits no edema and no tenderness.  Neurological: He is alert and oriented to person, place, and time.  Skin: Skin is warm and dry. He is not diaphoretic.  Psychiatric: His behavior is normal. Thought content normal.  Speech clear. Content appropriate. Does not appear to be responding to internal stimuli. Suspect mild cognitive impairment.     ED Course  Procedures (including critical care time) Labs  Reviewed  CBC WITH DIFFERENTIAL - Abnormal; Notable for the following:    Monocytes Absolute 1.1 (*)    All other components within normal limits  URINALYSIS, ROUTINE W REFLEX MICROSCOPIC - Abnormal; Notable for the following:    Specific Gravity, Urine 1.031 (*)    All other components within normal limits  COMPREHENSIVE METABOLIC PANEL  URINE RAPID DRUG SCREEN (HOSP PERFORMED)  ETHANOL   No results found. 1. Depression     MDM  32 year old male with suicidal ideation without a concrete plan. Patient was discharged from [facility] facility for the same. He has concerns about this recently changed medicines. Patient has been medically cleared. Will obtain psychiatric consultation.  Raeford Razor,  MD 12/03/12 [phone]

## 2012-12-03 NOTE — BH Assessment (Signed)
Assessment Note   [name] [name] is an 32 y.o. male presenting to [name] ED with thoughts of suicide. Pt has a plan to jump into traffic, jump off a bridge, and/or cut her wrist. Patient sts he is unable to contract for safety. His suicidal ideations are triggers by friends, coworkers, and room mates "picking on me and putting me down". Pt has a history of 1 prior suicide attempt in which he cut his wrist. Onset of SI was 3-4 weeks ago.   Patient also homicidal toward his friends, coworkers, and room mates. Patient has no specific plan to harm others. However, unable to to contract from safety against harming others. Onset of HI was 3-4 weeks ago.   Patient reports a history of AVH's. He reports auditory hallucinations-voices telling him to "kill himself" and "others". Patient's voices have been on-going for several weeks.   Pt is frequent visitor to this emerg dept and has past inpt admissions with [facility](2x's), [facility], and [address], dates unk.  Patient was sent by out ACT staff to [facility] in [place] [facility] 11/19/2012-12/02/2012. Pt sts that over the time of his admission his symptoms of SI, HI, and AVH's did not improve, decrease, etc. Patient expresses that that he was frustrated during his entire admission stating that the medications given him did not work. Patient sts he actually feels worse than he did before going to [facility] b/c he is "tired of being tired". Patient has not outpatient mental health providers.   Pt denies current issues with SA. He admits to having a previous problems with alcohol prior to his last hospitalization @ [facility]. Pt was drinking a 12 pack of beer daily. Patient's last alcoholic drink was 2 weeks ago.   Axis I: Bipolar Disorder, Depressed Mood, Psychotic Features  Axis II: Deferred Axis III:  Past Medical History  Diagnosis Date  . Mental disorder   . Hypertension   . Depression   . Bipolar 1 disorder    . Irregular heart rate   . Asthma    Axis IV: other psychosocial or environmental problems, problems related to social environment, problems with access to health care services and problems with primary support group Axis V: 31-40 impairment in reality testing  Past Medical History:  Past Medical History  Diagnosis Date  . Mental disorder   . Hypertension   . Depression   . Bipolar 1 disorder   . Irregular heart rate   . Asthma     History reviewed. No pertinent past surgical history.  Family History:  Family History  Problem Relation Age of Onset  . Hypertension Mother   . Hypertension Other     Social History:  reports that he has quit smoking. He has never used smokeless tobacco. He reports that  drinks alcohol. He reports that he does not use illicit drugs.  Additional Social History:  Alcohol / Drug Use Pain Medications: SEE MAR Prescriptions: SEE MAR Over the Counter: SEE MAR History of alcohol / drug use?: Yes Substance #1 Name of Substance 1: Alcohol  1 - Age of First Use: teens 1 - Amount (size/oz): 12 pack of beer 1 - Frequency: daily  1 - Duration: on-going since teens 1 - Last Use / Amount: 2 weeks ago  CIWA: CIWA-Ar BP: 106/67 mmHg Pulse Rate: 89 COWS:    Allergies: No Known Allergies  Home Medications:  (Not in a hospital admission)  OB/GYN Status:  No LMP for male  patient.  General Assessment Data Location of Assessment: [facility] Living Arrangements: Non-relatives/Friends Can pt return to current living arrangement?: No Admission Status: Voluntary Is patient capable of signing voluntary admission?: Yes Transfer from: Acute Hospital Referral Source: Self/Family/Friend  Education Status Is patient currently in school?: No Highest grade of school patient has completed: 5th  Risk to self Suicidal Ideation: Yes-Currently Present Suicidal Intent: Yes-Currently Present Is patient at risk for suicide?: Yes Suicidal Plan?: Yes-Currently  Present Specify Current Suicidal Plan:  (jump off a bridge or in traffic; cut wrist) Access to Means: Yes Specify Access to Suicidal Means:  (access to traffic, bridges, traffic, sharp objects) What has been your use of drugs/alcohol within the last 12 months?:  (alcohol) Previous Attempts/Gestures: Yes How many times?:  (1x) Other Self Harm Risks:  (cutting) Triggers for Past Attempts: None known Intentional Self Injurious Behavior: Cutting Comment - Self Injurious Behavior:  (cutting in past; not recent) Family Suicide History: No Recent stressful life event(s): Other (Comment) (conflict with housemates, friends, people at work) Persecutory voices/beliefs?: No Depression: No Depression Symptoms: Despondent Substance abuse history and/or treatment for substance abuse?: Yes Suicide prevention information given to non-admitted patients: Not applicable  Risk to Others Homicidal Ideation: Yes-Currently Present Thoughts of Harm to Others: Yes-Currently Present Comment - Thoughts of Harm to Others:  (wants to stabb or harm others) Current Homicidal Intent: No Current Homicidal Plan: Yes-Currently Present Describe Current Homicidal Plan:  (stabb of shoot his friends) Access to Homicidal Means: No Describe Access to Homicidal Means:  (sharp objects) Identified Victim:  (none reported) History of harm to others?: Yes Assessment of Violence: None Noted Violent Behavior Description:  (patient calm and cooperative) Does patient have access to weapons?: Yes (Comment) (pocket knife) Criminal Charges Pending?: No Does patient have a court date: No  Psychosis Hallucinations: None noted Delusions: None noted  Mental Status Report Appear/Hygiene: Disheveled Eye Contact: Fair Motor Activity: Freedom of movement Speech: Logical/coherent;Soft Level of Consciousness: Alert Mood: Depressed Affect: Appropriate to circumstance Anxiety Level: Minimal Thought Processes: Coherent Judgement:  Impaired Orientation: Person;Place;Time;Situation Obsessive Compulsive Thoughts/Behaviors: None  Cognitive Functioning Concentration: Decreased Memory: Remote Intact;Recent Intact IQ: Average Insight: Fair Impulse Control: Fair Appetite: Poor Weight Loss:  (n/a) Weight Gain:  (n/a) Sleep: Decreased Total Hours of Sleep:  (2 hours of sleep per night) Vegetative Symptoms: None  ADLScreening [facility]) Patient's cognitive ability adequate to safely complete daily activities?: Yes Patient able to express need for assistance with ADLs?: Yes Independently performs ADLs?: Yes (appropriate for developmental age)  Abuse/Neglect [facility]) Physical Abuse: Denies Verbal Abuse: Denies Sexual Abuse: Denies  Prior Inpatient Therapy Prior Inpatient Therapy: Yes Prior Therapy Dates: 2013 [facility]  Reason for Treatment: bipolar  Prior Outpatient Therapy Prior Outpatient Therapy: Yes Prior Therapy Dates: 2013 [facility]ilty[facility] Reason for Treatment: bipolar  ADL Screening (condition at time of admission) Patient's cognitive ability adequate to safely complete daily activities?: Yes Patient able to express need for assistance with ADLs?: Yes Independently performs ADLs?: Yes (appropriate for developmental age) Weakness of Legs: None Weakness of Arms/Hands: None  Home Assistive Devices/Equipment Home Assistive Devices/Equipment: None    Abuse/Neglect Assessment (Assessment to be complete while patient is alone) Physical Abuse: Denies Verbal Abuse: Denies Sexual Abuse: Denies Exploitation of patient/patient's resources: Denies Self-Neglect: Denies Values / Beliefs Cultural Requests During Hospitalization: None Spiritual Requests During Hospitalization: None   Advance Directives (For Healthcare) Advance Directive: Patient does not have advance directive Nutrition Screen-  [facility]  Adult/[facility] Patient's home diet: Regular  Additional Information 1:1 In Past 12 Months?: No CIRT Risk: No Elopement Risk: No Does patient have medical clearance?: Yes     Disposition:  Disposition Initial Assessment Completed for this Encounter: Yes Disposition of Patient: Inpatient treatment program Type of inpatient treatment program: Adult  On Site Evaluation by:   Reviewed with Physician:     Melynda Ripple [facility] 12/03/2012 8:04 PM

## 2012-12-03 NOTE — ED Notes (Signed)
Writer was told to hold patient by receiving nurse Kathlene November, RN.Marland Kitchen

## 2012-12-04 ENCOUNTER — Inpatient Hospital Stay (HOSPITAL_COMMUNITY)
Admission: EM | Admit: 2012-12-04 | Discharge: 2012-12-09 | DRG: 885 | Disposition: A | Discharge status: Home / Self Care | Payer: No Typology Code available for payment source | Source: Intra-hospital | Attending: Psychiatry | Admitting: Psychiatry

## 2012-12-04 ENCOUNTER — Encounter (HOSPITAL_COMMUNITY): Payer: Self-pay

## 2012-12-04 DIAGNOSIS — F323 Major depressive disorder, single episode, severe with psychotic features: Secondary | ICD-10-CM

## 2012-12-04 DIAGNOSIS — Z79899 Other long term (current) drug therapy: Secondary | ICD-10-CM

## 2012-12-04 DIAGNOSIS — I10 Essential (primary) hypertension: Secondary | ICD-10-CM | POA: Diagnosis present

## 2012-12-04 DIAGNOSIS — F3130 Bipolar disorder, current episode depressed, mild or moderate severity, unspecified: Principal | ICD-10-CM | POA: Diagnosis present

## 2012-12-04 DIAGNOSIS — R45851 Suicidal ideations: Secondary | ICD-10-CM

## 2012-12-04 DIAGNOSIS — J45909 Unspecified asthma, uncomplicated: Secondary | ICD-10-CM | POA: Diagnosis present

## 2012-12-04 DIAGNOSIS — F329 Major depressive disorder, single episode, unspecified: Secondary | ICD-10-CM

## 2012-12-04 DIAGNOSIS — F1010 Alcohol abuse, uncomplicated: Secondary | ICD-10-CM | POA: Diagnosis present

## 2012-12-04 MED ORDER — CHLORDIAZEPOXIDE HCL 25 MG PO CAPS
25.0000 mg | ORAL_CAPSULE | Freq: Four times a day (QID) | ORAL | Status: DC | PRN
  Administered 2012-12-05: 25 mg via ORAL
  Filled 2012-12-04: qty 1

## 2012-12-04 MED ORDER — PAROXETINE HCL 30 MG PO TABS
80.0000 mg | ORAL_TABLET | Freq: Every morning | ORAL | Status: DC
  Administered 2012-12-04: 80 mg via ORAL
  Filled 2012-12-04 (×2): qty 1

## 2012-12-04 MED ORDER — TRAZODONE HCL 100 MG PO TABS
200.0000 mg | ORAL_TABLET | Freq: Every day | ORAL | Status: DC
  Administered 2012-12-04 – 2012-12-08 (×4): 200 mg via ORAL
  Filled 2012-12-04: qty 28
  Filled 2012-12-04 (×7): qty 2

## 2012-12-04 MED ORDER — QUETIAPINE FUMARATE 200 MG PO TABS
200.0000 mg | ORAL_TABLET | Freq: Every day | ORAL | Status: DC
  Administered 2012-12-04 – 2012-12-08 (×4): 200 mg via ORAL
  Filled 2012-12-04 (×7): qty 1

## 2012-12-04 MED ORDER — QUETIAPINE FUMARATE 100 MG PO TABS
100.0000 mg | ORAL_TABLET | Freq: Every day | ORAL | Status: DC
  Filled 2012-12-04 (×2): qty 1

## 2012-12-04 MED ORDER — ALBUTEROL SULFATE HFA 108 (90 BASE) MCG/ACT IN AERS
2.0000 | INHALATION_SPRAY | Freq: Four times a day (QID) | RESPIRATORY_TRACT | Status: DC | PRN
  Administered 2012-12-06 – 2012-12-07 (×2): 2 via RESPIRATORY_TRACT
  Filled 2012-12-04: qty 6.7

## 2012-12-04 MED ORDER — ACETAMINOPHEN 325 MG PO TABS
650.0000 mg | ORAL_TABLET | Freq: Four times a day (QID) | ORAL | Status: DC | PRN
  Administered 2012-12-07 – 2012-12-09 (×2): 650 mg via ORAL

## 2012-12-04 MED ORDER — QUETIAPINE FUMARATE 50 MG PO TABS
50.0000 mg | ORAL_TABLET | Freq: Two times a day (BID) | ORAL | Status: DC
  Administered 2012-12-04 – 2012-12-05 (×3): 50 mg via ORAL
  Filled 2012-12-04 (×5): qty 1

## 2012-12-04 MED ORDER — MAGNESIUM HYDROXIDE 400 MG/5ML PO SUSP: 30.0000 mL | Freq: Every day | ORAL | Status: DC | PRN

## 2012-12-04 MED ORDER — IBUPROFEN 800 MG PO TABS
800.0000 mg | ORAL_TABLET | Freq: Three times a day (TID) | ORAL | Status: DC | PRN
  Administered 2012-12-05 – 2012-12-09 (×3): 800 mg via ORAL
  Filled 2012-12-04 (×3): qty 1

## 2012-12-04 MED ORDER — DULOXETINE HCL 30 MG PO CPEP
30.0000 mg | ORAL_CAPSULE | Freq: Every day | ORAL | Status: DC
  Administered 2012-12-04 – 2012-12-07 (×4): 30 mg via ORAL
  Filled 2012-12-04 (×7): qty 1

## 2012-12-04 MED ORDER — NICOTINE 14 MG/24HR TD PT24
14.0000 mg | MEDICATED_PATCH | Freq: Every day | TRANSDERMAL | Status: DC
  Administered 2012-12-04 – 2012-12-09 (×6): 14 mg via TRANSDERMAL
  Filled 2012-12-04 (×10): qty 1

## 2012-12-04 MED ORDER — PAROXETINE HCL 20 MG PO TABS
40.0000 mg | ORAL_TABLET | Freq: Every day | ORAL | Status: DC
  Administered 2012-12-05 – 2012-12-07 (×3): 40 mg via ORAL
  Filled 2012-12-04 (×5): qty 2

## 2012-12-04 NOTE — ED Notes (Signed)
No acute distress noted at discharge 

## 2012-12-04 NOTE — Progress Notes (Signed)
Adult Psychoeducational Group Note  Date:  12/04/2012 Time:  8:00PM Group Topic/Focus:  Wrap-Up Group:   The focus of this group is to help patients review their daily goal of treatment and discuss progress on daily workbooks.  Participation Level:  Active  Participation Quality:  Appropriate and Attentive  Affect:  Appropriate  Cognitive:  Alert and Appropriate  Insight: Appropriate  Engagement in Group:  Engaged  Modes of Intervention:  Discussion  Additional Comments:  Pt. Was attentive and appropriate during tonight's group discussion. Pt. Stated that his overall day was so/so. Pt stated that he is hoping to get medication management and get back in control on his meds.   [name] [name] D 12/04/2012, 9:12 PM

## 2012-12-04 NOTE — Tx Team (Signed)
Initial Interdisciplinary Treatment Plan  PATIENT STRENGTHS: (choose at least two) Ability for insight General fund of knowledge  PATIENT STRESSORS: Medication change or noncompliance Substance abuse   PROBLEM LIST: Problem List/Patient Goals Date to be addressed Date deferred Reason deferred Estimated date of resolution  Depression 12-04-12     SI 12-04-12     ETOH abuse 12-04-12     Medication Non-compliance 12-04-12                                    DISCHARGE CRITERIA:  Improved stabilization in mood, thinking, and/or behavior Medical problems require only outpatient monitoring  PRELIMINARY DISCHARGE PLAN: Attend aftercare/continuing care group Outpatient therapy  PATIENT/FAMIILY INVOLVEMENT: This treatment plan has been presented to and reviewed with the patient, [name] [name].  The patient and family have been given the opportunity to ask questions and make suggestions.  Jacques Navy A 12/04/2012, 2:17 AM

## 2012-12-04 NOTE — [facility] Counselor (Signed)
Adult Psychosocial Assessment Update Interdisciplinary Team  [facility] admissions/discharges:  Admissions Discharges  Date:  04/08/12 Date:  04/16/12  Date: Date:  Date: Date:  Date: Date:  Date: Date:   Changes since the last Psychosocial Assessment (including adherence to outpatient mental health and/or substance abuse treatment, situational issues contributing to decompensation and/or relapse). Patient reports becoming increasingly depressed and SI. He reports he has been off medications for several months.  He is endorsing A/H, depression and HI.  He reports he has been drinking several beers and shots of liquor daly.             Discharge Plan 1. Will you be returning to the same living situation after discharge?   Yes: No:      If no, what is your plan?    No. Patient reports he does not plan to return to the home he was living in prior to admission but has a place to stay.       2. Would you like a referral for services when you are discharged? Yes:     If yes, for what services?  No:       Yes.  Patient is interested in residential treatment and consented for referral to [facility].       Summary and Recommendations (to be completed by the evaluator) [name] [name] is a 31 years old Caucasian male admitted with Bipolar Disorder.   He will benefit from crisis stabilization, evaluation for medication, psycho-education groups for coping skills development, group therapy and case management for discharge planning.                        Signature:  [name], 12/04/2012 1:15 PM

## 2012-12-04 NOTE — Progress Notes (Signed)
Recreation Therapy Notes   Date: 07.02.2014 Time: 3:00pm Location: [address] Dayroom      Group Topic/Focus: Decision Making, Communication  Participation Level: Active  Participation Quality: Appropriate  Affect: Euthymic  Cognitive: Appropriate   Additional Comments: Activity: Life Boat ; Explanation: Patients were given the following scenario: We have chartered a Radio producer for the afternoon. Halfway through our trip the boat springs a leak and we begin to sink. We are able to save ourselves, plus 8 of the following people: Darliss Cheney, Janyth Pupa, [address], Investment banker, operational, Psychologist, occupational, Personnel officer, Curator, Runner, broadcasting/film/video, male physician, nurse, priest, rabbi, ex-convict, ex-marine, Anthonette Legato  Patient actively participated in group activity. Patient gave reasoning and justification to back the choices he made. Patient debated appropriately with peers regarding individuals being saved off of list. Patient listened in wrap up discussion about the effect of decision making on support system.   Marykay Lex Flem Enderle, LRT/CTRS  Wave Calzada L 12/04/2012 4:07 PM

## 2012-12-04 NOTE — Progress Notes (Signed)
Adult Psychoeducational Group Note  Date:  12/04/2012 Time:  1:31 PM  Group Topic/Focus:  Personal Choices and Values:   The focus of this group is to help patients assess and explore the importance of values in their lives, how their values affect their decisions, how they express their values and what opposes their expression.  Participation Level:  Active  Participation Quality:  Attentive  Affect:  Appropriate  Cognitive:  Alert and Appropriate  Insight: Good  Engagement in Group:  Engaged  Modes of Intervention:  Discussion and Exploration  Additional Comments:  Samuel mentioned that he wants to focus on being compliant with his medication and take control of himself. He wants to practice not doing what people tell him to do.  [place] Shi K 12/04/2012, 1:31 PM

## 2012-12-04 NOTE — Progress Notes (Signed)
Pt has frequent complaints of back pain and states that he would like to be put back on Neurontin which helped him in the past.

## 2012-12-04 NOTE — [facility] Group Notes (Signed)
[facility] LCSW Aftercare Discharge Planning Group Note   12/04/2012 3:12 PM  Participation Quality:  Appropriate  Mood/Affect:  Appropriate and Depressed  Depression Rating:  8  Anxiety Rating:  8  Thoughts of Suicide:  Yes  Will you contract for safety?   Yes  Current AVH:  Yes  Plan for Discharge/Comments:  Patient reports admitting to hospital due to SI/HI.  He is also endorsing AH.  Patient stated he is drinking six beers and several shots of alcohol daily and is requesting residential treatment.  He reports he will be exploring other housing options and has the means to find a place to live.  Transportation Means: Patient uses public transportation.   Supports:   Patient has limited support system.   [name] [name], [name]

## 2012-12-04 NOTE — [facility] Suicide Risk Assessment (Signed)
Suicide Risk Assessment  Admission Assessment     Nursing information obtained from:  Patient Demographic factors:  Male;Caucasian;Low socioeconomic status;Unemployed Current Mental Status:  Suicide plan;Self-harm thoughts;Intention to act on plan to harm others Loss Factors:  Financial problems / change in socioeconomic status Historical Factors:  Prior suicide attempts Risk Reduction Factors:  NA  CLINICAL FACTORS:   Severe Anxiety and/or Agitation Bipolar Disorder:   Depressive phase Depression:   Aggression Anhedonia Comorbid alcohol abuse/dependence Hopelessness Impulsivity Insomnia Recent sense of peace/wellbeing Severe Alcohol/Substance Abuse/Dependencies Personality Disorders:   Cluster B Comorbid alcohol abuse/dependence More than one psychiatric diagnosis Unstable or Poor Therapeutic Relationship Previous Psychiatric Diagnoses and Treatments Medical Diagnoses and Treatments/Surgeries  COGNITIVE FEATURES THAT CONTRIBUTE TO RISK:  Closed-mindedness Polarized thinking    SUICIDE RISK:   Moderate:  Frequent suicidal ideation with limited intensity, and duration, some specificity in terms of plans, no associated intent, good self-control, limited dysphoria/symptomatology, some risk factors present, and identifiable protective factors, including available and accessible social support.  PLAN OF CARE: Admitted voluntarily and emergently from [facility] to [facility] for depression and suicidal ideation with plan of jumping from bridge and states his medication is not helping him.   I certify that inpatient services furnished can reasonably be expected to improve the patient's condition.  Tesneem Dufrane,[name]. 12/04/2012, 11:34 AM

## 2012-12-04 NOTE — Tx Team (Signed)
Interdisciplinary Treatment Plan Update   Date Reviewed:  12/04/2012  Time Reviewed:  9:52 AM  Progress in Treatment:   Attending groups: Yes Participating in groups: Yes Taking medication as prescribed: Yes  Tolerating medication: Yes Family/Significant other contact made: No, but will ask patient for consent for collateral contact Patient understands diagnosis: Yes  Discussing patient identified problems/goals with staff: Yes Medical problems stabilized or resolved: Yes Denies suicidal/homicidal ideation: Yes Patient has not harmed self or others: Yes  For review of initial/current patient goals, please see plan of care.  Estimated Length of Stay:  3-5 days  Reasons for Continued Hospitalization:  Anxiety Depression Medication stabilization Suicidal ideation Homicidal ideation  New Problems/Goals identified:    Discharge Plan or Barriers:   Home with outpatient follow up  Additional Comments:  Patient is interested in residential treatment  Attendees:  Patient:  12/04/2012 9:52 AM   Signature: Mervyn Gay, MD 12/04/2012 9:52 AM  Signature: 12/04/2012 9:52 AM  Signature: Harold Barban, RN 12/04/2012 9:52 AM  Signature:Beverly Terrilee Croak, RN 12/04/2012 9:52 AM  Signature:  Neill Loft RN 12/04/2012 9:52 AM  Signature:  Juline Patch, LCSW 12/04/2012 9:52 AM  Signature:  Reyes Ivan, LCSW 12/04/2012 9:52 AM  Signature:  Sharin Grave Coordinator 12/04/2012 9:52 AM  Signature: Fransisca Kaufmann, [facility] 12/04/2012 9:52 AM  Signature:    Signature:    Signature:      Scribe for Treatment Team:   Juline Patch,  12/04/2012 9:52 AM

## 2012-12-04 NOTE — [facility] Group Notes (Signed)
[facility] LCSW Group Therapy  Emotional Regulation 1:15 - 2: 30 PM        12/04/2012  3:15 PM   Type of Therapy:  Group Therapy  Participation Level:  Appropriate  Participation Quality:  Appropriate  Affect:  Appropriate  Cognitive:  Attentive Appropriate  Insight:  Engaged  Engagement in Therapy:  Engaged  Modes of Intervention:  Discussion Exploration Problem-Solving Supportive  Summary of Progress/Problems:  Group topic was emotional regulations.  Patient participated in the discussion and was able to identify an emotion, anger, that needed to regulated.  Patient was able to identify approprite coping skills of staying on medications and learning to walk awa from potential problems.[name] 12/04/2012 3:15 PM

## 2012-12-04 NOTE — Progress Notes (Addendum)
Patient ID: [name] [name], male   DOB: August 22, 1980, 32 y.o.   MRN: [id number]  Admission Note:  D:32 yr male who presents [facility] in no acute distress for the treatment of SI and Depression. Pt appears flat and depressed. Pt was calm and cooperative with admission process. Pt presents with passive SI and contracts for safety upon admission.Pt endorses AH that command pt to hurt himself. Pt denies VH .Pt has Past medical Hx of Asthma, Depression, Bi-polar, HTN, irregular Heart rate. Pt presents with SI and a plan to jump into traffic. Pt states increased triggers of friends , coworkers and roommates picking on him. Pt stopped taking medications stating they do not work. Pt has Hx of cutting in past, Pt mother past away 66yrs ago, only support.   A:Skin was assessed and found to be clear of any abnormal marks apart from 4 tattoos on R-arm, Eczema on L-forearm.  POC and unit policies explained and understanding verbalized. Consents obtained. Food and fluids offered, and  accepted.   R:Pt had no additional questions or concerns.

## 2012-12-04 NOTE — Progress Notes (Signed)
D:Pt rates his depression as a 9 on 1-10 scale with 10 being the most depressed. He expresses passive si and hi thoughts towards his friend and roommate. Pt is attending groups and interacting on the unit. Pt is assertive and takes medications as ordered. Pt c/o back pain that he rated as an 8. He was given an ice pack and reported a decrease in pain. A:Supported pt to discuss feelings. Offered encouragement and 15 minute checks. Gave ice pack for pain and wrote an order for a nicotine patch as requested. R:Pt contracts with staff for safety. Safety maintained on the unit.
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
## 2012-12-04 NOTE — Progress Notes (Signed)
Patient ID: JARIS KOHLES, male   DOB: 1981-03-09, 32 y.o.   MRN: [id number]  D: Pt was observed interacting with his peers, laughing and talking. When asked about his day pt stated, "I'm trying to get something for constant pain for my back". Pt stated he was prescribed neurontin at "the last place he was".  Stated that med was adm by [facility]. Informed pt that neurontin wasn't on his home med list. However, instructed pt that the writer would contact the extender. Pt states he plans to discharge to [facility] for 30 day treatment.   A: Writer given order for tylenol and ibuprofen. Support and encouragement was offered. 15 min checks continued for safety.  R: Pt remains safe.

## 2012-12-05 MED ORDER — GABAPENTIN 100 MG PO CAPS
100.0000 mg | ORAL_CAPSULE | Freq: Three times a day (TID) | ORAL | Status: DC
  Administered 2012-12-05 – 2012-12-07 (×7): 100 mg via ORAL
  Filled 2012-12-05 (×13): qty 1

## 2012-12-05 MED ORDER — QUETIAPINE FUMARATE 100 MG PO TABS
100.0000 mg | ORAL_TABLET | ORAL | Status: DC
  Administered 2012-12-05 – 2012-12-09 (×8): 100 mg via ORAL
  Filled 2012-12-05 (×3): qty 1
  Filled 2012-12-05: qty 56
  Filled 2012-12-05 (×3): qty 1
  Filled 2012-12-05: qty 56
  Filled 2012-12-05 (×5): qty 1

## 2012-12-05 NOTE — [facility] Group Notes (Signed)
[facility] LCSW Group Therapy  [facility] 1:15 - 2:30 PM  12/05/2012. 12:06 PM   Type of Therapy:  Group Therapy  Participation Level:  Minimal  Participation Quality:  Attentive  Affect:  Appropriate  Cognitive:  Appropriate  Insight:  Developing/Improving   Engagement in Therapy:  Developing/Improving  Modes of Intervention:  Discussion, Education, Exploration, Problem-Solving, Rapport Building, Support   Summary of Progress/Problems:  Patient listened attentively to speaker from [employer] but made no comments on the presentation.   [name] 12/05/2012  12:06 PM

## 2012-12-05 NOTE — [facility] Group Notes (Signed)
[facility] LCSW Aftercare Discharge Planning Group Note   12/05/2012 12:05 PM  Participation Quality:  Did not attend group.  Natsuko Kelsay, [name]

## 2012-12-05 NOTE — Progress Notes (Signed)
[facility] MD Progress Note  12/05/2012 11:22 AM [name] [name]  MRN:  [id number] Subjective:  Patient is observed sitting in the hallway appearing very anxious clutching a stress ball in his right hand. He states "I started feeling very agitated this morning. The voices in my head are not decreasing. I just want them to go away. I can't take this." He reports the voices tell him to hurt himself but he is able to contract for his safety. Patient rates his depression at eight and his anxiety at seven. He continues to have suicidal thoughts. He received a prn dose of librium from nursing staff.  Diagnosis:   Axis I: Bipolar, Depressed Axis II: Deferred Axis III:  Past Medical History  Diagnosis Date  . Mental disorder   . Hypertension   . Depression   . Bipolar 1 disorder   . Irregular heart rate   . Asthma    Axis IV: economic problems, other psychosocial or environmental problems, problems related to social environment and problems with primary support group Axis V: 41-50 serious symptoms  ADL's:  Impaired  Sleep: Fair  Appetite:  Fair  Suicidal Ideation:  Plan:  Jump from bridge if outside the hospital Intent:  Present but safe in the hospital Means:  None presently Homicidal Ideation:  Denies AEB (as evidenced by):  Psychiatric Specialty Exam: Review of Systems  Constitutional: Positive for diaphoresis.  Eyes: Negative.   Respiratory: Negative.   Cardiovascular: Negative.   Gastrointestinal: Negative.   Genitourinary: Negative.   Musculoskeletal: Positive for back pain.  Skin: Negative.   Neurological: Positive for headaches.  Endo/Heme/Allergies: Negative.   Psychiatric/Behavioral: Positive for depression, suicidal ideas, hallucinations and substance abuse. Negative for memory loss. The patient is nervous/anxious. The patient does not have insomnia.     Blood pressure 106/69, pulse 96, temperature 97.4 F (36.3 C), temperature source Oral, resp. rate 20, height 6'  (1.829 m), weight 124.739 kg (275 lb).Body mass index is 37.29 kg/(m^2).  General Appearance: Disheveled and Has body odor  Eye Contact::  Fair  Speech:  Clear and Coherent  Volume:  Decreased  Mood:  Angry, Anxious, Dysphoric and Hopeless  Affect:  Constricted and Tearful  Thought Process:  Goal Directed  Orientation:  Full (Time, Place, and Person)  Thought Content:  WDL  Suicidal Thoughts:  Yes.  with intent/plan  Homicidal Thoughts:  No  Memory:  Immediate;   Good Recent;   Good Remote;   Good  Judgement:  Impaired  Insight:  Shallow  Psychomotor Activity:  Normal  Concentration:  Poor  Recall:  Fair  Akathisia:  No  Handed:  Left  AIMS (if indicated):     Assets:  Communication Skills Desire for Improvement Physical Health Resilience Social Support  Sleep:  Number of Hours: 6.75   Current Medications: Current Facility-Administered Medications  Medication Dose Route Frequency Provider Last Rate Last Dose  . acetaminophen (TYLENOL) tablet 650 mg  650 mg Oral Q6H PRN Kerry Hough, PA-C      . albuterol (PROVENTIL HFA;VENTOLIN HFA) 108 (90 BASE) MCG/ACT inhaler 2 puff  2 puff Inhalation Q6H PRN Verne Spurr, PA-C      . chlordiazePOXIDE (LIBRIUM) capsule 25 mg  25 mg Oral Q6H PRN Fransisca Kaufmann, NP   25 mg at 12/05/12 1112  . DULoxetine (CYMBALTA) DR capsule 30 mg  30 mg Oral Daily Nehemiah Settle, MD   30 mg at 12/05/12 0742  . gabapentin (NEURONTIN) capsule 100 mg  100 mg  Oral TID Fransisca Kaufmann, NP      . ibuprofen (ADVIL,MOTRIN) tablet 800 mg  800 mg Oral Q8H PRN Kerry Hough, PA-C   800 mg at 12/05/12 9629  . magnesium hydroxide (MILK OF MAGNESIA) suspension 30 mL  30 mL Oral Daily PRN Verne Spurr, PA-C      . nicotine (NICODERM CQ - dosed in mg/24 hours) patch 14 mg  14 mg Transdermal Daily Nehemiah Settle, MD   14 mg at 12/05/12 0744  . PARoxetine (PAXIL) tablet 40 mg  40 mg Oral Daily Nehemiah Settle, MD   40 mg at 12/05/12 0742  .  QUEtiapine (SEROQUEL) tablet 200 mg  200 mg Oral QHS Nehemiah Settle, MD   200 mg at 12/04/12 2134  . QUEtiapine (SEROQUEL) tablet 50 mg  50 mg Oral BID Verne Spurr, PA-C   50 mg at 12/05/12 0944  . traZODone (DESYREL) tablet 200 mg  200 mg Oral QHS Verne Spurr, PA-C   200 mg at 12/04/12 2134    Lab Results: No results found for this or any previous visit (from the past 48 hour(s)).  Physical Findings: AIMS: Facial and Oral Movements Muscles of Facial Expression: None, normal Lips and Perioral Area: None, normal Jaw: None, normal Tongue: None, normal,Extremity Movements Upper (arms, wrists, hands, fingers): None, normal Lower (legs, knees, ankles, toes): None, normal, Trunk Movements Neck, shoulders, hips: None, normal, Overall Severity Severity of abnormal movements (highest score from questions above): None, normal Incapacitation due to abnormal movements: None, normal Patient's awareness of abnormal movements (rate only patient's report): No Awareness, Dental Status Current problems with teeth and/or dentures?: No Does patient usually wear dentures?: No  CIWA:  CIWA-Ar Total: 0 COWS:  COWS Total Score: 0  Treatment Plan Summary: Daily contact with patient to assess and evaluate symptoms and progress in treatment Medication management  Plan: Continue crisis management and stabilization.  Medication management: Reviewed with patient who stated no untoward effects. Start Neurontin 100 mg TID for pain and anxiety. Increase Seroquel to 100 mg BID with continued 200 mg at bedtime.  Encouraged patient to attend groups and participate in group counseling sessions and activities.  Discharge plan in progress.  Address health issues: Vitals reviewed and stable.  Continue current treatment plan.   Medical Decision Making Problem Points:  Established problem, stable/improving (1) and Review of psycho-social stressors (1) Data Points:  Review of medication regiment & side  effects (2)  I certify that inpatient services furnished can reasonably be expected to improve the patient's condition.   Railey Glad NP-C 12/05/2012, 11:22 AM

## 2012-12-05 NOTE — Progress Notes (Signed)
Adult Psychoeducational Group Note  Date:  12/05/2012 Time:  1:32 PM  Group Topic/Focus:  Building Self Esteem:   The Focus of this group is helping patients become aware of the effects of self-esteem on their lives, the things they and others do that enhance or undermine their self-esteem, seeing the relationship between their level of self-esteem and the choices they make and learning ways to enhance self-esteem.  Participation Level:  Minimal  Participation Quality:  Resistant  Affect:  Depressed, Flat and Resistant  Cognitive:  Alert  Insight: Limited  Engagement in Group:  Poor and Resistant  Modes of Intervention:  Discussion, Socialization and Support  Additional Comments:  The purpose of this group is to identify positive self-esteem and triggers for negative self-esteem. Pt attended group, however he only spoke when spoken to without much input. When asked on positive thing that helps build his esteem, he stated that he did not know. Other peers gave input and said that he is very funny. Pt agreed and stated that he is funny.  [name] [name] 12/05/2012, 1:32 PM

## 2012-12-05 NOTE — Progress Notes (Signed)
D:Pt has a flat/sad affect. He reports that he is having passive SI today along with passive HI and lower back pain of an 8 on 1-10 scale with 10 being the worst pain. Pt is in the hall interacting and attending groups.  A:Supported pt to discuss feeling. Gave PRN medication for back pain. Offered encouragement and 15 minute checks. R:Pt contracts with staff for safety. Safety maintained on the unit.

## 2012-12-06 DIAGNOSIS — F3130 Bipolar disorder, current episode depressed, mild or moderate severity, unspecified: Principal | ICD-10-CM

## 2012-12-06 MED ORDER — ONDANSETRON 4 MG PO TBDP: 4.0000 mg | ORAL_TABLET | Freq: Four times a day (QID) | ORAL | Status: AC | PRN

## 2012-12-06 MED ORDER — CHLORDIAZEPOXIDE HCL 25 MG PO CAPS
25.0000 mg | ORAL_CAPSULE | Freq: Three times a day (TID) | ORAL | Status: AC
  Administered 2012-12-07 (×3): 25 mg via ORAL
  Filled 2012-12-06 (×2): qty 1

## 2012-12-06 MED ORDER — HYDROXYZINE HCL 25 MG PO TABS: 25.0000 mg | ORAL_TABLET | Freq: Four times a day (QID) | ORAL | Status: AC | PRN

## 2012-12-06 MED ORDER — CHLORDIAZEPOXIDE HCL 25 MG PO CAPS
25.0000 mg | ORAL_CAPSULE | Freq: Every day | ORAL | Status: AC
  Administered 2012-12-09: 25 mg via ORAL
  Filled 2012-12-06: qty 1

## 2012-12-06 MED ORDER — ADULT MULTIVITAMIN W/MINERALS CH
1.0000 | ORAL_TABLET | Freq: Every day | ORAL | Status: DC
  Administered 2012-12-06 – 2012-12-09 (×4): 1 via ORAL
  Filled 2012-12-06 (×6): qty 1

## 2012-12-06 MED ORDER — VITAMIN B-1 100 MG PO TABS
100.0000 mg | ORAL_TABLET | Freq: Every day | ORAL | Status: DC
  Administered 2012-12-07 – 2012-12-09 (×3): 100 mg via ORAL
  Filled 2012-12-06 (×5): qty 1

## 2012-12-06 MED ORDER — CHLORDIAZEPOXIDE HCL 25 MG PO CAPS
25.0000 mg | ORAL_CAPSULE | Freq: Four times a day (QID) | ORAL | Status: AC
  Administered 2012-12-06 (×3): 25 mg via ORAL
  Filled 2012-12-06 (×3): qty 1

## 2012-12-06 MED ORDER — CHLORDIAZEPOXIDE HCL 25 MG PO CAPS
25.0000 mg | ORAL_CAPSULE | Freq: Four times a day (QID) | ORAL | Status: AC | PRN
  Filled 2012-12-06: qty 1

## 2012-12-06 MED ORDER — CHLORDIAZEPOXIDE HCL 25 MG PO CAPS
25.0000 mg | ORAL_CAPSULE | ORAL | Status: AC
  Administered 2012-12-08 (×2): 25 mg via ORAL
  Filled 2012-12-06 (×2): qty 1

## 2012-12-06 MED ORDER — LOPERAMIDE HCL 2 MG PO CAPS: 2.0000 mg | ORAL_CAPSULE | ORAL | Status: AC | PRN

## 2012-12-06 MED ORDER — CHLORDIAZEPOXIDE HCL 25 MG PO CAPS: 25.0000 mg | ORAL_CAPSULE | Freq: Once | ORAL | Status: DC

## 2012-12-06 NOTE — Progress Notes (Signed)
D: Patient appropriate and cooperative with staff and peers. Patient's affect/mood is depressed. Patient complained of shortness of breath/wheezing. He reported on the self inventory sheet that he slept well, appetite/ability to pay attention are improving and energy level is low. Patient rated depression and feelings of hopelessness "8".   A: Support and encouragement provided to patient. Administered scheduled medications per ordering MD. PRN Albuterol given for shortness of breath/wheezing. Monitor Q15 minute checks for safety.  R: Patient receptive. Passive SI/AH, but contracts for safety. Patient verbalized that the voices tell him to do bad things to himself. Denies HI and visual hallucinations.

## 2012-12-06 NOTE — Progress Notes (Signed)
Patient ID: [name] [name], male   DOB: [birth year], 32 y.o.   MRN: [id number]  D: Pt +ve passive SI and AH, but contracts for safety. Pt denies HI/AVH. Pt is pleasant and cooperative. Pt states he was having thoughts of SI earlier, but not anymore. Pt said he was hearing voices earlier telling to hurt himself, but not now. Pt also stated he had a panic attack earlier, but feels better now.    A: Pt was offered support and encouragement. Pt was given scheduled medications. Pt was encourage to attend groups. Q 15 minute checks were done for safety.   R:Pt attends groups and interacts well with peers and staff. Pt is taking medication. Pt has no complaints at this time.Pt receptive to treatment and safety maintained on unit.

## 2012-12-06 NOTE — Progress Notes (Signed)
Patient ID: [name] [name], male   DOB: 02-Apr-1981, 32 y.o.   MRN: [id number]  D: Pt endorses passive SI and AVH, but contracts for safety. Pt denies /HI. Pt is pleasant and cooperative. Pt states " had a bad day, but it got better, things going on at home and I can't help out"  A: Pt was offered support and encouragement. Pt was given scheduled medications. Pt was encourage to attend groups. Q 15 minute checks were done for safety.   R:Pt  interacts with peers and staff. Pt is taking medication. Pt has no complaints at this time.Pt receptive to treatment and safety maintained on unit.

## 2012-12-06 NOTE — Progress Notes (Signed)
Psychoeducational Group Note  Date:  12/06/2012 Time:  2000  Group Topic/Focus:  Wrap-Up Group:   The focus of this group is to help patients review their daily goal of treatment and discuss progress on daily workbooks.  Participation Level: Did Not Attend  Participation Quality:  Not Applicable  Affect:  Not Applicable  Cognitive:  Not Applicable  Insight:  Not Applicable  Engagement in Group: Not Applicable  Additional Comments:  The patient did not attend group this evening.   Hazle Coca S 12/06/2012, 11:56 PM

## 2012-12-06 NOTE — Progress Notes (Signed)
Patient ID: [name] [name], male   DOB: 18-Sep-1980, 32 y.o.   MRN: [id number] [facility] MD Progress Note  12/06/2012 12:14 PM [name] [name]  MRN:  [id number] Subjective:   Patient reports feeling chills, seeing black dots, tremors, and sweats. He feels that he is experiencing alcohol withdrawal. Patient reports having passive SI to cut but contracts for his safety. He reports that he is still hearing voices. Rates his depression at eight and feeling anxious at eight. Patient appears very anxious today.   Diagnosis:   Axis I: Bipolar, Depressed Axis II: Deferred Axis III:  Past Medical History  Diagnosis Date  . Mental disorder   . Hypertension   . Depression   . Bipolar 1 disorder   . Irregular heart rate   . Asthma    Axis IV: economic problems, other psychosocial or environmental problems, problems related to social environment and problems with primary support group Axis V: 41-50 serious symptoms  ADL's:  Impaired  Sleep: Good  Appetite:  Fair  Suicidal Ideation:  Plan:  Jump from bridge if outside the hospital Intent:  Present but safe in the hospital Means:  None presently Homicidal Ideation:  Denies AEB (as evidenced by):  Psychiatric Specialty Exam: Review of Systems  Constitutional: Positive for diaphoresis.  Eyes: Negative.   Respiratory: Negative.   Cardiovascular: Negative.   Gastrointestinal: Negative.   Genitourinary: Negative.   Musculoskeletal: Positive for back pain.  Skin: Negative.   Neurological: Positive for headaches.  Endo/Heme/Allergies: Negative.   Psychiatric/Behavioral: Positive for depression, suicidal ideas, hallucinations and substance abuse. Negative for memory loss. The patient is nervous/anxious. The patient does not have insomnia.     Blood pressure 110/69, pulse 94, temperature 98.7 F (37.1 C), temperature source Oral, resp. rate 16, height 6' (1.829 m), weight 124.739 kg (275 lb).Body mass index is 37.29 kg/(m^2).  General  Appearance: Disheveled and Has body odor  Eye Contact::  Fair  Speech:  Clear and Coherent  Volume:  Decreased  Mood:  Angry, Anxious, Dysphoric and Hopeless  Affect:  Constricted and Tearful  Thought Process:  Goal Directed  Orientation:  Full (Time, Place, and Person)  Thought Content:  WDL  Suicidal Thoughts:  Yes.  with intent/plan  Homicidal Thoughts:  No  Memory:  Immediate;   Good Recent;   Good Remote;   Good  Judgement:  Impaired  Insight:  Shallow  Psychomotor Activity:  Normal  Concentration:  Poor  Recall:  Fair  Akathisia:  No  Handed:  Left  AIMS (if indicated):     Assets:  Communication Skills Desire for Improvement Physical Health Resilience Social Support  Sleep:  Number of Hours: 6.75   Current Medications: Current Facility-Administered Medications  Medication Dose Route Frequency Provider Last Rate Last Dose  . acetaminophen (TYLENOL) tablet 650 mg  650 mg Oral Q6H PRN Kerry Hough, PA-C      . albuterol (PROVENTIL HFA;VENTOLIN HFA) 108 (90 BASE) MCG/ACT inhaler 2 puff  2 puff Inhalation Q6H PRN Verne Spurr, PA-C   2 puff at 12/06/12 [phone]  . chlordiazePOXIDE (LIBRIUM) capsule 25 mg  25 mg Oral Q6H PRN Fransisca Kaufmann, NP      . chlordiazePOXIDE (LIBRIUM) capsule 25 mg  25 mg Oral QID Fransisca Kaufmann, NP   25 mg at 12/06/12 1207   Followed by  . [START ON 12/07/2012] chlordiazePOXIDE (LIBRIUM) capsule 25 mg  25 mg Oral TID Fransisca Kaufmann, NP       Followed by  . [  START ON 12/08/2012] chlordiazePOXIDE (LIBRIUM) capsule 25 mg  25 mg Oral BH-qamhs Fransisca Kaufmann, NP       Followed by  . [START ON 12/09/2012] chlordiazePOXIDE (LIBRIUM) capsule 25 mg  25 mg Oral Daily Fransisca Kaufmann, NP      . DULoxetine (CYMBALTA) DR capsule 30 mg  30 mg Oral Daily Nehemiah Settle, MD   30 mg at 12/06/12 0748  . gabapentin (NEURONTIN) capsule 100 mg  100 mg Oral TID Fransisca Kaufmann, NP   100 mg at 12/06/12 1207  . hydrOXYzine (ATARAX/VISTARIL) tablet 25 mg  25 mg Oral Q6H PRN Fransisca Kaufmann, NP      . ibuprofen (ADVIL,MOTRIN) tablet 800 mg  800 mg Oral Q8H PRN Kerry Hough, PA-C   800 mg at 12/05/12 1610  . loperamide (IMODIUM) capsule 2-4 mg  2-4 mg Oral PRN Fransisca Kaufmann, NP      . magnesium hydroxide (MILK OF MAGNESIA) suspension 30 mL  30 mL Oral Daily PRN Verne Spurr, PA-C      . multivitamin with minerals tablet 1 tablet  1 tablet Oral Daily Fransisca Kaufmann, NP   1 tablet at 12/06/12 1208  . nicotine (NICODERM CQ - dosed in mg/24 hours) patch 14 mg  14 mg Transdermal Daily Nehemiah Settle, MD   14 mg at 12/06/12 0747  . ondansetron (ZOFRAN-ODT) disintegrating tablet 4 mg  4 mg Oral Q6H PRN Fransisca Kaufmann, NP      . PARoxetine (PAXIL) tablet 40 mg  40 mg Oral Daily Nehemiah Settle, MD   40 mg at 12/06/12 0748  . QUEtiapine (SEROQUEL) tablet 100 mg  100 mg Oral BH-[id number] Fransisca Kaufmann, NP   100 mg at 12/06/12 0747  . QUEtiapine (SEROQUEL) tablet 200 mg  200 mg Oral QHS Nehemiah Settle, MD   200 mg at 12/05/12 2218  . [START ON 12/07/2012] thiamine (VITAMIN B-1) tablet 100 mg  100 mg Oral Daily Fransisca Kaufmann, NP      . traZODone (DESYREL) tablet 200 mg  200 mg Oral QHS Verne Spurr, PA-C   200 mg at 12/05/12 2217    Lab Results: No results found for this or any previous visit (from the past 48 hour(s)).  Physical Findings: AIMS: Facial and Oral Movements Muscles of Facial Expression: None, normal Lips and Perioral Area: None, normal Jaw: None, normal Tongue: None, normal,Extremity Movements Upper (arms, wrists, hands, fingers): None, normal Lower (legs, knees, ankles, toes): None, normal, Trunk Movements Neck, shoulders, hips: None, normal, Overall Severity Severity of abnormal movements (highest score from questions above): None, normal Incapacitation due to abnormal movements: None, normal Patient's awareness of abnormal movements (rate only patient's report): No Awareness, Dental Status Current problems with teeth and/or dentures?: No Does  patient usually wear dentures?: No  CIWA:  CIWA-Ar Total: 0 COWS:  COWS Total Score: 0  Treatment Plan Summary: Daily contact with patient to assess and evaluate symptoms and progress in treatment Medication management  Plan: Continue crisis management and stabilization.  Medication management: Reviewed with patient who stated no untoward effects. Continue scheduled Neurontin and Seroquel. Start Librium protocol for alcohol withdrawal.  Encouraged patient to attend groups and participate in group counseling sessions and activities.  Discharge plan in progress.  Address health issues: Vitals reviewed and stable.  Continue current treatment plan.   Medical Decision Making Problem Points:  Established problem, stable/improving (1) and Review of psycho-social stressors (1) Data Points:  Review of medication regiment & side effects (2)  I certify that inpatient services furnished can reasonably be expected to improve the patient's condition.   Ladanian Kelter NP-C 12/06/2012, 12:14 PM

## 2012-12-07 MED ORDER — DULOXETINE HCL 60 MG PO CPEP
60.0000 mg | ORAL_CAPSULE | Freq: Every day | ORAL | Status: DC
  Administered 2012-12-08 – 2012-12-09 (×2): 60 mg via ORAL
  Filled 2012-12-07 (×3): qty 1
  Filled 2012-12-07: qty 14

## 2012-12-07 MED ORDER — GABAPENTIN 300 MG PO CAPS
300.0000 mg | ORAL_CAPSULE | Freq: Three times a day (TID) | ORAL | Status: DC
  Administered 2012-12-07 – 2012-12-09 (×6): 300 mg via ORAL
  Filled 2012-12-07: qty 42
  Filled 2012-12-07 (×2): qty 1
  Filled 2012-12-07 (×2): qty 42
  Filled 2012-12-07 (×6): qty 1

## 2012-12-07 MED ORDER — PAROXETINE HCL 20 MG PO TABS
20.0000 mg | ORAL_TABLET | Freq: Every day | ORAL | Status: DC
  Administered 2012-12-08 – 2012-12-09 (×2): 20 mg via ORAL
  Filled 2012-12-07: qty 1
  Filled 2012-12-07: qty 14
  Filled 2012-12-07 (×2): qty 1

## 2012-12-07 NOTE — Progress Notes (Signed)
[name] [name] is doing well today. HE is attending his groups and is engaged in his POC. HE is active in the groups discussions, he is actively trying to develop and institute healtheir coping skills.   A He completed his AM self inventory  And on it he wrote he cont to have SI within the past 24 hrs, but contracts with this nurse to stay safe. He rated his depression and hopelessness "7/7" and he stated he is " getting better" every day.   R Safety is in place and POC moves forward.

## 2012-12-07 NOTE — Progress Notes (Signed)
Psychoeducational Group Note  Date:  12/07/2012 Time: 0930  Group Topic/Focus:  Identifying  Goals :  The group is focused on helping patients identify goals they want to achieve and then identify strategies needed to achieve these goals.  Participation Level:  active Participation Quality: good Affect: flat Cognitive:  good  Insight:  good  Engagement in Group: engaged  Additional Comments:   PD RN [facility]

## 2012-12-07 NOTE — Progress Notes (Signed)
Psychoeducational Group Note  Date: 12/07/2012 Time:  1015  Group Topic/Focus:  Identifying Needs:   The focus of this group is to help patients identify their personal needs that have been historically problematic and identify healthy behaviors to address their needs.  Participation Level:  Active  Participation Quality:  Appropriate  Affect:  Appropriate  Cognitive:  Oriented  Insight: Improving  Engagement in Group:  Engaged  Additional Comments:  Attended the group and was involved throughout the entire group  Shakeem Stern A 

## 2012-12-07 NOTE — [facility] Group Notes (Signed)
[facility] Group Notes: (Clinical Social Work)   12/07/2012      Type of Therapy:  Group Therapy   Participation Level:  Did Not Attend    Ambrose Mantle, LCSW 12/07/2012, 4:43 PM

## 2012-12-07 NOTE — Progress Notes (Signed)
[facility] MD Progress Note  12/07/2012 12:05 PM [name] [name]  MRN:  [id number] Subjective:  [name] [name] was anxiously awaiting to talk to the doctor today. He wants to be certain that his anxiety will be addressed with medication after he completes his detox taper. His main concern is that he wants to be discharged on Monday so that he can visit with his family before going to [facility] on Wednesday. He continues to endorse some auditory hallucinations of voices telling him to harm his self or others. He endorses suicidal ideation to cut his wrists, and homicidal ideation toward people outside of this facility. He reports that these people put him in a bad position, and he would cut them or be them. He reports that he is sleeping well, and his appetite is good. He rates his depression and anxiety both as an 8 on a scale of 1-10 where 10 is the worst.  Diagnosis:   Axis I: Alcohol Abuse and Bipolar, Depressed Axis II: Deferred Axis III:  Past Medical History  Diagnosis Date  . Mental disorder   . Hypertension   . Depression   . Bipolar 1 disorder   . Irregular heart rate   . Asthma     ADL's:  Intact  Sleep: Good  Appetite:  Good  Suicidal Ideation:  Patient endorses thoughts with a plan to cut his wrists Homicidal Ideation:  Patient endorses thoughts to cut or beat people who have put him in a bad position AEB (as evidenced by):  Psychiatric Specialty Exam: Review of Systems  Constitutional: Negative.   HENT: Negative.   Eyes: Negative.   Respiratory: Negative.   Cardiovascular: Negative.   Gastrointestinal: Negative.   Genitourinary: Negative.   Musculoskeletal: Negative.   Skin: Negative.   Neurological: Negative.   Endo/Heme/Allergies: Negative.   Psychiatric/Behavioral: Positive for depression, suicidal ideas and hallucinations. The patient is nervous/anxious. The patient does not have insomnia.     Blood pressure 113/70, pulse 97, temperature 97.5 F (36.4 C), temperature  source Oral, resp. rate 18, height 6' (1.829 m), weight 124.739 kg (275 lb).Body mass index is 37.29 kg/(m^2).  General Appearance: Disheveled  Eye Contact::  Good  Speech:  Clear and Coherent with a mild speech impediment  Volume:  Normal  Mood:  Anxious  Affect:  Congruent  Thought Process:  Circumstantial and Goal Directed  Orientation:  Full (Time, Place, and Person)  Thought Content:  Obsessions  Suicidal Thoughts:  Yes.  with intent/plan  Homicidal Thoughts:  Yes.  with intent/plan  Memory:  Immediate;   Good Recent;   Good Remote;   Good  Judgement:  Impaired  Insight:  Lacking  Psychomotor Activity:  Increased  Concentration:  Good  Recall:  Good  Akathisia:  No  Handed:  Left  AIMS (if indicated):     Assets:  Communication Skills Desire for Improvement  Sleep:  Number of Hours: 6.75   Current Medications: Current Facility-Administered Medications  Medication Dose Route Frequency Provider Last Rate Last Dose  . acetaminophen (TYLENOL) tablet 650 mg  650 mg Oral Q6H PRN Kerry Hough, PA-C      . albuterol (PROVENTIL HFA;VENTOLIN HFA) 108 (90 BASE) MCG/ACT inhaler 2 puff  2 puff Inhalation Q6H PRN Verne Spurr, PA-C   2 puff at 12/07/12 1153  . chlordiazePOXIDE (LIBRIUM) capsule 25 mg  25 mg Oral Q6H PRN Fransisca Kaufmann, NP      . chlordiazePOXIDE (LIBRIUM) capsule 25 mg  25 mg Oral TID Fransisca Kaufmann,  NP   25 mg at 12/07/12 1154   Followed by  . [START ON 12/08/2012] chlordiazePOXIDE (LIBRIUM) capsule 25 mg  25 mg Oral BH-qamhs Fransisca Kaufmann, NP       Followed by  . [START ON 12/09/2012] chlordiazePOXIDE (LIBRIUM) capsule 25 mg  25 mg Oral Daily Fransisca Kaufmann, NP      . Melene Muller ON 12/08/2012] DULoxetine (CYMBALTA) DR capsule 60 mg  60 mg Oral Daily Jorje Guild, PA-C      . gabapentin (NEURONTIN) capsule 300 mg  300 mg Oral TID Jorje Guild, PA-C      . hydrOXYzine (ATARAX/VISTARIL) tablet 25 mg  25 mg Oral Q6H PRN Fransisca Kaufmann, NP      . ibuprofen (ADVIL,MOTRIN) tablet 800 mg  800 mg  Oral Q8H PRN Kerry Hough, PA-C   800 mg at 12/05/12 1191  . loperamide (IMODIUM) capsule 2-4 mg  2-4 mg Oral PRN Fransisca Kaufmann, NP      . magnesium hydroxide (MILK OF MAGNESIA) suspension 30 mL  30 mL Oral Daily PRN Verne Spurr, PA-C      . multivitamin with minerals tablet 1 tablet  1 tablet Oral Daily Fransisca Kaufmann, NP   1 tablet at 12/07/12 1153  . nicotine (NICODERM CQ - dosed in mg/24 hours) patch 14 mg  14 mg Transdermal Daily Nehemiah Settle, MD   14 mg at 12/07/12 0815  . ondansetron (ZOFRAN-ODT) disintegrating tablet 4 mg  4 mg Oral Q6H PRN Fransisca Kaufmann, NP      . Melene Muller ON 12/08/2012] PARoxetine (PAXIL) tablet 20 mg  20 mg Oral Daily Jorje Guild, PA-C      . QUEtiapine (SEROQUEL) tablet 100 mg  100 mg Oral BH-q8a6p Fransisca Kaufmann, NP   100 mg at 12/07/12 0815  . QUEtiapine (SEROQUEL) tablet 200 mg  200 mg Oral QHS Nehemiah Settle, MD   200 mg at 12/06/12 2204  . thiamine (VITAMIN B-1) tablet 100 mg  100 mg Oral Daily Fransisca Kaufmann, NP   100 mg at 12/07/12 1153  . traZODone (DESYREL) tablet 200 mg  200 mg Oral QHS Verne Spurr, PA-C   200 mg at 12/06/12 2204    Lab Results: No results found for this or any previous visit (from the past 48 hour(s)).  Physical Findings: AIMS: Facial and Oral Movements Muscles of Facial Expression: None, normal Lips and Perioral Area: None, normal Jaw: None, normal Tongue: None, normal,Extremity Movements Upper (arms, wrists, hands, fingers): None, normal Lower (legs, knees, ankles, toes): None, normal, Trunk Movements Neck, shoulders, hips: None, normal, Overall Severity Severity of abnormal movements (highest score from questions above): None, normal Incapacitation due to abnormal movements: None, normal Patient's awareness of abnormal movements (rate only patient's report): No Awareness, Dental Status Current problems with teeth and/or dentures?: No Does patient usually wear dentures?: No  CIWA:  CIWA-Ar Total: 0 COWS:  COWS Total  Score: 0  Treatment Plan Summary: Daily contact with patient to assess and evaluate symptoms and progress in treatment Medication management  Plan: We will increase his Neurontin to 300 mg 3 times daily, and Cymbalta to 60 mg daily. We'll decrease his Paxil to 20 mg daily with the intention of discontinuing it.  Medical Decision Making Problem Points:  Established problem, stable/improving (1) and Review of psycho-social stressors (1) Data Points:  Review or order clinical lab tests (1) Review of new medications or change in dosage (2)  I certify that inpatient services furnished can reasonably be expected to  improve the patient's condition.   Tavish Gettis 12/07/2012, 12:05 PM

## 2012-12-08 DIAGNOSIS — F1010 Alcohol abuse, uncomplicated: Secondary | ICD-10-CM

## 2012-12-08 NOTE — Progress Notes (Signed)
Patient ID: [name] [name], male   DOB: 1981/06/01, 32 y.o.   MRN: [id number] D)  Has been out and about on the hall this evening, seems brighter, laughing and interacting appropriately with select peers and staff.  States has a tooth that has been starting to ache at times, a molar on the left side , also c/o backache and requested motrin.  Came back at hs for meds after attending group and went to bed shortly after that.  Has been sleeping, snoring softly, no other c/o's voiced. A)  Will continue to monitor for safety, continuePOC R)  Safety maintained
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
## 2012-12-08 NOTE — [facility] Group Notes (Signed)
[facility] Group Notes: (Clinical Social Work)   12/08/2012      Type of Therapy:  Group Therapy   Participation Level:  Did Not Attend    Ambrose Mantle, LCSW 12/08/2012, 4:31 PM

## 2012-12-08 NOTE — Progress Notes (Signed)
Psychoeducational Group Note  Date:  12/07/2012 Time:  2000  Group Topic/Focus:  Wrap-Up Group:   The focus of this group is to help patients review their daily goal of treatment and discuss progress on daily workbooks.  Participation Level: Did Not Attend  Participation Quality:  Not Applicable  Affect:  Not Applicable  Cognitive:  Not Applicable  Insight:  Not Applicable  Engagement in Group: Not Applicable  Additional Comments:  The patient did not attend group since he was asleep in his room.   Moe Brier S 12/08/2012, 12:08 AM

## 2012-12-08 NOTE — Progress Notes (Signed)
Patient ID: [name] [name], male   DOB: Feb 24, 1981, 32 y.o.   MRN: [id number] [facility] MD Progress Note  12/08/2012 8:24 PM [name] [name]  MRN:  [id number] Subjective:    More calmer today. Poor insight. Feels like getting better now. Interested for drug rehab now. Fair sleep.  Diagnosis:   Axis I: Alcohol Abuse and Bipolar, Depressed Axis II: Deferred Axis III:  Past Medical History  Diagnosis Date  . Mental disorder   . Hypertension   . Depression   . Bipolar 1 disorder   . Irregular heart rate   . Asthma     ADL's:  Intact  Sleep: Good  Appetite:  Good  Suicidal Ideation:  Patient endorses thoughts with a plan to cut his wrists Homicidal Ideation:  Patient endorses thoughts to cut or beat people who have put him in a bad position AEB (as evidenced by):  Psychiatric Specialty Exam: Review of Systems  Constitutional: Negative.   HENT: Negative.   Eyes: Negative.   Respiratory: Negative.   Cardiovascular: Negative.   Gastrointestinal: Negative.   Genitourinary: Negative.   Musculoskeletal: Negative.   Skin: Negative.   Neurological: Negative.   Endo/Heme/Allergies: Negative.   Psychiatric/Behavioral: Positive for depression, suicidal ideas and hallucinations. The patient is nervous/anxious. The patient does not have insomnia.     Blood pressure 109/69, pulse 98, temperature 97.9 F (36.6 C), temperature source Oral, resp. rate 18, height 6' (1.829 m), weight 124.739 kg (275 lb).Body mass index is 37.29 kg/(m^2).  General Appearance: Disheveled  Eye Contact::  Good  Speech:  Clear and Coherent with a mild speech impediment  Volume:  Normal  Mood:  Anxious  Affect:  Congruent  Thought Process:  Circumstantial and Goal Directed  Orientation:  Full (Time, Place, and Person)  Thought Content:  Obsessions  Suicidal Thoughts:  deneis  Homicidal Thoughts:  denies  Memory:  Immediate;   Good Recent;   Good Remote;   Good  Judgement:  Impaired  Insight:   Lacking  Psychomotor Activity:  Increased  Concentration:  Good  Recall:  Good  Akathisia:  No  Handed:  Left  AIMS (if indicated):     Assets:  Communication Skills Desire for Improvement  Sleep:  Number of Hours: 6.75   Current Medications: Current Facility-Administered Medications  Medication Dose Route Frequency Provider Last Rate Last Dose  . acetaminophen (TYLENOL) tablet 650 mg  650 mg Oral Q6H PRN Kerry Hough, PA-C   650 mg at 12/07/12 1715  . albuterol (PROVENTIL HFA;VENTOLIN HFA) 108 (90 BASE) MCG/ACT inhaler 2 puff  2 puff Inhalation Q6H PRN Verne Spurr, PA-C   2 puff at 12/07/12 1153  . chlordiazePOXIDE (LIBRIUM) capsule 25 mg  25 mg Oral Q6H PRN Fransisca Kaufmann, NP      . chlordiazePOXIDE (LIBRIUM) capsule 25 mg  25 mg Oral BH-qamhs Fransisca Kaufmann, NP   25 mg at 12/08/12 0758   Followed by  . [START ON 12/09/2012] chlordiazePOXIDE (LIBRIUM) capsule 25 mg  25 mg Oral Daily Fransisca Kaufmann, NP      . DULoxetine (CYMBALTA) DR capsule 60 mg  60 mg Oral Daily Jorje Guild, PA-C   60 mg at 12/08/12 0758  . gabapentin (NEURONTIN) capsule 300 mg  300 mg Oral TID Jorje Guild, PA-C   300 mg at 12/08/12 1702  . hydrOXYzine (ATARAX/VISTARIL) tablet 25 mg  25 mg Oral Q6H PRN Fransisca Kaufmann, NP      . ibuprofen (ADVIL,MOTRIN) tablet 800 mg  800  mg Oral Q8H PRN Kerry Hough, PA-C   800 mg at 12/05/12 1610  . loperamide (IMODIUM) capsule 2-4 mg  2-4 mg Oral PRN Fransisca Kaufmann, NP      . magnesium hydroxide (MILK OF MAGNESIA) suspension 30 mL  30 mL Oral Daily PRN Verne Spurr, PA-C      . multivitamin with minerals tablet 1 tablet  1 tablet Oral Daily Fransisca Kaufmann, NP   1 tablet at 12/08/12 0758  . nicotine (NICODERM CQ - dosed in mg/24 hours) patch 14 mg  14 mg Transdermal Daily Nehemiah Settle, MD   14 mg at 12/08/12 0802  . ondansetron (ZOFRAN-ODT) disintegrating tablet 4 mg  4 mg Oral Q6H PRN Fransisca Kaufmann, NP      . PARoxetine (PAXIL) tablet 20 mg  20 mg Oral Daily Jorje Guild, PA-C   20 mg at  12/08/12 0758  . QUEtiapine (SEROQUEL) tablet 100 mg  100 mg Oral [id number] Fransisca Kaufmann, NP   100 mg at 12/08/12 1702  . QUEtiapine (SEROQUEL) tablet 200 mg  200 mg Oral QHS Nehemiah Settle, MD   200 mg at 12/06/12 2204  . thiamine (VITAMIN B-1) tablet 100 mg  100 mg Oral Daily Fransisca Kaufmann, NP   100 mg at 12/08/12 0819  . traZODone (DESYREL) tablet 200 mg  200 mg Oral QHS Verne Spurr, PA-C   200 mg at 12/06/12 2204    Lab Results: No results found for this or any previous visit (from the past 48 hour(s)).  Physical Findings: AIMS: Facial and Oral Movements Muscles of Facial Expression: None, normal Lips and Perioral Area: None, normal Jaw: None, normal Tongue: None, normal,Extremity Movements Upper (arms, wrists, hands, fingers): None, normal Lower (legs, knees, ankles, toes): None, normal, Trunk Movements Neck, shoulders, hips: None, normal, Overall Severity Severity of abnormal movements (highest score from questions above): None, normal Incapacitation due to abnormal movements: None, normal Patient's awareness of abnormal movements (rate only patient's report): No Awareness, Dental Status Current problems with teeth and/or dentures?: No Does patient usually wear dentures?: No  CIWA:  CIWA-Ar Total: 0 COWS:  COWS Total Score: 0  Treatment Plan Summary: Daily contact with patient to assess and evaluate symptoms and progress in treatment Medication management  Plan:   Continue current meds  Medical Decision Making Problem Points:  Established problem, stable/improving (1) and Review of psycho-social stressors (1) Data Points:  Review or order clinical lab tests (1) Review of new medications or change in dosage (2)  I certify that inpatient services furnished can reasonably be expected to improve the patient's condition.   Wonda Cerise 12/08/2012, 8:24 PM

## 2012-12-08 NOTE — Progress Notes (Signed)
Patient ID: [name] [name], male   DOB: 05/05/1981, 32 y.o.   MRN: [id number] D)   Was tired this evening, went to bed and slept through group, and has continued to sleep tonight.  Resp reg unlabored, eyes closed, no c/o's voiced.  A)  Will continue to monitor for safety R) Safety maintained.

## 2012-12-09 MED ORDER — QUETIAPINE FUMARATE 200 MG PO TABS: 200.0000 mg | ORAL_TABLET | Freq: Every day | ORAL | Status: DC

## 2012-12-09 MED ORDER — TRAZODONE HCL 100 MG PO TABS: 200.0000 mg | ORAL_TABLET | Freq: Every day | ORAL | Status: DC

## 2012-12-09 MED ORDER — ALBUTEROL SULFATE HFA 108 (90 BASE) MCG/ACT IN AERS: 2.0000 | INHALATION_SPRAY | Freq: Four times a day (QID) | RESPIRATORY_TRACT | Status: DC | PRN

## 2012-12-09 MED ORDER — QUETIAPINE FUMARATE 100 MG PO TABS: 100.0000 mg | ORAL_TABLET | ORAL | Status: DC

## 2012-12-09 MED ORDER — GABAPENTIN 300 MG PO CAPS: 300.0000 mg | ORAL_CAPSULE | Freq: Three times a day (TID) | ORAL | Status: DC

## 2012-12-09 MED ORDER — DULOXETINE HCL 60 MG PO CPEP: 60.0000 mg | ORAL_CAPSULE | Freq: Every day | ORAL | Status: DC

## 2012-12-09 MED ORDER — PAROXETINE HCL 20 MG PO TABS: 20.0000 mg | ORAL_TABLET | Freq: Every day | ORAL | Status: DC

## 2012-12-09 NOTE — Progress Notes (Signed)
Discharge Note:  Patient discharged home with bus ticket.   Denied SI and HI.  Denied A/V hallucinations.  Denied pain.  Excited to go home.  Patient received all his belongings, clothing, medications, prescriptions.  Suicide prevention information given and discussed with patient who stated he understood and had no questions.  Patient stated he appreciated all the staff has done to assist him while at [facility].

## 2012-12-09 NOTE — BHH Group Notes (Signed)
[facility] LCSW Group Therapy  12/09/2012  1:15 PM   Type of Therapy:  Group Therapy  Participation Level:  Did Not Attend  Reyes Ivan, LCSWA 12/09/2012 2:43 PM

## 2012-12-09 NOTE — Discharge Summary (Signed)
Physician Discharge Summary Note  Patient:  [name] [name] is an 32 y.o., male MRN:  [id number] DOB:  November 16, 1980 Patient phone:  [phone] (home)  Patient address:   [address]  [address] [place] [address]   Date of Admission:  12/04/2012 Date of Discharge: 12/09/12  Discharge Diagnoses: Active Problems:   * No active hospital problems. *  Axis Diagnosis:  AXIS I: Alcohol Abuse, Bipolar Disorder Depressed  AXIS II: Deferred  AXIS III:  Past Medical History   Diagnosis  Date   .  Mental disorder    .  Hypertension    .  Depression    .  Bipolar 1 disorder    .  Irregular heart rate    .  Asthma     AXIS IV: other psychosocial or environmental problems  AXIS V: 61-70 mild symptoms  Level of Care:  OP  Hospital Course:   [name] [name] is an 32 y.o. male presenting to [name] ED with thoughts of suicide. Pt has a plan to jump into traffic, jump off a bridge, and/or cut her wrist. Patient sts he is unable to contract for safety. His suicidal ideations are triggers by friends, coworkers, and room mates "picking on me and putting me down". Pt has a history of 1 prior suicide attempt in which he cut his wrist. Onset of SI was 3-4 weeks ago. Patient also homicidal toward his friends, coworkers, and room mates. Patient has no specific plan to harm others. However, unable to to contract from safety against harming others. Onset of HI was 3-4 weeks ago. Patient reports a history of AVH's. He reports auditory hallucinations-voices telling him to "kill himself" and "others". Patient's voices have been on-going for several weeks.  While a patient in this hospital, [name] [name] was enrolled in group counseling and activities as well as received the following medication Current facility-administered medications:acetaminophen (TYLENOL) tablet 650 mg, 650 mg, Oral, Q6H PRN, Mena Goes Simon, PA-C, 650 mg at 12/07/12 1715;  albuterol (PROVENTIL HFA;VENTOLIN HFA) 108 (90  BASE) MCG/ACT inhaler 2 puff, 2 puff, Inhalation, Q6H PRN, Verne Spurr, PA-C, 2 puff at 12/07/12 1153;  chlordiazePOXIDE (LIBRIUM) capsule 25 mg, 25 mg, Oral, Q6H PRN, Fransisca Kaufmann, NP DULoxetine (CYMBALTA) DR capsule 60 mg, 60 mg, Oral, Daily, Jorje Guild, PA-C, 60 mg at 12/09/12 0755;  gabapentin (NEURONTIN) capsule 300 mg, 300 mg, Oral, TID, Jorje Guild, PA-C, 300 mg at 12/09/12 4010;  hydrOXYzine (ATARAX/VISTARIL) tablet 25 mg, 25 mg, Oral, Q6H PRN, Fransisca Kaufmann, NP;  ibuprofen (ADVIL,MOTRIN) tablet 800 mg, 800 mg, Oral, Q8H PRN, Kerry Hough, PA-C, 800 mg at 12/09/12 0645 loperamide (IMODIUM) capsule 2-4 mg, 2-4 mg, Oral, PRN, Fransisca Kaufmann, NP;  magnesium hydroxide (MILK OF MAGNESIA) suspension 30 mL, 30 mL, Oral, Daily PRN, Verne Spurr, PA-C;  multivitamin with minerals tablet 1 tablet, 1 tablet, Oral, Daily, Fransisca Kaufmann, NP, 1 tablet at 12/09/12 0755;  nicotine (NICODERM CQ - dosed in mg/24 hours) patch 14 mg, 14 mg, Transdermal, Daily, Nehemiah Settle, MD, 14 mg at 12/09/12 0646 ondansetron (ZOFRAN-ODT) disintegrating tablet 4 mg, 4 mg, Oral, Q6H PRN, Fransisca Kaufmann, NP;  PARoxetine (PAXIL) tablet 20 mg, 20 mg, Oral, Daily, Jorje Guild, PA-C, 20 mg at 12/09/12 0756;  QUEtiapine (SEROQUEL) tablet 100 mg, 100 mg, Oral, [id number], Fransisca Kaufmann, NP, 100 mg at 12/09/12 0756;  QUEtiapine (SEROQUEL) tablet 200 mg, 200 mg, Oral, QHS, Nehemiah Settle, MD, 200 mg at 12/08/12 2114 thiamine (VITAMIN  B-1) tablet 100 mg, 100 mg, Oral, Daily, Fransisca Kaufmann, NP, 100 mg at 12/09/12 0756;  traZODone (DESYREL) tablet 200 mg, 200 mg, Oral, QHS, Verne Spurr, PA-C, 200 mg at 12/08/12 2113 Patient's medications were managed during his admission. He was started on Cymbalta DR, which was titrated to 60 mg to help with depression and pain. His psychosis was resolved with Seroquel 100 mg bid and 200 mg at bedtime. Patient was given Trazodone 200 mg at bedtime to help improve the quality of his sleep. He was also  started on Neurontin to help with pain and mood stability, which was titrated to 300 mg TID by discharge. He reported drinking alcohol daily prior to his admission and began to reports symptoms of withdrawal. The patient completed the librium detox protocol during his stay in the hospital. Patient attended treatment team meeting this am and met with treatment team members. He is optimistic about following with [facility] and plans to stay with his sister until Wednesday morning. Patient reports that he will not have access to alcohol at his sister's and will be able to maintain his sobriety until treatment. He also denied any suicidal thoughts and denies hearing any voices. Pt symptoms, treatment plan and response to treatment discussed. [name] [name] endorsed that their symptoms have improved. Pt also stated that they are stable for discharge.  In other to control Active Problems:   * No active hospital problems. * , they will continue psychiatric care on outpatient basis. They will follow-up at  Follow-up Information   Follow up with [facility] On 12/11/2012. (Wednesday, December 11, 2012 at 8:00 AM)    Contact information:   [address] [place], [place]   [address]  [phone]    .  In addition they were instructed to take all your medications as prescribed by your mental healthcare provider, to report any adverse effects and or reactions from your medicines to your outpatient provider promptly, patient is instructed and cautioned to not engage in alcohol and or illegal drug use while on prescription medicines, in the event of worsening symptoms, patient is instructed to call the crisis hotline, 911 and or go to the nearest ED for appropriate evaluation and treatment of symptoms.   Upon discharge, patient adamantly denies suicidal, homicidal ideations, auditory, visual hallucinations and or delusional thinking. They left [facility] with all personal belongings in no apparent distress.  Consults:   See electronic record for details  Significant Diagnostic Studies:  See electronic record for details  Discharge Vitals:   Blood pressure 114/75, pulse 92, temperature 97.6 F (36.4 C), temperature source Oral, resp. rate 17, height 6' (1.829 m), weight 124.739 kg (275 lb)..  Mental Status Exam: See Mental Status Examination and Suicide Risk Assessment completed by Attending Physician prior to discharge.  Discharge destination:  Home  Is patient on multiple antipsychotic therapies at discharge:  No  Has Patient had three or more failed trials of antipsychotic monotherapy by history: N/A Recommended Plan for Multiple Antipsychotic Therapies: N/A    Medication List       Indication   albuterol 108 (90 BASE) MCG/ACT inhaler  Commonly known as:  PROVENTIL HFA;VENTOLIN HFA  Inhale 2 puffs into the lungs every 6 (six) hours as needed for wheezing.   Indication:  Asthma, Exercise-Induced Bronchospasm     DULoxetine 60 MG capsule  Commonly known as:  CYMBALTA  Take 1 capsule (60 mg total) by mouth daily. For depression   Indication:  Major Depressive  Disorder     gabapentin 300 MG capsule  Commonly known as:  NEURONTIN  Take 1 capsule (300 mg total) by mouth 3 (three) times daily. For mood stability   Indication:  Agitation, Pain, back pain     PARoxetine 20 MG tablet  Commonly known as:  PAXIL  Take 1 tablet (20 mg total) by mouth daily. For anxiety   Indication:  Generalized Anxiety Disorder     QUEtiapine 100 MG tablet  Commonly known as:  SEROQUEL  Take 1 tablet (100 mg total) by mouth 2 (two) times daily at 8 am and 6 pm. For mood control.   Indication:  Manic Phase of Manic-Depression     QUEtiapine 200 MG tablet  Commonly known as:  SEROQUEL  Take 1 tablet (200 mg total) by mouth at bedtime.   Indication:  Depressive Phase of Manic-Depression, Trouble Sleeping     traZODone 100 MG tablet  Commonly known as:  DESYREL  Take 2 tablets (200 mg total) by mouth at  bedtime.   Indication:  Trouble Sleeping, Major Depressive Disorder           Follow-up Information   Follow up with [facility] On 12/11/2012. (Wednesday, December 11, 2012 at 8:00 AM)    Contact information:   [address] [place], [place]   [address]  [phone]     Follow-up recommendations:   Activities: Resume typical activities Diet: Resume typical diet Tests: none Other: Follow up with outpatient provider and report any side effects to out patient prescriber. Continue to work the relapse prevention plan Comments:  Take all your medications as prescribed by your mental healthcare provider. Report any adverse effects and or reactions from your medicines to your outpatient provider promptly. Patient is instructed and cautioned to not engage in alcohol and or illegal drug use while on prescription medicines. In the event of worsening symptoms, patient is instructed to call the crisis hotline, 911 and or go to the nearest ED for appropriate evaluation and treatment of symptoms. Follow-up with your primary care provider for your other medical issues, concerns and or health care needs.  SignedFransisca Kaufmann NP-C 12/09/2012 10:49 AM Agree with assessment and plan Reymundo Poll. Dub Mikes, M.D.

## 2012-12-09 NOTE — Tx Team (Signed)
Interdisciplinary Treatment Plan Update (Adult)  Date: 12/09/2012  Time Reviewed:  9:45 AM  Progress in Treatment: Attending groups: Yes Participating in groups:  Yes Taking medication as prescribed:  Yes Tolerating medication:  Yes Family/Significant othe contact made: Yes Patient understands diagnosis:  Yes Discussing patient identified problems/goals with staff:  Yes Medical problems stabilized or resolved:  Yes Denies suicidal/homicidal ideation: Yes Issues/concerns per patient self-inventory:  Yes Other:  New problem(s) identified: N/A  Discharge Plan or Barriers: Pt has follow up scheduled at [facility] for further treatment .    Reason for Continuation of Hospitalization: Stable to d/c  Comments: N/A  Estimated length of stay: D/C today  For review of initial/current patient goals, please see plan of care.  Attendees: Patient:  [name] [name]  12/09/2012 10:22 AM   Family:     Physician:   12/09/2012 9:46 AM   Nursing:   Chinita Greenland, RN 12/09/2012 9:46 AM   Clinical Social Worker:  Reyes Ivan, LCSWA 12/09/2012 9:46 AM   Other: Fransisca Kaufmann, NP 12/09/2012 9:46 AM   Other:  Frankey Shown, MA care coordination 12/09/2012 9:46 AM   Other:  Quintella Reichert, RN 12/09/2012 9:46 AM   Other:     Other:    Other:    Other:    Other:    Other:    Other:     Scribe for Treatment Team:   Carmina Miller, 12/09/2012 9:46 AM

## 2012-12-09 NOTE — Progress Notes (Signed)
Grief and Loss Group  Group members processed their feelings and experiences related to grief and loss. Group members shared helpful coping strategies in dealing with their grief and loss.   The pt left halfway through the group session. Pt was present during the first half of group and did not make any verbal responses.   Sherol Dade  Counselor Intern  Haroldine Laws

## 2012-12-09 NOTE — Progress Notes (Signed)
[facility] Adult Case Management Discharge Plan :  Will you be returning to the same living situation after discharge: Yes,  returning home until going for further treatment At discharge, do you have transportation home?:Yes,  access to transportation Do you have the ability to pay for your medications:Yes,  access to meds  Release of information consent forms completed and in the chart;  Patient's signature needed at discharge.  Patient to Follow up at: Follow-up Information   Follow up with [facility] On 12/11/2012. (Wednesday, December 11, 2012 at 8:00 AM - arrive promptly at 8:00 am!!)    Contact information:   [address] [place], [place]   [address]  [phone]      Patient denies SI/HI:   Yes,  denies SI/HI    Safety Planning and Suicide Prevention discussed:  Yes,  discussed with pt and pt's friend.  See suicide prevention note.  Carmina Miller 12/09/2012, 12:34 PM

## 2012-12-09 NOTE — BHH Suicide Risk Assessment (Signed)
Suicide Risk Assessment  Discharge Assessment     Demographic Factors:  Male and Caucasian  Mental Status Per Nursing Assessment::   On Admission:  Suicide plan;Self-harm thoughts;Intention to act on plan to harm others  Current Mental Status by Physician: In full contact with reality. There are no suicidal ideas, plans or intent. His mood is euthymic. His affect is appropriate. He is willing and motivated to pursue further outpatient basis. Plans to be admitted to to [facility] on Thursday   Loss Factors: NA  Historical Factors: NA  Risk Reduction Factors:   Positive social support  Continued Clinical Symptoms:  Depression:   Comorbid alcohol abuse/dependence Alcohol/Substance Abuse/Dependencies  Cognitive Features That Contribute To Risk:  Closed-mindedness Polarized thinking Thought constriction (tunnel vision)    Suicide Risk:  Minimal: No identifiable suicidal ideation.  Patients presenting with no risk factors but with morbid ruminations; may be classified as minimal risk based on the severity of the depressive symptoms  Discharge Diagnoses:   AXIS I:  Alcohol Abuse, Bipolar Disorder Depressed AXIS II:  Deferred AXIS III:   Past Medical History  Diagnosis Date  . Mental disorder   . Hypertension   . Depression   . Bipolar 1 disorder   . Irregular heart rate   . Asthma    AXIS IV:  other psychosocial or environmental problems AXIS V:  61-70 mild symptoms  Plan Of Care/Follow-up recommendations:  Activity:  as tolerated Diet:  regular Follow up outpatient basis/[facility] residential treatment Is patient on multiple antipsychotic therapies at discharge:  No   Has Patient had three or more failed trials of antipsychotic monotherapy by history:  No  Recommended Plan for Multiple Antipsychotic Therapies: N/A   Yeny Schmoll A 12/09/2012, 2:04 PM

## 2012-12-09 NOTE — Progress Notes (Addendum)
Patient ID: [name] [name], male   DOB: 08/16/1980, 32 y.o.   MRN: [id number] D:  Patient's self inventory sheet, patient sleeps well, has good appetite, high energy level, good attention span.  Denied depression and hopelessness.  Has experienced chilling and agitation.  Denied SI.  Has experienced pain in past 24 hours.  Pain goal #7, worst pain #7.  Plans to go to sister's house 2 days and then go to [facility].  Does have discharge plans.  No problems taking meds after discharge. A:  Medications administered per MD orders.  Emotional support and encouragement given to patient.   R:  Denied SI and HI.  Contracts for safety.   Denied A/V hallucinations.  Denied pain.  Will continue to monitor for safety with 15 minute checks.  Safety maintained.  Patient stated several days ago while playing ball, he twisted his right ankle, that he did not fall.  Stated he was given ice pack and tylenol which helped his ankle.  Denied pain and denied swelling to right ankle.

## 2012-12-09 NOTE — BHH Group Notes (Signed)
[facility] LCSW Aftercare Discharge Planning Group Note   12/09/2012 8:45 AM  Participation Quality:  Alert and Appropriate   Mood/Affect:  Appropriate and bright  Depression Rating:  0  Anxiety Rating:  0  Thoughts of Suicide:  Pt denies SI/HI  Will you contract for safety?   Yes  Current AVH:  Pt denies  Plan for Discharge/Comments:  Pt attended discharge planning group and actively participated in group.  CSW provided pt with today's workbook.  Pt reports feeling stable to d/c today.  Pt states that he will stay at home with sister and prepare to go to [facility] on Wednesday for further treatment.  Pt verbalized his d/c plan and plan to stay clean and sober until treatment date.  No further needs voiced by pt at this time.    Transportation Means: Pt reports having access to transportation - provided pt bus pass (in chart for d/c)  Supports: Pt named his friend as supportive  Reyes Ivan, LCSWA 12/09/2012 11:31 AM

## 2012-12-09 NOTE — BHH Suicide Risk Assessment (Signed)
BHH INPATIENT:  Family/Significant Other Suicide Prevention Education  Suicide Prevention Education:  Education Completed; Karl Bales - friend [phone]),  (name of family member/significant other) has been identified by the patient as the family member/significant other with whom the patient will be residing, and identified as the person(s) who will aid the patient in the event of a mental health crisis (suicidal ideations/suicide attempt).  With written consent from the patient, the family member/significant other has been provided the following suicide prevention education, prior to the and/or following the discharge of the patient.  The suicide prevention education provided includes the following:  Suicide risk factors  Suicide prevention and interventions  National Suicide Hotline telephone number  [facility] assessment telephone number  [facility] Emergency Assistance 911  [facility] and/or Residential Mobile Crisis Unit telephone number  Request made of family/significant other to:  Remove weapons (e.g., guns, rifles, knives), all items previously/currently identified as safety concern.    Remove drugs/medications (over-the-counter, prescriptions, illicit drugs), all items previously/currently identified as a safety concern.  The family member/significant other verbalizes understanding of the suicide prevention education information provided.  The family member/significant other agrees to remove the items of safety concern listed above.  Carmina Miller 12/09/2012, 10:27 AM

## 2012-12-09 NOTE — Progress Notes (Signed)
Adult Psychoeducational Group Note  Date:  12/09/2012 Time:  2:26 PM  Group Topic/Focus:  Wellness Toolbox:   The focus of this group is to discuss various aspects of wellness, balancing those aspects and exploring ways to increase the ability to experience wellness.  Patients will create a wellness toolbox for use upon discharge.  Participation Level:  Active  Participation Quality:  Appropriate, Attentive and Sharing  Affect:  Appropriate  Cognitive:  Alert and Appropriate  Insight: Appropriate  Engagement in Group:  Engaged  Modes of Intervention:  Discussion  Additional Comments:  Pt was appropriate and sharing while attending group. Pt shared that he wanted to quit drinking to improve his wellness.   Sharyn Lull 12/09/2012, 2:26 PM

## 2012-12-12 NOTE — Progress Notes (Signed)
Patient Discharge Instructions:  After Visit Summary (AVS):   Faxed to:  12/12/12 Discharge Summary Note:   Faxed to:  12/12/12 Psychiatric Admission Assessment Note:   Faxed to:  12/12/12 Suicide Risk Assessment - Discharge Assessment:   Faxed to:  12/12/12 Faxed/Sent to the Next Level Care provider:  12/12/12 Faxed to [facility] @ [phone]  Jerelene Redden, 12/12/2012, 3:29 PM

## 2012-12-16 ENCOUNTER — Emergency Department (HOSPITAL_COMMUNITY)
Admission: EM | Admit: 2012-12-16 | Discharge: 2012-12-17 | Disposition: A | Discharge status: Home / Self Care | Payer: Self-pay | Attending: Emergency Medicine | Admitting: Emergency Medicine

## 2012-12-16 ENCOUNTER — Emergency Department (HOSPITAL_COMMUNITY): Payer: Self-pay

## 2012-12-16 ENCOUNTER — Encounter (HOSPITAL_COMMUNITY): Payer: Self-pay | Admitting: *Deleted

## 2012-12-16 DIAGNOSIS — Y990 Civilian activity done for income or pay: Secondary | ICD-10-CM | POA: Insufficient documentation

## 2012-12-16 DIAGNOSIS — Y9389 Activity, other specified: Secondary | ICD-10-CM | POA: Insufficient documentation

## 2012-12-16 DIAGNOSIS — W260XXA Contact with knife, initial encounter: Secondary | ICD-10-CM | POA: Insufficient documentation

## 2012-12-16 DIAGNOSIS — F489 Nonpsychotic mental disorder, unspecified: Secondary | ICD-10-CM | POA: Insufficient documentation

## 2012-12-16 DIAGNOSIS — I10 Essential (primary) hypertension: Secondary | ICD-10-CM | POA: Insufficient documentation

## 2012-12-16 DIAGNOSIS — Z79899 Other long term (current) drug therapy: Secondary | ICD-10-CM | POA: Insufficient documentation

## 2012-12-16 DIAGNOSIS — IMO0002 Reserved for concepts with insufficient information to code with codable children: Secondary | ICD-10-CM

## 2012-12-16 DIAGNOSIS — F17200 Nicotine dependence, unspecified, uncomplicated: Secondary | ICD-10-CM | POA: Insufficient documentation

## 2012-12-16 DIAGNOSIS — S21109A Unspecified open wound of unspecified front wall of thorax without penetration into thoracic cavity, initial encounter: Secondary | ICD-10-CM | POA: Insufficient documentation

## 2012-12-16 DIAGNOSIS — S51809A Unspecified open wound of unspecified forearm, initial encounter: Secondary | ICD-10-CM | POA: Insufficient documentation

## 2012-12-16 DIAGNOSIS — Y9229 Other specified public building as the place of occurrence of the external cause: Secondary | ICD-10-CM | POA: Insufficient documentation

## 2012-12-16 DIAGNOSIS — F319 Bipolar disorder, unspecified: Secondary | ICD-10-CM | POA: Insufficient documentation

## 2012-12-16 DIAGNOSIS — I499 Cardiac arrhythmia, unspecified: Secondary | ICD-10-CM | POA: Insufficient documentation

## 2012-12-16 DIAGNOSIS — J45909 Unspecified asthma, uncomplicated: Secondary | ICD-10-CM | POA: Insufficient documentation

## 2012-12-16 LAB — POCT I-STAT, CHEM 8
Calcium, Ion: 1.19 mmol/L (ref 1.12–1.23)
Glucose, Bld: 91 mg/dL (ref 70–99)
HCT: 39 % (ref 39.0–52.0)
Hemoglobin: 13.3 g/dL (ref 13.0–17.0)
Potassium: 3.2 mEq/L — ABNORMAL LOW (ref 3.5–5.1)
TCO2: 24 mmol/L (ref 0–100)

## 2012-12-16 LAB — BASIC METABOLIC PANEL
CO2: 25 mEq/L (ref 19–32)
Calcium: 9.2 mg/dL (ref 8.4–10.5)
Creatinine, Ser: 0.74 mg/dL (ref 0.50–1.35)
Glucose, Bld: 92 mg/dL (ref 70–99)
Sodium: 141 mEq/L (ref 135–145)

## 2012-12-16 LAB — CBC WITH DIFFERENTIAL/PLATELET
Eosinophils Relative: 1 % (ref 0–5)
HCT: 38.5 % — ABNORMAL LOW (ref 39.0–52.0)
Lymphocytes Relative: 23 % (ref 12–46)
Lymphs Abs: 1.9 10*3/uL (ref 0.7–4.0)
MCV: 88.3 fL (ref 78.0–100.0)
Monocytes Absolute: 1 10*3/uL (ref 0.1–1.0)
Monocytes Relative: 12 % (ref 3–12)
RBC: 4.36 MIL/uL (ref 4.22–5.81)
WBC: 8.3 10*3/uL (ref 4.0–10.5)

## 2012-12-16 LAB — PROTIME-INR: Prothrombin Time: 13.6 seconds (ref 11.6–15.2)

## 2012-12-16 LAB — URINALYSIS, ROUTINE W REFLEX MICROSCOPIC
Ketones, ur: NEGATIVE mg/dL
Leukocytes, UA: NEGATIVE
Nitrite: NEGATIVE
pH: 5.5 (ref 5.0–8.0)

## 2012-12-16 LAB — SAMPLE TO BLOOD BANK

## 2012-12-16 NOTE — Progress Notes (Signed)
Orthopedic Tech Progress Note Patient Details:  [name] 10/11/1980 [id number]  Patient ID: [name] [name], male   DOB: 1980-09-15, 32 y.o.   MRN: [id number] Made level 2 trauma visit  Nikki Dom 12/16/2012, 10:39 PM
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
## 2012-12-17 ENCOUNTER — Emergency Department (HOSPITAL_COMMUNITY): Payer: Self-pay

## 2012-12-17 MED ORDER — HYDROCODONE-ACETAMINOPHEN 5-325 MG PO TABS: 2.0000 | ORAL_TABLET | ORAL | Status: DC | PRN

## 2012-12-17 MED ORDER — IOHEXOL 300 MG/ML  SOLN
80.0000 mL | Freq: Once | INTRAMUSCULAR | Status: AC | PRN
  Administered 2012-12-17: 80 mL via INTRAVENOUS

## 2012-12-17 NOTE — ED Notes (Signed)
MD at bedside to complete sutures.

## 2012-12-20 ENCOUNTER — Emergency Department (HOSPITAL_COMMUNITY)
Admission: EM | Admit: 2012-12-20 | Discharge: 2012-12-21 | Disposition: A | Discharge status: Home / Self Care | Payer: Self-pay | Attending: Emergency Medicine | Admitting: Emergency Medicine

## 2012-12-20 ENCOUNTER — Emergency Department (HOSPITAL_COMMUNITY): Payer: Self-pay

## 2012-12-20 ENCOUNTER — Encounter (HOSPITAL_COMMUNITY): Payer: Self-pay

## 2012-12-20 DIAGNOSIS — Y9289 Other specified places as the place of occurrence of the external cause: Secondary | ICD-10-CM | POA: Insufficient documentation

## 2012-12-20 DIAGNOSIS — F3289 Other specified depressive episodes: Secondary | ICD-10-CM | POA: Insufficient documentation

## 2012-12-20 DIAGNOSIS — Z8679 Personal history of other diseases of the circulatory system: Secondary | ICD-10-CM | POA: Insufficient documentation

## 2012-12-20 DIAGNOSIS — F329 Major depressive disorder, single episode, unspecified: Secondary | ICD-10-CM | POA: Insufficient documentation

## 2012-12-20 DIAGNOSIS — S60222A Contusion of left hand, initial encounter: Secondary | ICD-10-CM

## 2012-12-20 DIAGNOSIS — Y939 Activity, unspecified: Secondary | ICD-10-CM | POA: Insufficient documentation

## 2012-12-20 DIAGNOSIS — Z79899 Other long term (current) drug therapy: Secondary | ICD-10-CM | POA: Insufficient documentation

## 2012-12-20 DIAGNOSIS — F489 Nonpsychotic mental disorder, unspecified: Secondary | ICD-10-CM | POA: Insufficient documentation

## 2012-12-20 DIAGNOSIS — J45909 Unspecified asthma, uncomplicated: Secondary | ICD-10-CM | POA: Insufficient documentation

## 2012-12-20 DIAGNOSIS — W230XXA Caught, crushed, jammed, or pinched between moving objects, initial encounter: Secondary | ICD-10-CM | POA: Insufficient documentation

## 2012-12-20 DIAGNOSIS — I10 Essential (primary) hypertension: Secondary | ICD-10-CM | POA: Insufficient documentation

## 2012-12-20 DIAGNOSIS — S60229A Contusion of unspecified hand, initial encounter: Secondary | ICD-10-CM | POA: Insufficient documentation

## 2012-12-20 DIAGNOSIS — F17200 Nicotine dependence, unspecified, uncomplicated: Secondary | ICD-10-CM | POA: Insufficient documentation
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
## 2012-12-21 NOTE — ED Provider Notes (Signed)
Medical screening examination/treatment/procedure(s) were performed by non-physician practitioner and as supervising physician I was immediately available for consultation/collaboration.    Nelia Shi, MD 12/21/12 [phone]

## 2012-12-21 NOTE — ED Notes (Signed)
Pt dc'd home w/all belongings, alert and ambulatory upon dc, pt verbalizes understanding of dc instructions, driven home by friend 

## 2012-12-23 ENCOUNTER — Emergency Department (HOSPITAL_COMMUNITY)
Admission: EM | Admit: 2012-12-23 | Discharge: 2012-12-23 | Disposition: A | Discharge status: Home / Self Care | Payer: Self-pay | Attending: Emergency Medicine | Admitting: Emergency Medicine

## 2012-12-23 ENCOUNTER — Encounter (HOSPITAL_COMMUNITY): Payer: Self-pay | Admitting: Emergency Medicine

## 2012-12-23 ENCOUNTER — Emergency Department (HOSPITAL_COMMUNITY): Payer: Self-pay

## 2012-12-23 DIAGNOSIS — J45909 Unspecified asthma, uncomplicated: Secondary | ICD-10-CM | POA: Insufficient documentation

## 2012-12-23 DIAGNOSIS — B9789 Other viral agents as the cause of diseases classified elsewhere: Secondary | ICD-10-CM | POA: Insufficient documentation

## 2012-12-23 DIAGNOSIS — Z8679 Personal history of other diseases of the circulatory system: Secondary | ICD-10-CM | POA: Insufficient documentation

## 2012-12-23 DIAGNOSIS — B349 Viral infection, unspecified: Secondary | ICD-10-CM

## 2012-12-23 DIAGNOSIS — T8140XA Infection following a procedure, unspecified, initial encounter: Secondary | ICD-10-CM | POA: Insufficient documentation

## 2012-12-23 DIAGNOSIS — I10 Essential (primary) hypertension: Secondary | ICD-10-CM | POA: Insufficient documentation

## 2012-12-23 DIAGNOSIS — R61 Generalized hyperhidrosis: Secondary | ICD-10-CM | POA: Insufficient documentation

## 2012-12-23 DIAGNOSIS — Y838 Other surgical procedures as the cause of abnormal reaction of the patient, or of later complication, without mention of misadventure at the time of the procedure: Secondary | ICD-10-CM | POA: Insufficient documentation

## 2012-12-23 DIAGNOSIS — T798XXA Other early complications of trauma, initial encounter: Secondary | ICD-10-CM

## 2012-12-23 DIAGNOSIS — F319 Bipolar disorder, unspecified: Secondary | ICD-10-CM | POA: Insufficient documentation

## 2012-12-23 DIAGNOSIS — F17200 Nicotine dependence, unspecified, uncomplicated: Secondary | ICD-10-CM | POA: Insufficient documentation

## 2012-12-23 DIAGNOSIS — Z8659 Personal history of other mental and behavioral disorders: Secondary | ICD-10-CM | POA: Insufficient documentation

## 2012-12-23 DIAGNOSIS — Z79899 Other long term (current) drug therapy: Secondary | ICD-10-CM | POA: Insufficient documentation

## 2012-12-23 LAB — CBC
HCT: 40.6 % (ref 39.0–52.0)
MCHC: 33.3 g/dL (ref 30.0–36.0)
MCV: 90.6 fL (ref 78.0–100.0)
RDW: 13.4 % (ref 11.5–15.5)

## 2012-12-23 LAB — URINALYSIS, ROUTINE W REFLEX MICROSCOPIC
Glucose, UA: NEGATIVE mg/dL
Hgb urine dipstick: NEGATIVE
Ketones, ur: NEGATIVE mg/dL
Protein, ur: NEGATIVE mg/dL
pH: 6 (ref 5.0–8.0)

## 2012-12-23 LAB — POCT I-STAT, CHEM 8
BUN: 10 mg/dL (ref 6–23)
Calcium, Ion: 1.18 mmol/L (ref 1.12–1.23)
Chloride: 103 mEq/L (ref 96–112)
Creatinine, Ser: 0.9 mg/dL (ref 0.50–1.35)
Sodium: 141 mEq/L (ref 135–145)
TCO2: 24 mmol/L (ref 0–100)

## 2012-12-23 MED ORDER — CEPHALEXIN 500 MG PO CAPS: 500.0000 mg | ORAL_CAPSULE | Freq: Four times a day (QID) | ORAL | Status: DC

## 2012-12-23 MED ORDER — SODIUM CHLORIDE 0.9 % IV SOLN: INTRAVENOUS | Status: DC

## 2012-12-23 MED ORDER — SODIUM CHLORIDE 0.9 % IV BOLUS (SEPSIS)
1000.0000 mL | Freq: Once | INTRAVENOUS | Status: AC
  Administered 2012-12-23: 1000 mL via INTRAVENOUS

## 2012-12-23 MED ORDER — KETOROLAC TROMETHAMINE 30 MG/ML IJ SOLN
30.0000 mg | Freq: Once | INTRAMUSCULAR | Status: AC
  Administered 2012-12-23: 30 mg via INTRAVENOUS
  Filled 2012-12-23: qty 1

## 2012-12-23 MED ORDER — ONDANSETRON HCL 4 MG/2ML IJ SOLN
4.0000 mg | Freq: Once | INTRAMUSCULAR | Status: AC
Start: 2012-12-23 — End: 2012-12-23
  Administered 2012-12-23: 4 mg via INTRAVENOUS
  Filled 2012-12-23: qty 2

## 2012-12-23 NOTE — ED Notes (Signed)
Pt presenting to ed with c/o generalized weakness, lightheadedness, and coughing up a small amount of blood. Pt states he has also been diaphoretic. Pt denies chest pain at this time. Pt also with concern of possible infection at his two suture sites left forearm and right chest area.

## 2012-12-23 NOTE — ED Notes (Signed)
Pt is aware that a urine sample is needed.  

## 2012-12-23 NOTE — ED Provider Notes (Signed)
History    CSN: [id number] Arrival date & time 12/23/12  1201  First MD Initiated Contact with Patient 12/23/12 1210     Chief Complaint  Patient presents with  . generalized weakness   . possible infected sutures    (Consider location/radiation/quality/duration/timing/severity/associated sxs/prior Treatment) The history is provided by the patient.   patient here complaining of one-day history of cough with generalized weakness and being lightheaded. He also notes mild diaphoresis and low-grade temperature. No chest pain chest pressure. Does have pain to his left forearm where he he had a laceration is repaired. Denies any dysuria or hematuria. No rashes noted. No neck pain or photophobia. Symptoms have been persistent and no treatment used prior to arrival. Nothing makes them better. Denies any sore throat ear pain. Denies any abdominal pain. Past Medical History  Diagnosis Date  . Mental disorder   . Hypertension   . Depression   . Bipolar 1 disorder   . Irregular heart rate   . Asthma    History reviewed. No pertinent past surgical history. Family History  Problem Relation Age of Onset  . Hypertension Mother   . Hypertension Other    History  Substance Use Topics  . Smoking status: Current Every Day Smoker -- 0.50 packs/day  . Smokeless tobacco: Never Used  . Alcohol Use: 0.0 oz/week     Comment: occ.    Review of Systems  All other systems reviewed and are negative.    Allergies  Review of patient's allergies indicates no known allergies.  Home Medications   Current Outpatient Rx  Name  Route  Sig  Dispense  Refill  . acetaminophen (TYLENOL) 500 MG tablet   Oral   Take 1,000 mg by mouth 2 (two) times daily as needed for pain.         Marland Kitchen albuterol (PROVENTIL HFA;VENTOLIN HFA) 108 (90 BASE) MCG/ACT inhaler   Inhalation   Inhale 2 puffs into the lungs 2 (two) times daily.         . DULoxetine (CYMBALTA) 60 MG capsule   Oral   Take 1 capsule (60 mg  total) by mouth daily. For depression   30 capsule   0   . gabapentin (NEURONTIN) 300 MG capsule   Oral   Take 1 capsule (300 mg total) by mouth 3 (three) times daily. For mood stability   90 capsule   0   . PARoxetine (PAXIL) 20 MG tablet   Oral   Take 1 tablet (20 mg total) by mouth daily. For anxiety   30 tablet   0   . QUEtiapine (SEROQUEL) 100 MG tablet   Oral   Take 100 mg by mouth at bedtime.         . traZODone (DESYREL) 100 MG tablet   Oral   Take 100 mg by mouth at bedtime.          BP 119/71  Pulse 84  Temp(Src) 99.9 F (37.7 C) (Oral)  Resp 20  SpO2 97% Physical Exam  Nursing note and vitals reviewed. Constitutional: He is oriented to person, place, and time. He appears well-developed and well-nourished.  Non-toxic appearance. No distress.  HENT:  Head: Normocephalic and atraumatic.  Eyes: Conjunctivae, EOM and lids are normal. Pupils are equal, round, and reactive to light.  Neck: Normal range of motion. Neck supple. No tracheal deviation present. No Brudzinski's sign noted. No mass present.  Cardiovascular: Normal rate, regular rhythm and normal heart sounds.  Exam reveals no  gallop.   No murmur heard. Pulmonary/Chest: Effort normal and breath sounds normal. No stridor. No respiratory distress. He has no decreased breath sounds. He has no wheezes. He has no rhonchi. He has no rales.  Abdominal: Soft. Normal appearance and bowel sounds are normal. He exhibits no distension. There is no tenderness. There is no rebound and no CVA tenderness.  Musculoskeletal: Normal range of motion. He exhibits no edema and no tenderness.       Arms: Neurological: He is alert and oriented to person, place, and time. He has normal strength. No cranial nerve deficit or sensory deficit. GCS eye subscore is 4. GCS verbal subscore is 5. GCS motor subscore is 6.  Skin: Skin is warm and dry. No abrasion and no rash noted.  Psychiatric: He has a normal mood and affect. His speech  is normal and behavior is normal.    ED Course  Procedures (including critical care time) Labs Reviewed  GLUCOSE, CAPILLARY  CBC  URINALYSIS, ROUTINE W REFLEX MICROSCOPIC   No results found. No diagnosis found.  MDM  Patient given IV fluids and Toradol and feels better. Patient will be treated for possible early infection of the sutured wound on the left. Sutures will be removed and replaced with Steri-Strips. Will place patient on Keflex  Toy Baker, MD 12/23/12 1521

## 2012-12-23 NOTE — ED Notes (Signed)
Pt reports nausea and vomiting since yesterday.  Pt reports vomiting twice today.  Pt reports pain in his abd only when he coughs.

## 2012-12-23 NOTE — ED Notes (Signed)
BUS PASS GIVEN TO PT

## 2012-12-23 NOTE — ED Notes (Signed)
MD at bedside. 

## 2012-12-23 NOTE — ED Notes (Signed)
Pt resting with eyes closed, symmetrical rise and fall of chest noted.

## 2012-12-23 NOTE — Progress Notes (Signed)
[facility] spoke with patient and provided him with a [employer] app, highlighting [facility] [place].

## 2013-01-07 ENCOUNTER — Emergency Department (HOSPITAL_COMMUNITY): Payer: Self-pay

## 2013-01-07 ENCOUNTER — Encounter (HOSPITAL_COMMUNITY): Payer: Self-pay | Admitting: Emergency Medicine

## 2013-01-07 ENCOUNTER — Emergency Department (HOSPITAL_COMMUNITY)
Admission: EM | Admit: 2013-01-07 | Discharge: 2013-01-11 | Disposition: A | Discharge status: Other Acute Inpatient Hospital | Payer: Self-pay | Attending: Emergency Medicine | Admitting: Emergency Medicine

## 2013-01-07 DIAGNOSIS — F29 Unspecified psychosis not due to a substance or known physiological condition: Secondary | ICD-10-CM

## 2013-01-07 DIAGNOSIS — S8990XA Unspecified injury of unspecified lower leg, initial encounter: Secondary | ICD-10-CM | POA: Insufficient documentation

## 2013-01-07 DIAGNOSIS — Y9389 Activity, other specified: Secondary | ICD-10-CM | POA: Insufficient documentation

## 2013-01-07 DIAGNOSIS — F319 Bipolar disorder, unspecified: Secondary | ICD-10-CM | POA: Insufficient documentation

## 2013-01-07 DIAGNOSIS — I10 Essential (primary) hypertension: Secondary | ICD-10-CM | POA: Insufficient documentation

## 2013-01-07 DIAGNOSIS — F17200 Nicotine dependence, unspecified, uncomplicated: Secondary | ICD-10-CM | POA: Insufficient documentation

## 2013-01-07 DIAGNOSIS — Y929 Unspecified place or not applicable: Secondary | ICD-10-CM | POA: Insufficient documentation

## 2013-01-07 DIAGNOSIS — X500XXA Overexertion from strenuous movement or load, initial encounter: Secondary | ICD-10-CM | POA: Insufficient documentation

## 2013-01-07 DIAGNOSIS — J45909 Unspecified asthma, uncomplicated: Secondary | ICD-10-CM | POA: Insufficient documentation

## 2013-01-07 DIAGNOSIS — Z79899 Other long term (current) drug therapy: Secondary | ICD-10-CM | POA: Insufficient documentation

## 2013-01-07 DIAGNOSIS — R443 Hallucinations, unspecified: Secondary | ICD-10-CM | POA: Insufficient documentation

## 2013-01-07 DIAGNOSIS — Z8679 Personal history of other diseases of the circulatory system: Secondary | ICD-10-CM | POA: Insufficient documentation

## 2013-01-07 DIAGNOSIS — R45851 Suicidal ideations: Secondary | ICD-10-CM | POA: Insufficient documentation

## 2013-01-07 LAB — COMPREHENSIVE METABOLIC PANEL
ALT: 21 U/L (ref 0–53)
AST: 22 U/L (ref 0–37)
Albumin: 3.8 g/dL (ref 3.5–5.2)
Alkaline Phosphatase: 101 U/L (ref 39–117)
Calcium: 9.6 mg/dL (ref 8.4–10.5)
GFR calc Af Amer: >90 mL/min (ref >90)
Glucose, Bld: 104 mg/dL — ABNORMAL HIGH (ref 70–99)
Potassium: 3.7 mEq/L (ref 3.5–5.1)
Sodium: 140 mEq/L (ref 135–145)
Total Protein: 7.1 g/dL (ref 6.0–8.3)

## 2013-01-07 LAB — CBC WITH DIFFERENTIAL/PLATELET
Basophils Absolute: 0 10*3/uL (ref 0.0–0.1)
Basophils Relative: 0 % (ref 0–1)
Eosinophils Absolute: 0.1 10*3/uL (ref 0.0–0.7)
Eosinophils Relative: 1 % (ref 0–5)
Lymphs Abs: 2 10*3/uL (ref 0.7–4.0)
MCH: 31 pg (ref 26.0–34.0)
MCV: 89 fL (ref 78.0–100.0)
Neutrophils Relative %: 79 % — ABNORMAL HIGH (ref 43–77)
Platelets: 328 10*3/uL (ref 150–400)
RBC: 4.74 MIL/uL (ref 4.22–5.81)
RDW: 13.4 % (ref 11.5–15.5)

## 2013-01-07 LAB — RAPID URINE DRUG SCREEN, HOSP PERFORMED
Amphetamines: NOT DETECTED
Benzodiazepines: NOT DETECTED
Opiates: NOT DETECTED
Tetrahydrocannabinol: NOT DETECTED

## 2013-01-07 MED ORDER — ACETAMINOPHEN 325 MG PO TABS: 650.0000 mg | ORAL_TABLET | ORAL | Status: DC | PRN

## 2013-01-07 MED ORDER — NICOTINE 21 MG/24HR TD PT24
21.0000 mg | MEDICATED_PATCH | Freq: Every day | TRANSDERMAL | Status: DC
  Administered 2013-01-08 – 2013-01-11 (×3): 21 mg via TRANSDERMAL
  Filled 2013-01-07 (×4): qty 1

## 2013-01-07 MED ORDER — IBUPROFEN 400 MG PO TABS: 600.0000 mg | ORAL_TABLET | Freq: Three times a day (TID) | ORAL | Status: DC | PRN
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
## 2013-01-07 NOTE — ED Notes (Signed)
I gave the patient a happy meal, a cup of ice, and a ginger-ale. 

## 2013-01-07 NOTE — ED Provider Notes (Signed)
CSN: [id number]     Arrival date & time 01/07/13  2147 History     First MD Initiated Contact with Patient 01/07/13 2234     Chief Complaint  Patient presents with  . Suicidal  . Hallucinations   (Consider location/radiation/quality/duration/timing/severity/associated sxs/prior Treatment) The history is provided by the patient.  pt with hx bipolar disorder/psychosis, c/o feeling worried, paranoid thoughts, auditory hallucinations in the past week. Pt states he is taking all of his normal medications on 'an as needed basis'.  w above symptoms, pt denies specific exacerbating or alleviating factors. Pt states at times feels depressed. Denies thoughts of self harm or harm to others. No od or attempt to hurt self. Pt a/so states stepped in hole yesterday, twisting left ankle.  Mild dull pain to ankle. Is ambulatory. No numbness/weakness. Pt denies any other recent problems w physical health. No other injury.     Past Medical History  Diagnosis Date  . Mental disorder   . Hypertension   . Depression   . Bipolar 1 disorder   . Irregular heart rate   . Asthma    History reviewed. No pertinent past surgical history. Family History  Problem Relation Age of Onset  . Hypertension Mother   . Hypertension Other    History  Substance Use Topics  . Smoking status: Current Every Day Smoker -- 0.50 packs/day  . Smokeless tobacco: Never Used  . Alcohol Use: 0.0 oz/week     Comment: occ.    Review of Systems  Constitutional: Negative for fever and chills.  HENT: Negative for neck pain.   Eyes: Negative for visual disturbance.  Respiratory: Negative for cough and shortness of breath.   Cardiovascular: Negative for chest pain.  Gastrointestinal: Negative for abdominal pain.  Genitourinary: Negative for flank pain.  Musculoskeletal: Negative for back pain.  Skin: Negative for rash.  Neurological: Negative for headaches.  Hematological: Does not bruise/bleed easily.   Psychiatric/Behavioral: Positive for hallucinations and dysphoric mood.    Allergies  Review of patient's allergies indicates no known allergies.  Home Medications   Current Outpatient Rx  Name  Route  Sig  Dispense  Refill  . acetaminophen (TYLENOL) 500 MG tablet   Oral   Take 1,000 mg by mouth 2 (two) times daily as needed for pain.         Marland Kitchen albuterol (PROVENTIL HFA;VENTOLIN HFA) 108 (90 BASE) MCG/ACT inhaler   Inhalation   Inhale 2 puffs into the lungs 2 (two) times daily as needed for wheezing or shortness of breath.          . DULoxetine (CYMBALTA) 60 MG capsule   Oral   Take 60 mg by mouth daily.         Marland Kitchen gabapentin (NEURONTIN) 300 MG capsule   Oral   Take 300 mg by mouth 3 (three) times daily.         Marland Kitchen PARoxetine (PAXIL) 20 MG tablet   Oral   Take 20 mg by mouth every morning.         Marland Kitchen QUEtiapine (SEROQUEL) 100 MG tablet   Oral   Take 100 mg by mouth at bedtime.         . traZODone (DESYREL) 100 MG tablet   Oral   Take 100 mg by mouth at bedtime.          BP 130/79  Pulse 103  Temp(Src) 99.4 F (37.4 C) (Oral)  Resp 14  SpO2 99% Physical Exam  Nursing note  and vitals reviewed. Constitutional: He is oriented to person, place, and time. He appears well-developed and well-nourished. No distress.  HENT:  Head: Atraumatic.  Mouth/Throat: Oropharynx is clear and moist.  Eyes: Conjunctivae are normal. Pupils are equal, round, and reactive to light. No scleral icterus.  Neck: Neck supple. No tracheal deviation present.  Cardiovascular: Normal rate, regular rhythm, normal heart sounds and intact distal pulses.   Pulmonary/Chest: Effort normal and breath sounds normal. No accessory muscle usage. No respiratory distress.  Abdominal: Soft. Bowel sounds are normal. He exhibits no distension. There is no tenderness.  Musculoskeletal: Normal range of motion. He exhibits no edema.  Tenderness lat malleolus left ankle. Ankle stable. Distal pulses  palp. No 5th mt tenderness, or other focal bony tenderness. Good rom bil ext.   Neurological: He is alert and oriented to person, place, and time.  Motor intact bil. Steady gait.  Skin: Skin is warm and dry. He is not diaphoretic.  Psychiatric:  Pt moves from one unrelated thought to next. States intermittent auditory hallucinations, none currently. Denies SI/HI.     ED Course   Procedures (including critical care time)  Results for orders placed during the hospital encounter of 01/07/13  ETHANOL      Result Value Range   Alcohol, Ethyl (B) <11  0 - 11 mg/dL  URINE RAPID DRUG SCREEN (HOSP PERFORMED)      Result Value Range   Opiates NONE DETECTED  NONE DETECTED   Cocaine NONE DETECTED  NONE DETECTED   Benzodiazepines NONE DETECTED  NONE DETECTED   Amphetamines NONE DETECTED  NONE DETECTED   Tetrahydrocannabinol NONE DETECTED  NONE DETECTED   Barbiturates NONE DETECTED  NONE DETECTED  CBC WITH DIFFERENTIAL      Result Value Range   WBC 13.7 (*) 4.0 - 10.5 K/uL   RBC 4.74  4.22 - 5.81 MIL/uL   Hemoglobin 14.7  13.0 - 17.0 g/dL   HCT 16.1  09.6 - 04.5 %   MCV 89.0  78.0 - 100.0 fL   MCH 31.0  26.0 - 34.0 pg   MCHC 34.8  30.0 - 36.0 g/dL   RDW 40.9  81.1 - 91.4 %   Platelets 328  150 - 400 K/uL   Neutrophils Relative % 79 (*) 43 - 77 %   Neutro Abs 10.7 (*) 1.7 - 7.7 K/uL   Lymphocytes Relative 14  12 - 46 %   Lymphs Abs 2.0  0.7 - 4.0 K/uL   Monocytes Relative 6  3 - 12 %   Monocytes Absolute 0.9  0.1 - 1.0 K/uL   Eosinophils Relative 1  0 - 5 %   Eosinophils Absolute 0.1  0.0 - 0.7 K/uL   Basophils Relative 0  0 - 1 %   Basophils Absolute 0.0  0.0 - 0.1 K/uL  COMPREHENSIVE METABOLIC PANEL      Result Value Range   Sodium 140  135 - 145 mEq/L   Potassium 3.7  3.5 - 5.1 mEq/L   Chloride 106  96 - 112 mEq/L   CO2 24  19 - 32 mEq/L   Glucose, Bld 104 (*) 70 - 99 mg/dL   BUN 16  6 - 23 mg/dL   Creatinine, Ser 7.82  0.50 - 1.35 mg/dL   Calcium 9.6  8.4 - 95.6 mg/dL    Total Protein 7.1  6.0 - 8.3 g/dL   Albumin 3.8  3.5 - 5.2 g/dL   AST 22  0 - 37 U/L  ALT 21  0 - 53 U/L   Alkaline Phosphatase 101  39 - 117 U/L   Total Bilirubin 0.8  0.3 - 1.2 mg/dL   GFR calc non Af Amer >90  >90 mL/min   GFR calc Af Amer >90  >90 mL/min   Dg Chest 2 View  Dg Ankle Complete Left  01/07/2013   *RADIOLOGY REPORT*  Clinical Data: Fall.  Left ankle pain.  LEFT ANKLE COMPLETE - 3+ VIEW  Comparison: None.  Findings: No acute bony or joint abnormality is identified.  Small dorsal calcaneal spur noted.  No tibiotalar joint effusion.  IMPRESSION: No acute abnormality.   Original Report Authenticated By: Holley Dexter, M.D.    MDM  Labs.  Act team consulted.  Ankle xray neg, ankle stable, exam c/w sprain. aso brace.   Signed out to oncoming provider, Dr Fonnie Jarvis, to follow up with act/psych team assessment, recheck pt, and dispo appropriately.   Reviewed nursing notes and prior charts for additional history.       Suzi Roots, MD 01/07/13 2330

## 2013-01-07 NOTE — ED Notes (Signed)
ACT team called to initiate telepsych process

## 2013-01-08 MED ORDER — TRAZODONE HCL 50 MG PO TABS
100.0000 mg | ORAL_TABLET | Freq: Every day | ORAL | Status: DC
  Administered 2013-01-08 – 2013-01-10 (×4): 100 mg via ORAL
  Filled 2013-01-08 (×4): qty 2

## 2013-01-08 MED ORDER — ALBUTEROL SULFATE HFA 108 (90 BASE) MCG/ACT IN AERS: 2.0000 | INHALATION_SPRAY | Freq: Two times a day (BID) | RESPIRATORY_TRACT | Status: DC | PRN

## 2013-01-08 MED ORDER — GABAPENTIN 300 MG PO CAPS
300.0000 mg | ORAL_CAPSULE | Freq: Three times a day (TID) | ORAL | Status: DC
  Administered 2013-01-08 – 2013-01-11 (×12): 300 mg via ORAL
  Filled 2013-01-08 (×12): qty 1

## 2013-01-08 MED ORDER — PAROXETINE HCL 20 MG PO TABS
20.0000 mg | ORAL_TABLET | ORAL | Status: DC
  Administered 2013-01-08 – 2013-01-11 (×4): 20 mg via ORAL
  Filled 2013-01-08 (×6): qty 1

## 2013-01-08 MED ORDER — DULOXETINE HCL 60 MG PO CPEP
60.0000 mg | ORAL_CAPSULE | Freq: Every day | ORAL | Status: DC
  Administered 2013-01-08 – 2013-01-11 (×4): 60 mg via ORAL
  Filled 2013-01-08 (×4): qty 1

## 2013-01-08 MED ORDER — QUETIAPINE FUMARATE 25 MG PO TABS
100.0000 mg | ORAL_TABLET | Freq: Every day | ORAL | Status: DC
  Administered 2013-01-08 – 2013-01-10 (×4): 100 mg via ORAL
  Filled 2013-01-08: qty 4
  Filled 2013-01-08: qty 1
  Filled 2013-01-08: qty 4
  Filled 2013-01-08: qty 3
  Filled 2013-01-08: qty 4
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
## 2013-01-09 NOTE — ED Notes (Signed)
[facility] called to update staff that patient has been excepted there and they are just waiting for a bed to become available. Stated it will probably be sometime tomorrow.

## 2013-01-09 NOTE — ED Notes (Signed)
Called [facility] to follow up on status of patient being transferred. [facility] unaware of patient. Christine made aware.

## 2013-01-09 NOTE — BH Assessment (Addendum)
[facility] Assessment Progress Note  Update @ 1800:  Called Dr. Dub Mikes to consider pt for [facility].  Pt accepted by Dr. Dub Mikes pending a 400 Hall bed @ 1800.  Wilfrid Lund, his nurse @ [facility] @ 1800 to inform her.  Called [facility] to follow up on pt referral and per [facility], pt declined due to acuity.  Called [facility] and per [facility], and no referral found.  Faxed again for review, as she stated beds available.  Called [facility] and per [facility], he would be self-pay as no contract with [facility].  Called [facility] and pt still under review per [facility] @ 1814.  Oncoming staff will need to follow up.    Called [facility] and no beds per [place] @ 1104.  Called [facility] and may  Have beds later per [facility] @ 1105.  Referral faxed for review.  Called [facility] and beds available per [place] @ 1106.  Referral faxed for review.  Called [facility] and per [place], may have beds @ 1107.  Referral faxed for review.  Called [facility] and no beds per [employer], but can send referral for consideration for their wait list.  Referral faxed for review.

## 2013-01-09 NOTE — ED Provider Notes (Signed)
On AM rounds the patient was asleep.  Per RN, no acute events overnight.  Dispo pending.  Gerhard Munch, MD 01/09/13 1319

## 2013-01-09 NOTE — Progress Notes (Signed)
Orthopedic Tech Progress Note Patient Details:  [name] [name] 07/25/1980 [id number] Order one day old ortho never called Patient ID: [name], male   DOB: 1981/01/13, 32 y.o.   MRN: [id number]   Jennye Moccasin 01/09/2013, 6:09 PM

## 2013-01-10 MED ORDER — DIPHENHYDRAMINE HCL 25 MG PO CAPS: 50.0000 mg | ORAL_CAPSULE | Freq: Four times a day (QID) | ORAL | Status: DC | PRN

## 2013-01-10 NOTE — ED Provider Notes (Signed)
Patient sleeping this morning. No overnight complaints. Continue to pursue placement.  Filed Vitals:   01/10/13 0519  BP: 94/61  Pulse: 60  Temp: 97.1 F (36.2 C)  Resp: 20     Gilda Crease, MD 01/10/13 [phone]

## 2013-01-10 NOTE — ED Notes (Signed)
Breakfast tray delivered

## 2013-01-10 NOTE — ED Provider Notes (Signed)
Filed Vitals:   01/10/13 1125  BP: 112/65  Pulse: 80  Temp: 98.2 F (36.8 C)  Resp: 20   Called to the patient's room to evaluate a rash. Patient reports that he has an itchy rash on his right hip that he noticed after he showered. He does have a patchy, raised erythematous urticarial type rash on the right hip and right side of his lower abdomen. No vesicles. Patient is breathing comfortably, and lungs clear. No obvious trigger for this rash. Continue his routine medications can have Benadryl as needed for rash.  Gilda Crease, MD 01/10/13 [phone]

## 2013-01-10 NOTE — ED Notes (Signed)
Report received, 1st contact with patient and will assume care of pt at this time.

## 2013-01-10 NOTE — ED Notes (Signed)
Ed tech in to apply aso splint to left ankle. Pt complimented that his ankle feels so much better

## 2013-01-11 ENCOUNTER — Inpatient Hospital Stay (HOSPITAL_COMMUNITY)
Admission: AD | Admit: 2013-01-11 | Discharge: 2013-01-16 | DRG: 897 | Disposition: A | Discharge status: Home / Self Care | Payer: No Typology Code available for payment source | Source: Intra-hospital | Attending: Psychiatry | Admitting: Psychiatry

## 2013-01-11 ENCOUNTER — Encounter (HOSPITAL_COMMUNITY): Payer: Self-pay | Admitting: Emergency Medicine

## 2013-01-11 DIAGNOSIS — F609 Personality disorder, unspecified: Secondary | ICD-10-CM | POA: Diagnosis present

## 2013-01-11 DIAGNOSIS — F1020 Alcohol dependence, uncomplicated: Principal | ICD-10-CM | POA: Diagnosis present

## 2013-01-11 DIAGNOSIS — F323 Major depressive disorder, single episode, severe with psychotic features: Secondary | ICD-10-CM

## 2013-01-11 DIAGNOSIS — Z79899 Other long term (current) drug therapy: Secondary | ICD-10-CM

## 2013-01-11 DIAGNOSIS — Z634 Disappearance and death of family member: Secondary | ICD-10-CM

## 2013-01-11 DIAGNOSIS — F319 Bipolar disorder, unspecified: Secondary | ICD-10-CM | POA: Diagnosis present

## 2013-01-11 DIAGNOSIS — J45909 Unspecified asthma, uncomplicated: Secondary | ICD-10-CM | POA: Diagnosis present

## 2013-01-11 DIAGNOSIS — F329 Major depressive disorder, single episode, unspecified: Secondary | ICD-10-CM

## 2013-01-11 DIAGNOSIS — F1010 Alcohol abuse, uncomplicated: Secondary | ICD-10-CM | POA: Diagnosis present

## 2013-01-11 DIAGNOSIS — I10 Essential (primary) hypertension: Secondary | ICD-10-CM

## 2013-01-11 MED ORDER — CHLORDIAZEPOXIDE HCL 25 MG PO CAPS: 25.0000 mg | ORAL_CAPSULE | Freq: Four times a day (QID) | ORAL | Status: AC | PRN

## 2013-01-11 MED ORDER — ALUM & MAG HYDROXIDE-SIMETH 200-200-20 MG/5ML PO SUSP: 30.0000 mL | ORAL | Status: DC | PRN

## 2013-01-11 MED ORDER — ADULT MULTIVITAMIN W/MINERALS CH
1.0000 | ORAL_TABLET | Freq: Every day | ORAL | Status: DC
  Administered 2013-01-12 – 2013-01-16 (×5): 1 via ORAL
  Filled 2013-01-11 (×7): qty 1

## 2013-01-11 MED ORDER — ONDANSETRON 4 MG PO TBDP: 4.0000 mg | ORAL_TABLET | Freq: Four times a day (QID) | ORAL | Status: AC | PRN

## 2013-01-11 MED ORDER — VITAMIN B-1 100 MG PO TABS
100.0000 mg | ORAL_TABLET | Freq: Every day | ORAL | Status: DC
  Administered 2013-01-12 – 2013-01-16 (×5): 100 mg via ORAL
  Filled 2013-01-11 (×7): qty 1

## 2013-01-11 MED ORDER — LOPERAMIDE HCL 2 MG PO CAPS: 2.0000 mg | ORAL_CAPSULE | ORAL | Status: AC | PRN

## 2013-01-11 MED ORDER — THIAMINE HCL 100 MG/ML IJ SOLN: 100.0000 mg | Freq: Once | INTRAMUSCULAR | Status: DC

## 2013-01-11 MED ORDER — ACETAMINOPHEN 325 MG PO TABS
650.0000 mg | ORAL_TABLET | Freq: Four times a day (QID) | ORAL | Status: DC | PRN
  Administered 2013-01-12 – 2013-01-13 (×3): 650 mg via ORAL

## 2013-01-11 MED ORDER — HYDROXYZINE HCL 25 MG PO TABS: 25.0000 mg | ORAL_TABLET | Freq: Four times a day (QID) | ORAL | Status: AC | PRN

## 2013-01-11 MED ORDER — MAGNESIUM HYDROXIDE 400 MG/5ML PO SUSP: 30.0000 mL | Freq: Every day | ORAL | Status: DC | PRN

## 2013-01-11 NOTE — BH Assessment (Signed)
Tele Assessment Note   [name] [name] is an 32 y.o. male, single, Caucasian who presented to [facility] on 01/07/2013 reporting auditory hallucinations telling him to kill himself by jumping from a bridge or cutting his wrists. Pt has a history of bipolar disorder and states he was scheduled to see [facility] for medication management but he missed his appointment. He reported he had not been eating and had decreased sleep. He states that he recently lost his job. He also reports that he has been increasingly irritable and had been getting into fights with people. Pt was pending placement at an appropriate facility.  Today Pt reports he continues to feel suicidal and still has thoughts of wanting to jump off a bridge. He says he cannot be safe outside a hospital. He denies current auditory hallucinations but says they "come and go." He denies current homicidal ideation. He states he has been eating and sleeping while in the hospital.  Axis I: 296.54 Bipolar I Disorder, Most Recent Episode Depressed, Severe With Psychotic Features Axis II: Deferred Axis III:  Past Medical History  Diagnosis Date  . Mental disorder   . Hypertension   . Depression   . Bipolar 1 disorder   . Irregular heart rate   . Asthma    Axis IV: economic problems, housing problems, occupational problems, other psychosocial or environmental problems and problems with primary support group Axis V: GAF=30  Past Medical History:  Past Medical History  Diagnosis Date  . Mental disorder   . Hypertension   . Depression   . Bipolar 1 disorder   . Irregular heart rate   . Asthma     History reviewed. No pertinent past surgical history.  Family History:  Family History  Problem Relation Age of Onset  . Hypertension Mother   . Hypertension Other     Social History:  reports that he has been smoking.  He has never used smokeless tobacco. He reports that  drinks alcohol. He reports that he does not use illicit  drugs.  Additional Social History:  Alcohol / Drug Use Pain Medications: Denies abuse Prescriptions: Denies abuse Over the Counter: Denies abuse History of alcohol / drug use?: No history of alcohol / drug abuse (Pt reports occasional alcohol use) Longest period of sobriety (when/how long): NA  CIWA: CIWA-Ar BP: 137/81 mmHg Pulse Rate: 88 COWS:    Allergies: No Known Allergies  Home Medications:  (Not in a hospital admission)  OB/GYN Status:  No LMP for male patient.  General Assessment Data Location of Assessment: [facility] Is this a Tele or Face-to-Face Assessment?: Tele Assessment Is this an Initial Assessment or a Re-assessment for this encounter?: Re-Assessment Living Arrangements: Non-relatives/Friends Can pt return to current living arrangement?: Yes Admission Status: Voluntary Is patient capable of signing voluntary admission?: Yes Transfer from: Acute Hospital Referral Source: Self/Family/Friend  Education Status Is patient currently in school?: No Current Grade: NA Highest grade of school patient has completed: 5th Name of school: NA Contact person: NA  Risk to self Suicidal Ideation: Yes-Currently Present Suicidal Intent: Yes-Currently Present Is patient at risk for suicide?: Yes Suicidal Plan?: Yes-Currently Present Specify Current Suicidal Plan: Reports thoughts of jumping off a bridge Access to Means: Yes Specify Access to Suicidal Means: access to bridges, traffic, pocket knife, access to meds  What has been your use of drugs/alcohol within the last 12 months?: Pt reports occasional alcohol use, denies abuse Previous Attempts/Gestures: Yes How many times?: 2 Other Self Harm Risks:  Hx of cutting wrist, jumping in front of car. Triggers for Past Attempts: None known Intentional Self Injurious Behavior: Cutting Comment - Self Injurious Behavior: cutting in past not recent Family Suicide History: No Recent stressful life event(s):  Conflict (Comment) (conflict with friends at work and room mate) Persecutory voices/beliefs?: No Depression: Yes Depression Symptoms: Despondent;Tearfulness;Isolating;Fatigue;Guilt;Loss of interest in usual pleasures;Feeling worthless/self pity;Feeling angry/irritable Substance abuse history and/or treatment for substance abuse?: No Suicide prevention information given to non-admitted patients: Not applicable  Risk to Others Homicidal Ideation: No Thoughts of Harm to Others: No Comment - Thoughts of Harm to Others: Has thought of hurting friend but denies current HI Current Homicidal Intent: No Current Homicidal Plan: No Describe Current Homicidal Plan: Pt denies current plan Access to Homicidal Means: Yes Describe Access to Homicidal Means: Pt has access to knife Identified Victim: None History of harm to others?: Yes Assessment of Violence: On admission Violent Behavior Description: Pt reports fight with friend before admission Does patient have access to weapons?: Yes (Comment) Criminal Charges Pending?: No Does patient have a court date: No  Psychosis Hallucinations: Auditory (Reports ongoing auditory hallucinations) Delusions: None noted  Mental Status Report Appear/Hygiene: Disheveled Eye Contact: Good Motor Activity: Unremarkable Speech: Logical/coherent Level of Consciousness: Alert Mood: Depressed Affect: Depressed Anxiety Level: None Thought Processes: Coherent;Relevant Judgement: Impaired Orientation: Person;Place;Time;Situation Obsessive Compulsive Thoughts/Behaviors: None  Cognitive Functioning Concentration: Normal Memory: Recent Intact;Remote Intact IQ: Average Insight: Fair Impulse Control: Fair Appetite: Fair Weight Loss: 0 Weight Gain: 0 Sleep: Decreased Total Hours of Sleep: 6 Vegetative Symptoms: None  ADLScreening [facility]) Patient's cognitive ability adequate to safely complete daily activities?: Yes Patient able to  express need for assistance with ADLs?: Yes Independently performs ADLs?: Yes (appropriate for developmental age)  Prior Inpatient Therapy Prior Inpatient Therapy: Yes Prior Therapy Dates: 12/2012; multiple admits Prior Therapy Facilty/Provider(s): [facility] Reason for Treatment: Bipolar Disorder  Prior Outpatient Therapy Prior Outpatient Therapy: Yes Prior Therapy Dates: One year to current Prior Therapy Facilty/Provider(s): [facility] Reason for Treatment: med management  ADL Screening (condition at time of admission) Patient's cognitive ability adequate to safely complete daily activities?: Yes Is the patient deaf or have difficulty hearing?: No Does the patient have difficulty seeing, even when wearing glasses/contacts?: No Does the patient have difficulty concentrating, remembering, or making decisions?: No Patient able to express need for assistance with ADLs?: Yes Does the patient have difficulty dressing or bathing?: No Independently performs ADLs?: Yes (appropriate for developmental age) Does the patient have difficulty walking or climbing stairs?: No Weakness of Legs: Right Weakness of Arms/Hands: None  Home Assistive Devices/Equipment Home Assistive Devices/Equipment: None    Abuse/Neglect Assessment (Assessment to be complete while patient is alone) Physical Abuse: Denies Verbal Abuse: Denies Sexual Abuse: Denies Exploitation of patient/patient's resources: Denies Self-Neglect: Denies     Merchant navy officer (For Healthcare) Advance Directive: Patient does not have advance directive;Patient would not like information Pre-existing out of facility DNR order (yellow form or pink MOST form): No Nutrition Screen- [facility] Adult/WL/AP Patient's home diet: Regular  Additional Information 1:1 In Past 12 Months?: No CIRT Risk: No Elopement Risk: No Does patient have medical clearance?: Yes     Disposition:  Disposition Initial Assessment Completed  for this Encounter: Yes Disposition of Patient: Inpatient treatment program Type of inpatient treatment program: Adult Patient referred to:  [facility])  Pt continues to verbalize suicidal ideation with plan and states he cannot contract for safety outside a hospital. Consulted with Dr.  Geoffery Lyons who accepted Pt to [facility] under the service of Dr. Mervyn Gay, room [phone]. Pt agrees to voluntary admission. Notified ED MD at [facility] of acceptance.  Patsy Baltimore, Harlin Rain 01/11/2013 6:59 PM

## 2013-01-11 NOTE — ED Notes (Signed)
Pt. oob to the bathroom, gait steady, ate 100% of breakfast.

## 2013-01-11 NOTE — ED Provider Notes (Signed)
Patient accepted at [facility] under Dr. Dub Mikes. Stable for transfer.   [name] Canal, MD 01/11/13 [phone]

## 2013-01-11 NOTE — ED Notes (Signed)
Pt. Is sleeping.

## 2013-01-11 NOTE — Tx Team (Signed)
Initial Interdisciplinary Treatment Plan  PATIENT STRENGTHS: (choose at least two) Capable of independent living Physical Health  PATIENT STRESSORS: Financial difficulties   PROBLEM LIST: Problem List/Patient Goals Date to be addressed Date deferred Reason deferred Estimated date of resolution  Substance Abuse 01/11/13     Depression 01/11/13     Suicide Risk 01/11/13                                          DISCHARGE CRITERIA:  Improved stabilization in mood, thinking, and/or behavior Motivation to continue treatment in a less acute level of care Need for constant or close observation no longer present Reduction of life-threatening or endangering symptoms to within safe limits Verbal commitment to aftercare and medication compliance  PRELIMINARY DISCHARGE PLAN: Outpatient therapy Return to previous living arrangement  PATIENT/FAMIILY INVOLVEMENT: This treatment plan has been presented to and reviewed with the patient, [name] [name], and/or family member.  The patient and family have been given the opportunity to ask questions and make suggestions.  [name] 01/11/2013, 10:15 PM

## 2013-01-11 NOTE — ED Notes (Signed)
Pt. Would like to take a shower after lunch

## 2013-01-11 NOTE — Progress Notes (Signed)
Patient ID: [name] [name], male   DOB: February 18, 1981, 32 y.o.   MRN: [id number]  Admission Note: Patient is a 32 yo male admitted voluntarily to [facility] for depression, SI with plans to jump off of a bridge and ETOH abuse. Pt states he recently lost his job and has been getting into arguments with friends, who blame him for things. Pt states he has had command auditory hallucinations to harm himself, but does not hear any on admission. Pt verbally contracts for safety at this time. Pt states he has been drinking a 12 pack of beer daily, along with 5-6 shots of liquor for several weeks. Pt with dull, flat affect but is cooperative with assessment. Q 15 minute safety checks initiated per protocol.

## 2013-01-12 ENCOUNTER — Encounter (HOSPITAL_COMMUNITY): Payer: Self-pay | Admitting: Psychiatry

## 2013-01-12 DIAGNOSIS — F1994 Other psychoactive substance use, unspecified with psychoactive substance-induced mood disorder: Secondary | ICD-10-CM

## 2013-01-12 DIAGNOSIS — F1020 Alcohol dependence, uncomplicated: Principal | ICD-10-CM

## 2013-01-12 MED ORDER — TRAZODONE HCL 100 MG PO TABS
100.0000 mg | ORAL_TABLET | Freq: Every day | ORAL | Status: DC
  Administered 2013-01-12 – 2013-01-15 (×4): 100 mg via ORAL
  Filled 2013-01-12 (×6): qty 1
  Filled 2013-01-12: qty 14

## 2013-01-12 MED ORDER — PAROXETINE HCL 20 MG PO TABS
20.0000 mg | ORAL_TABLET | ORAL | Status: DC
  Administered 2013-01-12 – 2013-01-16 (×5): 20 mg via ORAL
  Filled 2013-01-12 (×7): qty 1
  Filled 2013-01-12: qty 14

## 2013-01-12 MED ORDER — GABAPENTIN 300 MG PO CAPS
300.0000 mg | ORAL_CAPSULE | Freq: Three times a day (TID) | ORAL | Status: DC
  Administered 2013-01-12 – 2013-01-16 (×13): 300 mg via ORAL
  Filled 2013-01-12 (×9): qty 1
  Filled 2013-01-12: qty 42
  Filled 2013-01-12 (×6): qty 1
  Filled 2013-01-12 (×2): qty 42
  Filled 2013-01-12 (×3): qty 1

## 2013-01-12 MED ORDER — QUETIAPINE FUMARATE 100 MG PO TABS
100.0000 mg | ORAL_TABLET | Freq: Every day | ORAL | Status: DC
  Administered 2013-01-12 – 2013-01-15 (×4): 100 mg via ORAL
  Filled 2013-01-12 (×5): qty 1
  Filled 2013-01-12: qty 14
  Filled 2013-01-12: qty 1

## 2013-01-12 MED ORDER — DULOXETINE HCL 60 MG PO CPEP
60.0000 mg | ORAL_CAPSULE | Freq: Every day | ORAL | Status: DC
  Administered 2013-01-12 – 2013-01-16 (×5): 60 mg via ORAL
  Filled 2013-01-12 (×6): qty 1
  Filled 2013-01-12: qty 14
  Filled 2013-01-12: qty 1

## 2013-01-12 NOTE — Progress Notes (Signed)
D) Pt has come to the groups and interacts with his peers. Pt states he feels safe here and is glad that he was able to come back. States that he feels unsafe and unsure outside of the hospital right now. Reports that he continues to hear voices that are command in nature, but is not responding to them. Rates his depression and hopelessness both at a 7. Admits to suicidal thoughts. A) Pt given support and reassurance. Encouraged to come to groups and participate. R) Contracts for safety on the unit.

## 2013-01-12 NOTE — Progress Notes (Signed)
Pt. Attended group 

## 2013-01-12 NOTE — [facility] Group Notes (Signed)
[facility] Group Notes:  (Clinical Social Work)  01/12/2013   3:00-4:00PM  Summary of Progress/Problems:   The main focus of today's process group was to   identify the patient's current support system and decide on other supports that can be put in place.  The picture on workbook was used to discuss why additional supports are needed, and a hand-out was distributed with four definitions/levels of support, then used to talk about how patients have given and received all different kinds of support.  An emphasis was placed on using counselor, doctor, therapy groups, 12-step groups, and problem-specific support groups to expand supports.  The patient identified current supports as being sister, job, and friends.  He listened to the remainder of discussion without contributing.  Type of Therapy:  Process Group  Participation Level:  Minimal  Participation Quality:  Attentive  Affect:  Depressed and Flat  Cognitive:  Oriented  Insight:  Developing/Improving  Engagement in Therapy:  Developing/Improving  Modes of Intervention:  Education,  Support and Processing  Ambrose Mantle, LCSW 01/12/2013, 4:49 PM

## 2013-01-12 NOTE — H&P (Signed)
Psychiatric Admission Assessment Adult  Patient Identification:  [name] [name] Date of Evaluation:  01/12/2013 Chief Complaint:  BIPOLAR D/O,NOS History of Present Illness:the patient presented to the ED reporting  A sore ankle and hearing voices with suicidal ideation. He noted tha the voices started Monday and he presneted to the ED on Tuesday.  He states that he has been drinking a 12 pack a day plus 5 shots of liqour each day.for the past 12 weeks. He denies withdrawal symptoms but states the voices are the bigger problems.  He notes that he hasn't been sleeping well, has a poor appetite, is significanly depressed at a 8/10, and his anxiety is a 7/10.  He notes that he hears command voices telling him what to do. Elements:  Location:  Adult in patient . Quality:  chronic. Severity:  moderate. Timing:  chronic. Duration:  years. Context:  homeless, substance abuse. Associated Signs/Synptoms: Depression Symptoms:  depressed mood, (Hypo) Manic Symptoms:  Hallucinations, Anxiety Symptoms:  Excessive Worry, Psychotic Symptoms:  Hallucinations: Auditory Command:  self harm PTSD Symptoms: denies  Psychiatric Specialty Exam: Physical Exam  Constitutional: He appears well-developed and well-nourished.  Patient is seen and the chart is reviewed. No further exam is needed, as I agree with the exam completed in the ED with no exceptions.    Review of Systems  Constitutional: Negative.  Negative for fever, chills, weight loss, malaise/fatigue and diaphoresis.  HENT: Negative for congestion and sore throat.   Eyes: Negative for blurred vision, double vision and photophobia.  Respiratory: Negative for cough, shortness of breath and wheezing.   Cardiovascular: Negative for chest pain, palpitations and PND.  Gastrointestinal: Negative for heartburn, nausea, vomiting, abdominal pain, diarrhea and constipation.  Musculoskeletal: Negative for myalgias, joint pain and falls.  Neurological:  Negative for dizziness, tingling, tremors, sensory change, speech change, focal weakness, seizures, loss of consciousness, weakness and headaches.  Endo/Heme/Allergies: Negative for polydipsia. Does not bruise/bleed easily.  Psychiatric/Behavioral: Positive for depression, suicidal ideas, hallucinations, memory loss and substance abuse. The patient is nervous/anxious and has insomnia.     Blood pressure 115/71, pulse 93, temperature 97.4 F (36.3 C), temperature source Oral, resp. rate 18, height 6\' 1"  (1.854 m), weight 127.461 kg (281 lb).Body mass index is 37.08 kg/(m^2).  General Appearance: Disheveled  Eye Contact::  Minimal  Speech:  Slow  Volume:  Normal  Mood:  Anxious and Depressed  Affect:  Congruent  Thought Process:  Disorganized  Orientation:  NA  Thought Content: linear  Suicidal Thoughts:  Yes.  with intent/plan  Homicidal Thoughts:  No  Memory:  NA  Judgement:  Impaired  Insight:  Lacking  Psychomotor Activity:  Normal  Concentration:  Fair  Recall:  Fair  Akathisia:  No  Handed:  Right  AIMS (if indicated):     Assets:  Desire for Improvement Physical Health  Sleep:  Number of Hours: 5.5    Past Psychiatric History: Diagnosis:  Hospitalizations:  Outpatient Care:  Substance Abuse Care:  Self-Mutilation:  Suicidal Attempts:  Violent Behaviors:   Past Medical History:   Past Medical History  Diagnosis Date  . Mental disorder   . Hypertension   . Depression   . Bipolar 1 disorder   . Irregular heart rate   . Asthma    None. Allergies:  No Known Allergies PTA Medications: Prescriptions prior to admission  Medication Sig Dispense Refill  . acetaminophen (TYLENOL) 500 MG tablet Take 1,000 mg by mouth 2 (two) times daily as needed for  pain.      . albuterol (PROVENTIL HFA;VENTOLIN HFA) 108 (90 BASE) MCG/ACT inhaler Inhale 2 puffs into the lungs 2 (two) times daily as needed for wheezing or shortness of breath.       . DULoxetine (CYMBALTA) 60 MG  capsule Take 60 mg by mouth daily.      Marland Kitchen gabapentin (NEURONTIN) 300 MG capsule Take 300 mg by mouth 3 (three) times daily.      Marland Kitchen PARoxetine (PAXIL) 20 MG tablet Take 20 mg by mouth every morning.      Marland Kitchen QUEtiapine (SEROQUEL) 100 MG tablet Take 100 mg by mouth at bedtime.      . traZODone (DESYREL) 100 MG tablet Take 100 mg by mouth at bedtime.        Previous Psychotropic Medications:  Medication/Dose                 Substance Abuse History in the last 12 months:  yes  Consequences of Substance Abuse: Medical Consequences:  worsening mental health  Social History:  reports that he has been smoking.  He has never used smokeless tobacco. He reports that  drinks alcohol. He reports that he does not use illicit drugs. Additional Social History: History of alcohol / drug use?: Yes Longest period of sobriety (when/how long): months Negative Consequences of Use: Financial Withdrawal Symptoms: Tremors                    Current Place of Residence:   Place of Birth:   Family Members: Marital Status:  Single Children:  Sons:  Daughters: Relationships: Education:  3rd grade states special classes for reading and writing. States his parents moved around alot  and hed did not like school. Educational Problems/Performance: Religious Beliefs/Practices: History of Abuse (Emotional/Phsycial/Sexual) Occupational Experiences; Military History:  None. Legal History: Hobbies/Interests:  Family History:   Family History  Problem Relation Age of Onset  . Hypertension Mother   . Hypertension Other     No results found for this or any previous visit (from the past 72 hour(s)). Psychological Evaluations:  Assessment:   AXIS I:  Substance induced mood disorder, alcohol dependence AXIS II:  Borderline IQ AXIS III:   Past Medical History  Diagnosis Date  . Mental disorder   . Hypertension   . Depression   . Bipolar 1 disorder   . Irregular heart rate   . Asthma     AXIS IV:  problems related to social environment AXIS V:  31-40 impairment in reality testing  Treatment Plan/Recommendations:  1. Admit for crisis management and stabilization. 2. Medication management to reduce current symptoms to base line and improve the patient's overall level of functioning. 3. Treat health problems as indicated. 4. Develop treatment plan to decrease risk of relapse upon discharge and to reduce the need for readmission. 5. Psycho-social education regarding relapse prevention and self care. 6. Health care follow up as needed for medical problems. 7. Restart home medications where appropriate.   Treatment Plan Summary: Daily contact with patient to assess and evaluate symptoms and progress in treatment Medication management Supportive approach/coping skills/relapse prevention Detox/reassess and address the co morbidities Current Medications:  Current Facility-Administered Medications  Medication Dose Route Frequency Provider Last Rate Last Dose  . acetaminophen (TYLENOL) tablet 650 mg  650 mg Oral Q6H PRN Rachael Fee, MD   650 mg at 01/12/13 [phone]  . alum & mag hydroxide-simeth (MAALOX/MYLANTA) 200-200-20 MG/5ML suspension 30 mL  30 mL Oral Q4H PRN  Rachael Fee, MD      . chlordiazePOXIDE (LIBRIUM) capsule 25 mg  25 mg Oral Q6H PRN Rachael Fee, MD      . hydrOXYzine (ATARAX/VISTARIL) tablet 25 mg  25 mg Oral Q6H PRN Rachael Fee, MD      . loperamide (IMODIUM) capsule 2-4 mg  2-4 mg Oral PRN Rachael Fee, MD      . magnesium hydroxide (MILK OF MAGNESIA) suspension 30 mL  30 mL Oral Daily PRN Rachael Fee, MD      . multivitamin with minerals tablet 1 tablet  1 tablet Oral Daily Rachael Fee, MD   1 tablet at 01/12/13 0818  . ondansetron (ZOFRAN-ODT) disintegrating tablet 4 mg  4 mg Oral Q6H PRN Rachael Fee, MD      . thiamine (B-1) injection 100 mg  100 mg Intramuscular Once Rachael Fee, MD      . thiamine (VITAMIN B-1) tablet 100 mg  100 mg Oral  Daily Rachael Fee, MD   100 mg at 01/12/13 0818    Observation Level/Precautions:  Detox  Laboratory:  reviewed  Psychotherapy:    Medications:    Consultations:    Discharge Concerns:    Estimated LOS:3-5 days.  Other:     I certify that inpatient services furnished can reasonably be expected to improve the patient's condition.   [name] 11:56 PM 01/12/2013

## 2013-01-12 NOTE — Progress Notes (Signed)
Psychoeducational Group Note  Date:  01/11/2013 Time:  2000  Group Topic/Focus:  Wrap-Up Group:   The focus of this group is to help patients review their daily goal of treatment and discuss progress on daily workbooks.  Participation Level: Did Not Attend  Participation Quality:  Not Applicable  Affect:  Not Applicable  Cognitive:  Not Applicable  Insight:  Not Applicable  Engagement in Group: Not Applicable  Additional Comments:  The patient did not attend group since he was admitted after the group concluded.   Adilen Pavelko S 01/12/2013, 1:10 AM

## 2013-01-12 NOTE — Progress Notes (Signed)
Psychoeducational Group Note  Date: 01/12/2013 Time:  01/12/2013  Group Topic/Focus:  Gratefulness:  The focus of this group is to help patients identify what two things they are most grateful for in their lives. What helps ground them and to center them on their work to their recovery.  Participation Level:  Did Not Attend  Josiah Wojtaszek A   

## 2013-01-12 NOTE — [facility] Suicide Risk Assessment (Signed)
Suicide Risk Assessment  Admission Assessment     Nursing information obtained from:  Patient Demographic factors:  Male;Caucasian;Low socioeconomic status;Unemployed Current Mental Status:  Suicidal ideation indicated by patient Loss Factors:  Financial problems / change in socioeconomic status Historical Factors:  Prior suicide attempts;Family history of mental illness or substance abuse Risk Reduction Factors:  Positive therapeutic relationship  CLINICAL FACTORS:   Depression:   Comorbid alcohol abuse/dependence Alcohol/Substance Abuse/Dependencies  COGNITIVE FEATURES THAT CONTRIBUTE TO RISK:  Closed-mindedness Polarized thinking Thought constriction (tunnel vision)    SUICIDE RISK:   Moderate:  Frequent suicidal ideation with limited intensity, and duration, some specificity in terms of plans, no associated intent, good self-control, limited dysphoria/symptomatology, some risk factors present, and identifiable protective factors, including available and accessible social support.  PLAN OF CARE: Supportive approach/coping skills/relapse prevention                               Identify detox needs                               Reassess and address the comobidities  I certify that inpatient services furnished can reasonably be expected to improve the patient's condition.  Janneth Krasner A 01/12/2013, 12:42 PM

## 2013-01-12 NOTE — Progress Notes (Signed)
This nurse is in receipt of pt's shirt and shorts ( after housekeeping and washed and

## 2013-01-12 NOTE — Progress Notes (Signed)
Adult Psychoeducational Group Note  Date:  01/12/2013 Time:  1:30PM  Group Topic/Focus:  Making Healthy Choices:   The focus of this group is to help patients identify negative/unhealthy choices they were using prior to admission and identify positive/healthier coping strategies to replace them upon discharge.  Participation Level:  Minimal  Participation Quality:  Resistant  Affect:  Appropriate and Flat  Cognitive:  Appropriate  Insight: Lacking  Engagement in Group:  Limited  Modes of Intervention:  Discussion  Additional Comments:  Pt had little participation throughout group. When asked to share irrational and rational thoughts about himself, pt shrugged his shoulders and said, "I don't know." Pt was encouraged to at least share one positive thing about himself. Pt said that he is good at playing basketball  Denzil Mceachron K 01/12/2013, 3:13 PM

## 2013-01-12 NOTE — Progress Notes (Signed)
Psychoeducational Group Note  Date:  01/12/2013 Time:  1015  Group Topic/Focus:  Making Healthy Choices:   The focus of this group is to help patients identify negative/unhealthy choices they were using prior to admission and identify positive/healthier coping strategies to replace them upon discharge.  Participation Level:  Did Not Attend  Jenipher Havel A 01/12/2013  

## 2013-01-13 DIAGNOSIS — F323 Major depressive disorder, single episode, severe with psychotic features: Secondary | ICD-10-CM
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
## 2013-01-13 NOTE — Progress Notes (Signed)
Recreation Therapy Notes  Date: 08.11.2014 Time: 3:00pm Location: 500 Hall Dayroom  Group Topic: Stress Management  Goal Area(s) Addresses:  Patient will verbalize importance of using healthy stress management.  Patient will identify stress management technique of choice.  Behavioral Response: Engaged in activity, Appropriate, Actively listener  Intervention: Visualization  Activity: Activity: Guided Imagery. Patients listened to recorded Guided Imagery script about a day at the beach. Patients received stress ball at the conclusion of group session. Patients were instructed on rules of having a stress ball and verbalized understanding on rules.  Education:  Pharmacologist, Discharge Planing  Education Outcome: Acknowledges understanding  Clinical Observations/Feedback: Patient arrived to group session at approximately 3:15pm. Upon entering group session patient sat quietly and engaged in guided imagery script. Patient made no contributions to opening or wrap up discussion, however he appeared to actively listen as he maintained appropriate eye contact with speaker and followed the conversation around the room.   Marykay Lex Osvaldo Lamping, LRT/CTRS  Jearl Klinefelter 01/13/2013 4:48 PM

## 2013-01-13 NOTE — Progress Notes (Signed)
Patient ID: [name] [name], male   DOB: 18-May-1981, 32 y.o.   MRN: [id number]  Patient belongings inventory faxed to [facility] per Leretha Dykes, RN [facility] Charge nurse from the previous day in the ED. Per worksheet, pt with 1 shirt, two pairs of shoes, 1 pair of pants, 1 bus pass, and a $10 dollar bill. Per Wonda Amis, [facility] pt's bus pass replaced, and a $10 dollar bill returned to belongings in locker.

## 2013-01-13 NOTE — Progress Notes (Signed)
D.  Pt. Reports passive SI with no plan.  Denies HI and denies A/V hallucinations.  Interacting appropriately with staff and peers.  Pt. Attending groups. A.  Encourage pt. To continue to attend groups. R.  Pt. Contracts for safety and safety maintained.

## 2013-01-13 NOTE — Progress Notes (Signed)
Psychoeducational Group Note  Date:  01/13/2013 Time:  1100  Group Topic/Focus:  Self Care:   The focus of this group is to help patients understand the importance of self-care in order to improve or restore emotional, physical, spiritual, interpersonal, and financial health.  Participation Level: Did Not Attend  Participation Quality:  Not Applicable  Affect:  Not Applicable  Cognitive:  Not Applicable  Insight:  Not Applicable  Engagement in Group: Not Applicable  Additional Comments:  Patient did not attend group, patient remained in bed.  Karleen Hampshire Brittini 01/13/2013, 6:55 PM

## 2013-01-13 NOTE — Progress Notes (Addendum)
Patient ID: [name] [name], male   DOB: [birth year], 32 y.o.   MRN: [id number] D)   Pt has been pleasant and cooperative this evening, inquired about the money($10.00) that had been in his pocket at time of arrival, supervisor was able to find the money as well as his bus pass, which were put into an envelope and locked in locker 36.  Belongings sheet from ED placed in chart, under 'Discharge'.   Attended group, compliant with meds, has been pleasant and patient while waiting to know about his money and pass.  Went to bed before knowing they had been found.  No other c/o's voiced, able to contract for safety.   A)  Will continue to monitor for safety, continue POC. R)  Safety maintained.

## 2013-01-13 NOTE — Progress Notes (Signed)
[facility] Group Notes:  (Nursing/MHT/Case Management/Adjunct)  Date:  01/12/2013 Time:  2000  Type of Therapy:  Psychoeducational Skills  Participation Level:  Minimal  Participation Quality:  Inattentive  Affect:  Flat  Cognitive:  Appropriate  Insight:  Lacking  Engagement in Group:  Lacking  Modes of Intervention:  Education  Summary of Progress/Problems: The patient stated that he had a good day since he was able to rest. He had very little else to share with the group. His goal for tomorrow is to "get better".   Alain Deschene S 01/13/2013, 12:19 AM

## 2013-01-13 NOTE — [facility] Group Notes (Signed)
[facility] LCSW Aftercare Discharge Planning Group Note   01/13/2013 8:45 AM  Participation Quality:  Alert and Appropriate   Mood/Affect:  Appropriate  Depression Rating:  8  Anxiety Rating:  8  Thoughts of Suicide:  Pt denies SI/HI  Will you contract for safety?   Yes  Current AVH:  Pt denies   Plan for Discharge/Comments:  Pt attended discharge planning group and actively participated in group.  CSW provided pt with today's workbook.  Pt states that he came here for suicidal ideation and alcohol abuse.  Pt states that he lives in [place] with friends.  Pt states that he was supposed to go to [facility] a month ago but didn't make it.  Pt states that he would like to go back to [facility] upon d/c from here.  CSW will assess for appropriate referrals.  No further needs voiced by pt at this time.    Transportation Means: Pt reports access to transportation  Supports: No supports mentioned at this time  Reyes Ivan, LCSWA 01/13/2013 9:50 AM

## 2013-01-13 NOTE — [facility] Group Notes (Signed)
[facility] LCSW Group Therapy  01/13/2013  1:15 PM   Type of Therapy:  Group Therapy  Participation Level:  Active  Participation Quality:  Appropriate and Attentive  Affect:  Appropriate  Cognitive:  Alert and Appropriate  Insight:  Developing/Improving and Engaged  Engagement in Therapy:  Developing/Improving and Engaged  Modes of Intervention:  Clarification, Confrontation, Discussion, Education, Exploration, Limit-setting, Orientation, Problem-solving, Rapport Building, Dance movement psychotherapist, Socialization and Support  Summary of Progress/Problems: Pt identified obstacles faced currently and processed barriers involved in overcoming these obstacles. Pt identified steps necessary for overcoming these obstacles and explored motivation (internal and external) for facing these difficulties head on. Pt further identified one area of concern in their lives and chose a goal to focus on for today.  Pt shared that his biggest obstacle is unemployment and housing.  Pt was then able to relate his alcohol use to his lack of housing and employment and how his alcohol use must be addressed first.  Pt discussed his plan to go to [facility] a month ago but letting negative friends influence him not to go.  Pt states that he is motivated and eager to go for further treatment from here, so he can obtain a job and housing in the future.  Pt actively participated and was engaged in group discussion.    [name] [name], LCSWA 01/13/2013 2:40 PM

## 2013-01-13 NOTE — Tx Team (Signed)
Interdisciplinary Treatment Plan Update (Adult)  Date: 01/13/2013  Time Reviewed:  9:45 AM  Progress in Treatment: Attending groups: Yes Participating in groups:  Yes Taking medication as prescribed:  Yes Tolerating medication:  Yes Family/Significant othe contact made: No Patient understands diagnosis:  Yes Discussing patient identified problems/goals with staff:  Yes Medical problems stabilized or resolved:  Yes Denies suicidal/homicidal ideation: Yes Issues/concerns per patient self-inventory:  Yes Other:  New problem(s) identified: N/A  Discharge Plan or Barriers: CSW assessing for appropriate referrals.  Reason for Continuation of Hospitalization: Anxiety Depression Medication Stabilization  Comments: N/A  Estimated length of stay: 3-5 days  For review of initial/current patient goals, please see plan of care.  Attendees: Patient:     Family:     Physician:  Dr. Javier Glazier 01/13/2013 12:20 PM   Nursing:   Dellia Cloud, RN 01/13/2013 12:20 PM   Clinical Social Worker:  Reyes Ivan, LCSWA 01/13/2013 12:20 PM   Other: Verne Spurr, PA 01/13/2013 12:20 PM   Other:  Frankey Shown, MA care coordination 01/13/2013 12:20 PM   Other:  Juline Patch, LCSW 01/13/2013 12:20 PM   Other:  Neill Loft, RN 01/13/2013 12:21 PM   Other: Quintella Reichert, RN 01/13/2013 12:22 PM   Other:    Other:    Other:    Other:    Other:     Scribe for Treatment Team:   Carmina [place], 01/13/2013 12:20 PM

## 2013-01-13 NOTE — ED Notes (Signed)
[facility] [place] at [facility] called me to see if pt came from [place] in the psych ed recently and about his belongings as there are some questions about his belongings. One note indicates pt changed into scrubs but it doesn't say anything about pt's belongings at all.

## 2013-01-13 NOTE — Progress Notes (Signed)
[facility] MD Progress Note  01/13/2013 1:20 PM [name] [name]  MRN:  [id number]  Subjective:  Patient is seen in his room resting in bed. He has been up and active in the unit millieu. He states his depression is "about the same" at a 7/10, and his anxiety is a 6/10. He is renewed in his desire to participate in rehab at [facility] for his alcohol problem.  Diagnosis:  MDD severe with psychotic features  ADL's:  Intact  Sleep: Fair  Appetite:  Good  Suicidal Ideation:  Yes, has plans to OD or jump off a bridge. Can contract for safety on this unit. Homicidal Ideation:  denies AEB (as evidenced by):  Psychiatric Specialty Exam: Review of Systems  Constitutional: Negative.  Negative for fever, chills, weight loss, malaise/fatigue and diaphoresis.  HENT: Negative for congestion and sore throat.   Eyes: Negative for blurred vision, double vision and photophobia.  Respiratory: Negative for cough, shortness of breath and wheezing.   Cardiovascular: Negative for chest pain, palpitations and PND.  Gastrointestinal: Negative for heartburn, nausea, vomiting, abdominal pain, diarrhea and constipation.  Musculoskeletal: Negative for myalgias, joint pain and falls.  Neurological: Negative for dizziness, tingling, tremors, sensory change, speech change, focal weakness, seizures, loss of consciousness, weakness and headaches.  Endo/Heme/Allergies: Negative for polydipsia. Does not bruise/bleed easily.  Psychiatric/Behavioral: Negative for depression, suicidal ideas, hallucinations, memory loss and substance abuse. The patient is not nervous/anxious and does not have insomnia.     Blood pressure 104/68, pulse 103, temperature 98.3 F (36.8 C), temperature source Oral, resp. rate 18, height 6\' 1"  (1.854 m), weight 127.461 kg (281 lb).Body mass index is 37.08 kg/(m^2).  General Appearance: Disheveled  Eye Solicitor::  Fair  Speech:  Clear and Coherent  Volume:  Normal  Mood:  Depressed  Affect:   Congruent  Thought Process:  Goal Directed  Orientation:  Full (Time, Place, and Person)  Thought Content:  States auditory hallucinations come and go  Suicidal Thoughts:  Yes.  with intent/plan  Homicidal Thoughts:  No  Memory:  Immediate;   Fair  Judgement:  Impaired  Insight:  Lacking  Psychomotor Activity:  Normal  Concentration:  Fair  Recall:  Fair  Akathisia:  No  Handed:  Right  AIMS (if indicated):     Assets:  Desire for Improvement Physical Health  Sleep:  Number of Hours: 6.75   Current Medications: Current Facility-Administered Medications  Medication Dose Route Frequency Provider Last Rate Last Dose  . acetaminophen (TYLENOL) tablet 650 mg  650 mg Oral Q6H PRN Rachael Fee, MD   650 mg at 01/12/13 [phone]  . alum & mag hydroxide-simeth (MAALOX/MYLANTA) 200-200-20 MG/5ML suspension 30 mL  30 mL Oral Q4H PRN Rachael Fee, MD      . chlordiazePOXIDE (LIBRIUM) capsule 25 mg  25 mg Oral Q6H PRN Rachael Fee, MD      . DULoxetine (CYMBALTA) DR capsule 60 mg  60 mg Oral Daily Verne Spurr, PA-C   60 mg at 01/13/13 9528  . gabapentin (NEURONTIN) capsule 300 mg  300 mg Oral TID Verne Spurr, PA-C   300 mg at 01/13/13 1157  . hydrOXYzine (ATARAX/VISTARIL) tablet 25 mg  25 mg Oral Q6H PRN Rachael Fee, MD      . loperamide (IMODIUM) capsule 2-4 mg  2-4 mg Oral PRN Rachael Fee, MD      . magnesium hydroxide (MILK OF MAGNESIA) suspension 30 mL  30 mL Oral Daily PRN Reymundo Poll  Dub Mikes, MD      . multivitamin with minerals tablet 1 tablet  1 tablet Oral Daily Rachael Fee, MD   1 tablet at 01/13/13 0813  . ondansetron (ZOFRAN-ODT) disintegrating tablet 4 mg  4 mg Oral Q6H PRN Rachael Fee, MD      . PARoxetine (PAXIL) tablet 20 mg  20 mg Oral [address] PA-C   20 mg at 01/13/13 0729  . QUEtiapine (SEROQUEL) tablet 100 mg  100 mg Oral QHS Verne Spurr, PA-C   100 mg at 01/12/13 2119  . thiamine (B-1) injection 100 mg  100 mg Intramuscular Once Rachael Fee, MD      .  thiamine (VITAMIN B-1) tablet 100 mg  100 mg Oral Daily Rachael Fee, MD   100 mg at 01/13/13 0813  . traZODone (DESYREL) tablet 100 mg  100 mg Oral QHS Verne Spurr, PA-C   100 mg at 01/12/13 2119    Lab Results: No results found for this or any previous visit (from the past 48 hour(s)).  Physical Findings: AIMS: Facial and Oral Movements Muscles of Facial Expression: None, normal Lips and Perioral Area: None, normal Jaw: None, normal Tongue: None, normal,Extremity Movements Upper (arms, wrists, hands, fingers): None, normal Lower (legs, knees, ankles, toes): None, normal, Trunk Movements Neck, shoulders, hips: None, normal, Overall Severity Severity of abnormal movements (highest score from questions above): None, normal Incapacitation due to abnormal movements: None, normal Patient's awareness of abnormal movements (rate only patient's report): No Awareness, Dental Status Current problems with teeth and/or dentures?: No Does patient usually wear dentures?: No  CIWA:  CIWA-Ar Total: 5 COWS:  COWS Total Score: 0  Treatment Plan Summary: Daily contact with patient to assess and evaluate symptoms and progress in treatment Medication management  Plan: 1. Continue crisis management and stabilization. 2. Medication management to reduce current symptoms to base line and improve patient's overall level of functioning 3. Treat health problems as indicated. 4. Develop treatment plan to decrease risk of relapse upon discharge and the need for     readmission. 5. Psycho-social education regarding relapse prevention and self care. 6. Health care follow up as needed for medical problems. 7. Continue home medications where appropriate. 8. ELOS: Tuesday or Wednesday to [facility].   Medical Decision Making Problem Points:  Established problem, stable/improving (1) Data Points:  Review and summation of old records (2)  I certify that inpatient services furnished can reasonably  be expected to improve the patient's condition.  [name] 1:24 PM 01/13/2013  Reviewed the information documented and agree with the treatment plan.  [name],[name]. 01/13/2013 4:09 PM

## 2013-01-13 NOTE — Progress Notes (Signed)
D:  Per pt self inventory pt reports sleeping well, appetite improving, energy level normal, ability to pay attention improving, rates depression at a 7 out of 10 and hopelessness at a 7 out of 10, denies SI/HI/AVH at this time, does endorse some Passive SI on and off and last heard voices this am.   A:  Emotional support provided, Encouraged pt to continue with treatment plan and attend all group activities, q15 min checks maintained for safety.  R:  Pt is calm and cooperative, is attending groups, pt is pleasant, states that he is having an "ok" day, pt is flat but answers questions appropriately.

## 2013-01-14 NOTE — Progress Notes (Signed)
The focus of this group is to educate the patient on the purpose and policies of crisis stabilization and provide a format to answer questions about their admission.  The group details unit policies and expectations of patients while admitted.  Patient attended 0900 nurse education orientation group.  Patient actively participated, appropriate affect, alert, appropriate insight and engagement.  Patient will work on discharge goals.  

## 2013-01-14 NOTE — Progress Notes (Signed)
D:  Patient up and active in the milieu today.  Has been attending and participating in groups today.  Affect is flat.  He rates depression and hopelessness at 7 and admits to having some off and on suicidal thoughts.  He does agree to seek out staff if feeling unsafe.  His appetite is fair.  A:  Medications given as prescribed.  Encouraged participation in all groups.  Offered support and encouragement.  R:  Pleasant and cooperative with staff.  Tolerating medications well.  Interacting some with peers.  Present in the milieu, but quiet.

## 2013-01-14 NOTE — [facility] Suicide Risk Assessment (Signed)
[facility] Adult Inpatient Family/Significant Other Suicide Prevention Education  Suicide Prevention Education:   Patient Refusal for Family/Significant Other Suicide Prevention Education: The patient has refused to provide written consent for family/significant other to be provided Family/Significant Other Suicide Prevention Education during admission and/or prior to discharge.  Physician notified.  CSW provided suicide prevention information with patient.    The suicide prevention education provided includes the following:  Suicide risk factors  Suicide prevention and interventions  National Suicide Hotline telephone number  [facility] assessment telephone number  [facility] Emergency Assistance 911  [facility] and/or Residential Mobile Crisis Unit telephone number   [name] [name], [place] 01/14/2013 2:15 PM

## 2013-01-14 NOTE — Progress Notes (Signed)
D: Patient resting in bed with eyes closed.  Respirations even and unlabored.  Patient appears to be in no apparent distress. A: Staff to monitor Q 15 mins for safety.   R:Patient remains safe on the unit.  

## 2013-01-14 NOTE — Progress Notes (Signed)
Recreation Therapy Notes  Date: 08.12.2014 Time: 2:45pm Location: 500 Hall Dayroom  Group Topic: Animal Assisted Activities  Goal Area(s) Addresses:  Patient will interact appropriately with dog team.    Behavioral Response: Engaged, Appropriate  Intervention: Animal Assisted Therapy Dog Team.   Clinical Observations/Feedback: Dog Team: [employer]. Patient interacted appropriately with peer, dog team, LRT and MHT.   Marykay Lex Malka Bocek, LRT/CTRS  Jearl Klinefelter 01/14/2013 4:56 PM

## 2013-01-14 NOTE — Progress Notes (Signed)
D: Patient in the hallway on approach.  Patient states he had a good day.  Patient states he has had some bad thoughts today.  Patient states he is passive SI but verbally contracts for safety.  Patient states he would come speak with staff if he felt like harming himself.  Patient denies HI and denies AVH.   A: Staff to monitor Q 15 mins for safety.  Encouragement and support offered.  Scheduled medications administered per orders. R: Patient remains safe on the unit.  Patient attended group tonight.  Patient taking administered medications.  Patient calm, cooperative and interacting with peers.

## 2013-01-14 NOTE — Progress Notes (Signed)
Patient ID: [name] [name], male   DOB: 1980/06/29, 32 y.o.   MRN: [id number] [facility] MD Progress Note  01/14/2013 3:44 PM [name] [name]  MRN:  [id number]  Subjective:  Patient is seen in his room resting in bed. He reports having a "rough morning" as he stated that he had some increasing suicidal thoughts this morning upon awakening. He can not identify any triggers for this. He states that he still wants to pursue going to [facility] for his alcohol problems and is working with the Child psychotherapist on this. Diagnosis:  MDD severe with psychotic features  ADL's:  Intact  Sleep: Fair  Appetite:  Good  Suicidal Ideation:  Yes, has plans to OD or jump off a bridge. Can contract for safety on this unit. Homicidal Ideation:  denies AEB (as evidenced by):  Psychiatric Specialty Exam: Review of Systems  Constitutional: Negative.  Negative for fever, chills, weight loss, malaise/fatigue and diaphoresis.  HENT: Negative for congestion and sore throat.   Eyes: Negative for blurred vision, double vision and photophobia.  Respiratory: Negative for cough, shortness of breath and wheezing.   Cardiovascular: Negative for chest pain, palpitations and PND.  Gastrointestinal: Negative for heartburn, nausea, vomiting, abdominal pain, diarrhea and constipation.  Musculoskeletal: Negative for myalgias, joint pain and falls.  Neurological: Negative for dizziness, tingling, tremors, sensory change, speech change, focal weakness, seizures, loss of consciousness, weakness and headaches.  Endo/Heme/Allergies: Negative for polydipsia. Does not bruise/bleed easily.  Psychiatric/Behavioral: Negative for depression, suicidal ideas, hallucinations, memory loss and substance abuse. The patient is not nervous/anxious and does not have insomnia.     Blood pressure 106/75, pulse 102, temperature 97.6 F (36.4 C), temperature source Oral, resp. rate 16, height 6\' 1"  (1.854 m), weight 127.461 kg (281  lb).Body mass index is 37.08 kg/(m^2).  General Appearance: Disheveled  Eye Solicitor::  Fair  Speech:  Clear and Coherent  Volume:  Normal  Mood:  Depressed  Affect:  Congruent  Thought Process:  Goal Directed  Orientation:  Full (Time, Place, and Person)  Thought Content:  States auditory hallucinations come and go  Suicidal Thoughts:  Yes.  with intent/plan  Homicidal Thoughts:  No  Memory:  Immediate;   Fair  Judgement:  Impaired  Insight:  Lacking  Psychomotor Activity:  Normal  Concentration:  Fair  Recall:  Fair  Akathisia:  No  Handed:  Right  AIMS (if indicated):     Assets:  Desire for Improvement Physical Health  Sleep:  Number of Hours: 6.75   Current Medications: Current Facility-Administered Medications  Medication Dose Route Frequency Provider Last Rate Last Dose  . acetaminophen (TYLENOL) tablet 650 mg  650 mg Oral Q6H PRN Rachael Fee, MD   650 mg at 01/13/13 2204  . alum & mag hydroxide-simeth (MAALOX/MYLANTA) 200-200-20 MG/5ML suspension 30 mL  30 mL Oral Q4H PRN Rachael Fee, MD      . chlordiazePOXIDE (LIBRIUM) capsule 25 mg  25 mg Oral Q6H PRN Rachael Fee, MD      . DULoxetine (CYMBALTA) DR capsule 60 mg  60 mg Oral Daily Verne Spurr, PA-C   60 mg at 01/14/13 0810  . gabapentin (NEURONTIN) capsule 300 mg  300 mg Oral TID Verne Spurr, PA-C   300 mg at 01/14/13 1150  . hydrOXYzine (ATARAX/VISTARIL) tablet 25 mg  25 mg Oral Q6H PRN Rachael Fee, MD      . loperamide (IMODIUM) capsule 2-4 mg  2-4 mg Oral PRN Madie Reno  A Lugo, MD      . magnesium hydroxide (MILK OF MAGNESIA) suspension 30 mL  30 mL Oral Daily PRN Rachael Fee, MD      . multivitamin with minerals tablet 1 tablet  1 tablet Oral Daily Rachael Fee, MD   1 tablet at 01/14/13 [id number]  . ondansetron (ZOFRAN-ODT) disintegrating tablet 4 mg  4 mg Oral Q6H PRN Rachael Fee, MD      . PARoxetine (PAXIL) tablet 20 mg  20 mg Oral [address] [place], PA-C   20 mg at 01/14/13 9604  . QUEtiapine  (SEROQUEL) tablet 100 mg  100 mg Oral QHS Verne Spurr, PA-C   100 mg at 01/13/13 2202  . thiamine (VITAMIN B-1) tablet 100 mg  100 mg Oral Daily Rachael Fee, MD   100 mg at 01/14/13 5409  . traZODone (DESYREL) tablet 100 mg  100 mg Oral QHS Verne Spurr, PA-C   100 mg at 01/13/13 2202    Lab Results: No results found for this or any previous visit (from the past 48 hour(s)).  Physical Findings: AIMS: Facial and Oral Movements Muscles of Facial Expression: None, normal Lips and Perioral Area: None, normal Jaw: None, normal Tongue: None, normal,Extremity Movements Upper (arms, wrists, hands, fingers): None, normal Lower (legs, knees, ankles, toes): None, normal, Trunk Movements Neck, shoulders, hips: None, normal, Overall Severity Severity of abnormal movements (highest score from questions above): None, normal Incapacitation due to abnormal movements: None, normal Patient's awareness of abnormal movements (rate only patient's report): No Awareness, Dental Status Current problems with teeth and/or dentures?: No Does patient usually wear dentures?: No  CIWA:  CIWA-Ar Total: 0 COWS:  COWS Total Score: 0  Treatment Plan Summary: Daily contact with patient to assess and evaluate symptoms and progress in treatment Medication management  Plan: 1. Continue crisis management and stabilization. 2. Reviewed medication management and will make no changes at this time. 3. If the patient continues to endorse suicidal ideation will increase his Seroquel dose, but will wait 24 hours to observe. Medical Decision Making Problem Points:  Established problem, stable/improving (1) Data Points:  Review and summation of old records (2)  I certify that inpatient services furnished can reasonably be expected to improve the patient's condition.  [name] 3:44 PM 01/14/2013  Patient was evaluated, developed treatment and case discussed with treatment team. Reviewed the information  documented and agree with the treatment plan.   Nehemiah Settle., M.D. 01/17/2013 8:39 PM

## 2013-01-14 NOTE — [facility] Group Notes (Signed)
[facility] LCSW Group Therapy  01/14/2013  1:15 PM   Type of Therapy:  Group Therapy  Participation Level:  Active  Participation Quality:  Appropriate and Attentive  Affect:  Appropriate, Flat and Depressed  Cognitive:  Alert and Appropriate  Insight:  Developing/Improving and Engaged  Engagement in Therapy:  Developing/Improving and Engaged  Modes of Intervention:  Clarification, Confrontation, Discussion, Education, Exploration, Limit-setting, Orientation, Problem-solving, Rapport Building, Dance movement psychotherapist, Socialization and Support  Summary of Progress/Problems: The topic for group therapy was feelings about diagnosis.  Pt actively participated in group discussion on their past and current diagnosis and how they feel towards this.  Pt also identified how society and family members judge them, based on their diagnosis as well as stereotypes and stigmas.   Pt shared that he deals with depression, suicidal ideation and hearing voices.  Pt states that he plans to change his friends and environment so he can change his life and do well by staying on mediation and not drinking.  Pt actively participate and was engaged in group discussion.    [name] [name], LCSWA 01/14/2013 2:27 PM

## 2013-01-15 NOTE — [facility] Group Notes (Signed)
[facility] LCSW Aftercare Discharge Planning Group Note   01/15/2013 8:45 AM  Participation Quality:  Alert and Appropriate   Mood/Affect:  Appropriate, Flat and Depressed  Depression Rating:  8  Anxiety Rating:  8  Thoughts of Suicide:  Pt endorses SI today, denies HI  Will you contract for safety?   Yes  Current AVH: Pt denies  Plan for Discharge/Comments:  Pt attended discharge planning group and actively participated in group.  CSW provided pt with today's workbook.  Pt reports having a bad morning due to having "bad thoughts", explaining that bad thoughts means SI.  Pt states that he plans to stay at the shelter in [place].  CSW assisting pt in securing a bed at [facility].  Pt will follow up at [facility] for medication management and [facility] for further treatment.  No further needs voiced by pt at this time.    Transportation Means: Pt has access to transportation  Supports: No supports mentioned at this time  Reyes Ivan, LCSWA 01/15/2013 11:53 AM

## 2013-01-15 NOTE — Progress Notes (Signed)
Adult Psychoeducational Group Note  Date:  01/15/2013 Time:  1:58 PM  Group Topic/Focus:  Crisis Planning:   The purpose of this group is to help patients create a crisis plan for use upon discharge or in the future, as needed.  Participation Level:  None  Participation Quality:  Drowsy and Inattentive  Affect:  Lethargic and Pt slept throughout group.   Cognitive:  pt slept on and off throughout group  Engagement in Group:  None  Modes of Intervention:  Discussion, Education and Support  Additional Comments:  Pt attended group but did not participate as he slept through group.   Dalia Heading 01/15/2013, 1:58 PM

## 2013-01-15 NOTE — Progress Notes (Signed)
D:Pt reports depression and passive si. Pt observed interacting and laughing with peers.  A:Offered support, encouragement and 15 minute checks. R:P contracts with staff for safety. Safety maintained on the unit.

## 2013-01-15 NOTE — Progress Notes (Signed)
Patient ID: [name] [name], male   DOB: [birth year], 32 y.o.   MRN: [id number] [facility] MD Progress Note  01/15/2013 3:52 PM [name] [name]  MRN:  [id number]  Subjective: Patient is seen up and active on the unit millieu. He reports feeling better today and thinks he is ready to be discharged out tomorrow. He is planning to stay with a friend until he can get into [facility] for treatment for his alcoholism.  He states his depression is better, and the voices are resolving well. He has no problems with the medication and states there are no side effects.  He states his anxiety is better as well.  He appears to have abright affect, mood is stable, behavior is appropriate.  Diagnosis:  MDD severe with psychotic features  ADL's:  Intact  Sleep: Fair  Appetite:  Good  Suicidal Ideation:  Yes, has plans to OD or jump off a bridge. Can contract for safety on this unit. Homicidal Ideation:  denies AEB (as evidenced by):  Psychiatric Specialty Exam: Review of Systems  Constitutional: Negative.  Negative for fever, chills, weight loss, malaise/fatigue and diaphoresis.  HENT: Negative for congestion and sore throat.   Eyes: Negative for blurred vision, double vision and photophobia.  Respiratory: Negative for cough, shortness of breath and wheezing.   Cardiovascular: Negative for chest pain, palpitations and PND.  Gastrointestinal: Negative for heartburn, nausea, vomiting, abdominal pain, diarrhea and constipation.  Musculoskeletal: Negative for myalgias, joint pain and falls.  Neurological: Negative for dizziness, tingling, tremors, sensory change, speech change, focal weakness, seizures, loss of consciousness, weakness and headaches.  Endo/Heme/Allergies: Negative for polydipsia. Does not bruise/bleed easily.  Psychiatric/Behavioral: Negative for depression, suicidal ideas, hallucinations, memory loss and substance abuse. The patient is not nervous/anxious and does not have insomnia.     Blood  pressure 99/63, pulse 96, temperature 98.2 F (36.8 C), temperature source Oral, resp. rate 16, height 6\' 1"  (1.854 m), weight 127.461 kg (281 lb).Body mass index is 37.08 kg/(m^2).  General Appearance: Disheveled  Eye Solicitor::  Fair  Speech:  Clear and Coherent  Volume:  Normal  Mood:  Depressed  Affect:  Congruent  Thought Process:  Goal Directed  Orientation:  Full (Time, Place, and Person)  Thought Content:  States auditory hallucinations come and go  Suicidal Thoughts:  Yes.  with intent/plan  Homicidal Thoughts:  No  Memory:  Immediate;   Fair  Judgement:  Impaired  Insight:  Lacking  Psychomotor Activity:  Normal  Concentration:  Fair  Recall:  Fair  Akathisia:  No  Handed:  Right  AIMS (if indicated):     Assets:  Desire for Improvement Physical Health  Sleep:  Number of Hours: 6.75   Current Medications: Current Facility-Administered Medications  Medication Dose Route Frequency Provider Last Rate Last Dose  . acetaminophen (TYLENOL) tablet 650 mg  650 mg Oral Q6H PRN Rachael Fee, MD   650 mg at 01/13/13 2204  . alum & mag hydroxide-simeth (MAALOX/MYLANTA) 200-200-20 MG/5ML suspension 30 mL  30 mL Oral Q4H PRN Rachael Fee, MD      . DULoxetine (CYMBALTA) DR capsule 60 mg  60 mg Oral Daily Verne Spurr, PA-C   60 mg at 01/15/13 7829  . gabapentin (NEURONTIN) capsule 300 mg  300 mg Oral TID Verne Spurr, PA-C   300 mg at 01/15/13 1307  . magnesium hydroxide (MILK OF MAGNESIA) suspension 30 mL  30 mL Oral Daily PRN Rachael Fee, MD      .  multivitamin with minerals tablet 1 tablet  1 tablet Oral Daily Rachael Fee, MD   1 tablet at 01/15/13 [phone]  . PARoxetine (PAXIL) tablet 20 mg  20 mg Oral [address] PA-C   20 mg at 01/15/13 0347  . QUEtiapine (SEROQUEL) tablet 100 mg  100 mg Oral QHS Verne Spurr, PA-C   100 mg at 01/14/13 2123  . thiamine (VITAMIN B-1) tablet 100 mg  100 mg Oral Daily Rachael Fee, MD   100 mg at 01/15/13 4259  . traZODone  (DESYREL) tablet 100 mg  100 mg Oral QHS Verne Spurr, PA-C   100 mg at 01/14/13 2123    Lab Results: No results found for this or any previous visit (from the past 48 hour(s)).  Physical Findings: AIMS: Facial and Oral Movements Muscles of Facial Expression: None, normal Lips and Perioral Area: None, normal Jaw: None, normal Tongue: None, normal,Extremity Movements Upper (arms, wrists, hands, fingers): None, normal Lower (legs, knees, ankles, toes): None, normal, Trunk Movements Neck, shoulders, hips: None, normal, Overall Severity Severity of abnormal movements (highest score from questions above): None, normal Incapacitation due to abnormal movements: None, normal Patient's awareness of abnormal movements (rate only patient's report): No Awareness, Dental Status Current problems with teeth and/or dentures?: No Does patient usually wear dentures?: No  CIWA:  CIWA-Ar Total: 0 COWS:  COWS Total Score: 0  Treatment Plan Summary: Daily contact with patient to assess and evaluate symptoms and progress in treatment Medication management  Plan: 1. Continue crisis management and stabilization. 2. Reviewed medication management and will make no changes at this time. 3. ELOS: D/C out in AM. Medical Decision Making Problem Points:  Established problem, stable/improving (1) Data Points:  Review and summation of old records (2)  I certify that inpatient services furnished can reasonably be expected to improve the patient's condition.  [name] 3:52 PM 01/15/2013  Reviewed the information documented and agree with the treatment plan.  Nehemiah Settle., M.D. 01/17/2013 8:40 PM

## 2013-01-15 NOTE — Progress Notes (Signed)
Adult Psychoeducational Group Note  Date:  01/15/2013 Time:  10:00AM Group Topic/Focus:  Therapeutic Activitiy  Participation Level:  Active  Participation Quality:  Appropriate and Attentive  Affect:  Appropriate  Cognitive:  Alert and Appropriate  Insight: Appropriate  Engagement in Group:  Engaged  Modes of Intervention:  Discussion  Additional Comments:  Pt. Was attentive and appropriate durinng today's group discussion. Pt. Was able to play build a bear as therapeutic activity. Pt. Engaged in positive interaction and communication with peers. Pt. Was able to guess the following words Goals, Values, Self Control and other words containing to today's topic of Personal Development.   Bing Plume D 01/15/2013, 10:54 AM

## 2013-01-15 NOTE — Progress Notes (Signed)
Recreation Therapy Notes  Date: 08.13.2014 Time: 3:00pm Location: [address] Dayroom  Group Topic: Communication, Team Building, Problem Solving  Goal Area(s) Addresses:  Patient will be able to recognize use of communication, team building and problem solving during course of group activity. Patient will verbalize need for communication, team building and problem solving to make group activity successful.  Patient will verbalize benefit of communication, team building and problem solving to post d/c goals.   Behavioral Response: Engaged, Appropriate,   Intervention: Team Building Exercise  Activity: [employer]. Patients were given the following supplied: 5 rubber bands, 5 paper clips, 3 index cards, 5 drinking straws and a length of masking tape. In teams of 3, using the provided materials patients were asked to build a structure that could launch a ping pong ball approximately 10 feet.   Education: Customer service manager, Discharge Planning, Relapse Prevention  Education Outcome: Needs additional education.   Clinical Observations/Feedback: Patient actively participated in activity, building teams launching mechanism. Patient made no contributions to opening or wrap up discussion, however he appeared to actively listen as he maintained appropriate eye contact with speaker.   Marykay Lex Irving Lubbers, LRT/CTRS  Jearl Klinefelter 01/15/2013 5:17 PM

## 2013-01-15 NOTE — Tx Team (Signed)
Interdisciplinary Treatment Plan Update (Adult)  Date: 01/15/2013  Time Reviewed:  9:45 AM  Progress in Treatment: Attending groups: Yes Participating in groups:  Yes Taking medication as prescribed:  Yes Tolerating medication:  Yes Family/Significant othe contact made: No, pt refused Patient understands diagnosis:  Yes Discussing patient identified problems/goals with staff:  Yes Medical problems stabilized or resolved:  Yes Denies suicidal/homicidal ideation: Yes Issues/concerns per patient self-inventory:  Yes Other:  New problem(s) identified: N/A  Discharge Plan or Barriers: Pt will follow up at [facility] for medication management and therapy and [facility] for further treatment.   Reason for Continuation of Hospitalization: Anxiety Depression Suicidal Ideation Medication Stabilization  Comments: N/A  Estimated length of stay: 1 day, d/c tomorrow  For review of initial/current patient goals, please see plan of care.  Attendees: Patient:     Family:     Physician:  Dr. Javier Glazier 01/15/2013 9:56 AM   Nursing:   Harold Barban, RN 01/15/2013 9:56 AM   Clinical Social Worker:  Reyes Ivan, LCSWA 01/15/2013 9:56 AM   Other: Verne Spurr, PA 01/15/2013 9:56 AM   Other:  Frankey Shown, MA care coordination 01/15/2013 9:56 AM   Other:  Juline Patch, LCSW 01/15/2013 9:56 AM   Other:  Chinita Greenland, RN 01/15/2013 9:56 AM  Other:    Other:    Other:    Other:    Other:    Other:     Scribe for Treatment Team:   Carmina Miller, 01/15/2013 9:56 AM

## 2013-01-15 NOTE — Progress Notes (Signed)
D: Patient in the dayroom on approach.  Patient brighter today and interacting with peers.  Patient being silly and has to be redirected but redirectable.  Patient states he is supposed to go to [facility] Thursday or Friday.  Patient states he feels ready to go to [facility].   Patient denies SI/HI and denies AVH.   A: Staff to monitor Q 15 mins for safety.  Encouragement and support offered.  Scheduled medications administered per orders. R: Patient remains safe on the unit.  Patient attended group tonight.  Patient visible on the unit and interacting with peers.  Patient cooperative and taking administered medications.

## 2013-01-15 NOTE — [facility] Group Notes (Signed)
[facility] LCSW Group Therapy  01/15/2013  1:15 PM   Type of Therapy:  Group Therapy  Participation Level:  Active  Participation Quality:  Appropriate and Attentive  Affect:  Appropriate, Depressed and Flat  Cognitive:  Alert and Appropriate  Insight:  Developing/Improving and Engaged  Engagement in Therapy:  Developing/Improving and Engaged  Modes of Intervention:  Clarification, Confrontation, Discussion, Education, Exploration, Limit-setting, Orientation, Problem-solving, Rapport Building, Dance movement psychotherapist, Socialization and Support  Summary of Progress/Problems: The topic for group today was emotional regulation.  This group focused on both positive and negative emotion identification and allowed group members to process ways to identify feelings, regulate negative emotions, and find healthy ways to manage internal/external emotions. Group members were asked to reflect on a time when their reaction to an emotion led to a negative outcome and explored how alternative responses using emotion regulation would have benefited them. Group members were also asked to discuss a time when emotion regulation was utilized when a negative emotion was experienced. Pt appeared to be actively listening to group discussion but did not have anything to share in group discussion.    Reyes Ivan, LCSWA 01/15/2013 2:42 PM

## 2013-01-15 NOTE — Progress Notes (Signed)
Adult Psychoeducational Group Note  Date:  01/15/2013 Time:  9:03 PM  Group Topic/Focus:  Wrap-Up Group:   The focus of this group is to help patients review their daily goal of treatment and discuss progress on daily workbooks.  Participation Level:  Active  Participation Quality:  Appropriate  Affect:  Appropriate  Cognitive:  Appropriate  Insight: Improving  Engagement in Group:  Improving  Modes of Intervention:  Exploration  Additional Comments:  Pt stated that one positive thing is that he learned he will be going to [facility]. Pt stated that one thing that he learned from his personal development group is that he wants to quit drinking but stated that he doesn't know of any coping skills at this time.  Auriana Scalia, Randal Buba 01/15/2013, 9:03 PM

## 2013-01-16 DIAGNOSIS — F1010 Alcohol abuse, uncomplicated: Secondary | ICD-10-CM

## 2013-01-16 DIAGNOSIS — F329 Major depressive disorder, single episode, unspecified: Secondary | ICD-10-CM

## 2013-01-16 DIAGNOSIS — F1910 Other psychoactive substance abuse, uncomplicated: Secondary | ICD-10-CM

## 2013-01-16 MED ORDER — GABAPENTIN 300 MG PO CAPS: 300.0000 mg | ORAL_CAPSULE | Freq: Three times a day (TID) | ORAL | Status: DC

## 2013-01-16 MED ORDER — QUETIAPINE FUMARATE 100 MG PO TABS: 100.0000 mg | ORAL_TABLET | Freq: Every day | ORAL | Status: DC

## 2013-01-16 MED ORDER — PAROXETINE HCL 20 MG PO TABS: 20.0000 mg | ORAL_TABLET | ORAL | Status: DC

## 2013-01-16 MED ORDER — DULOXETINE HCL 60 MG PO CPEP: 60.0000 mg | ORAL_CAPSULE | Freq: Every day | ORAL | Status: DC

## 2013-01-16 MED ORDER — TRAZODONE HCL 100 MG PO TABS: 100.0000 mg | ORAL_TABLET | Freq: Every day | ORAL | Status: DC

## 2013-01-16 NOTE — Discharge Summary (Signed)
Physician Discharge Summary Note  Patient:  [name] [name] is an 32 y.o., male MRN:  [id number] DOB:  [birth year] Patient phone:  [phone] (home)  Patient address:   [address] [place] [address],   Date of Admission:  01/11/2013 Date of Discharge:  01/16/2013   Reason for Admission: Depression and Alcohol abuse  Discharge Diagnoses: Principal Problem:   Depression, psychotic Active Problems:   Depression   Alcohol abuse  Review of Systems  Constitutional: Negative.  Negative for fever, chills, weight loss, malaise/fatigue and diaphoresis.  HENT: Negative for congestion and sore throat.   Eyes: Negative for blurred vision, double vision and photophobia.  Respiratory: Negative for cough, shortness of breath and wheezing.   Cardiovascular: Negative for chest pain, palpitations and PND.  Gastrointestinal: Negative for heartburn, nausea, vomiting, abdominal pain, diarrhea and constipation.  Musculoskeletal: Negative for myalgias, joint pain and falls.  Neurological: Negative for dizziness, tingling, tremors, sensory change, speech change, focal weakness, seizures, loss of consciousness, weakness and headaches.  Endo/Heme/Allergies: Negative for polydipsia. Does not bruise/bleed easily.  Psychiatric/Behavioral: Negative for depression, suicidal ideas, hallucinations, memory loss and substance abuse. The patient is not nervous/anxious and does not have insomnia.   Discharge Diagnoses:  AXIS I: Depressive Disorder NOS, Substance Induced Mood Disorder and Alcohol abuse versus dependence  AXIS II: Personality Disorder NOS  AXIS III:  Past Medical History   Diagnosis  Date   .  Mental disorder    .  Hypertension    .  Depression    .  Bipolar 1 disorder    .  Irregular heart rate    .  Asthma     AXIS IV: economic problems, housing problems, occupational problems, other psychosocial or environmental problems and problems related to social environment  AXIS  V: 61-70 mild symptoms  Level of Care: OP  Hospital Course: [name] [name] presented to the ED reporting a sore ankle and hearing voices. He also noted that he had been drinking a 12pk of beer each day plus 5 shots of white liquor for the past 12 weeks.  He was tested and found to have a BAL <11, but was felt to be in need of acute psychiatric hospitalization for stabilization.      [name] [name] was admitted to the unit and evaluated. The symptoms were identified as depression, anxiety, mood swings,excessive worry and reported auditory hallucinations.      [name] [name] is well known to the [facility] staff as he has had previous admissions to the adult unit. The patient was oriented to the unit and encouraged to participate in unit programming. Medical problems were identified and treated appropriately. Home medication was restarted as needed. Psychiatric medication management was initiated.       He was evaluated each day by a clinical provider to ascertain the patient's response to treatment.  Improvement was noted by the patient's report of decreasing symptoms, improved sleep and appetite, affect, medication tolerance, behavior, and participation in unit programming.  [name] [name] was asked each day to complete a self inventory noting mood, mental status, pain, new symptoms, anxiety and concerns.        The patient responded well to medication and being in a therapeutic and supportive environment. Positive and appropriate behavior was noted and the patient was motivated for recovery. He did not show any signs of alcohol withdrawal. He worked closely with the treatment team and case manager to develop a discharge plan with appropriate goals. Coping skills, problem solving as well  as relaxation therapies were also part of the unit programming.         By the day of discharge the patient was in much improved condition than upon admission.  Symptoms were reported as significantly decreased or resolved completely.  The  patient denied SI/HI and voiced no AVH. He was motivated to continue taking medication with a goal of continued improvement in mental health.  He was discharged to [facility]  with a plan to follow up at [facility] noted below.  Consults:  None  Significant Diagnostic Studies:  None  Discharge Vitals:   Blood pressure 116/82, pulse 86, temperature 97.8 F (36.6 C), temperature source Oral, resp. rate 20, height 6\' 1"  (1.854 m), weight 127.461 kg (281 lb). Body mass index is 37.08 kg/(m^2). Lab Results:   No results found for this or any previous visit (from the past 72 hour(s)).  Physical Findings: AIMS: Facial and Oral Movements Muscles of Facial Expression: None, normal Lips and Perioral Area: None, normal Jaw: None, normal Tongue: None, normal,Extremity Movements Upper (arms, wrists, hands, fingers): None, normal Lower (legs, knees, ankles, toes): None, normal, Trunk Movements Neck, shoulders, hips: None, normal, Overall Severity Severity of abnormal movements (highest score from questions above): None, normal Incapacitation due to abnormal movements: None, normal Patient's awareness of abnormal movements (rate only patient's report): No Awareness, Dental Status Current problems with teeth and/or dentures?: No Does patient usually wear dentures?: No  CIWA:  CIWA-Ar Total: 0 COWS:  COWS Total Score: 0  Psychiatric Specialty Exam: See Psychiatric Specialty Exam and Suicide Risk Assessment completed by Attending Physician prior to discharge.  Discharge destination:  Home  Is patient on multiple antipsychotic therapies at discharge:  No   Has Patient had three or more failed trials of antipsychotic monotherapy by history:  No  Recommended Plan for Multiple Antipsychotic Therapies: NA  Discharge Orders   Future Orders Complete By Expires   Diet - low sodium heart healthy  As directed    Discharge instructions  As directed    Comments:     Take all of your  medications as directed. Be sure to keep all of your follow up appointments.  If you are unable to keep your follow up appointment, call your Doctor's office to let them know, and reschedule.  Make sure that you have enough medication to last until your appointment. Be sure to get plenty of rest. Going to bed at the same time each night will help. Try to avoid sleeping during the day.  Increase your activity as tolerated. Regular exercise will help you to sleep better and improve your mental health. Eating a heart healthy diet is recommended. Try to avoid salty or fried foods. Be sure to avoid all alcohol and illegal drugs.   Increase activity slowly  As directed        Medication List    STOP taking these medications       acetaminophen 500 MG tablet  Commonly known as:  TYLENOL      TAKE these medications     Indication   albuterol 108 (90 BASE) MCG/ACT inhaler  Commonly known as:  PROVENTIL HFA;VENTOLIN HFA  Inhale 2 puffs into the lungs 2 (two) times daily as needed for wheezing or shortness of breath.      DULoxetine 60 MG capsule  Commonly known as:  CYMBALTA  Take 1 capsule (60 mg total) by mouth daily.   Indication:  Major Depressive Disorder     gabapentin  300 MG capsule  Commonly known as:  NEURONTIN  Take 1 capsule (300 mg total) by mouth 3 (three) times daily.   Indication:  Agitation     PARoxetine 20 MG tablet  Commonly known as:  PAXIL  Take 1 tablet (20 mg total) by mouth every morning.   Indication:  Generalized Anxiety Disorder, Major Depressive Disorder     QUEtiapine 100 MG tablet  Commonly known as:  SEROQUEL  Take 1 tablet (100 mg total) by mouth at bedtime.   Indication:  Depressive Phase of Manic-Depression, Trouble Sleeping     traZODone 100 MG tablet  Commonly known as:  DESYREL  Take 1 tablet (100 mg total) by mouth at bedtime.   Indication:  Trouble Sleeping           Follow-up Information   Follow up with [facility] On 01/17/2013. (Walk in  on this date for hospital discharge appointment.  Walk in clinic is Monday - Friday 8 am - 3 pm. )    Contact information:   [address], [place] [address] Phone: [phone] Fax: [phone]      Follow up with [facility] On 01/28/2013. (Arrive at 8:00 am promptly for assessment and possible admission, per [place].  Bring ID and 30 day supply of medication)    Contact information:   [address] [place], [place] [address] Phone: [phone] Fax: [phone]      Follow-up recommendations:   Activities: Resume activity as tolerated. Diet: Heart healthy low sodium diet Tests: Follow up testing will be determined by your out patient provider. Comments:    Total Discharge Time:  Less than 30 minutes.  Signed: MASHBURN,NEIL 01/16/2013, 10:23 AM  Reviewed the information documented and agree with the treatment plan.  Jabes Primo,[name]. 01/19/2013 1:36 PM

## 2013-01-16 NOTE — Progress Notes (Signed)
[facility] Adult Case Management Discharge Plan :  Will you be returning to the same living situation after discharge: No. Pt going to [facility] upon d/c At discharge, do you have transportation home?:Yes,  provided pt with a bus pass Do you have the ability to pay for your medications:Yes,  access to meds  Release of information consent forms completed and in the chart;  Patient's signature needed at discharge.  Patient to Follow up at: Follow-up Information   Follow up with [facility] On 01/17/2013. (Walk in on this date for hospital discharge appointment.  Walk in clinic is Monday - Friday 8 am - 3 pm. )    Contact information:   [address], [place] [address] Phone: [phone] Fax: [phone]      Follow up with [facility] On 01/28/2013. (Arrive at 8:00 am promptly for assessment and possible admission, per [place].  Bring ID and 30 day supply of medication)    Contact information:   [address] [place], [place] [address] Phone: [phone] Fax: [phone]      Patient denies SI/HI:   Yes,  denies SI/HI\    Safety Planning and Suicide Prevention discussed:  Yes,  discussed with pt.  Pt refused contact with family/friends.  See suicide prevention education note.    Pt verbalized that he is excited about his d/c plans and is planning to follow through this time.  Pt states that the friends he was staying with were a negative influence and feels staying at the shelter until going to [facility] is a good plan for him, to stay safe, well and sober.  No further needs voiced by pt at this time.    Carmina Miller 01/16/2013, 12:05 PM

## 2013-01-16 NOTE — Progress Notes (Signed)
Adult Psychoeducational Group Note  Date:  01/16/2013 Time:  9:00 AM  Group Topic/Focus:  Self Esteem Action Plan:   The focus of this group is to help patients create a plan to continue to build self-esteem after discharge.  Participation Level:  None  Participation Quality:  Drowsy and Inattentive  Affect:  Flat  Cognitive:  Appropriate  Insight: None  Engagement in Group:  None  Modes of Intervention:  Discussion  Additional Comments:  Pt. was sleeping during group.  Harold Barban E 01/16/2013, 9:58 AM

## 2013-01-16 NOTE — Progress Notes (Signed)
Patient ID: [name] [name], male   DOB: [birth year], 32 y.o.   MRN: [id number] Pt did not attend group.

## 2013-01-16 NOTE — [facility] Suicide Risk Assessment (Signed)
Suicide Risk Assessment  Discharge Assessment     Demographic Factors:  Male, Adolescent or young adult, Caucasian, Low socioeconomic status and Unemployed  Mental Status Per Nursing Assessment::   On Admission:  Suicidal ideation indicated by patient  Current Mental Status by Physician: Mental Status Examination: Patient appeared as per his stated age, casually dressed, and fairly groomed, and maintaining good eye contact. Patient has good mood and his affect was constricted. He has normal rate, rhythm, and volume of speech. His thought process is linear and goal directed. Patient has denied suicidal, homicidal ideations, intentions or plans. Patient has no evidence of auditory or visual hallucinations, delusions, and paranoia. Patient has fair insight judgment and impulse control.  Loss Factors: Financial problems/change in socioeconomic status  Historical Factors: Prior suicide attempts, Family history of mental illness or substance abuse and Impulsivity  Risk Reduction Factors:   Sense of responsibility to family, Religious beliefs about death, Positive social support, Positive therapeutic relationship and Positive coping skills or problem solving skills  Continued Clinical Symptoms:  Depression:   Comorbid alcohol abuse/dependence Impulsivity Recent sense of peace/wellbeing Alcohol/Substance Abuse/Dependencies Previous Psychiatric Diagnoses and Treatments Medical Diagnoses and Treatments/Surgeries  Cognitive Features That Contribute To Risk:  Polarized thinking    Suicide Risk:  Minimal: No identifiable suicidal ideation.  Patients presenting with no risk factors but with morbid ruminations; may be classified as minimal risk based on the severity of the depressive symptoms  Discharge Diagnoses:   AXIS I:  Depressive Disorder NOS, Substance Induced Mood Disorder and Alcohol abuse versus dependence AXIS II:  Personality Disorder NOS AXIS III:   Past Medical History   Diagnosis Date  . Mental disorder   . Hypertension   . Depression   . Bipolar 1 disorder   . Irregular heart rate   . Asthma    AXIS IV:  economic problems, housing problems, occupational problems, other psychosocial or environmental problems and problems related to social environment AXIS V:  61-70 mild symptoms  Plan Of Care/Follow-up recommendations:  Activity:  As tolerated Diet:  Regular  Is patient on multiple antipsychotic therapies at discharge:  No   Has Patient had three or more failed trials of antipsychotic monotherapy by history:  No  Recommended Plan for Multiple Antipsychotic Therapies: Not applicable  Nehemiah Settle., M.D. 01/16/2013, 11:32 AM

## 2013-01-16 NOTE — Progress Notes (Signed)
Discharge Note: Discharge instructions/prescriptions/medication samples and bus pass given to patient. Patient verbalized understanding of discharge instructions and prescriptions. Returned belongings to patient. Denies SI/HI/AVH. Patient d/c without incident to the front lobby and transported home by [employer] city bus.

## 2013-01-19 ENCOUNTER — Emergency Department (HOSPITAL_COMMUNITY)
Admission: EM | Admit: 2013-01-19 | Discharge: 2013-01-19 | Disposition: A | Discharge status: Home / Self Care | Payer: Self-pay | Attending: Emergency Medicine | Admitting: Emergency Medicine

## 2013-01-19 ENCOUNTER — Encounter (HOSPITAL_COMMUNITY): Payer: Self-pay | Admitting: *Deleted

## 2013-01-19 ENCOUNTER — Emergency Department (HOSPITAL_COMMUNITY): Payer: Self-pay

## 2013-01-19 DIAGNOSIS — F319 Bipolar disorder, unspecified: Secondary | ICD-10-CM | POA: Insufficient documentation

## 2013-01-19 DIAGNOSIS — F17200 Nicotine dependence, unspecified, uncomplicated: Secondary | ICD-10-CM | POA: Insufficient documentation

## 2013-01-19 DIAGNOSIS — Y9389 Activity, other specified: Secondary | ICD-10-CM | POA: Insufficient documentation

## 2013-01-19 DIAGNOSIS — R1110 Vomiting, unspecified: Secondary | ICD-10-CM | POA: Insufficient documentation

## 2013-01-19 DIAGNOSIS — I10 Essential (primary) hypertension: Secondary | ICD-10-CM | POA: Insufficient documentation

## 2013-01-19 DIAGNOSIS — Z79899 Other long term (current) drug therapy: Secondary | ICD-10-CM | POA: Insufficient documentation

## 2013-01-19 DIAGNOSIS — W1809XA Striking against other object with subsequent fall, initial encounter: Secondary | ICD-10-CM | POA: Insufficient documentation

## 2013-01-19 DIAGNOSIS — R197 Diarrhea, unspecified: Secondary | ICD-10-CM | POA: Insufficient documentation

## 2013-01-19 DIAGNOSIS — J45909 Unspecified asthma, uncomplicated: Secondary | ICD-10-CM | POA: Insufficient documentation

## 2013-01-19 DIAGNOSIS — M79642 Pain in left hand: Secondary | ICD-10-CM

## 2013-01-19 DIAGNOSIS — Y929 Unspecified place or not applicable: Secondary | ICD-10-CM | POA: Insufficient documentation

## 2013-01-19 DIAGNOSIS — Z8659 Personal history of other mental and behavioral disorders: Secondary | ICD-10-CM | POA: Insufficient documentation

## 2013-01-19 DIAGNOSIS — S6990XA Unspecified injury of unspecified wrist, hand and finger(s), initial encounter: Secondary | ICD-10-CM | POA: Insufficient documentation

## 2013-01-19 DIAGNOSIS — R55 Syncope and collapse: Secondary | ICD-10-CM | POA: Insufficient documentation

## 2013-01-19 LAB — POCT I-STAT, CHEM 8
BUN: 13 mg/dL (ref 6–23)
Calcium, Ion: 1.23 mmol/L (ref 1.12–1.23)
Chloride: 104 mEq/L (ref 96–112)
Creatinine, Ser: 0.9 mg/dL (ref 0.50–1.35)
Glucose, Bld: 89 mg/dL (ref 70–99)
HCT: 44 % (ref 39.0–52.0)

## 2013-01-19 MED ORDER — ONDANSETRON 4 MG PO TBDP
4.0000 mg | ORAL_TABLET | Freq: Once | ORAL | Status: AC
  Administered 2013-01-19: 4 mg via ORAL
  Filled 2013-01-19: qty 1

## 2013-01-19 MED ORDER — ONDANSETRON HCL 4 MG PO TABS: 4.0000 mg | ORAL_TABLET | Freq: Four times a day (QID) | ORAL | Status: DC

## 2013-01-19 MED ORDER — OXYCODONE-ACETAMINOPHEN 5-325 MG PO TABS: 2.0000 | ORAL_TABLET | ORAL | Status: DC | PRN

## 2013-01-19 NOTE — ED Provider Notes (Signed)
Date: 01/19/2013   Rate:69  Rhythm: normal sinus rhythm  QRS Axis: normal  Intervals: normal  ST/T Wave abnormalities: normal  Conduction Disutrbances: none  Narrative Interpretation: Borderline intraventricular conduction delay, LVH, no delta wave, no Brugada, no ischemic changes, unchanged compared to prior EKG on 07/21/2012     Layla Maw Ariel Wingrove, DO 01/19/13 1400

## 2013-01-19 NOTE — ED Provider Notes (Signed)
. TIME SEEN: 12:26 PM  CHIEF COMPLAINT: Nausea, vomiting, diarrhea, dizziness, fall  HPI: Patient is a 32 y.o. white male with a history of psychiatric disorder and hypertension who presents emergency department with one day of several episodes of vomiting and diarrhea. He states that he has had a small amount of blood-streaked and some of his vomit today but no bloody stool or melena. No coffee-ground emesis. He reports that he became dizzy with standing and had a syncopal event and fell but caught himself with his left TM before he hit his head. Denied any chest pain or shortness of breath. No palpitations. No fever or chills. No known sick contacts. No bad food exposures. A recent travel. No recent antibiotic use.  ROS: See HPI Constitutional: no fever  Eyes: no drainage  ENT: no runny nose   Cardiovascular:  no chest pain  Resp: no SOB  GI: no vomiting GU: no dysuria Integumentary: no rash  Allergy: no hives  Musculoskeletal: no leg swelling  Neurological: no slurred speech ROS otherwise negative  PAST MEDICAL HISTORY/PAST SURGICAL HISTORY:  Past Medical History  Diagnosis Date  . Mental disorder   . Hypertension   . Depression   . Bipolar 1 disorder   . Irregular heart rate   . Asthma     MEDICATIONS:  Prior to Admission medications   Medication Sig Start Date End Date Taking? Authorizing Provider  albuterol (PROVENTIL HFA;VENTOLIN HFA) 108 (90 BASE) MCG/ACT inhaler Inhale 2 puffs into the lungs 2 (two) times daily as needed for wheezing or shortness of breath.    Yes Historical Provider, MD  DULoxetine (CYMBALTA) 60 MG capsule Take 1 capsule (60 mg total) by mouth daily. 01/16/13  Yes Verne Spurr, PA-C  gabapentin (NEURONTIN) 300 MG capsule Take 1 capsule (300 mg total) by mouth 3 (three) times daily. 01/16/13  Yes Verne Spurr, PA-C  PARoxetine (PAXIL) 20 MG tablet Take 1 tablet (20 mg total) by mouth every morning. 01/16/13  Yes Verne Spurr, PA-C  QUEtiapine  (SEROQUEL) 100 MG tablet Take 1 tablet (100 mg total) by mouth at bedtime. 01/16/13  Yes Verne Spurr, PA-C  traZODone (DESYREL) 100 MG tablet Take 1 tablet (100 mg total) by mouth at bedtime. 01/16/13  Yes Verne Spurr, PA-C    ALLERGIES:  No Known Allergies  SOCIAL HISTORY:  History  Substance Use Topics  . Smoking status: Current Every Day Smoker -- 0.50 packs/day  . Smokeless tobacco: Never Used  . Alcohol Use: 0.0 oz/week     Comment: pt states he has been drinking 12 beers and 6 shots daily for several weeks    FAMILY HISTORY: Family History  Problem Relation Age of Onset  . Hypertension Mother   . Hypertension Other     EXAM: BP 116/72  Pulse 80  Temp(Src) 98.2 F (36.8 C) (Oral)  Resp 20  SpO2 96% CONSTITUTIONAL: Alert and oriented and responds appropriately to questions. Well-appearing; well-nourished HEAD: Normocephalic EYES: Conjunctivae clear, PERRL ENT: normal nose; no rhinorrhea; very slightly dry mucous membranes; pharynx without lesions noted NECK: Supple, no meningismus, no LAD  CARD: RRR; S1 and S2 appreciated; no murmurs, no clicks, no rubs, no gallops RESP: Normal chest excursion without splinting or tachypnea; breath sounds clear and equal bilaterally; no wheezes, no rhonchi, no rales,  ABD/GI: Normal bowel sounds; non-distended; soft, non-tender, no rebound, no guarding BACK:  The back appears normal and is non-tender to palpation, there is no CVA tenderness EXT: Tenderness palpation over the dorsal  hand diffusely with no bony deformities, no swelling or ecchymosis, 2+ radial and DP pulses bilaterally, Normal ROM in all joints; otherwise extremities are non-tender to palpation; no edema; normal capillary refill; no cyanosis    SKIN: Normal color for age and race; warm NEURO: Moves all extremities equally, cranial nerves II through XII grossly intact, sensation to light touch intact diffusely PSYCH: The patient's mood and manner are appropriate. Grooming  and personal hygiene are appropriate.  MEDICAL DECISION MAKING: Patient with likely viral gastritis with nausea and vomiting times one day with a small amount of blood in his vomit likely due to irritation of the esophagus, Mallory-Weiss tears. His abdominal exam is completely benign. His vital signs are stable. He is well-appearing and smiling and laughing in the room. His electrolytes and hemoglobin are within normal limits. We'll obtain EKG given syncopal event although likely due to very mild dehydration. Patient reports a flexion injury of his left-hand with no obvious trauma on exam and a negative x-ray. We'll give pain medication for his left-hand injury and oral Zofran. We'll by mouth challenge. Anticipate discharge home. Patient is comfortable this plan and verbalizes understanding.  ED PROGRESS:  Patient has been able to tolerate by mouth. EKG shows no ischemic changes, Brugada, deltoids. We'll discharge home with outpatient followup, return precautions. Patient verbalizes understanding is comfortable with plan.  Layla Maw Exavier Lina, DO 01/19/13 1349

## 2013-01-19 NOTE — ED Notes (Signed)
Multiple complaints: reports being really sick recently and having n/v, dizziness, vomiting blood and + syncopal episode this am and fell and injured left hand. No acute distress noted at this time.

## 2013-01-20 NOTE — Progress Notes (Signed)
Patient Discharge Instructions:  After Visit Summary (AVS):   Faxed to:  8/18 Discharge Summary Note:   Faxed to:  01/20/17 Psychiatric Admission Assessment Note:   Faxed to:  01/20/13 Suicide Risk Assessment - Discharge Assessment:   Faxed to:  01/20/13  Tonna Corner, 01/20/2013, 11:37 AM  Faxed to [facility] at [phone]

## 2013-01-20 NOTE — Progress Notes (Signed)
Patient Discharge Instructions:  After Visit Summary (AVS):   Faxed to:  01/20/13 Discharge Summary Note:   Faxed to:  01/20/13 Psychiatric Admission Assessment Note:   Faxed to:  01/20/13 Suicide Risk Assessment - Discharge Assessment:   Faxed to:  01/20/13  Tonna Corner, 01/20/2013, 11:21 AM

## 2013-01-22 ENCOUNTER — Emergency Department (HOSPITAL_COMMUNITY): Payer: Self-pay

## 2013-01-22 ENCOUNTER — Encounter (HOSPITAL_COMMUNITY): Payer: Self-pay | Admitting: Emergency Medicine

## 2013-01-22 ENCOUNTER — Emergency Department (HOSPITAL_COMMUNITY)
Admission: EM | Admit: 2013-01-22 | Discharge: 2013-01-22 | Disposition: A | Discharge status: Home / Self Care | Payer: Self-pay | Attending: Emergency Medicine | Admitting: Emergency Medicine

## 2013-01-22 DIAGNOSIS — Y9289 Other specified places as the place of occurrence of the external cause: Secondary | ICD-10-CM | POA: Insufficient documentation

## 2013-01-22 DIAGNOSIS — Z8659 Personal history of other mental and behavioral disorders: Secondary | ICD-10-CM | POA: Insufficient documentation

## 2013-01-22 DIAGNOSIS — Y939 Activity, unspecified: Secondary | ICD-10-CM | POA: Insufficient documentation

## 2013-01-22 DIAGNOSIS — F319 Bipolar disorder, unspecified: Secondary | ICD-10-CM | POA: Insufficient documentation

## 2013-01-22 DIAGNOSIS — IMO0002 Reserved for concepts with insufficient information to code with codable children: Secondary | ICD-10-CM | POA: Insufficient documentation

## 2013-01-22 DIAGNOSIS — F17200 Nicotine dependence, unspecified, uncomplicated: Secondary | ICD-10-CM | POA: Insufficient documentation

## 2013-01-22 DIAGNOSIS — S93609A Unspecified sprain of unspecified foot, initial encounter: Secondary | ICD-10-CM | POA: Insufficient documentation

## 2013-01-22 DIAGNOSIS — W19XXXA Unspecified fall, initial encounter: Secondary | ICD-10-CM | POA: Insufficient documentation

## 2013-01-22 DIAGNOSIS — S93601A Unspecified sprain of right foot, initial encounter: Secondary | ICD-10-CM

## 2013-01-22 DIAGNOSIS — J45909 Unspecified asthma, uncomplicated: Secondary | ICD-10-CM | POA: Insufficient documentation

## 2013-01-22 DIAGNOSIS — I10 Essential (primary) hypertension: Secondary | ICD-10-CM | POA: Insufficient documentation

## 2013-01-22 MED ORDER — IBUPROFEN 800 MG PO TABS: 800.0000 mg | ORAL_TABLET | Freq: Three times a day (TID) | ORAL | Status: DC

## 2013-01-22 MED ORDER — ACETAMINOPHEN 500 MG PO TABS: 500.0000 mg | ORAL_TABLET | Freq: Four times a day (QID) | ORAL | Status: DC | PRN

## 2013-01-22 NOTE — ED Provider Notes (Signed)
CSN: [id number]     Arrival date & time 01/22/13  6295 History     First MD Initiated Contact with Patient 01/22/13 [phone]     Chief Complaint  Patient presents with  . Foot Pain   (Consider location/radiation/quality/duration/timing/severity/associated sxs/prior Treatment) HPI Pt is a 32yo male c/o right foot pain and swelling that started Sunday, 8/17 after falling on his porch.  Pt was evaluated in ED for same that day but only c/o left wrist pain at that time.  Denies new injury. States since then, he has had increased swelling and pain in right foot.  Pain is constant, 8/10, aching, throbbing, difficult to bear weight due to pain.  Taking tylenol with minimal relief.  Reports scrape on top of foot from the fall. No warmth, increased redness, fever, or n/v.  Denies hx of DM.  Past Medical History  Diagnosis Date  . Mental disorder   . Hypertension   . Depression   . Bipolar 1 disorder   . Irregular heart rate   . Asthma    History reviewed. No pertinent past surgical history. Family History  Problem Relation Age of Onset  . Hypertension Mother   . Hypertension Other    History  Substance Use Topics  . Smoking status: Current Every Day Smoker -- 0.50 packs/day  . Smokeless tobacco: Never Used  . Alcohol Use: 0.0 oz/week     Comment: pt states he has been drinking 12 beers and 6 shots daily for several weeks    Review of Systems  Musculoskeletal: Positive for myalgias, joint swelling and arthralgias.       Right foot  Skin: Positive for wound.  All other systems reviewed and are negative.    Allergies  Review of patient's allergies indicates no known allergies.  Home Medications   Current Outpatient Rx  Name  Route  Sig  Dispense  Refill  . albuterol (PROVENTIL HFA;VENTOLIN HFA) 108 (90 BASE) MCG/ACT inhaler   Inhalation   Inhale 2 puffs into the lungs 2 (two) times daily as needed for wheezing or shortness of breath.          . DULoxetine (CYMBALTA) 60 MG  capsule   Oral   Take 1 capsule (60 mg total) by mouth daily.   30 capsule   0   . gabapentin (NEURONTIN) 300 MG capsule   Oral   Take 1 capsule (300 mg total) by mouth 3 (three) times daily.   90 capsule   0   . ondansetron (ZOFRAN) 4 MG tablet   Oral   Take 1 tablet (4 mg total) by mouth every 6 (six) hours.   12 tablet   0   . oxyCODONE-acetaminophen (PERCOCET/ROXICET) 5-325 MG per tablet   Oral   Take 2 tablets by mouth every 4 (four) hours as needed for pain.   15 tablet   0   . PARoxetine (PAXIL) 20 MG tablet   Oral   Take 1 tablet (20 mg total) by mouth every morning.   30 tablet   0   . QUEtiapine (SEROQUEL) 100 MG tablet   Oral   Take 1 tablet (100 mg total) by mouth at bedtime.   30 tablet   0   . traZODone (DESYREL) 100 MG tablet   Oral   Take 1 tablet (100 mg total) by mouth at bedtime.   30 tablet   0   . acetaminophen (TYLENOL) 500 MG tablet   Oral   Take 1 tablet (  500 mg total) by mouth every 6 (six) hours as needed for pain.   30 tablet   0    BP 125/70  Pulse 88  Temp(Src) 98.1 F (36.7 C) (Oral)  Resp 18  SpO2 99% Physical Exam  Nursing note and vitals reviewed. Constitutional: He appears well-developed and well-nourished.  HENT:  Head: Normocephalic and atraumatic.  Eyes: Conjunctivae and EOM are normal. No scleral icterus.  Neck: Normal range of motion.  Cardiovascular: Normal rate.   Pulmonary/Chest: Effort normal.  Musculoskeletal: He exhibits edema and tenderness.       Feet:  Mild to moderate edema of right foot.  TTP over dorsum of foot and plantar aspect mid-foot.  4/5 plantar and dorsiflexion due to pain. Sharp and light touch in tact, pedal pulses 2+  Neurological: He is alert.  Skin: Skin is warm and dry.  Abrasion right foot    ED Course   Procedures (including critical care time)  Labs Reviewed - No data to display Dg Foot Complete Right  01/22/2013   *RADIOLOGY REPORT*  Clinical Data: Right foot swelling.   Blister on the dorsum of the foot.  RIGHT FOOT COMPLETE - 3+ VIEW  Comparison: None.  Findings: Imaged bones, joints and soft tissues appear normal.  IMPRESSION: Negative exam.   Original Report Authenticated By: Holley Dexter, M.D.   1. Right foot sprain, initial encounter     MDM  Fx vs sprain.  Will get plain films. Splint, crutches, pain meds.   Plain films: negative.  Rx: acetaminophen, crutches, ace-wrap.  Will discharge pt home and have her f/u with [facility] and [facility] info provided. Return precautions given. Pt verbalized understanding and agreement with tx plan. Vitals: unremarkable. Discharged in stable condition.      Junius Finner, PA-C 01/22/13 1034
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
## 2013-01-22 NOTE — ED Provider Notes (Signed)
Medical screening examination/treatment/procedure(s) were performed by non-physician practitioner and as supervising physician I was immediately available for consultation/collaboration.  Shon Baton, MD 01/22/13 [phone]

## 2013-01-27 ENCOUNTER — Emergency Department (HOSPITAL_COMMUNITY)
Admission: EM | Admit: 2013-01-27 | Discharge: 2013-01-27 | Discharge status: Against Medical Advice | Payer: Self-pay | Attending: Emergency Medicine | Admitting: Emergency Medicine

## 2013-01-27 ENCOUNTER — Encounter (HOSPITAL_COMMUNITY): Payer: Self-pay | Admitting: Emergency Medicine

## 2013-01-27 DIAGNOSIS — J45909 Unspecified asthma, uncomplicated: Secondary | ICD-10-CM | POA: Insufficient documentation

## 2013-01-27 DIAGNOSIS — F319 Bipolar disorder, unspecified: Secondary | ICD-10-CM | POA: Insufficient documentation

## 2013-01-27 DIAGNOSIS — F17200 Nicotine dependence, unspecified, uncomplicated: Secondary | ICD-10-CM | POA: Insufficient documentation

## 2013-01-27 DIAGNOSIS — Z79899 Other long term (current) drug therapy: Secondary | ICD-10-CM | POA: Insufficient documentation

## 2013-01-27 DIAGNOSIS — I10 Essential (primary) hypertension: Secondary | ICD-10-CM | POA: Insufficient documentation

## 2013-01-27 DIAGNOSIS — R1110 Vomiting, unspecified: Secondary | ICD-10-CM | POA: Insufficient documentation

## 2013-01-27 DIAGNOSIS — F489 Nonpsychotic mental disorder, unspecified: Secondary | ICD-10-CM | POA: Insufficient documentation

## 2013-01-27 DIAGNOSIS — I499 Cardiac arrhythmia, unspecified: Secondary | ICD-10-CM | POA: Insufficient documentation

## 2013-01-27 NOTE — ED Notes (Signed)
Was getting meds refilled and called ems because he had vomited has vomited 9 10 times 20  Left hand per ems 158/98 72 18 sat 98

## 2013-01-27 NOTE — ED Notes (Signed)
Pt not in waiting room after several attempts to call

## 2013-02-06 ENCOUNTER — Encounter (HOSPITAL_COMMUNITY): Payer: Self-pay | Admitting: Adult Health

## 2013-02-06 ENCOUNTER — Emergency Department (HOSPITAL_COMMUNITY): Payer: Self-pay

## 2013-02-06 ENCOUNTER — Emergency Department (HOSPITAL_COMMUNITY)
Admission: EM | Admit: 2013-02-06 | Discharge: 2013-02-06 | Disposition: A | Discharge status: Home / Self Care | Payer: Self-pay | Attending: Emergency Medicine | Admitting: Emergency Medicine

## 2013-02-06 DIAGNOSIS — J45909 Unspecified asthma, uncomplicated: Secondary | ICD-10-CM | POA: Insufficient documentation

## 2013-02-06 DIAGNOSIS — R05 Cough: Secondary | ICD-10-CM | POA: Insufficient documentation

## 2013-02-06 DIAGNOSIS — S20219A Contusion of unspecified front wall of thorax, initial encounter: Secondary | ICD-10-CM | POA: Insufficient documentation

## 2013-02-06 DIAGNOSIS — Z79899 Other long term (current) drug therapy: Secondary | ICD-10-CM | POA: Insufficient documentation

## 2013-02-06 DIAGNOSIS — R059 Cough, unspecified: Secondary | ICD-10-CM | POA: Insufficient documentation

## 2013-02-06 DIAGNOSIS — F319 Bipolar disorder, unspecified: Secondary | ICD-10-CM | POA: Insufficient documentation

## 2013-02-06 DIAGNOSIS — F17200 Nicotine dependence, unspecified, uncomplicated: Secondary | ICD-10-CM | POA: Insufficient documentation

## 2013-02-06 DIAGNOSIS — S20212A Contusion of left front wall of thorax, initial encounter: Secondary | ICD-10-CM

## 2013-02-06 DIAGNOSIS — Z8679 Personal history of other diseases of the circulatory system: Secondary | ICD-10-CM | POA: Insufficient documentation

## 2013-02-06 DIAGNOSIS — I10 Essential (primary) hypertension: Secondary | ICD-10-CM | POA: Insufficient documentation

## 2013-02-06 MED ORDER — IBUPROFEN 600 MG PO TABS: 600.0000 mg | ORAL_TABLET | Freq: Four times a day (QID) | ORAL | Status: DC | PRN
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
## 2013-02-07 NOTE — ED Provider Notes (Signed)
Medical screening examination/treatment/procedure(s) were performed by non-physician practitioner and as supervising physician I was immediately available for consultation/collaboration.  Flint Melter, MD 02/07/13 [phone]

## 2013-02-14 ENCOUNTER — Emergency Department (HOSPITAL_COMMUNITY)
Admission: EM | Admit: 2013-02-14 | Discharge: 2013-02-14 | Disposition: A | Discharge status: Home / Self Care | Payer: Self-pay | Attending: Emergency Medicine | Admitting: Emergency Medicine

## 2013-02-14 ENCOUNTER — Encounter (HOSPITAL_COMMUNITY): Payer: Self-pay | Admitting: *Deleted

## 2013-02-14 DIAGNOSIS — R6883 Chills (without fever): Secondary | ICD-10-CM | POA: Insufficient documentation

## 2013-02-14 DIAGNOSIS — F17200 Nicotine dependence, unspecified, uncomplicated: Secondary | ICD-10-CM | POA: Insufficient documentation

## 2013-02-14 DIAGNOSIS — J45909 Unspecified asthma, uncomplicated: Secondary | ICD-10-CM | POA: Insufficient documentation

## 2013-02-14 DIAGNOSIS — I10 Essential (primary) hypertension: Secondary | ICD-10-CM | POA: Insufficient documentation

## 2013-02-14 DIAGNOSIS — B9789 Other viral agents as the cause of diseases classified elsewhere: Secondary | ICD-10-CM | POA: Insufficient documentation

## 2013-02-14 DIAGNOSIS — B349 Viral infection, unspecified: Secondary | ICD-10-CM

## 2013-02-14 DIAGNOSIS — R112 Nausea with vomiting, unspecified: Secondary | ICD-10-CM | POA: Insufficient documentation

## 2013-02-14 DIAGNOSIS — R42 Dizziness and giddiness: Secondary | ICD-10-CM | POA: Insufficient documentation

## 2013-02-14 DIAGNOSIS — R5381 Other malaise: Secondary | ICD-10-CM | POA: Insufficient documentation

## 2013-02-14 DIAGNOSIS — F3130 Bipolar disorder, current episode depressed, mild or moderate severity, unspecified: Secondary | ICD-10-CM | POA: Insufficient documentation

## 2013-02-14 DIAGNOSIS — Z79899 Other long term (current) drug therapy: Secondary | ICD-10-CM | POA: Insufficient documentation

## 2013-02-14 LAB — CBC WITH DIFFERENTIAL/PLATELET
Basophils Absolute: 0 10*3/uL (ref 0.0–0.1)
Basophils Relative: 0 % (ref 0–1)
Eosinophils Absolute: 0.2 10*3/uL (ref 0.0–0.7)
Eosinophils Relative: 3 % (ref 0–5)
HCT: 40 % (ref 39.0–52.0)
MCH: 30.2 pg (ref 26.0–34.0)
MCHC: 34 g/dL (ref 30.0–36.0)
MCV: 88.9 fL (ref 78.0–100.0)
Monocytes Absolute: 0.7 10*3/uL (ref 0.1–1.0)
RDW: 12.9 % (ref 11.5–15.5)

## 2013-02-14 LAB — URINALYSIS, ROUTINE W REFLEX MICROSCOPIC
Glucose, UA: NEGATIVE mg/dL
Hgb urine dipstick: NEGATIVE
Leukocytes, UA: NEGATIVE
Specific Gravity, Urine: 1.029 (ref 1.005–1.030)
Urobilinogen, UA: 1 mg/dL (ref 0.0–1.0)

## 2013-02-14 LAB — COMPREHENSIVE METABOLIC PANEL
AST: 16 U/L (ref 0–37)
Albumin: 3.7 g/dL (ref 3.5–5.2)
BUN: 16 mg/dL (ref 6–23)
Calcium: 9.1 mg/dL (ref 8.4–10.5)
Creatinine, Ser: 0.78 mg/dL (ref 0.50–1.35)

## 2013-02-14 MED ORDER — ONDANSETRON HCL 4 MG/2ML IJ SOLN
4.0000 mg | Freq: Once | INTRAMUSCULAR | Status: AC
  Administered 2013-02-14: 4 mg via INTRAVENOUS
  Filled 2013-02-14: qty 2

## 2013-02-14 MED ORDER — SODIUM CHLORIDE 0.9 % IV BOLUS (SEPSIS)
1000.0000 mL | Freq: Once | INTRAVENOUS | Status: AC
  Administered 2013-02-14: 1000 mL via INTRAVENOUS

## 2013-02-14 MED ORDER — ONDANSETRON HCL 8 MG PO TABS: 8.0000 mg | ORAL_TABLET | ORAL | Status: DC | PRN

## 2013-02-14 NOTE — ED Provider Notes (Signed)
CSN: [id number]     Arrival date & time 02/14/13  0057 History   First MD Initiated Contact with Patient 02/14/13 0119     Chief Complaint  Patient presents with  . Nausea  . Emesis   HPI  History provided by the patient. Patient is a 32 year old male with history of hypertension, asthma, bipolar disorder who presents with complaints of nausea and vomiting. Patient states that he first began not feeling well early yesterday morning when he went to work. Later he became lightheaded and then had many episodes of nausea vomiting. [facility] and return they're to rest but had additional nausea vomiting and was encouraged to come to the emergency room for evaluation. He was transported by EMS. He denies any specific sick contacts. Denies any unusual foods. There has been no associated fever or diarrhea. He does report some chills and sweats. Denies any congestion, sore throat or cough symptoms. No headache or neck stiffness. No chest pain or shortness of breath. No other aggravating or alleviating factors. No other associated symptoms.    Past Medical History  Diagnosis Date  . Mental disorder   . Hypertension   . Depression   . Bipolar 1 disorder   . Irregular heart rate   . Asthma    History reviewed. No pertinent past surgical history. Family History  Problem Relation Age of Onset  . Hypertension Mother   . Hypertension Other    History  Substance Use Topics  . Smoking status: Current Every Day Smoker -- 0.50 packs/day  . Smokeless tobacco: Never Used  . Alcohol Use: 0.0 oz/week     Comment: pt states he has been drinking 12 beers and 6 shots daily for several weeks    Review of Systems  Constitutional: Positive for chills, appetite change and fatigue. Negative for fever.  Respiratory: Negative for cough and shortness of breath.   Gastrointestinal: Positive for nausea and vomiting. Negative for abdominal pain, diarrhea and constipation.   Genitourinary: Negative for dysuria, frequency, hematuria and flank pain.  All other systems reviewed and are negative.    Allergies  Review of patient's allergies indicates no known allergies.  Home Medications   Current Outpatient Rx  Name  Route  Sig  Dispense  Refill  . acetaminophen (TYLENOL) 500 MG tablet   Oral   Take 1 tablet (500 mg total) by mouth every 6 (six) hours as needed for pain.   30 tablet   0   . albuterol (PROVENTIL HFA;VENTOLIN HFA) 108 (90 BASE) MCG/ACT inhaler   Inhalation   Inhale 2 puffs into the lungs 2 (two) times daily as needed for wheezing or shortness of breath.          . DULoxetine (CYMBALTA) 60 MG capsule   Oral   Take 1 capsule (60 mg total) by mouth daily.   30 capsule   0   . gabapentin (NEURONTIN) 300 MG capsule   Oral   Take 1 capsule (300 mg total) by mouth 3 (three) times daily.   90 capsule   0   . ibuprofen (ADVIL,MOTRIN) 600 MG tablet   Oral   Take 1 tablet (600 mg total) by mouth every 6 (six) hours as needed for pain.   20 tablet   0   . PARoxetine (PAXIL) 20 MG tablet   Oral   Take 1 tablet (20 mg total) by mouth every morning.   30 tablet   0   .  QUEtiapine (SEROQUEL) 100 MG tablet   Oral   Take 1 tablet (100 mg total) by mouth at bedtime.   30 tablet   0   . traZODone (DESYREL) 100 MG tablet   Oral   Take 1 tablet (100 mg total) by mouth at bedtime.   30 tablet   0    BP 121/84  Pulse 72  Temp(Src) 98.2 F (36.8 C)  Resp 18  SpO2 98% Physical Exam  Nursing note and vitals reviewed. Constitutional: He is oriented to person, place, and time. He appears well-developed and well-nourished. No distress.  HENT:  Head: Normocephalic.  Cardiovascular: Normal rate and regular rhythm.   No murmur heard. Pulmonary/Chest: Effort normal and breath sounds normal. No respiratory distress. He has no wheezes. He has no rales.  Abdominal: Soft. He exhibits no distension. There is no tenderness. There is no  rebound and no guarding.  Musculoskeletal: Normal range of motion.  Neurological: He is alert and oriented to person, place, and time.  Skin: Skin is warm. No rash noted. No erythema.  Psychiatric: He has a normal mood and affect. His behavior is normal.    ED Course  Procedures   Results for orders placed during the hospital encounter of 02/14/13  CBC WITH DIFFERENTIAL      Result Value Range   WBC 7.1  4.0 - 10.5 K/uL   RBC 4.50  4.22 - 5.81 MIL/uL   Hemoglobin 13.6  13.0 - 17.0 g/dL   HCT 21.3  08.6 - 57.8 %   MCV 88.9  78.0 - 100.0 fL   MCH 30.2  26.0 - 34.0 pg   MCHC 34.0  30.0 - 36.0 g/dL   RDW 46.9  62.9 - 52.8 %   Platelets 261  150 - 400 K/uL   Neutrophils Relative % 58  43 - 77 %   Neutro Abs 4.1  1.7 - 7.7 K/uL   Lymphocytes Relative 30  12 - 46 %   Lymphs Abs 2.1  0.7 - 4.0 K/uL   Monocytes Relative 9  3 - 12 %   Monocytes Absolute 0.7  0.1 - 1.0 K/uL   Eosinophils Relative 3  0 - 5 %   Eosinophils Absolute 0.2  0.0 - 0.7 K/uL   Basophils Relative 0  0 - 1 %   Basophils Absolute 0.0  0.0 - 0.1 K/uL  COMPREHENSIVE METABOLIC PANEL      Result Value Range   Sodium 138  135 - 145 mEq/L   Potassium 3.8  3.5 - 5.1 mEq/L   Chloride 103  96 - 112 mEq/L   CO2 26  19 - 32 mEq/L   Glucose, Bld 98  70 - 99 mg/dL   BUN 16  6 - 23 mg/dL   Creatinine, Ser 4.13  0.50 - 1.35 mg/dL   Calcium 9.1  8.4 - 24.4 mg/dL   Total Protein 6.7  6.0 - 8.3 g/dL   Albumin 3.7  3.5 - 5.2 g/dL   AST 16  0 - 37 U/L   ALT 15  0 - 53 U/L   Alkaline Phosphatase 89  39 - 117 U/L   Total Bilirubin 0.8  0.3 - 1.2 mg/dL   GFR calc non Af Amer >90  >90 mL/min   GFR calc Af Amer >90  >90 mL/min  URINALYSIS, ROUTINE W REFLEX MICROSCOPIC      Result Value Range   Color, Urine YELLOW  YELLOW   APPearance CLEAR  CLEAR  Specific Gravity, Urine 1.029  1.005 - 1.030   pH 5.5  5.0 - 8.0   Glucose, UA NEGATIVE  NEGATIVE mg/dL   Hgb urine dipstick NEGATIVE  NEGATIVE   Bilirubin Urine NEGATIVE   NEGATIVE   Ketones, ur NEGATIVE  NEGATIVE mg/dL   Protein, ur NEGATIVE  NEGATIVE mg/dL   Urobilinogen, UA 1.0  0.0 - 1.0 mg/dL   Nitrite NEGATIVE  NEGATIVE   Leukocytes, UA NEGATIVE  NEGATIVE       MDM   1. Nausea & vomiting   2. Viral syndrome      Patient seen and evaluated. Patient appears well in no acute distress. He has soft abdomen without significant tenderness. No peritoneal signs. Will obtain basic lab tests, provide IV fluids and Zofran.  Labs unremarkable. Patient is having improvements after fluids and Zofran. He reports feeling slightly uneasy and stomach still. We'll give additional Zofran and fluids and monitor.  Patient feeling much better after continued IV fluids and medicines. He is stable for discharge at this time.    Angus Seller, PA-C 02/14/13 [phone]

## 2013-02-14 NOTE — ED Notes (Signed)
[facility]  Found [facility] sitting awaiting EMS.   Patient states that he started having nausea and vomiting today. Patient has a history of same issue

## 2013-02-14 NOTE — ED Notes (Signed)
Patient is alert and oriented x3.  He was given DC instructions and follow up visit instructions.  Patient gave verbal understanding.  He was DC ambulatory under his own power to home.  V/S stable.  He was not showing any signs of distress on DC 

## 2013-02-15 ENCOUNTER — Emergency Department (HOSPITAL_COMMUNITY)
Admission: EM | Admit: 2013-02-15 | Discharge: 2013-02-16 | Disposition: A | Discharge status: Other Acute Inpatient Hospital | Payer: Self-pay | Attending: Emergency Medicine | Admitting: Emergency Medicine

## 2013-02-15 ENCOUNTER — Encounter (HOSPITAL_COMMUNITY): Payer: Self-pay | Admitting: Emergency Medicine

## 2013-02-15 DIAGNOSIS — F17200 Nicotine dependence, unspecified, uncomplicated: Secondary | ICD-10-CM | POA: Insufficient documentation

## 2013-02-15 DIAGNOSIS — J45909 Unspecified asthma, uncomplicated: Secondary | ICD-10-CM | POA: Insufficient documentation

## 2013-02-15 DIAGNOSIS — F32A Depression, unspecified: Secondary | ICD-10-CM

## 2013-02-15 DIAGNOSIS — R443 Hallucinations, unspecified: Secondary | ICD-10-CM | POA: Insufficient documentation

## 2013-02-15 DIAGNOSIS — Z8679 Personal history of other diseases of the circulatory system: Secondary | ICD-10-CM | POA: Insufficient documentation

## 2013-02-15 DIAGNOSIS — Z79899 Other long term (current) drug therapy: Secondary | ICD-10-CM | POA: Insufficient documentation

## 2013-02-15 DIAGNOSIS — F329 Major depressive disorder, single episode, unspecified: Secondary | ICD-10-CM | POA: Insufficient documentation

## 2013-02-15 DIAGNOSIS — R45851 Suicidal ideations: Secondary | ICD-10-CM

## 2013-02-15 DIAGNOSIS — R440 Auditory hallucinations: Secondary | ICD-10-CM

## 2013-02-15 DIAGNOSIS — R112 Nausea with vomiting, unspecified: Secondary | ICD-10-CM | POA: Insufficient documentation

## 2013-02-15 DIAGNOSIS — F3289 Other specified depressive episodes: Secondary | ICD-10-CM | POA: Insufficient documentation

## 2013-02-15 LAB — URINALYSIS, ROUTINE W REFLEX MICROSCOPIC
Bilirubin Urine: NEGATIVE
Glucose, UA: NEGATIVE mg/dL
Hgb urine dipstick: NEGATIVE
Ketones, ur: NEGATIVE mg/dL
Leukocytes, UA: NEGATIVE
Nitrite: NEGATIVE
Protein, ur: NEGATIVE mg/dL
Specific Gravity, Urine: 1.019 (ref 1.005–1.030)
Urobilinogen, UA: 0.2 mg/dL (ref 0.0–1.0)
pH: 7 (ref 5.0–8.0)

## 2013-02-15 LAB — CBC WITH DIFFERENTIAL/PLATELET
Basophils Absolute: 0 10*3/uL (ref 0.0–0.1)
Basophils Relative: 1 % (ref 0–1)
Eosinophils Absolute: 0.3 K/uL (ref 0.0–0.7)
Eosinophils Relative: 4 % (ref 0–5)
HCT: 40.5 % (ref 39.0–52.0)
Hemoglobin: 13.5 g/dL (ref 13.0–17.0)
Lymphocytes Relative: 28 % (ref 12–46)
Lymphs Abs: 1.8 K/uL (ref 0.7–4.0)
MCH: 29.9 pg (ref 26.0–34.0)
MCHC: 33.3 g/dL (ref 30.0–36.0)
MCV: 89.6 fL (ref 78.0–100.0)
Monocytes Absolute: 0.6 10*3/uL (ref 0.1–1.0)
Monocytes Relative: 10 % (ref 3–12)
Neutro Abs: 3.6 K/uL (ref 1.7–7.7)
Neutrophils Relative %: 57 % (ref 43–77)
Platelets: 239 K/uL (ref 150–400)
RBC: 4.52 MIL/uL (ref 4.22–5.81)
RDW: 13.1 % (ref 11.5–15.5)
WBC: 6.2 K/uL (ref 4.0–10.5)

## 2013-02-15 LAB — COMPREHENSIVE METABOLIC PANEL
AST: 17 U/L (ref 0–37)
Albumin: 3.8 g/dL (ref 3.5–5.2)
CO2: 25 mEq/L (ref 19–32)
Calcium: 9.3 mg/dL (ref 8.4–10.5)
Creatinine, Ser: 0.65 mg/dL (ref 0.50–1.35)
GFR calc non Af Amer: >90 mL/min (ref >90)

## 2013-02-15 LAB — ETHANOL: Alcohol, Ethyl (B): <11 mg/dL (ref 0–11)

## 2013-02-15 LAB — LIPASE, BLOOD: Lipase: 41 U/L (ref 11–59)

## 2013-02-15 LAB — COMPREHENSIVE METABOLIC PANEL WITH GFR
ALT: 18 U/L (ref 0–53)
Alkaline Phosphatase: 99 U/L (ref 39–117)
BUN: 13 mg/dL (ref 6–23)
Chloride: 103 meq/L (ref 96–112)
GFR calc Af Amer: >90 mL/min (ref >90)
Glucose, Bld: 94 mg/dL (ref 70–99)
Potassium: 3.8 meq/L (ref 3.5–5.1)
Sodium: 138 meq/L (ref 135–145)
Total Bilirubin: 0.8 mg/dL (ref 0.3–1.2)
Total Protein: 6.9 g/dL (ref 6.0–8.3)

## 2013-02-15 LAB — RAPID URINE DRUG SCREEN, HOSP PERFORMED
Amphetamines: NOT DETECTED
Barbiturates: NOT DETECTED
Benzodiazepines: NOT DETECTED
Cocaine: NOT DETECTED
Opiates: NOT DETECTED
Tetrahydrocannabinol: NOT DETECTED

## 2013-02-15 MED ORDER — DULOXETINE HCL 60 MG PO CPEP
60.0000 mg | ORAL_CAPSULE | Freq: Every day | ORAL | Status: DC
  Administered 2013-02-15 – 2013-02-16 (×2): 60 mg via ORAL
  Filled 2013-02-15 (×2): qty 1

## 2013-02-15 MED ORDER — ALUM & MAG HYDROXIDE-SIMETH 200-200-20 MG/5ML PO SUSP: 30.0000 mL | ORAL | Status: DC | PRN

## 2013-02-15 MED ORDER — QUETIAPINE FUMARATE 25 MG PO TABS
100.0000 mg | ORAL_TABLET | Freq: Every day | ORAL | Status: DC
  Administered 2013-02-15: 100 mg via ORAL
  Filled 2013-02-15: qty 4

## 2013-02-15 MED ORDER — PAROXETINE HCL 20 MG PO TABS
20.0000 mg | ORAL_TABLET | ORAL | Status: DC
  Administered 2013-02-15 – 2013-02-16 (×2): 20 mg via ORAL
  Filled 2013-02-15 (×3): qty 1

## 2013-02-15 MED ORDER — ALBUTEROL SULFATE HFA 108 (90 BASE) MCG/ACT IN AERS: 2.0000 | INHALATION_SPRAY | Freq: Two times a day (BID) | RESPIRATORY_TRACT | Status: DC | PRN

## 2013-02-15 MED ORDER — GABAPENTIN 300 MG PO CAPS
300.0000 mg | ORAL_CAPSULE | Freq: Three times a day (TID) | ORAL | Status: DC
  Administered 2013-02-15 – 2013-02-16 (×3): 300 mg via ORAL
  Filled 2013-02-15 (×3): qty 1

## 2013-02-15 MED ORDER — ONDANSETRON 4 MG PO TBDP
8.0000 mg | ORAL_TABLET | Freq: Once | ORAL | Status: AC
  Administered 2013-02-15: 8 mg via ORAL
  Filled 2013-02-15: qty 2

## 2013-02-15 MED ORDER — ONDANSETRON HCL 4 MG PO TABS: 4.0000 mg | ORAL_TABLET | Freq: Three times a day (TID) | ORAL | Status: DC | PRN

## 2013-02-15 MED ORDER — TRAZODONE HCL 50 MG PO TABS
100.0000 mg | ORAL_TABLET | Freq: Every day | ORAL | Status: DC
  Administered 2013-02-15: 100 mg via ORAL
  Filled 2013-02-15: qty 2

## 2013-02-15 MED ORDER — ACETAMINOPHEN 325 MG PO TABS: 650.0000 mg | ORAL_TABLET | ORAL | Status: DC | PRN

## 2013-02-15 MED ORDER — LORAZEPAM 1 MG PO TABS: 1.0000 mg | ORAL_TABLET | Freq: Three times a day (TID) | ORAL | Status: DC | PRN
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
## 2013-02-15 NOTE — ED Notes (Signed)
Pt sleeping. RN report received. Sitter moved to other room based on acuity
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
## 2013-02-15 NOTE — BH Assessment (Signed)
[facility] Assessment Progress Note Update:  Pt accepted to [facility] by Fransisca Kaufmann, NP @ 1430 pending a 400 Hall bed.

## 2013-02-15 NOTE — ED Notes (Signed)
Assessment to be done @ 13:15

## 2013-02-15 NOTE — ED Notes (Signed)
MD at bedside. 

## 2013-02-15 NOTE — ED Notes (Signed)
Pt states he has been having suicidal thoughts. Pt states he was by a bridge and thought about jumping off but denies suicide attempt.

## 2013-02-16 NOTE — BH Assessment (Signed)
[facility] Assessment Progress Note   Patient reviewed at shift change; needs placement. Patient was referred to the following hospitals:  [facility]: Spoke with Sudie Bailey; gave clinical information and she requested we fax the information to them at [phone]. Information faxed and awaiting response.  [facility]: Spoke with Elita Quick; she requested we fax the clinical information to [phone]. Information faxed and awaiting response.   [facility]: Spoke with Larita Fife; she is not sure if they will have any beds available, but to fax the referral and someone would look at it late in the afternoon. Information was faxed to [phone].  [facility]: Spoke with [place]; he doesn't feel they will likely have beds to outside facilities today, due to the number of individuals in the Emergency Department there, however he requested we fax the information to admissions for possible consideration for Monday. Information faxed to [phone].  [facility]: Spoke with Shawna Orleans: They do not have any beds today, but are expecting discharges tomorrow. She requested the information be faxed and they will review to see if he would be appropriate for admission on Monday. Information faxed to [phone].  [facility]: Spoke with [place]; they do not have any adult beds at this time. She requested that we fax the referral today as they are going to have discharges tomorrow and they may consider him for a possible Monday admission. Referral faxed to [phone].  Also spoke to the following hospitals:  [facility]: [facility]-no beds at this time. [facility]-no beds at this time. [facility]-no beds today-[facility] beds-per [facility] beds. [facility]-no regular adult psychiatric beds, only one gero bed. [facility]-No beds per Darl Pikes.  [facility]-no answer after repeated calls for one hour, information faxed.   Shon Baton, LCSW, LCASA, CSW-G

## 2013-02-16 NOTE — BH Assessment (Signed)
[facility] Assessment Progress Note    Pam from [facility] called back; needed information concerning the patient's IVC status and whether he can return to the shelter. Spoke with Kriste Basque, Charity fundraiser. Patient not currently IVC'd but can be and he is at [facility] and can return back there for one more month. Contacted Pam; told her the information. She called back, stating patient was accepted pending the IVC papers being faxed to her. Contacted Maria, the tech, who agreed to complete the IVC papers with ED physician and then fax to [facility].   Shon Baton, LCSW, [place], CSW-G

## 2013-02-16 NOTE — Progress Notes (Signed)
Findings and custody order has been brought to [facility] ED by [employer].  It has been faxed to [facility] along with patient's face sheet as requested by Pam at intake at [facility].  Tomi Bamberger, MHT

## 2013-02-16 NOTE — Progress Notes (Signed)
IVC paper work filled out by [employer] and notarized, has been faxed to [facility] and also to [facility].  Will call both places to confirm that they have been received.  Tomi Bamberger, MHT

## 2013-02-16 NOTE — ED Provider Notes (Signed)
Patient is awaiting psychiatric disposition. He appears to be having active hallucinations per the notes that earlier. The patient is awaiting psychiatric disposition. Vital signs appear stable this morning.  Celene Kras, MD 02/16/13 [phone]

## 2013-02-20 NOTE — ED Provider Notes (Signed)
Medical screening examination/treatment/procedure(s) were performed by non-physician practitioner and as supervising physician I was immediately available for consultation/collaboration.   Toy Baker, MD 02/20/13 0900

## 2013-02-24 ENCOUNTER — Emergency Department (HOSPITAL_COMMUNITY): Payer: Self-pay

## 2013-02-24 ENCOUNTER — Emergency Department (HOSPITAL_COMMUNITY)
Admission: EM | Admit: 2013-02-24 | Discharge: 2013-02-24 | Disposition: A | Discharge status: Home / Self Care | Payer: Self-pay | Attending: Emergency Medicine | Admitting: Emergency Medicine

## 2013-02-24 ENCOUNTER — Encounter (HOSPITAL_COMMUNITY): Payer: Self-pay | Admitting: Emergency Medicine

## 2013-02-24 DIAGNOSIS — F17200 Nicotine dependence, unspecified, uncomplicated: Secondary | ICD-10-CM | POA: Insufficient documentation

## 2013-02-24 DIAGNOSIS — J45909 Unspecified asthma, uncomplicated: Secondary | ICD-10-CM | POA: Insufficient documentation

## 2013-02-24 DIAGNOSIS — Z79899 Other long term (current) drug therapy: Secondary | ICD-10-CM | POA: Insufficient documentation

## 2013-02-24 DIAGNOSIS — S20219A Contusion of unspecified front wall of thorax, initial encounter: Secondary | ICD-10-CM | POA: Insufficient documentation

## 2013-02-24 DIAGNOSIS — I10 Essential (primary) hypertension: Secondary | ICD-10-CM | POA: Insufficient documentation

## 2013-02-24 DIAGNOSIS — S20212A Contusion of left front wall of thorax, initial encounter: Secondary | ICD-10-CM

## 2013-02-24 DIAGNOSIS — F319 Bipolar disorder, unspecified: Secondary | ICD-10-CM | POA: Insufficient documentation

## 2013-02-24 DIAGNOSIS — S63509A Unspecified sprain of unspecified wrist, initial encounter: Secondary | ICD-10-CM | POA: Insufficient documentation

## 2013-02-24 NOTE — ED Notes (Signed)
Bus pass given to pt

## 2013-02-24 NOTE — Progress Notes (Signed)
Orthopedic Tech Progress Note Patient Details:  [name] [name] [birth year] [id number]  Ortho Devices Type of Ortho Device: Thumb velcro splint Ortho Device/Splint Location: lue Ortho Device/Splint Interventions: Application   [name] 02/24/2013, 3:15 PM
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
## 2013-02-28 NOTE — ED Provider Notes (Signed)
Medical screening examination/treatment/procedure(s) were performed by non-physician practitioner and as supervising physician I was immediately available for consultation/collaboration.   Candyce Churn, MD 02/28/13 2010

## 2013-07-24 IMAGING — CR DG CHEST 2V
2 series · 2 of 2 positions shown · non-contrast
Comparison: None.

CLINICAL DATA: Cough and mid chest pain.  Previous smoker.

CHEST - 2 VIEW

[w chest pa]
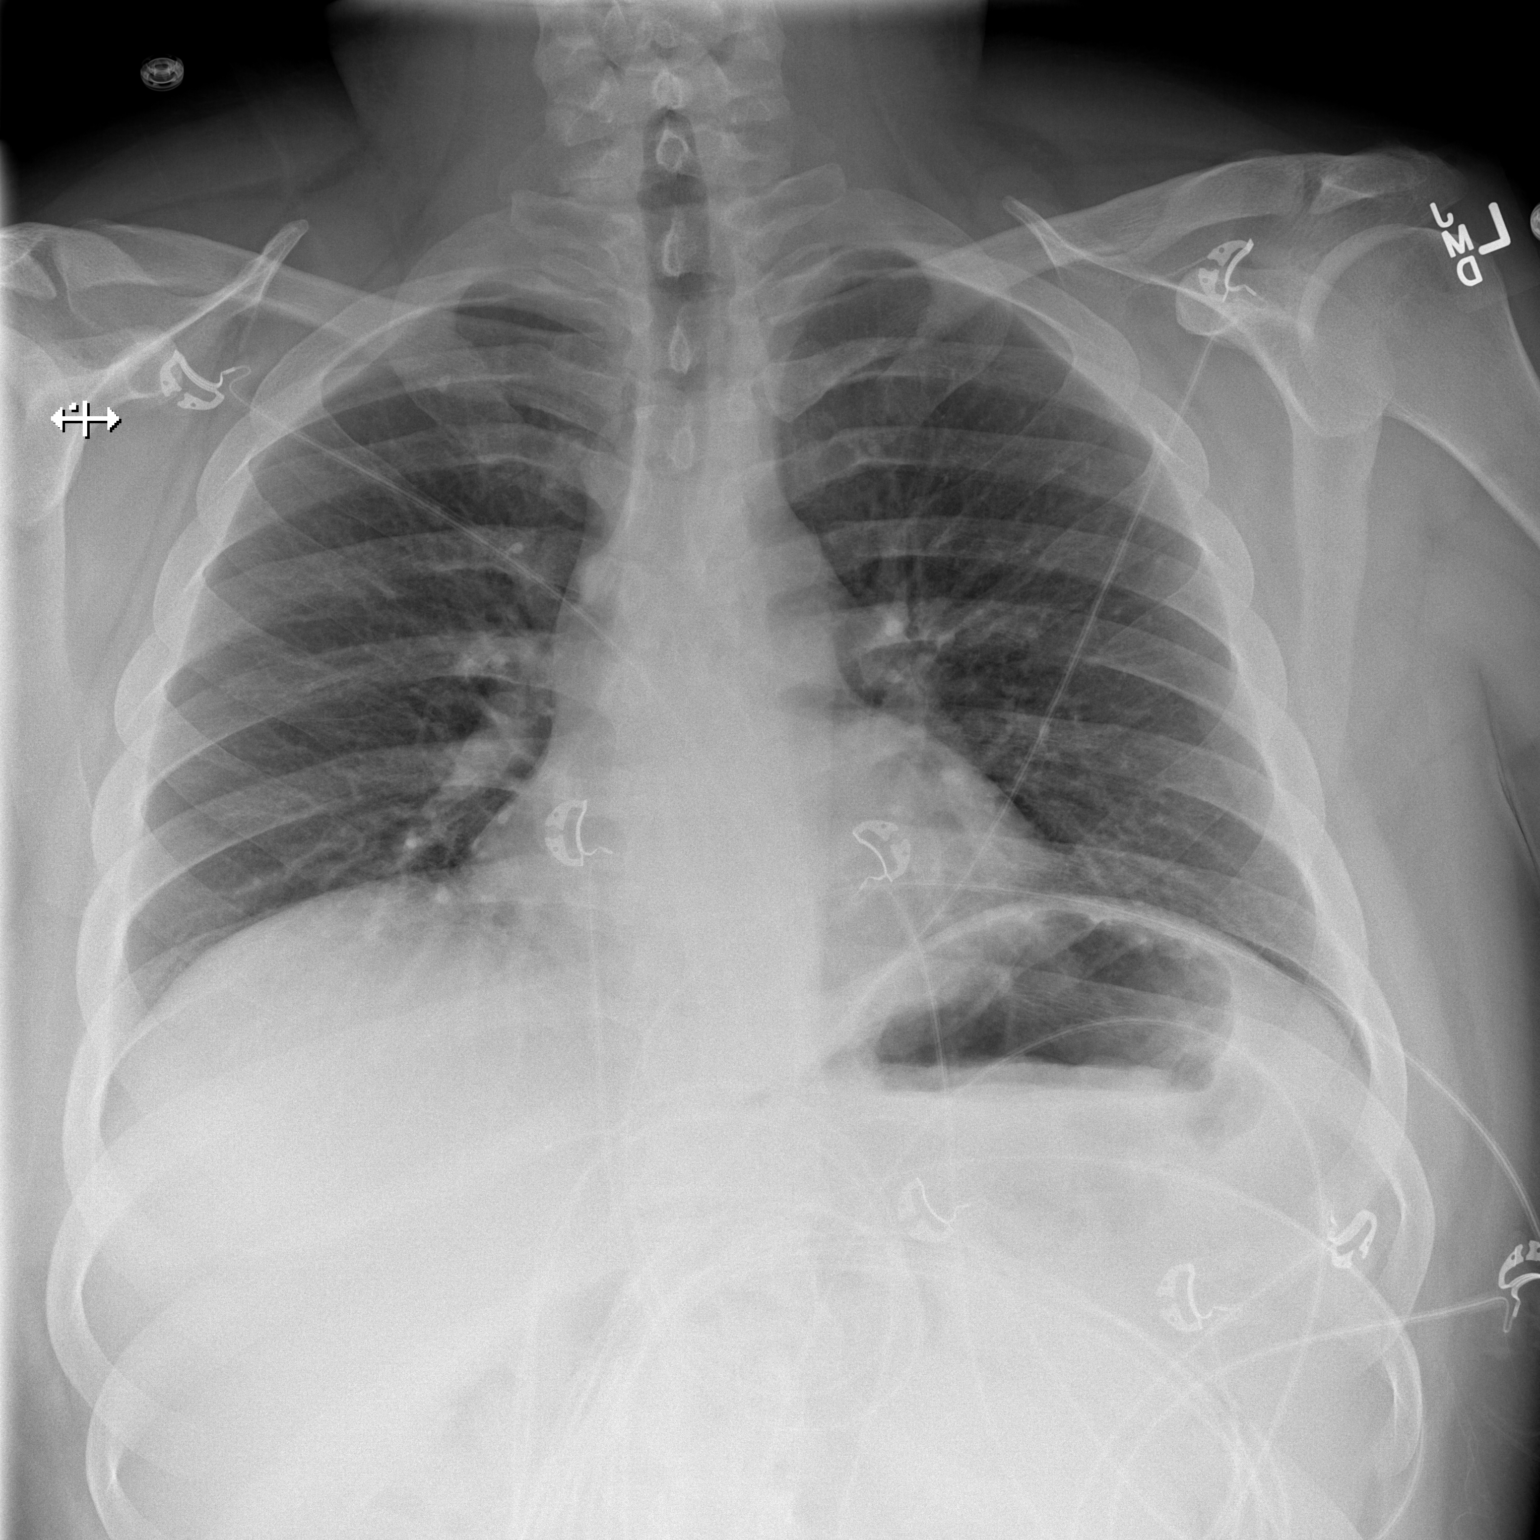

[w chest lat]
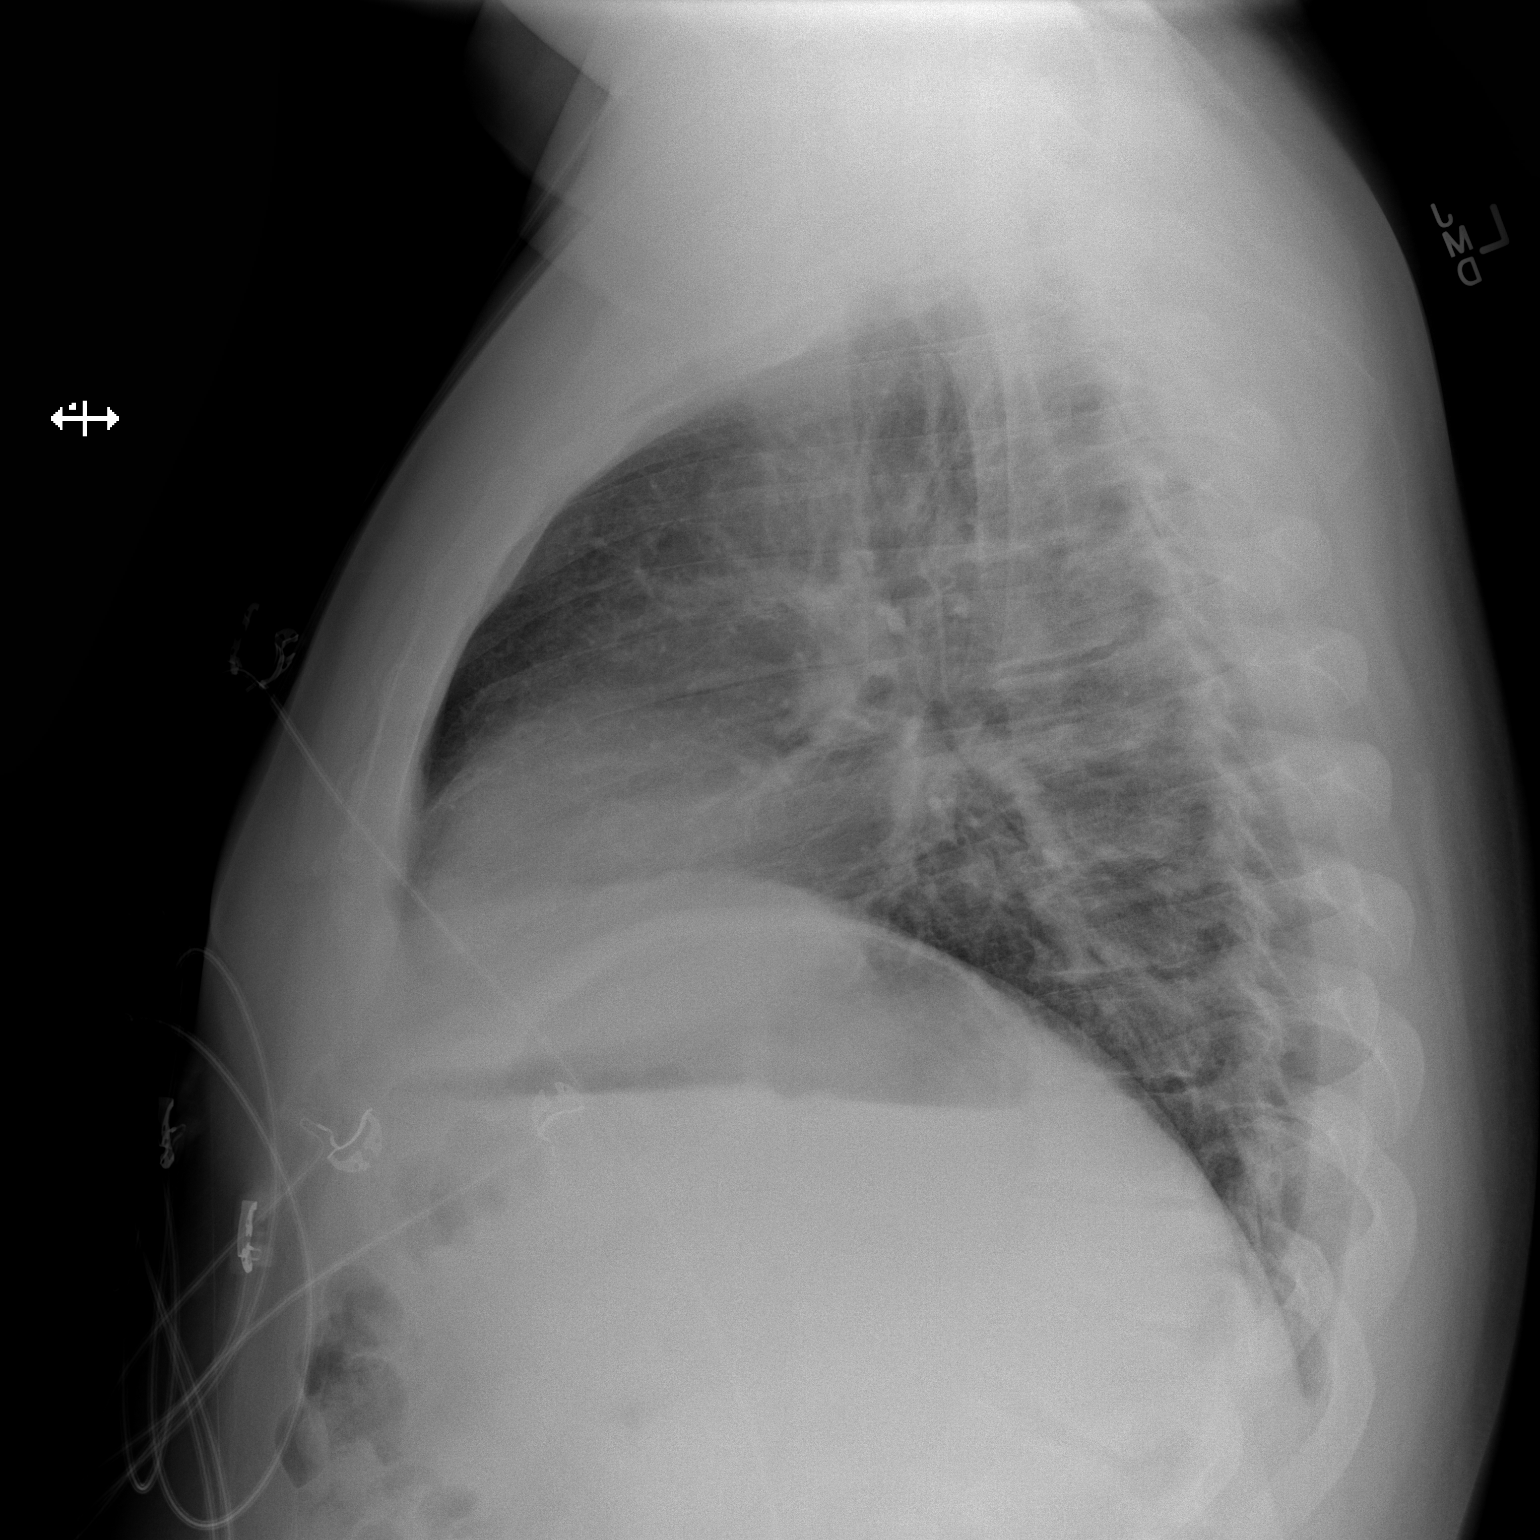

[2 of 2 positions shown; findings below may reference images not displayed]

FINDINGS: Shallow inspiration. The heart size and pulmonary
vascularity are normal. The lungs appear clear and expanded without
focal air space disease or consolidation. No blunting of the
costophrenic angles.  Anterior compression of a lower thoracic and
upper lumbar vertebrae of nonspecific age but probably present on a
previous abdominal film from 02/11/2011.
IMPRESSION: No evidence of active pulmonary disease.

## 2014-02-11 ENCOUNTER — Emergency Department (HOSPITAL_COMMUNITY)
Admission: EM | Admit: 2014-02-11 | Discharge: 2014-02-12 | Disposition: A | Discharge status: Home / Self Care | Payer: Self-pay | Attending: Emergency Medicine | Admitting: Emergency Medicine

## 2014-02-11 ENCOUNTER — Emergency Department (HOSPITAL_COMMUNITY): Payer: Self-pay

## 2014-02-11 ENCOUNTER — Encounter (HOSPITAL_COMMUNITY): Payer: Self-pay | Admitting: Emergency Medicine

## 2014-02-11 DIAGNOSIS — Y9301 Activity, walking, marching and hiking: Secondary | ICD-10-CM | POA: Insufficient documentation

## 2014-02-11 DIAGNOSIS — J45909 Unspecified asthma, uncomplicated: Secondary | ICD-10-CM | POA: Insufficient documentation

## 2014-02-11 DIAGNOSIS — K047 Periapical abscess without sinus: Secondary | ICD-10-CM | POA: Insufficient documentation

## 2014-02-11 DIAGNOSIS — R296 Repeated falls: Secondary | ICD-10-CM | POA: Insufficient documentation

## 2014-02-11 DIAGNOSIS — Z79899 Other long term (current) drug therapy: Secondary | ICD-10-CM | POA: Insufficient documentation

## 2014-02-11 DIAGNOSIS — F4323 Adjustment disorder with mixed anxiety and depressed mood: Secondary | ICD-10-CM

## 2014-02-11 DIAGNOSIS — R440 Auditory hallucinations: Secondary | ICD-10-CM

## 2014-02-11 DIAGNOSIS — IMO0002 Reserved for concepts with insufficient information to code with codable children: Secondary | ICD-10-CM | POA: Insufficient documentation

## 2014-02-11 DIAGNOSIS — F17200 Nicotine dependence, unspecified, uncomplicated: Secondary | ICD-10-CM | POA: Insufficient documentation

## 2014-02-11 DIAGNOSIS — S0083XA Contusion of other part of head, initial encounter: Secondary | ICD-10-CM | POA: Insufficient documentation

## 2014-02-11 DIAGNOSIS — I10 Essential (primary) hypertension: Secondary | ICD-10-CM | POA: Insufficient documentation

## 2014-02-11 DIAGNOSIS — F4329 Adjustment disorder with other symptoms: Secondary | ICD-10-CM

## 2014-02-11 DIAGNOSIS — F319 Bipolar disorder, unspecified: Secondary | ICD-10-CM | POA: Insufficient documentation

## 2014-02-11 DIAGNOSIS — R443 Hallucinations, unspecified: Secondary | ICD-10-CM | POA: Insufficient documentation

## 2014-02-11 DIAGNOSIS — S0081XA Abrasion of other part of head, initial encounter: Secondary | ICD-10-CM

## 2014-02-11 DIAGNOSIS — Y929 Unspecified place or not applicable: Secondary | ICD-10-CM | POA: Insufficient documentation

## 2014-02-11 DIAGNOSIS — S1093XA Contusion of unspecified part of neck, initial encounter: Principal | ICD-10-CM

## 2014-02-11 DIAGNOSIS — S0003XA Contusion of scalp, initial encounter: Secondary | ICD-10-CM | POA: Insufficient documentation

## 2014-02-11 LAB — URINALYSIS, ROUTINE W REFLEX MICROSCOPIC
Bilirubin Urine: NEGATIVE
Glucose, UA: NEGATIVE mg/dL
Hgb urine dipstick: NEGATIVE
Ketones, ur: NEGATIVE mg/dL
LEUKOCYTES UA: NEGATIVE
NITRITE: NEGATIVE
Protein, ur: NEGATIVE mg/dL
SPECIFIC GRAVITY, URINE: 1.028 (ref 1.005–1.030)
UROBILINOGEN UA: 1 mg/dL (ref 0.0–1.0)
pH: 5.5 (ref 5.0–8.0)

## 2014-02-11 LAB — CBC WITH DIFFERENTIAL/PLATELET
Basophils Absolute: 0 10*3/uL (ref 0.0–0.1)
Basophils Relative: 0 % (ref 0–1)
EOS ABS: 0.1 10*3/uL (ref 0.0–0.7)
EOS PCT: 2 % (ref 0–5)
HCT: 42.5 % (ref 39.0–52.0)
Hemoglobin: 14.3 g/dL (ref 13.0–17.0)
LYMPHS ABS: 1.6 10*3/uL (ref 0.7–4.0)
Lymphocytes Relative: 22 % (ref 12–46)
MCH: 30.2 pg (ref 26.0–34.0)
MCHC: 33.6 g/dL (ref 30.0–36.0)
MCV: 89.7 fL (ref 78.0–100.0)
Monocytes Absolute: 0.8 10*3/uL (ref 0.1–1.0)
Monocytes Relative: 11 % (ref 3–12)
Neutro Abs: 4.8 10*3/uL (ref 1.7–7.7)
Neutrophils Relative %: 65 % (ref 43–77)
PLATELETS: 268 10*3/uL (ref 150–400)
RBC: 4.74 MIL/uL (ref 4.22–5.81)
RDW: 12.8 % (ref 11.5–15.5)
WBC: 7.4 10*3/uL (ref 4.0–10.5)

## 2014-02-11 LAB — RAPID URINE DRUG SCREEN, HOSP PERFORMED
Amphetamines: NOT DETECTED
Barbiturates: NOT DETECTED
Benzodiazepines: NOT DETECTED
COCAINE: NOT DETECTED
OPIATES: NOT DETECTED
Tetrahydrocannabinol: NOT DETECTED

## 2014-02-11 LAB — COMPREHENSIVE METABOLIC PANEL
ALT: 23 U/L (ref 0–53)
ANION GAP: 13 (ref 5–15)
AST: 25 U/L (ref 0–37)
Albumin: 4 g/dL (ref 3.5–5.2)
Alkaline Phosphatase: 120 U/L — ABNORMAL HIGH (ref 39–117)
BUN: 13 mg/dL (ref 6–23)
CALCIUM: 9.3 mg/dL (ref 8.4–10.5)
CO2: 22 mEq/L (ref 19–32)
Chloride: 101 mEq/L (ref 96–112)
Creatinine, Ser: 0.8 mg/dL (ref 0.50–1.35)
GFR calc non Af Amer: >90 mL/min (ref >90)
GLUCOSE: 99 mg/dL (ref 70–99)
Potassium: 3.5 mEq/L — ABNORMAL LOW (ref 3.7–5.3)
SODIUM: 136 meq/L — AB (ref 137–147)
TOTAL PROTEIN: 7.3 g/dL (ref 6.0–8.3)
Total Bilirubin: 1.7 mg/dL — ABNORMAL HIGH (ref 0.3–1.2)

## 2014-02-11 LAB — SALICYLATE LEVEL: Salicylate Lvl: <2 mg/dL — ABNORMAL LOW (ref 2.8–20.0)

## 2014-02-11 LAB — ACETAMINOPHEN LEVEL: Acetaminophen (Tylenol), Serum: <15 ug/mL (ref 10–30)

## 2014-02-11 LAB — ETHANOL

## 2014-02-11 MED ORDER — IBUPROFEN 200 MG PO TABS: 600.0000 mg | ORAL_TABLET | Freq: Three times a day (TID) | ORAL | Status: DC | PRN

## 2014-02-11 MED ORDER — DULOXETINE HCL 60 MG PO CPEP
60.0000 mg | ORAL_CAPSULE | Freq: Every day | ORAL | Status: DC
  Administered 2014-02-11 – 2014-02-12 (×2): 60 mg via ORAL
  Filled 2014-02-11 (×2): qty 1

## 2014-02-11 MED ORDER — TRAZODONE HCL 100 MG PO TABS
100.0000 mg | ORAL_TABLET | Freq: Every day | ORAL | Status: DC
  Administered 2014-02-11: 100 mg via ORAL
  Filled 2014-02-11: qty 1

## 2014-02-11 MED ORDER — ONDANSETRON HCL 4 MG PO TABS: 4.0000 mg | ORAL_TABLET | Freq: Three times a day (TID) | ORAL | Status: DC | PRN

## 2014-02-11 MED ORDER — ACETAMINOPHEN 325 MG PO TABS: 650.0000 mg | ORAL_TABLET | ORAL | Status: DC | PRN

## 2014-02-11 MED ORDER — ALBUTEROL SULFATE HFA 108 (90 BASE) MCG/ACT IN AERS: 2.0000 | INHALATION_SPRAY | Freq: Two times a day (BID) | RESPIRATORY_TRACT | Status: DC | PRN

## 2014-02-11 MED ORDER — GABAPENTIN 300 MG PO CAPS
300.0000 mg | ORAL_CAPSULE | Freq: Three times a day (TID) | ORAL | Status: DC
Start: 2014-02-11 — End: 2014-02-12
  Administered 2014-02-11 – 2014-02-12 (×3): 300 mg via ORAL
  Filled 2014-02-11 (×3): qty 1
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
## 2014-02-11 NOTE — Progress Notes (Signed)
[facility] Community Liaison Stacy, ° °Provided pt with a [facility] Orange Card, highlighting [facility], to help patient establish primary care.  °

## 2014-02-11 NOTE — ED Notes (Signed)
Bed: WTR5 Expected date:  Expected time:  Means of arrival:  Comments: EMS 

## 2014-02-11 NOTE — ED Notes (Signed)
Patient trip and [place] Seat last night and has a laceration under  the R eye. Denies any LOC. Patient states just dizziness and nausea. No complaints of N/V or dizziness today.  EMS states vital signs BP  118/72 Pulse 90 RR19.

## 2014-02-11 NOTE — ED Provider Notes (Signed)
CSN: [id number]     Arrival date & time 02/11/14  1343 History  This chart was scribed for non-physician practitioner, Terri Piedra, PA-C,working with Audree Camel, MD, by Karle Plumber, ED Scribe. This patient was seen in room WTR5/WTR5 and the patient's care was started at 3:06 PM.  Chief Complaint  Patient presents with  . Fall  . Paranoid   Patient is a 33 y.o. male presenting with fall. The history is provided by the patient. No language interpreter was used.  Fall Pertinent negatives include no chest pain, no headaches and no shortness of breath.   HPI Comments:  [name] [name] is a 33 y.o. male with PMH of mental disorder, HTN, depression and Bipolar disorder, brought in by EMS, who presents to the Emergency Department complaining of a fall that occurred last night. He reports he got dizzy and was not able to catch himself and fell, hitting his right cheek below his eye. He reports a HA immediately after that has since resolved.  Pt has taken Tylenol for the pain with significant relief with the last dose last night. He was experiencing some associated blurred vision of the right eye but states it was secondary to the swelling of his cheek and it has now resolved. He states he has been off of his medications for the past few days and reports hearing voices the past few days. He states he has had these same symptoms before when he was off his medications. He denies LOC, fever, chills, balance issues, CP, SOB, neck pain, back pain, HI or SI. He states he does want to be evaluated by a psychiatrist here.   Past Medical History  Diagnosis Date  . Mental disorder   . Hypertension   . Depression   . Bipolar 1 disorder   . Irregular heart rate   . Asthma    History reviewed. No pertinent past surgical history. Family History  Problem Relation Age of Onset  . Hypertension Mother   . Hypertension Other    History  Substance Use Topics  . Smoking status: Current Every Day  Smoker -- 0.50 packs/day  . Smokeless tobacco: Never Used  . Alcohol Use: 0.0 oz/week     Comment: pt states he has been drinking 12 beers and 6 shots daily for several weeks    Review of Systems  Constitutional: Negative for fever and chills.  Eyes: Negative for visual disturbance.  Respiratory: Negative for shortness of breath.   Cardiovascular: Negative for chest pain.  Gastrointestinal: Negative for nausea and vomiting.  Skin: Positive for wound.  Neurological: Negative for syncope and headaches.  Psychiatric/Behavioral: Positive for hallucinations. Negative for suicidal ideas and self-injury.  All other systems reviewed and are negative.   Allergies  Review of patient's allergies indicates no known allergies.  Home Medications   Prior to Admission medications   Medication Sig Start Date End Date Taking? Authorizing Provider  acetaminophen (TYLENOL) 500 MG tablet Take 1,500 mg by mouth every 6 (six) hours as needed for mild pain.   Yes Historical Provider, MD  albuterol (PROVENTIL HFA;VENTOLIN HFA) 108 (90 BASE) MCG/ACT inhaler Inhale 2 puffs into the lungs 2 (two) times daily as needed for wheezing or shortness of breath.    Yes Historical Provider, MD  DULoxetine (CYMBALTA) 60 MG capsule Take 1 capsule (60 mg total) by mouth daily. 01/16/13  Yes Verne Spurr, PA-C  gabapentin (NEURONTIN) 300 MG capsule Take 1 capsule (300 mg total) by mouth 3 (three) times  daily. 01/16/13  Yes Verne Spurr, PA-C  traZODone (DESYREL) 100 MG tablet Take 1 tablet (100 mg total) by mouth at bedtime. 01/16/13  Yes Verne Spurr, PA-C   Triage Vitals: BP 127/80  Pulse 80  Temp(Src) 98.2 F (36.8 C) (Oral)  Resp 16  Ht 6' (1.829 m)  Wt 300 lb (136.079 kg)  BMI 40.68 kg/m2  SpO2 99% Physical Exam  Nursing note and vitals reviewed. Constitutional: He is oriented to person, place, and time. He appears well-developed and well-nourished.  HENT:  Head: Normocephalic and atraumatic.  Mouth/Throat:  Oropharynx is clear and moist and mucous membranes are normal.  Right sided zygoma tenderness to palpation. 1 cm abrasion to right cheek.  Eyes: Conjunctivae and EOM are normal. Pupils are equal, round, and reactive to light. Right eye exhibits no discharge. Left eye exhibits no discharge. Right conjunctiva is not injected. Left conjunctiva is not injected.  Neck: Normal range of motion.  Cardiovascular: Normal rate and normal heart sounds.  Exam reveals no gallop and no friction rub.   No murmur heard. Pulmonary/Chest: Effort normal and breath sounds normal. No respiratory distress. He has no decreased breath sounds. He has no wheezes. He has no rhonchi. He has no rales.  Musculoskeletal: Normal range of motion.  Neurological: He is alert and oriented to person, place, and time. No cranial nerve deficit.  No sensory deficits.  Skin: Skin is warm and dry.  Psychiatric: He has a normal mood and affect. His behavior is normal.    ED Course  Procedures (including critical care time) DIAGNOSTIC STUDIES: Oxygen Saturation is 99% on RA, normal by my interpretation.   COORDINATION OF CARE: 3:12 PM- Will order CT maxio facial and standard medical clearance labs. Pt verbalizes understanding and agrees to plan.  Medications  acetaminophen (TYLENOL) tablet 650 mg (not administered)  ibuprofen (ADVIL,MOTRIN) tablet 600 mg (not administered)  ondansetron (ZOFRAN) tablet 4 mg (not administered)  albuterol (PROVENTIL HFA;VENTOLIN HFA) 108 (90 BASE) MCG/ACT inhaler 2 puff (not administered)  DULoxetine (CYMBALTA) DR capsule 60 mg (60 mg Oral Given 02/11/14 1703)  gabapentin (NEURONTIN) capsule 300 mg (300 mg Oral Given 02/11/14 1703)  traZODone (DESYREL) tablet 100 mg (not administered)    Labs Review Labs Reviewed  COMPREHENSIVE METABOLIC PANEL - Abnormal; Notable for the following:    Sodium 136 (*)    Potassium 3.5 (*)    Alkaline Phosphatase 120 (*)    Total Bilirubin 1.7 (*)    All other  components within normal limits  URINALYSIS, ROUTINE W REFLEX MICROSCOPIC - Abnormal; Notable for the following:    Color, Urine AMBER (*)    APPearance CLOUDY (*)    All other components within normal limits  SALICYLATE LEVEL - Abnormal; Notable for the following:    Salicylate Lvl <2.0 (*)    All other components within normal limits  CBC WITH DIFFERENTIAL  URINE RAPID DRUG SCREEN (HOSP PERFORMED)  ACETAMINOPHEN LEVEL  ETHANOL    Imaging Review Ct Maxillofacial Wo Cm  02/11/2014   CLINICAL DATA:  Larey Seat last night, dizziness, struck RIGHT cheek, headache, blurred vision RIGHT eye, facial swelling  EXAM: CT MAXILLOFACIAL WITHOUT CONTRAST  TECHNIQUE: Multidetector CT imaging of the maxillofacial structures was performed. Multiplanar CT image reconstructions were also generated. A small metallic BB was placed on the right temple in order to reliably differentiate right from left.  COMPARISON:  11/27/2007  FINDINGS: Visualized intracranial structures unremarkable.  RIGHT inferior orbital and facial soft tissue swelling.  Intraorbital soft  tissue planes clear.  Large mucosal retention cyst LEFT maxillary sinus.  Remaining paranasal sinuses, mastoid air cells, and middle ear cavities clear.  Minimal nasal septal deviation to the RIGHT.  Aerated RIGHT middle terminate.  Periodontal disease at base of RIGHT maxillary tooth #1 with associated dental caries.  No definite facial bone fracture identified.  IMPRESSION: No acute facial bone abnormalities.  Mucosal retention cyst LEFT maxillary sinus.  Periodontal disease and dental caries at tooth #1, cannot exclude periapical abscess   Electronically Signed   By: [name] Southward M.D.   On: 02/11/2014 15:48     EKG Interpretation None      MDM   Final diagnoses:  Facial abrasion, initial encounter  Facial contusion, initial encounter  Auditory hallucinations  Periapical abscess    Patient is a 33 y.o. Male who presents to the ED with multiple  complaints.  Physical examination is without focal neuro deficits.  CT maxillofacial ordered to due to bony tenderness to palpation of the nose and zygoma.  CT maxillofacial was negative for acute fractures but likely has periapical abscesses.  Will need penicillin/amoxicillin prescription prior to discharge.  Patient also complains of auditory hallucinations.  Patient requesting to be seen by psychiatry.  Patient had basic labs drawn here which show mild hypokalemia.  Patient is otherwise medically cleared.  Patient placed in psych hold at this time and TTS consulted.  Patient awaiting TTS at this time.     I personally performed the services described in this documentation, which was scribed in my presence. The recorded information has been reviewed and is accurate.    Eben Burow, PA-C 02/11/14 2033

## 2014-02-11 NOTE — ED Notes (Signed)
Pt and belongings have been wanded, pt's belongings will be given to secure hold staff pt going to room 40.

## 2014-02-11 NOTE — ED Notes (Signed)
Bed: WBH40 Expected date:  Expected time:  Means of arrival:  Comments: Triage 5 

## 2014-02-11 NOTE — ED Notes (Addendum)
Patient states he was walking and  got dizzy patient stated tried to break the fall and hit his head on the conner of  Steps. Patient denies any LOC and headache. Patient states 7/10 pain on R side of the face. Patient has a laceration under the R eye. Patient states he has taken his medication in 8 days. Patient states he is hearing voices calling his name  For the past five days.

## 2014-02-12 DIAGNOSIS — F4325 Adjustment disorder with mixed disturbance of emotions and conduct: Secondary | ICD-10-CM

## 2014-02-12 MED ORDER — TRAZODONE HCL 100 MG PO TABS: 100.0000 mg | ORAL_TABLET | Freq: Every day | ORAL | Status: DC

## 2014-02-12 MED ORDER — GABAPENTIN 300 MG PO CAPS: 300.0000 mg | ORAL_CAPSULE | Freq: Three times a day (TID) | ORAL | Status: DC

## 2014-02-12 MED ORDER — DULOXETINE HCL 60 MG PO CPEP: 60.0000 mg | ORAL_CAPSULE | Freq: Every day | ORAL | Status: DC
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
## 2014-02-12 NOTE — ED Notes (Signed)
Written dc instructions and rx x3 reviewed with pt.  Pt encouraged to take medications as directed, follow up with his MD, and return for suicidal/homicidal thoughts or urges, or hallucinations.  Pt verbalized understanding and denies si/hi/avh at this time.  Pt ambulatory w/o difficulty to dc window w/ mHt, belongings returned after leaving the area.  Bus pass given.
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
## 2014-02-12 NOTE — ED Notes (Signed)
Dr Taylor and Shuvon NP into see 

## 2014-02-12 NOTE — [facility] Suicide Risk Assessment (Signed)
Suicide Risk Assessment  Discharge Assessment     Demographic Factors:  Male, Adolescent or young adult, Low socioeconomic status and Living alone  Total Time spent with patient: 30 minutes  Psychiatric Specialty Exam:     Blood pressure 116/73, pulse 76, temperature 97.8 F (36.6 C), temperature source Oral, resp. rate 18, height 6' (1.829 m), weight 136.079 kg (300 lb), SpO2 98.00%.Body mass index is 40.68 kg/(m^2).  General Appearance: Casual  Eye Contact::  Good  Speech:  Clear and Coherent  Volume:  Normal  Mood:  Depressed  Affect:  Appropriate  Thought Process:  Coherent  Orientation:  Full (Time, Place, and Person)  Thought Content:  Hallucinations: Auditory  Suicidal Thoughts:  No  Homicidal Thoughts:  No  Memory:  Immediate;   Good Recent;   Good Remote;   Good  Judgement:  Fair  Insight:  Fair  Psychomotor Activity:  Normal  Concentration:  Good  Recall:  Good  Fund of Knowledge:Fair  Language: Good  Akathisia:  Negative  Handed:  Right  AIMS (if indicated):     Assets:  Communication Skills Desire for Improvement  Sleep:       Musculoskeletal: Strength & Muscle Tone: within normal limits Gait & Station: normal Patient leans: N/A   Mental Status Per Nursing Assessment::   On Admission:     Current Mental Status by Physician: NA  Loss Factors: NA  Historical Factors: NA  Risk Reduction Factors:   NA  Continued Clinical Symptoms:  Schizophrenia:   Depressive state  Cognitive Features That Contribute To Risk:  Closed-mindedness    Suicide Risk:  Minimal: No identifiable suicidal ideation.  Patients presenting with no risk factors but with morbid ruminations; may be classified as minimal risk based on the severity of the depressive symptoms  Discharge Diagnoses:   AXIS I:  Adjustment Disorder with Mixed Emotional Features AXIS II:  Deferred AXIS III:   Past Medical History  Diagnosis Date  . Mental disorder   . Hypertension   .  Depression   . Bipolar 1 disorder   . Irregular heart rate   . Asthma    AXIS IV:  chronic mental illness AXIS V:  61-70 mild symptoms  Plan Of Care/Follow-up recommendations:  Activity:  resume usual activity Diet:  resume usual diet  Is patient on multiple antipsychotic therapies at discharge:  No   Has Patient had three or more failed trials of antipsychotic monotherapy by history:  No  Recommended Plan for Multiple Antipsychotic Therapies: NA    Furious Chiarelli D 02/12/2014, 11:43 AM

## 2014-02-16 NOTE — ED Provider Notes (Signed)
Medical screening examination/treatment/procedure(s) were performed by non-physician practitioner and as supervising physician I was immediately available for consultation/collaboration.   EKG Interpretation None        Gricel Copen T Antion Andres, MD 02/16/14 0728 

## 2014-02-21 ENCOUNTER — Emergency Department (HOSPITAL_COMMUNITY)
Admission: EM | Admit: 2014-02-21 | Discharge: 2014-02-22 | Disposition: A | Discharge status: Other Acute Inpatient Hospital | Payer: No Typology Code available for payment source | Attending: Emergency Medicine | Admitting: Emergency Medicine

## 2014-02-21 ENCOUNTER — Encounter (HOSPITAL_COMMUNITY): Payer: Self-pay | Admitting: Emergency Medicine

## 2014-02-21 DIAGNOSIS — R51 Headache: Secondary | ICD-10-CM | POA: Insufficient documentation

## 2014-02-21 DIAGNOSIS — H5316 Psychophysical visual disturbances: Secondary | ICD-10-CM | POA: Insufficient documentation

## 2014-02-21 DIAGNOSIS — F1010 Alcohol abuse, uncomplicated: Secondary | ICD-10-CM

## 2014-02-21 DIAGNOSIS — F322 Major depressive disorder, single episode, severe without psychotic features: Secondary | ICD-10-CM

## 2014-02-21 DIAGNOSIS — F17200 Nicotine dependence, unspecified, uncomplicated: Secondary | ICD-10-CM | POA: Insufficient documentation

## 2014-02-21 DIAGNOSIS — F329 Major depressive disorder, single episode, unspecified: Secondary | ICD-10-CM | POA: Insufficient documentation

## 2014-02-21 DIAGNOSIS — R45851 Suicidal ideations: Secondary | ICD-10-CM | POA: Insufficient documentation

## 2014-02-21 DIAGNOSIS — F3289 Other specified depressive episodes: Secondary | ICD-10-CM | POA: Insufficient documentation

## 2014-02-21 DIAGNOSIS — I10 Essential (primary) hypertension: Secondary | ICD-10-CM | POA: Insufficient documentation

## 2014-02-21 DIAGNOSIS — F32A Depression, unspecified: Secondary | ICD-10-CM

## 2014-02-21 LAB — CBC WITH DIFFERENTIAL/PLATELET
BASOS PCT: 0 % (ref 0–1)
Basophils Absolute: 0 10*3/uL (ref 0.0–0.1)
Eosinophils Absolute: 0.2 10*3/uL (ref 0.0–0.7)
Eosinophils Relative: 3 % (ref 0–5)
HEMATOCRIT: 38.6 % — AB (ref 39.0–52.0)
Hemoglobin: 12.9 g/dL — ABNORMAL LOW (ref 13.0–17.0)
LYMPHS PCT: 28 % (ref 12–46)
Lymphs Abs: 1.7 10*3/uL (ref 0.7–4.0)
MCH: 29.6 pg (ref 26.0–34.0)
MCHC: 33.4 g/dL (ref 30.0–36.0)
MCV: 88.5 fL (ref 78.0–100.0)
MONO ABS: 0.7 10*3/uL (ref 0.1–1.0)
Monocytes Relative: 11 % (ref 3–12)
NEUTROS PCT: 58 % (ref 43–77)
Neutro Abs: 3.4 10*3/uL (ref 1.7–7.7)
PLATELETS: 247 10*3/uL (ref 150–400)
RBC: 4.36 MIL/uL (ref 4.22–5.81)
RDW: 12.6 % (ref 11.5–15.5)
WBC: 6 10*3/uL (ref 4.0–10.5)

## 2014-02-21 LAB — ETHANOL

## 2014-02-21 LAB — COMPREHENSIVE METABOLIC PANEL
ALT: 14 U/L (ref 0–53)
AST: 16 U/L (ref 0–37)
Albumin: 3.4 g/dL — ABNORMAL LOW (ref 3.5–5.2)
Alkaline Phosphatase: 106 U/L (ref 39–117)
Anion gap: 10 (ref 5–15)
BILIRUBIN TOTAL: 0.5 mg/dL (ref 0.3–1.2)
BUN: 14 mg/dL (ref 6–23)
CHLORIDE: 105 meq/L (ref 96–112)
CO2: 26 mEq/L (ref 19–32)
Calcium: 8.8 mg/dL (ref 8.4–10.5)
Creatinine, Ser: 0.91 mg/dL (ref 0.50–1.35)
GFR calc Af Amer: >90 mL/min (ref >90)
GFR calc non Af Amer: >90 mL/min (ref >90)
Glucose, Bld: 99 mg/dL (ref 70–99)
Potassium: 3.9 mEq/L (ref 3.7–5.3)
SODIUM: 141 meq/L (ref 137–147)
Total Protein: 6.1 g/dL (ref 6.0–8.3)

## 2014-02-21 LAB — SALICYLATE LEVEL: Salicylate Lvl: <2 mg/dL — ABNORMAL LOW (ref 2.8–20.0)

## 2014-02-21 LAB — URINALYSIS, ROUTINE W REFLEX MICROSCOPIC
Bilirubin Urine: NEGATIVE
GLUCOSE, UA: NEGATIVE mg/dL
Hgb urine dipstick: NEGATIVE
Ketones, ur: NEGATIVE mg/dL
Leukocytes, UA: NEGATIVE
Nitrite: NEGATIVE
PROTEIN: NEGATIVE mg/dL
SPECIFIC GRAVITY, URINE: 1.022 (ref 1.005–1.030)
Urobilinogen, UA: 1 mg/dL (ref 0.0–1.0)
pH: 6 (ref 5.0–8.0)

## 2014-02-21 LAB — RAPID URINE DRUG SCREEN, HOSP PERFORMED
Amphetamines: NOT DETECTED
Barbiturates: NOT DETECTED
Benzodiazepines: NOT DETECTED
Cocaine: NOT DETECTED
Opiates: NOT DETECTED
Tetrahydrocannabinol: NOT DETECTED

## 2014-02-21 LAB — POC OCCULT BLOOD, ED: Fecal Occult Bld: NEGATIVE

## 2014-02-21 LAB — ACETAMINOPHEN LEVEL: Acetaminophen (Tylenol), Serum: <15 ug/mL (ref 10–30)

## 2014-02-21 MED ORDER — ACETAMINOPHEN 325 MG PO TABS
650.0000 mg | ORAL_TABLET | ORAL | Status: DC | PRN
  Administered 2014-02-21 – 2014-02-22 (×2): 650 mg via ORAL
  Filled 2014-02-21 (×2): qty 2

## 2014-02-21 NOTE — BH Assessment (Signed)
Consulted with Dr. Lolly Mustache about PT disposition.  Per Dr. Lolly Mustache;  transfer PT to [facility] psy ED to work on medication.  Provided PT disposition to Dr. Rubin Payor.

## 2014-02-21 NOTE — BH Assessment (Signed)
Assessment Note  [name] [name] is an 33 y.o. male.  PT reported coming to [facility] b/c he is experiencing AH of voices commanding him to kill himself and harm others.  He denied a plan or intent to kill himself and reported only being told to harm and not kill others (no specific persons).  PT would not contract for safety.  PT reported experiencing SI and AH during the past 2 days.  He also reported not being able to sleep or eat during the past 2 days.  PT reported sx of depression, sadness, and hopelessness.  He denied any ETOH or illicit drug use.   PT was seen at [facility] on 02-11-2014/02-12-2014 and reported he did not have prescription filled that was given at the time of discharge.  PT reported last being seen by [facility] in 2014.  PT is currently homeless and living at the shelter.  He reported only local support is a Sister.        Axis I: Major Depression, Recurrent severe Axis II: Deferred Axis IV: economic problems, housing problems and problems with primary support group Axis V: 21-30 behavior considerably influenced by delusions or hallucinations OR serious impairment in judgment, communication OR inability to function in almost all areas  Past Medical History:  Past Medical History  Diagnosis Date  . Mental disorder   . Hypertension   . Depression   . Bipolar 1 disorder   . Irregular heart rate   . Asthma     History reviewed. No pertinent past surgical history.  Family History:  Family History  Problem Relation Age of Onset  . Hypertension Mother   . Hypertension Other     Social History:  reports that he has been smoking.  He has never used smokeless tobacco. He reports that he drinks alcohol. He reports that he does not use illicit drugs.  Additional Social History:     CIWA: CIWA-Ar BP: 138/73 mmHg Pulse Rate: 80 COWS:    Allergies: No Known Allergies  Home Medications:  (Not in a hospital admission)  OB/GYN Status:  No LMP for male patient.  General  Assessment Data Location of Assessment: [facility] ED ACT Assessment: Yes Is this a Tele or Face-to-Face Assessment?: Tele Assessment Is this an Initial Assessment or a Re-assessment for this encounter?: Initial Assessment Living Arrangements: Other (Comment) (Lives in the shelter) Can pt return to current living arrangement?: Yes Admission Status: Voluntary Is patient capable of signing voluntary admission?: Yes Transfer from: Other (Comment) (shelter) Referral Source: Self/Family/Friend  Medical Screening Exam [facility] Walk-in ONLY) Medical Exam completed: No Reason for MSE not completed: Other: (PT has to be seen by Doctor)  [facility] Crisis Care Plan Living Arrangements: Other (Comment) (Lives in the shelter) Name of Psychiatrist: Vesta Mixer Name of Therapist: None  Education Status Is patient currently in school?: No Current Grade: N/A Highest grade of school patient has completed: N/A Name of school: N/A Contact person: N/A  Risk to self with the past 6 months Suicidal Ideation: Yes-Currently Present Suicidal Intent: No-Not Currently/Within Last 6 Months Is patient at risk for suicide?: Yes Suicidal Plan?: No-Not Currently/Within Last 6 Months Access to Means: No What has been your use of drugs/alcohol within the last 12 months?: None Previous Attempts/Gestures: No How many times?: 0 Other Self Harm Risks: None Triggers for Past Attempts: None known Intentional Self Injurious Behavior: None Family Suicide History: Unknown Recent stressful life event(s): Other (Comment) (homeless) Persecutory voices/beliefs?: No Depression: Yes Depression Symptoms: Insomnia;Fatigue;Feeling worthless/self pity;Despondent  Substance abuse history and/or treatment for substance abuse?: No Suicide prevention information given to non-admitted patients: Not applicable  Risk to Others within the past 6 months Homicidal Ideation: Yes-Currently Present Thoughts of Harm to Others: Yes-Currently  Present Comment - Thoughts of Harm to Others: only to harm not kill, no one specific Current Homicidal Intent: No Current Homicidal Plan: No Access to Homicidal Means: No Identified Victim: None History of harm to others?: No Assessment of Violence: None Noted Violent Behavior Description: None Does patient have access to weapons?: No Criminal Charges Pending?: No Does patient have a court date: No  Psychosis Hallucinations: Auditory (coammand voice to kill self and harm others) Delusions: None noted  Mental Status Report Appear/Hygiene: In hospital gown Eye Contact: Poor Motor Activity: Unremarkable Speech: Logical/coherent Level of Consciousness: Alert Mood: Depressed;Sad;Other (Comment) (hopeless) Affect: Depressed;Sad;Flat Anxiety Level: Moderate Thought Processes: Coherent Judgement: Impaired Orientation: Person;Place;Time;Situation Obsessive Compulsive Thoughts/Behaviors: None  Cognitive Functioning Concentration: Fair Memory: Recent Intact;Remote Intact IQ: Average Insight: Poor Impulse Control: Poor Appetite: Poor Weight Loss: 0 Weight Gain: 0 Sleep: Decreased Total Hours of Sleep: 0 (for past 2 days) Vegetative Symptoms: None  ADLScreening [facility] Assessment Services) Patient's cognitive ability adequate to safely complete daily activities?: Yes Patient able to express need for assistance with ADLs?: Yes Independently performs ADLs?: Yes (appropriate for developmental age)  Prior Inpatient Therapy Prior Inpatient Therapy: Yes Prior Therapy Dates: 02-12-2014 Prior Therapy Facilty/Provider(s): Christell Constant, [facility], [place] Reason for Treatment: depression, SA  Prior Outpatient Therapy Prior Outpatient Therapy: Yes Prior Therapy Dates: 2014 Prior Therapy Facilty/Provider(s): [facility] Reason for Treatment: depression  ADL Screening (condition at time of admission) Patient's cognitive ability adequate to safely complete daily activities?: Yes Is the patient  deaf or have difficulty hearing?: No Does the patient have difficulty seeing, even when wearing glasses/contacts?: No Does the patient have difficulty concentrating, remembering, or making decisions?: No Patient able to express need for assistance with ADLs?: Yes Does the patient have difficulty dressing or bathing?: No Independently performs ADLs?: Yes (appropriate for developmental age) Does the patient have difficulty walking or climbing stairs?: No Weakness of Legs: None Weakness of Arms/Hands: None  Home Assistive Devices/Equipment Home Assistive Devices/Equipment: None    Abuse/Neglect Assessment (Assessment to be complete while patient is alone) Physical Abuse: Denies Verbal Abuse: Denies Sexual Abuse: Denies Exploitation of patient/patient's resources: Denies Self-Neglect: Denies Values / Beliefs Cultural Requests During Hospitalization: None Spiritual Requests During Hospitalization: None   Advance Directives (For Healthcare) Does patient have an advance directive?: No Would patient like information on creating an advanced directive?: No - patient declined information    Additional Information 1:1 In Past 12 Months?: No CIRT Risk: No Elopement Risk: No Does patient have medical clearance?: No     Disposition:  Disposition Initial Assessment Completed for this Encounter: Yes Disposition of Patient: Inpatient treatment program Type of inpatient treatment program: Adult Other disposition(s): Referred to outside facility  On Site Evaluation by:   Reviewed with Physician:    Dey-Johnson,Virda Betters 02/21/2014 10:25 AM

## 2014-02-21 NOTE — ED Notes (Signed)
Attempted to call report to [facility] psych ED.  Charge RN Pattie stated that there are no rooms available at this time and she will call POD C RN when room is available.

## 2014-02-21 NOTE — BH Assessment (Signed)
Consulted with Dr. Rubin Payor about PT. Scheduled tele-assessment with Clydie Braun.

## 2014-02-21 NOTE — ED Notes (Signed)
Pt reports having suicidal thoughts for the past day, no attempts. Calm and cooperative at triage.

## 2014-02-21 NOTE — ED Provider Notes (Signed)
CSN: [id number]     Arrival date & time 02/21/14  0715 History   First MD Initiated Contact with Patient 02/21/14 [phone]     Chief Complaint  Patient presents with  . Suicidal  . Medical Clearance     (Consider location/radiation/quality/duration/timing/severity/associated sxs/prior Treatment) The history is provided by the patient. No language interpreter was used.  [name] [name] is a 33 year old male with past medical history of mental disorder, hypertension, depression, bipolar 1 disorder, asthma presenting to the emergency department with suicidal ideation and auditory hallucinations that started yesterday at approximately 7:00-8:00 PM. Patient reports he's been having thoughts of hurting himself, denies plan. Stated that he has been hearing voices that are telling him to hurt himself and others, reported that he has been hearing voices telling him to do "not good things." Stated that he has not been sleeping or eating over the past 3 days. Stated that he's been having increasing sadness. Stated that he's been off his medications for approximately one year. States that he lives at home. Reported that he has been having a headache - denied sudden onset/thunderclap onset - stated that the headache is a typical headache, stated that he has beensd using Tylenol with some relief. Denied fever, chills, chest pain, shortness of breath, difficulty breathing, neck pain, neck stiffness, nausea, vomiting, diarrhea, abdominal pain, visual distortions, visual hallucinations, homicidal ideations, active self injury. Denied alcohol use, cocaine use, marijuana use, cigarettes, heroin use. PCP none Psychiatrist/psychologist none  Past Medical History  Diagnosis Date  . Mental disorder   . Hypertension   . Depression   . Bipolar 1 disorder   . Irregular heart rate   . Asthma    History reviewed. No pertinent past surgical history. Family History  Problem Relation Age of Onset  . Hypertension Mother    . Hypertension Other    History  Substance Use Topics  . Smoking status: Current Every Day Smoker -- 0.50 packs/day  . Smokeless tobacco: Never Used  . Alcohol Use: 0.0 oz/week     Comment: pt states he has been drinking 12 beers and 6 shots daily for several weeks    Review of Systems  Constitutional: Negative for fever and chills.  Eyes: Negative for visual disturbance.  Respiratory: Negative for chest tightness and shortness of breath.   Cardiovascular: Negative for chest pain.  Gastrointestinal: Negative for nausea, vomiting, abdominal pain and diarrhea.  Genitourinary: Negative for dysuria, hematuria and decreased urine volume.  Musculoskeletal: Negative for back pain, neck pain and neck stiffness.  Neurological: Positive for headaches. Negative for weakness and numbness.  Psychiatric/Behavioral: Positive for suicidal ideas, hallucinations (auditory) and dysphoric mood. Negative for confusion and self-injury.      Allergies  Review of patient's allergies indicates no known allergies.  Home Medications   Prior to Admission medications   Medication Sig Start Date End Date Taking? Authorizing Provider  acetaminophen (TYLENOL) 500 MG tablet Take 500-1,500 mg by mouth every 6 (six) hours as needed for mild pain.    Yes Historical Provider, MD  DULoxetine (CYMBALTA) 60 MG capsule Take 1 capsule (60 mg total) by mouth daily. 02/12/14  Yes Benjaman Pott, MD  gabapentin (NEURONTIN) 300 MG capsule Take 1 capsule (300 mg total) by mouth 3 (three) times daily. 02/12/14  Yes Benjaman Pott, MD  traZODone (DESYREL) 100 MG tablet Take 1 tablet (100 mg total) by mouth at bedtime. 02/12/14  Yes Benjaman Pott, MD   BP 120/78  Pulse 68  Temp(Src) 98.1 F (36.7 C) (Oral)  Resp 18  SpO2 99% Physical Exam  Nursing note and vitals reviewed. Constitutional: He is oriented to person, place, and time. He appears well-developed and well-nourished. No distress.  HENT:  Head: Normocephalic  and atraumatic.  Mouth/Throat: Oropharynx is clear and moist. No oropharyngeal exudate.  Eyes: Conjunctivae and EOM are normal. Pupils are equal, round, and reactive to light. Right eye exhibits no discharge. Left eye exhibits no discharge.  Neck: Normal range of motion. Neck supple. No tracheal deviation present.  Negative neck stiffness Negative nuchal rigidity Negative cervical lymphadenopathy Negative meningeal signs  Cardiovascular: Normal rate, regular rhythm and normal heart sounds.  Exam reveals no friction rub.   No murmur heard. Pulses:      Radial pulses are 2+ on the right side, and 2+ on the left side.       Dorsalis pedis pulses are 2+ on the right side, and 2+ on the left side.  Cap refill less than 3 seconds  Pulmonary/Chest: Effort normal and breath sounds normal. No respiratory distress. He has no wheezes. He has no rales. He exhibits no tenderness.  Patient is able to speak in full sentences without difficulty Negative use of accessory muscles Negative stridor  Abdominal: Soft. Bowel sounds are normal. He exhibits no distension. There is no tenderness. There is no rebound and no guarding.  Negative abdominal distention Bowel sounds normoactive in all 4 quadrants Abdomen soft upon palpation Negative peritoneal signs Negative guarding or rigidity noted  Genitourinary:  Rectal exam: Negative erythema, inflammation, lesions, sores, deformities identified to the anus. Negative interna external hemorrhoids identified. Negative blood on glove. Strong tone. Exam chaperoned with nurse.   Musculoskeletal: Normal range of motion.  Full ROM to upper and lower extremities without difficulty noted, negative ataxia noted.  Lymphadenopathy:    He has no cervical adenopathy.  Neurological: He is alert and oriented to person, place, and time. No cranial nerve deficit. He exhibits normal muscle tone. Coordination normal.  Cranial nerves III-XII grossly intact Strength 5+/5+ to upper  and lower extremities bilaterally with resistance applied, equal distribution noted Equal grip strength bilaterally Sensation intact Negative facial droop Negative slurred speech Negative aphasia Patient is able to bring finger to nose bilaterally without difficulty or ataxia Patient follows commands well Patient response to questions appropriately Negative arm drift Fine motor skills intact Heel to knee down shin normal bilaterally Gait proper, proper balance - negative sway, negative drift, negative step-offs  Skin: Skin is warm and dry. No rash noted. He is not diaphoretic. No erythema.  Psychiatric: He is actively hallucinating (auditory). He expresses suicidal ideation.  Flat affect    ED Course  Procedures (including critical care time)  Results for orders placed during the hospital encounter of 02/21/14  CBC WITH DIFFERENTIAL      Result Value Ref Range   WBC 6.0  4.0 - 10.5 K/uL   RBC 4.36  4.22 - 5.81 MIL/uL   Hemoglobin 12.9 (*) 13.0 - 17.0 g/dL   HCT 16.1 (*) 09.6 - 04.5 %   MCV 88.5  78.0 - 100.0 fL   MCH 29.6  26.0 - 34.0 pg   MCHC 33.4  30.0 - 36.0 g/dL   RDW 40.9  81.1 - 91.4 %   Platelets 247  150 - 400 K/uL   Neutrophils Relative % 58  43 - 77 %   Neutro Abs 3.4  1.7 - 7.7 K/uL   Lymphocytes Relative 28  12 -  46 %   Lymphs Abs 1.7  0.7 - 4.0 K/uL   Monocytes Relative 11  3 - 12 %   Monocytes Absolute 0.7  0.1 - 1.0 K/uL   Eosinophils Relative 3  0 - 5 %   Eosinophils Absolute 0.2  0.0 - 0.7 K/uL   Basophils Relative 0  0 - 1 %   Basophils Absolute 0.0  0.0 - 0.1 K/uL  COMPREHENSIVE METABOLIC PANEL      Result Value Ref Range   Sodium 141  137 - 147 mEq/L   Potassium 3.9  3.7 - 5.3 mEq/L   Chloride 105  96 - 112 mEq/L   CO2 26  19 - 32 mEq/L   Glucose, Bld 99  70 - 99 mg/dL   BUN 14  6 - 23 mg/dL   Creatinine, Ser 5.78  0.50 - 1.35 mg/dL   Calcium 8.8  8.4 - 46.9 mg/dL   Total Protein 6.1  6.0 - 8.3 g/dL   Albumin 3.4 (*) 3.5 - 5.2 g/dL   AST 16   0 - 37 U/L   ALT 14  0 - 53 U/L   Alkaline Phosphatase 106  39 - 117 U/L   Total Bilirubin 0.5  0.3 - 1.2 mg/dL   GFR calc non Af Amer >90  >90 mL/min   GFR calc Af Amer >90  >90 mL/min   Anion gap 10  5 - 15  ETHANOL      Result Value Ref Range   Alcohol, Ethyl (B) <11  0 - 11 mg/dL  SALICYLATE LEVEL      Result Value Ref Range   Salicylate Lvl <2.0 (*) 2.8 - 20.0 mg/dL  ACETAMINOPHEN LEVEL      Result Value Ref Range   Acetaminophen (Tylenol), Serum <15.0  10 - 30 ug/mL  URINALYSIS, ROUTINE W REFLEX MICROSCOPIC      Result Value Ref Range   Color, Urine YELLOW  YELLOW   APPearance CLEAR  CLEAR   Specific Gravity, Urine 1.022  1.005 - 1.030   pH 6.0  5.0 - 8.0   Glucose, UA NEGATIVE  NEGATIVE mg/dL   Hgb urine dipstick NEGATIVE  NEGATIVE   Bilirubin Urine NEGATIVE  NEGATIVE   Ketones, ur NEGATIVE  NEGATIVE mg/dL   Protein, ur NEGATIVE  NEGATIVE mg/dL   Urobilinogen, UA 1.0  0.0 - 1.0 mg/dL   Nitrite NEGATIVE  NEGATIVE   Leukocytes, UA NEGATIVE  NEGATIVE  URINE RAPID DRUG SCREEN (HOSP PERFORMED)      Result Value Ref Range   Opiates NONE DETECTED  NONE DETECTED   Cocaine NONE DETECTED  NONE DETECTED   Benzodiazepines NONE DETECTED  NONE DETECTED   Amphetamines NONE DETECTED  NONE DETECTED   Tetrahydrocannabinol NONE DETECTED  NONE DETECTED   Barbiturates NONE DETECTED  NONE DETECTED  POC OCCULT BLOOD, ED      Result Value Ref Range   Fecal Occult Bld NEGATIVE  NEGATIVE    Labs Review Labs Reviewed  CBC WITH DIFFERENTIAL - Abnormal; Notable for the following:    Hemoglobin 12.9 (*)    HCT 38.6 (*)    All other components within normal limits  COMPREHENSIVE METABOLIC PANEL - Abnormal; Notable for the following:    Albumin 3.4 (*)    All other components within normal limits  SALICYLATE LEVEL - Abnormal; Notable for the following:    Salicylate Lvl <2.0 (*)    All other components within normal limits  ETHANOL  ACETAMINOPHEN  LEVEL  URINALYSIS, ROUTINE W REFLEX  MICROSCOPIC  URINE RAPID DRUG SCREEN (HOSP PERFORMED)  OCCULT BLOOD X 1 CARD TO LAB, STOOL  POC OCCULT BLOOD, ED    Imaging Review No results found.   EKG Interpretation None      MDM   Final diagnoses:  Depression  Suicidal ideation   Medications  acetaminophen (TYLENOL) tablet 650 mg (not administered)   Filed Vitals:   02/21/14 0719 02/21/14 1100  BP: 138/73 120/78  Pulse: 80 68  Temp: 97.3 F (36.3 C) 98.1 F (36.7 C)  TempSrc: Oral   Resp: 18 18  SpO2: 99% 99%   CBC unremarkable - mild drop in hemoglobin when compared to approximately one week ago-hemoglobin was 14.3 one week ago, today patient is 12.9. CMP unremarkable. Negative elevation in ethanol, salicylate, acetaminophen levels. Urinalysis negative for nitrites, leukocytes. Urine drug screen negative. Fecal occult negative.  Patient stable, afebrile. Patient not septic appearing. Negative focal neurological deficits - doubt meningitis, doubt SAH - headache to be typical headache. Patient medically cleared. Psych hold orders have been placed. TTS consult has been ordered.  Raymon Mutton, PA-C 02/21/14 1837  Raymon Mutton, PA-C 02/22/14 0008

## 2014-02-21 NOTE — ED Notes (Signed)
Pt states he is "feeling better today".  Denies HI/SI

## 2014-02-21 NOTE — ED Notes (Signed)
Called [facility] for transport. 

## 2014-02-21 NOTE — ED Notes (Signed)
Touched base with [facility] ED and charge stated a room may be coming open soon and they will call when ready.

## 2014-02-21 NOTE — ED Notes (Signed)
Pt belongings taken from bedside, tagged and placed at the nurses station.

## 2014-02-21 NOTE — ED Provider Notes (Addendum)
  Physical Exam  BP 120/78  Pulse 68  Temp(Src) 98.1 F (36.7 C) (Oral)  Resp 18  SpO2 99%  Physical Exam  ED Course  Procedures  MDM Dr Lolly Mustache requests transfer to [name] ED. D/w Dr Lynelle Doctor. Will transfer. Will require transfer for med recommendations      Juliet Rude. Rubin Payor, MD 02/21/14 1430  Medical screening examination/treatment/procedure(s) were performed by non-physician practitioner and as supervising physician I was immediately available for consultation/collaboration.   EKG Interpretation None       Juliet Rude. Rubin Payor, MD 03/02/14 [phone]

## 2014-02-21 NOTE — ED Notes (Addendum)
Patient presents calm and cooperative; states that he SI and is hearing voices telling him to "hurt himself". States that the voices started 2 days ago. Denies any VH/HI.NAD

## 2014-02-21 NOTE — ED Notes (Signed)
TTS completed. 

## 2014-02-22 ENCOUNTER — Encounter (HOSPITAL_COMMUNITY): Payer: Self-pay | Admitting: *Deleted

## 2014-02-22 ENCOUNTER — Inpatient Hospital Stay (HOSPITAL_COMMUNITY)
Admission: AD | Admit: 2014-02-22 | Discharge: 2014-03-03 | DRG: 885 | Disposition: A | Discharge status: Home / Self Care | Payer: Federal, State, Local not specified - Other | Source: Intra-hospital | Attending: Psychiatry | Admitting: Psychiatry

## 2014-02-22 ENCOUNTER — Encounter (HOSPITAL_COMMUNITY): Payer: Self-pay | Admitting: Registered Nurse

## 2014-02-22 DIAGNOSIS — G4700 Insomnia, unspecified: Secondary | ICD-10-CM | POA: Diagnosis present

## 2014-02-22 DIAGNOSIS — Z23 Encounter for immunization: Secondary | ICD-10-CM

## 2014-02-22 DIAGNOSIS — Z5900 Homelessness unspecified: Secondary | ICD-10-CM | POA: Diagnosis not present

## 2014-02-22 DIAGNOSIS — Z87891 Personal history of nicotine dependence: Secondary | ICD-10-CM | POA: Diagnosis not present

## 2014-02-22 DIAGNOSIS — Z8249 Family history of ischemic heart disease and other diseases of the circulatory system: Secondary | ICD-10-CM

## 2014-02-22 DIAGNOSIS — Z5987 Material hardship due to limited financial resources, not elsewhere classified: Secondary | ICD-10-CM

## 2014-02-22 DIAGNOSIS — F1010 Alcohol abuse, uncomplicated: Secondary | ICD-10-CM | POA: Diagnosis present

## 2014-02-22 DIAGNOSIS — F322 Major depressive disorder, single episode, severe without psychotic features: Secondary | ICD-10-CM | POA: Diagnosis present

## 2014-02-22 DIAGNOSIS — J45909 Unspecified asthma, uncomplicated: Secondary | ICD-10-CM | POA: Diagnosis present

## 2014-02-22 DIAGNOSIS — Z598 Other problems related to housing and economic circumstances: Secondary | ICD-10-CM | POA: Diagnosis not present

## 2014-02-22 DIAGNOSIS — Z5989 Other problems related to housing and economic circumstances: Secondary | ICD-10-CM | POA: Diagnosis not present

## 2014-02-22 DIAGNOSIS — I10 Essential (primary) hypertension: Secondary | ICD-10-CM | POA: Diagnosis present

## 2014-02-22 DIAGNOSIS — F411 Generalized anxiety disorder: Secondary | ICD-10-CM | POA: Diagnosis present

## 2014-02-22 DIAGNOSIS — R45851 Suicidal ideations: Secondary | ICD-10-CM

## 2014-02-22 DIAGNOSIS — F333 Major depressive disorder, recurrent, severe with psychotic symptoms: Secondary | ICD-10-CM | POA: Diagnosis present

## 2014-02-22 DIAGNOSIS — F323 Major depressive disorder, single episode, severe with psychotic features: Secondary | ICD-10-CM

## 2014-02-22 DIAGNOSIS — F319 Bipolar disorder, unspecified: Secondary | ICD-10-CM | POA: Diagnosis present

## 2014-02-22 MED ORDER — GABAPENTIN 100 MG PO CAPS
100.0000 mg | ORAL_CAPSULE | Freq: Three times a day (TID) | ORAL | Status: DC
  Administered 2014-02-22: 100 mg via ORAL
  Filled 2014-02-22: qty 1

## 2014-02-22 MED ORDER — DULOXETINE HCL 60 MG PO CPEP
60.0000 mg | ORAL_CAPSULE | Freq: Every day | ORAL | Status: DC
  Administered 2014-02-23 – 2014-03-03 (×9): 60 mg via ORAL
  Filled 2014-02-22 (×11): qty 1

## 2014-02-22 MED ORDER — GABAPENTIN 100 MG PO CAPS
100.0000 mg | ORAL_CAPSULE | Freq: Three times a day (TID) | ORAL | Status: DC
  Administered 2014-02-23 – 2014-02-25 (×8): 100 mg via ORAL
  Filled 2014-02-22 (×12): qty 1

## 2014-02-22 MED ORDER — DULOXETINE HCL 60 MG PO CPEP
60.0000 mg | ORAL_CAPSULE | Freq: Every day | ORAL | Status: DC
  Administered 2014-02-22: 60 mg via ORAL
  Filled 2014-02-22: qty 1

## 2014-02-22 MED ORDER — PNEUMOCOCCAL VAC POLYVALENT 25 MCG/0.5ML IJ INJ
0.5000 mL | INJECTION | INTRAMUSCULAR | Status: AC
  Administered 2014-02-23: 0.5 mL via INTRAMUSCULAR

## 2014-02-22 MED ORDER — MAGNESIUM HYDROXIDE 400 MG/5ML PO SUSP: 30.0000 mL | Freq: Every day | ORAL | Status: DC | PRN

## 2014-02-22 MED ORDER — ACETAMINOPHEN 325 MG PO TABS: 650.0000 mg | ORAL_TABLET | ORAL | Status: DC | PRN

## 2014-02-22 MED ORDER — ALUM & MAG HYDROXIDE-SIMETH 200-200-20 MG/5ML PO SUSP
30.0000 mL | ORAL | Status: DC | PRN
  Administered 2014-02-26: 30 mL via ORAL

## 2014-02-22 MED ORDER — TRAZODONE HCL 100 MG PO TABS
100.0000 mg | ORAL_TABLET | Freq: Every day | ORAL | Status: DC
  Administered 2014-02-22 – 2014-02-28 (×7): 100 mg via ORAL
  Filled 2014-02-22 (×10): qty 1

## 2014-02-22 NOTE — Progress Notes (Signed)
Report received from B. McNichols RN. Writer entered patients room and observed him lying in bed asleep, no distress noted. Safety maintained on unit with 15 min checks. 

## 2014-02-22 NOTE — Consult Note (Signed)
[facility] Consult   Reason for Consult:  Depression, suicidal ideations Referring Physician:  ED Physician [name] [name] is an 33 y.o. male. Total Time spent with patient: 30 minutes  Assessment: AXIS I:  MDD, with psychotic features AXIS II:  Deferred AXIS III:   Past Medical History  Diagnosis Date  . Mental disorder   . Hypertension   . Depression   . Bipolar 1 disorder   . Irregular heart rate   . Asthma    AXIS IV:  homelessness, limited support network AXIS V:  41-50 serious symptoms  Plan:  Recommend psychiatric Inpatient admission when medically cleared.  Subjective:   [name] [name] is a 33 y.o. male patient admitted with depression  And report of suicidal ideations  HPI:  Patient is a 33 year old man, who describes worsening depression. He describes neuro-vegetative symptoms of depression such as anhedonia, poor sleep, low energy, decreased sense of self esteem, anhedonia. He describes onset of auditory hallucinations, telling him to hurt himself, sometimes hears laughing as well. He has developed suicidal thoughts, without specific plan  At this time. He states " I knew I had to come in to the hospital" He describes a history of depression, and has been on Cymbalta , Trazodone, and Neurontin in the past. He feels this medication regimen was helping, but has not been taking any medications recently. He has been seen as outpatient at [facility] but has not returned recently. He denies any drug or alcohol abuse. He denies any drug or alcohol abuse at this time. He is single, homeless, staying in a shelter, no children, no significant other at this time, no legal issues reported No contact with family members - parents deceased. ROS- (+) headaches, no chest pain, no shortness of breath, no coughing, no Dysuria, no melenas, no peripheral edema, no rash. HPI Elements:    Chronic depression, recently exacerbated in the context of medication non  compliance and significant psycho social stressors to include homelessness.  Past Psychiatric History: Past Medical History  Diagnosis Date  . Mental disorder   . Hypertension   . Depression   . Bipolar 1 disorder   . Irregular heart rate   . Asthma     reports that he has been smoking.  He has never used smokeless tobacco. He reports that he drinks alcohol. He reports that he does not use illicit drugs. Family History  Problem Relation Age of Onset  . Hypertension Mother   . Hypertension Other    Family History Substance Abuse: No Family Supports: Yes, List: (Sister) Living Arrangements: Other (Comment) (Lives in the shelter) Can pt return to current living arrangement?: Yes Abuse/Neglect [facility]) Physical Abuse: Denies Verbal Abuse: Denies Sexual Abuse: Denies Allergies:  No Known Allergies   Objective: Blood pressure 124/75, pulse 71, temperature 98.2 F (36.8 C), temperature source Oral, resp. rate 17, SpO2 98.00%.There is no weight on file to calculate BMI. Results for orders placed during the hospital encounter of 02/21/14 (from the past 72 hour(s))  URINALYSIS, ROUTINE W REFLEX MICROSCOPIC     Status: None   Collection Time    02/21/14  7:25 AM      Result Value Ref Range   Color, Urine YELLOW  YELLOW   APPearance CLEAR  CLEAR   Specific Gravity, Urine 1.022  1.005 - 1.030   pH 6.0  5.0 - 8.0   Glucose, UA NEGATIVE  NEGATIVE mg/dL   Hgb urine dipstick NEGATIVE  NEGATIVE  Bilirubin Urine NEGATIVE  NEGATIVE   Ketones, ur NEGATIVE  NEGATIVE mg/dL   Protein, ur NEGATIVE  NEGATIVE mg/dL   Urobilinogen, UA 1.0  0.0 - 1.0 mg/dL   Nitrite NEGATIVE  NEGATIVE   Leukocytes, UA NEGATIVE  NEGATIVE   Comment: MICROSCOPIC NOT DONE ON URINES WITH NEGATIVE PROTEIN, BLOOD, LEUKOCYTES, NITRITE, OR GLUCOSE <1000 mg/dL.  URINE RAPID DRUG SCREEN (HOSP PERFORMED)     Status: None   Collection Time    02/21/14  7:25 AM      Result Value Ref Range   Opiates NONE DETECTED  NONE  DETECTED   Cocaine NONE DETECTED  NONE DETECTED   Benzodiazepines NONE DETECTED  NONE DETECTED   Amphetamines NONE DETECTED  NONE DETECTED   Tetrahydrocannabinol NONE DETECTED  NONE DETECTED   Barbiturates NONE DETECTED  NONE DETECTED   Comment:            DRUG SCREEN FOR MEDICAL PURPOSES     ONLY.  IF CONFIRMATION IS NEEDED     FOR ANY PURPOSE, NOTIFY LAB     WITHIN 5 DAYS.                LOWEST DETECTABLE LIMITS     FOR URINE DRUG SCREEN     Drug Class       Cutoff (ng/mL)     Amphetamine      1000     Barbiturate      200     Benzodiazepine   242     Tricyclics       683     Opiates          300     Cocaine          300     THC              50  CBC WITH DIFFERENTIAL     Status: Abnormal   Collection Time    02/21/14  7:52 AM      Result Value Ref Range   WBC 6.0  4.0 - 10.5 K/uL   RBC 4.36  4.22 - 5.81 MIL/uL   Hemoglobin 12.9 (*) 13.0 - 17.0 g/dL   HCT 38.6 (*) 39.0 - 52.0 %   MCV 88.5  78.0 - 100.0 fL   MCH 29.6  26.0 - 34.0 pg   MCHC 33.4  30.0 - 36.0 g/dL   RDW 12.6  11.5 - 15.5 %   Platelets 247  150 - 400 K/uL   Neutrophils Relative % 58  43 - 77 %   Neutro Abs 3.4  1.7 - 7.7 K/uL   Lymphocytes Relative 28  12 - 46 %   Lymphs Abs 1.7  0.7 - 4.0 K/uL   Monocytes Relative 11  3 - 12 %   Monocytes Absolute 0.7  0.1 - 1.0 K/uL   Eosinophils Relative 3  0 - 5 %   Eosinophils Absolute 0.2  0.0 - 0.7 K/uL   Basophils Relative 0  0 - 1 %   Basophils Absolute 0.0  0.0 - 0.1 K/uL  COMPREHENSIVE METABOLIC PANEL     Status: Abnormal   Collection Time    02/21/14  7:52 AM      Result Value Ref Range   Sodium 141  137 - 147 mEq/L   Potassium 3.9  3.7 - 5.3 mEq/L   Chloride 105  96 - 112 mEq/L   CO2 26  19 - 32 mEq/L  Glucose, Bld 99  70 - 99 mg/dL   BUN 14  6 - 23 mg/dL   Creatinine, Ser 0.91  0.50 - 1.35 mg/dL   Calcium 8.8  8.4 - 10.5 mg/dL   Total Protein 6.1  6.0 - 8.3 g/dL   Albumin 3.4 (*) 3.5 - 5.2 g/dL   AST 16  0 - 37 U/L   ALT 14  0 - 53 U/L    Alkaline Phosphatase 106  39 - 117 U/L   Total Bilirubin 0.5  0.3 - 1.2 mg/dL   GFR calc non Af Amer >90  >90 mL/min   GFR calc Af Amer >90  >90 mL/min   Comment: (NOTE)     The eGFR has been calculated using the CKD EPI equation.     This calculation has not been validated in all clinical situations.     eGFR's persistently <90 mL/min signify possible Chronic Kidney     Disease.   Anion gap 10  5 - 15  ETHANOL     Status: None   Collection Time    02/21/14  7:52 AM      Result Value Ref Range   Alcohol, Ethyl (B) <11  0 - 11 mg/dL   Comment:            LOWEST DETECTABLE LIMIT FOR     SERUM ALCOHOL IS 11 mg/dL     FOR MEDICAL PURPOSES ONLY  SALICYLATE LEVEL     Status: Abnormal   Collection Time    02/21/14  7:52 AM      Result Value Ref Range   Salicylate Lvl <9.7 (*) 2.8 - 20.0 mg/dL  ACETAMINOPHEN LEVEL     Status: None   Collection Time    02/21/14  7:52 AM      Result Value Ref Range   Acetaminophen (Tylenol), Serum <15.0  10 - 30 ug/mL   Comment:            THERAPEUTIC CONCENTRATIONS VARY     SIGNIFICANTLY. A RANGE OF 10-30     ug/mL MAY BE AN EFFECTIVE     CONCENTRATION FOR MANY PATIENTS.     HOWEVER, SOME ARE BEST TREATED     AT CONCENTRATIONS OUTSIDE THIS     RANGE.     ACETAMINOPHEN CONCENTRATIONS     >150 ug/mL AT 4 HOURS AFTER     INGESTION AND >50 ug/mL AT 12     HOURS AFTER INGESTION ARE     OFTEN ASSOCIATED WITH TOXIC     REACTIONS.  POC OCCULT BLOOD, ED     Status: None   Collection Time    02/21/14  9:27 AM      Result Value Ref Range   Fecal Occult Bld NEGATIVE  NEGATIVE   Labs are reviewed and are pertinent for  unremarkable  Current Facility-Administered Medications  Medication Dose Route Frequency Provider Last Rate Last Dose  . acetaminophen (TYLENOL) tablet 650 mg  650 mg Oral Q4H PRN Marissa Sciacca, PA-C   650 mg at 02/22/14 1251   Current Outpatient Prescriptions  Medication Sig Dispense Refill  . acetaminophen (TYLENOL) 500 MG tablet  Take 500-1,500 mg by mouth every 6 (six) hours as needed for mild pain.       . DULoxetine (CYMBALTA) 60 MG capsule Take 1 capsule (60 mg total) by mouth daily.  30 capsule  0  . gabapentin (NEURONTIN) 300 MG capsule Take 1 capsule (300 mg total) by mouth 3 (three)  times daily.  90 capsule  0  . traZODone (DESYREL) 100 MG tablet Take 1 tablet (100 mg total) by mouth at bedtime.  30 tablet  0  . [DISCONTINUED] buPROPion (WELLBUTRIN XL) 150 MG 24 hr tablet Take 150 mg by mouth at bedtime.         Psychiatric Specialty Exam:     Blood pressure 124/75, pulse 71, temperature 98.2 F (36.8 C), temperature source Oral, resp. rate 17, SpO2 98.00%.There is no weight on file to calculate BMI.  General Appearance: Fairly Groomed  Engineer, water::  Good  Speech:  Normal Rate  Volume:  Normal  Mood:  Depressed and sad  Affect:  Constricted  Thought Process:  Goal Directed and Linear  Orientation:  Other:  fully alert and attentive   Thought Content:  (+) hallucinations as described above. At this time does not appear internally preoccupied , no delusions expressed   Suicidal Thoughts:  Yes.  without intent/plan- at this time denies plan or intention of hurting self   Homicidal Thoughts:  No  Memory:  recent and remote grossly intact   Judgement:  Fair  Insight:  Fair  Psychomotor Activity:  Decreased  Concentration:  Good  Recall:  Good  Fund of Knowledge:Good  Language: Good  Akathisia:  Negative  Handed:  Right  AIMS (if indicated):     Assets:  Desire for Improvement Resilience  Sleep:      Musculoskeletal: Strength & Muscle Tone: within normal limits Gait & Station: normal Patient leans: N/A  Treatment Plan Summary: 1. At this time it is felt that patient warrants inpatient psychiatric admission. Patient agrees to sign in voluntarily. 2. Treatment team will work on locating appropriate inpatient psychiatric bed for patient. 3. Consider restarting medications that patient states were  helping him - Cymbalta 60 mgrs QDAY, Neurontin 100 mgrs TID COBOS, FERNANDO 02/22/2014 1:36 PM

## 2014-02-22 NOTE — Progress Notes (Signed)
MHT contacted the following facilities with bed availability for inpatient treatment:  1)[facility]-no beds 2)[facility]-no beds 3)[facility]-faxed referral 4)[facility]-faxed referral 5)[facility]-no beds 6)[facility]-faxed referral 7)[facility]-faxed referral 8)[facility]-no beds 9)[facility]-no beds 10)[facility]-no beds  [name] [name], MHT/NS

## 2014-02-22 NOTE — Tx Team (Signed)
Initial Interdisciplinary Treatment Plan   PATIENT STRESSORS: Medication change or noncompliance   PROBLEM LIST: Problem List/Patient Goals Date to be addressed Date deferred Reason deferred Estimated date of resolution  Depression 02/22/14     "I want to get back on my medicine" 02/22/14     Auditory hallucinations 02/22/14                                          DISCHARGE CRITERIA:  Ability to meet basic life and health needs Improved stabilization in mood, thinking, and/or behavior Verbal commitment to aftercare and medication compliance  PRELIMINARY DISCHARGE PLAN: Attend aftercare/continuing care group  PATIENT/FAMIILY INVOLVEMENT: This treatment plan has been presented to and reviewed with the patient, [name] [name], and/or family member, .  The patient and family have been given the opportunity to ask questions and make suggestions.  [name], [place] 02/22/2014, 9:26 PM

## 2014-02-22 NOTE — Progress Notes (Signed)
33 year old male pt admitted on voluntary basis. Pt reports that he has been depressed, having auditory hallucinations and does endorse suicidal thoughts but able to contract for safety on the unit. Pt reports that he is homeless and plans to go back to the shelter after discharge. Pt reports that he has been off his medications for approximately the past 7 months and spoke about how he was feeling ok at first but then slowly began feeling bad. Pt reports that he would like to get back on his medications while he is here. Pt was provided a meal, oriented to the unit and safety maintained.

## 2014-02-23 ENCOUNTER — Encounter (HOSPITAL_COMMUNITY): Payer: Self-pay | Admitting: Psychiatry

## 2014-02-23 DIAGNOSIS — F1010 Alcohol abuse, uncomplicated: Secondary | ICD-10-CM

## 2014-02-23 DIAGNOSIS — F332 Major depressive disorder, recurrent severe without psychotic features: Secondary | ICD-10-CM

## 2014-02-23 DIAGNOSIS — F333 Major depressive disorder, recurrent, severe with psychotic symptoms: Secondary | ICD-10-CM | POA: Diagnosis present

## 2014-02-23 MED ORDER — QUETIAPINE FUMARATE 100 MG PO TABS
100.0000 mg | ORAL_TABLET | Freq: Every day | ORAL | Status: DC
Start: 2014-02-23 — End: 2014-02-25
  Administered 2014-02-23 – 2014-02-24 (×2): 100 mg via ORAL
  Filled 2014-02-23 (×4): qty 1

## 2014-02-23 NOTE — [facility] Group Notes (Signed)
[facility] LCSW Group Therapy          Overcoming Obstacles       1:15 -2:30        02/23/2014       Type of Therapy:  Group Therapy  Participation Level:  Did not attend - patient was sleeping.   [name] 02/23/2014
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
## 2014-02-23 NOTE — Clinical Social Work Note (Signed)
CSW met with patient to complete PSA. Patient is known from previous admission.  He advised he had been living in another county for a while but returned to [place].  He endorsing SI but able to contract for safety.  Patient advised of being off medications for a while and that he is having A/H.  He shared is homeless and hopes to get into [place] at discharge.  Patient was asked if he needed assistance in making application for disability.  He first stated he wanted help but after learning it could take while to be approved he declined.  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  Patient shared he has no support system.

## 2014-02-23 NOTE — [facility] Group Notes (Signed)
[facility] LCSW Aftercare Discharge Planning Group Note   02/23/2014 8:33 AM    Participation Quality:  Did not attend group -sleeping.   [name] [name], [name]

## 2014-02-23 NOTE — [facility] Suicide Risk Assessment (Signed)
[facility] INPATIENT:  Family/Significant Other Suicide Prevention Education  Suicide Prevention Education:  Patient Refusal for Family/Significant Other Suicide Prevention Education: The patient [name] [name] has refused to provide written consent for family/significant other to be provided Family/Significant Other Suicide Prevention Education during admission and/or prior to discharge.  Physician notified.  [name] 02/23/2014, 11:47 AM

## 2014-02-23 NOTE — Progress Notes (Signed)
D:  Per pt self inventory pt reports sleeping poor, appetite fair, energy level low, ability to pay attention good, rates depression at a 7 out of 10 and hopelessness at a 7 out of 10, anxiety 8 out of 10, endorses SI/AH--contracts for safety, flat/depressed during interaction, pt did not set a goal for today.     A:  Emotional support provided, Encouraged pt to continue with treatment plan and attend all group activities, q15 min checks maintained for safety.  R:  Pt is isolative in room, sleeping a lot today and not going to groups.
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
## 2014-02-23 NOTE — [facility] Suicide Risk Assessment (Signed)
Suicide Risk Assessment  Admission Assessment     Nursing information obtained from:    Demographic factors:    Current Mental Status:    Loss Factors:    Historical Factors:    Risk Reduction Factors:    Total Time spent with patient: 45 minutes  CLINICAL FACTORS:   Depression:   Severe  Psychiatric Specialty Exam:     Blood pressure 128/79, pulse 72, temperature 97.9 F (36.6 C), temperature source Oral, resp. rate 16, height 6' (1.829 m), weight 131.997 kg (291 lb).Body mass index is 39.46 kg/(m^2).  General Appearance: Fairly Groomed  Patent attorney::  Fair  Speech:  Clear and Coherent  Volume:  Decreased  Mood:  Anxious and Depressed  Affect:  Depressed and worried  Thought Process:  Coherent and Goal Directed  Orientation:  Full (Time, Place, and Person)  Thought Content:  symptoms worries concerns spc worried about the voices that tell him to hurt himself  Suicidal Thoughts:  No  Homicidal Thoughts:  No  Memory:  Immediate;   Fair Recent;   Fair Remote;   Fair  Judgement:  Fair  Insight:  Present  Psychomotor Activity:  Restlessness  Concentration:  Fair  Recall:  [employer] of Knowledge:NA  Language: Fair  Akathisia:  No  Handed:    AIMS (if indicated):     Assets:  Desire for Improvement Housing  Sleep:  Number of Hours: 6.25   Musculoskeletal: Strength & Muscle Tone: within normal limits Gait & Station: normal Patient leans: N/A  COGNITIVE FEATURES THAT CONTRIBUTE TO RISK:  Closed-mindedness Polarized thinking Thought constriction (tunnel vision)    SUICIDE RISK:   Moderate:  PLAN OF CARE: Supportive approach/coping skills                               Reassess and optimize treatment with psychotropics                               Resume Seroquel ( states it help with the sleep and the voices. It was D/C                                    he states by a prescriber at [facility]  I certify that inpatient services furnished can reasonably be expected  to improve the patient's condition.  Shalynn Jorstad A 02/23/2014, 3:41 PM

## 2014-02-24 DIAGNOSIS — F323 Major depressive disorder, single episode, severe with psychotic features: Secondary | ICD-10-CM

## 2014-02-24 NOTE — Progress Notes (Signed)
[facility] MD Progress Note  02/24/2014 4:35 PM [name] [name]  MRN:  [id number] Subjective:  Junior states he slept better last night. He still endorses hearing   voices that make negative comments. He states that he is basically homeless. He has been out of town, staying here and there. He is trying to work on some more permanent accommodations. He does not have an on going source of income. 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  HENT: Negative.   Eyes: Negative.   Respiratory: Negative.   Cardiovascular: Negative.   Gastrointestinal: Negative.   Genitourinary: Negative.   Musculoskeletal: Negative.   Skin: Negative.   Neurological: Positive for weakness.  Endo/Heme/Allergies: Negative.   Psychiatric/Behavioral: Positive for depression and hallucinations. The patient is nervous/anxious.     Blood pressure 94/64, pulse 94, temperature 97.7 F (36.5 C), temperature source Oral, resp. rate 20, height 6' (1.829 m), weight 131.997 kg (291 lb).Body mass index is 39.46 kg/(m^2).  General Appearance: Disheveled  Eye Solicitor::  Fair  Speech:  Clear and Coherent  Volume:  Decreased  Mood:  Anxious and Depressed  Affect:  Restricted  Thought Process:  Coherent and Goal Directed  Orientation:  Full (Time, Place, and Person)  Thought Content:  events symptoms worries and concerns  Suicidal Thoughts:  No  Homicidal Thoughts:  No  Memory:  Immediate;   Fair Recent;   Fair Remote;   Fair  Judgement:  Fair  Insight:  Present and Shallow  Psychomotor Activity:  Restlessness  Concentration:  Fair  Recall:  [employer] of Knowledge:NA  Language: Fair   Akathisia:  No  Handed:    AIMS (if indicated):     Assets:  Desire for Improvement  Sleep:  Number of Hours: 6.75   Musculoskeletal: Strength & Muscle Tone: within normal limits Gait & Station: normal Patient leans: N/A  Current Medications: Current Facility-Administered Medications  Medication Dose Route Frequency Provider Last Rate Last Dose  . acetaminophen (TYLENOL) tablet 650 mg  650 mg Oral Q4H PRN Shuvon Rankin, NP      . alum & mag hydroxide-simeth (MAALOX/MYLANTA) 200-200-20 MG/5ML suspension 30 mL  30 mL Oral Q4H PRN Shuvon Rankin, NP      . DULoxetine (CYMBALTA) DR capsule 60 mg  60 mg Oral Daily Shuvon Rankin, NP   60 mg at 02/24/14 0835  . gabapentin (NEURONTIN) capsule 100 mg  100 mg Oral TID Shuvon Rankin, NP   100 mg at 02/24/14 1143  . magnesium hydroxide (MILK OF MAGNESIA) suspension 30 mL  30 mL Oral Daily PRN Shuvon Rankin, NP      . QUEtiapine (SEROQUEL) tablet 100 mg  100 mg Oral QHS Rachael Fee, MD   100 mg at 02/23/14 2137  . traZODone (DESYREL) tablet 100 mg  100 mg Oral QHS Nanine Means, NP   100 mg at 02/23/14 2137    Lab Results: No results found for this or any previous visit (from the past 48 hour(s)).  Physical Findings: AIMS: Facial and Oral Movements Muscles of Facial Expression: None, normal Lips and Perioral Area: None, normal Jaw: None, normal Tongue: None, normal,Extremity Movements Upper (arms, wrists, hands, fingers): None, normal  Lower (legs, knees, ankles, toes): None, normal, Trunk Movements Neck, shoulders, hips: None, normal, Overall Severity Severity of abnormal movements (highest score from questions above): None, normal Incapacitation due to abnormal movements: None, normal Patient's awareness of abnormal movements (rate only patient's report): No Awareness, Dental Status Current problems with teeth and/or dentures?: No Does patient usually wear dentures?: No  CIWA:    COWS:     Treatment Plan Summary: Daily contact with  patient to assess and evaluate symptoms and progress in treatment Medication management  Plan: Supportive approach/coping skills           CBT;mindfulness           Optimize response to psychotropics           Continue Seroquel adjust the dose as we go along to address the hallucinations Medical Decision Making Problem Points:  Review of psycho-social stressors (1) Data Points:  Review of medication regiment & side effects (2) Review of new medications or change in dosage (2)  I certify that inpatient services furnished can reasonably be expected to improve the patient's condition.   Sudie Bandel A 02/24/2014, 4:35 PM

## 2014-02-24 NOTE — Progress Notes (Signed)
Recreation Therapy Notes   Animal-Assisted Activity/Therapy (AAA/T) Program Checklist/Progress Notes Patient Eligibility Criteria Checklist & Daily Group note for Rec Tx Intervention  Date: 09.22.2015 Time: 2:45pm Location: [address]    AAA/T Program Assumption of Risk Form signed by Patient/ or Parent Legal Guardian yes  Patient is free of allergies or sever asthma yes  Patient reports no fear of animals yes  Patient reports no history of cruelty to animals yes   Patient understands his/her participation is voluntary yes  Behavioral Response: Did not attend.   [name] [name], LRT/CTRS        Jearl Klinefelter 02/24/2014 3:29 PM

## 2014-02-24 NOTE — Progress Notes (Signed)
Pt observed sitting in the dayroom watching TV and talking with peers.  Pt reports he is doing much better this evening. He was smiling during the conversation and denied SI/HI/AV.  Pt states he attended most groups today.  He says he came to [facility] to get back on his medications.  Discussed pt's hs meds with him.  Pt is in agreement.  He is hoping to return to the shelter where he was staying prior to his admission.  He is homeless and estranged from his family.  His parents are deceased.  Pt is pleasant/cooperative with Clinical research associate.  Pt makes his needs known to staff.  Support and encouragement offered.  Safety maintained with q15 minute checks.

## 2014-02-24 NOTE — Tx Team (Signed)
Interdisciplinary Treatment Plan Update   Date Reviewed:  02/24/2014  Time Reviewed:  8:25 AM  Progress in Treatment:   Attending groups: Yes Participating in groups: Yes Taking medication as prescribed: Yes  Tolerating medication: Yes Family/Significant other contact made:  No, patient declined collateral contact Patient understands diagnosis: Yes  Discussing patient identified problems/goals with staff: Yes, patient talked about the need to be stabilized on meds Medical problems stabilized or resolved: Yes Denies suicidal/homicidal ideation: Yes Patient has not harmed self or others: Yes  For review of initial/current patient goals, please see plan of care.  Estimated Length of Stay:  3-5 days  Reasons for Continued Hospitalization:  Anxiety Depression Medication stabilization Suicidal ideation  New Problems/Goals identified:    Discharge Plan or Barriers:   Home with outpatient follow up to be determined  Additional Comments:  [name] [name] is a 33 yr old caucasian male who was initially seen at [facility]. He was transferred to [facility] for further evaluation and treatment. "I had not been taking meds for the last 6 mos because I was feeling good." Three days ago, [name] [name] developed SI/AH and generally was not feeling well.  He states that he has had ongoing, intermittent depression and SI for over 4 years. He denied having a suicidal plan. He just hears soft voices from time to time.    Patient and CSW reviewed patient's identified goals and treatment plan.  Patient verbalized understanding and agreed to treatment plan.   Attendees:  Patient:  02/24/2014 8:25 AM   Signature:  Sallyanne Havers, MD 02/24/2014 8:25 AM  Signature: Geoffery Lyons, MD 02/24/2014 8:25 AM  Signature:  02/24/2014 8:25 AM  Signature: 02/24/2014 8:25 AM  Signature:   02/24/2014 8:25 AM  Signature:  Juline Patch, LCSW 02/24/2014 8:25 AM  Signature:  Belenda Cruise Drinkard, LCSW-A 02/24/2014 8:25 AM  Signature:   Leisa Lenz, Care Coordinator [facility] 02/24/2014 8:25 AM  Signature:  Kathi Simpers, RN 02/24/2014 8:25 AM  Signature: Michaelle Birks RN 02/24/2014  8:25 AM  Signature:   Onnie Boer, RN [facility] 02/24/2014  8:25 AM  Signature:   02/24/2014  8:25 AM    Scribe for Treatment Team:   Juline Patch,  02/24/2014 8:25 AM

## 2014-02-24 NOTE — [facility] Group Notes (Signed)
[facility] LCSW Group Therapy  02/24/2014 2:50 PM  Type of Therapy:  Nurse Education  Participation Level:  Did Not Attend - Patient was in bed sleeping.  Summary of Progress/Problems:  [name] 02/24/2014, 2:50 PM

## 2014-02-25 MED ORDER — GABAPENTIN 300 MG PO CAPS
300.0000 mg | ORAL_CAPSULE | Freq: Three times a day (TID) | ORAL | Status: DC
  Administered 2014-02-25 – 2014-03-03 (×17): 300 mg via ORAL
  Filled 2014-02-25 (×20): qty 1

## 2014-02-25 MED ORDER — QUETIAPINE FUMARATE 200 MG PO TABS
200.0000 mg | ORAL_TABLET | Freq: Every day | ORAL | Status: DC
  Administered 2014-02-25 – 2014-02-26 (×2): 200 mg via ORAL
  Filled 2014-02-25 (×4): qty 1

## 2014-02-25 NOTE — Progress Notes (Signed)
D   Pt is appropriate and pleasant    He did not attend all of his groups today but does interact well with peers and staff   He denies hallucinations and presently denies suicidal ideation  He has spent most of the evening playing cards with other pts A   Verbal support given   Medications administered and effectiveness monitored   Q 15 min checks R   Pt safe at present

## 2014-02-25 NOTE — Progress Notes (Signed)
Patient ID: [name] [name], male   DOB: 02/21/1981, 33 y.o.   MRN: [id number] D: Patient in [place] with peers on approach. Pt reports his day has been good. Pt reports his goal was to attend groups and he was able to do so. Pt mood and affect appeared depressed but appropriate.  Pt denies SI/HIVH and pain. Pt endorses auditory hallucination without command. Cooperative with assessment. No acute distressed noted at this time.   A: Met with pt 1:1. Medications administered as prescribed. Writer encouraged pt to discuss feelings. Pt encouraged to come to staff with any question or concerns.   R: Patient remains safe. He is complaint with medications and denies any adverse reaction. Pt attended evening group.

## 2014-02-25 NOTE — Progress Notes (Signed)
Pt has been in bed most of the day.  He rated all his depression, hopelessness and anxiety a 7 on his self-inventory.  Pt denied any A/V/H this morning but did state,"I heard voices last night laughing and talking" He denied any S/H ideation. He is unsure of his discharge plan

## 2014-02-25 NOTE — Progress Notes (Signed)
[facility] MD Progress Note  02/25/2014 2:45 PM [name] [name]  MRN:  [id number] Subjective:  [name] [name] states he is back not sleeping well. States that the voices are persistent. As of lately they seem to be more than one and they talk to each other. When he can tell what they are saying they are insulting. Point out negative things about him. Also admits to a lot of anxiety. Has not yet figure out where to go from here. States that the voices are taking all his attention Diagnosis:   DSM5: Substance/Addictive Disorders:  Alcohol Related Disorder - Mild (305.00) Depressive Disorders:  Major Depressive Disorder - with Psychotic Features (296.24) Total Time spent with patient: 30 minutes  Axis I: Generalized Anxiety Disorder  ADL's:  Intact  Sleep: Fair  Appetite:  Fair  Psychiatric Specialty Exam: Physical Exam  Review of Systems  Constitutional: Positive for malaise/fatigue.  HENT: Negative.   Eyes: Negative.   Respiratory: Negative.   Cardiovascular: Negative.   Gastrointestinal: Negative.   Genitourinary: Negative.   Musculoskeletal: Negative.   Skin: Negative.   Neurological: Negative.   Endo/Heme/Allergies: Negative.   Psychiatric/Behavioral: Positive for depression and hallucinations. The patient is nervous/anxious and has insomnia.     Blood pressure 132/90, pulse 103, temperature 97.8 F (36.6 C), temperature source Oral, resp. rate 18, height 6' (1.829 m), weight 131.997 kg (291 lb).Body mass index is 39.46 kg/(m^2).  General Appearance: Disheveled  Eye Contact::  Minimal  Speech:  Clear and Coherent, Slow and not spontaneous  Volume:  Decreased  Mood:  Anxious and Depressed  Affect:  Restricted  Thought Process:  Coherent and Goal Directed  Orientation:  Full (Time, Place, and Person)  Thought Content:  symptoms, worries, concerns about the voices  Suicidal Thoughts:  No  Homicidal Thoughts:  No  Memory:  Immediate;   Fair Recent;   Fair Remote;   Fair   Judgement:  Fair  Insight:  Present and Shallow  Psychomotor Activity:  Restlessness  Concentration:  Fair  Recall:  [employer] of Knowledge:NA  Language: Fair  Akathisia:  No  Handed:    AIMS (if indicated):     Assets:  Desire for Improvement  Sleep:  Number of Hours: 6.75   Musculoskeletal: Strength & Muscle Tone: within normal limits Gait & Station: normal Patient leans: N/A  Current Medications: Current Facility-Administered Medications  Medication Dose Route Frequency Provider Last Rate Last Dose  . acetaminophen (TYLENOL) tablet 650 mg  650 mg Oral Q4H PRN Shuvon Rankin, NP      . alum & mag hydroxide-simeth (MAALOX/MYLANTA) 200-200-20 MG/5ML suspension 30 mL  30 mL Oral Q4H PRN Shuvon Rankin, NP      . DULoxetine (CYMBALTA) DR capsule 60 mg  60 mg Oral Daily Shuvon Rankin, NP   60 mg at 02/25/14 0813  . gabapentin (NEURONTIN) capsule 300 mg  300 mg Oral TID Rachael Fee, MD      . magnesium hydroxide (MILK OF MAGNESIA) suspension 30 mL  30 mL Oral Daily PRN Shuvon Rankin, NP      . QUEtiapine (SEROQUEL) tablet 200 mg  200 mg Oral QHS Rachael Fee, MD      . traZODone (DESYREL) tablet 100 mg  100 mg Oral QHS Nanine Means, NP   100 mg at 02/24/14 2246    Lab Results: No results found for this or any previous visit (from the past 48 hour(s)).  Physical Findings: AIMS: Facial and Oral Movements Muscles of Facial  Expression: None, normal Lips and Perioral Area: None, normal Jaw: None, normal Tongue: None, normal,Extremity Movements Upper (arms, wrists, hands, fingers): None, normal Lower (legs, knees, ankles, toes): None, normal, Trunk Movements Neck, shoulders, hips: None, normal, Overall Severity Severity of abnormal movements (highest score from questions above): None, normal Incapacitation due to abnormal movements: None, normal Patient's awareness of abnormal movements (rate only patient's report): No Awareness, Dental Status Current problems with teeth and/or  dentures?: No Does patient usually wear dentures?: No  CIWA:    COWS:     Treatment Plan Summary: Daily contact with patient to assess and evaluate symptoms and progress in treatment Medication management  Plan: Supportive approach/coping skills/relapse prevention           Increase the Seroquel to 200 mg HS           Increase the Neurontin to 300 mg (states he was taking this dose before and it was           helpful)  Medical Decision Making Problem Points:  Review of psycho-social stressors (1) Data Points:  Review of medication regiment & side effects (2) Review of new medications or change in dosage (2)  I certify that inpatient services furnished can reasonably be expected to improve the patient's condition.   Masako Overall A 02/25/2014, 2:45 PM

## 2014-02-25 NOTE — [facility] Group Notes (Signed)
[facility] LCSW Aftercare Discharge Planning Group Note   02/25/2014 9:45 AM    Participation Quality:  Appropraite  Mood/Affect:  Appropriate  Depression Rating:  7  Anxiety Rating:  7  Thoughts of Suicide:  Yes  Will you contract for safety?  Yes  Current AVH:  No  Plan for Discharge/Comments:  Patient attended discharge planning group and actively participated in group.  Patient advised he continues to have SI but not as frequently.  He will follow up with [facility] at discharge and hopes to get into a shelter. CSW provided all participants with daily workbook.   Transportation Means: Patient has transportation.   Supports:  Patient has a support system.   [name] [name], [name] [name]

## 2014-02-25 NOTE — [facility] Group Notes (Signed)
[facility] LCSW Group Therapy  Emotional Regulation 1:15 - 2: 30 PM        02/25/2014     Type of Therapy:  Group Therapy  Participation Level:  Appropriate  Participation Quality:  Appropriate  Affect:  Appropriate  Cognitive:  Attentive Appropriate  Insight:  Developing/Improving Engaged  Engagement in Therapy:  Developing/Improving Engaged  Modes of Intervention:  Discussion Exploration Problem-Solving Supportive  Summary of Progress/Problems:  Group topic was emotional regulations.  Patient participated in the discussion and was able to identify an emotion that needed to regulated. Patient talked about needing to control anger.  Patient was able to identify approprite coping skills including taking time to thing before acting.  [name] [name] 02/25/2014

## 2014-02-26 ENCOUNTER — Other Ambulatory Visit: Payer: Self-pay

## 2014-02-26 DIAGNOSIS — F411 Generalized anxiety disorder: Secondary | ICD-10-CM

## 2014-02-26 DIAGNOSIS — R45851 Suicidal ideations: Secondary | ICD-10-CM

## 2014-02-26 MED ORDER — BUPROPION HCL ER (XL) 150 MG PO TB24
150.0000 mg | ORAL_TABLET | Freq: Every day | ORAL | Status: DC
  Administered 2014-02-26 – 2014-03-03 (×6): 150 mg via ORAL
  Filled 2014-02-26 (×8): qty 1

## 2014-02-26 NOTE — Progress Notes (Signed)
[facility] MD Progress Note  02/26/2014 1:39 PM [name] [name]  MRN:  [id number] Subjective:  [name] [name] endorses that he is still having a hard time. He did not sleep as well last night and he has continued to hear the voices. He also endorses his depression is not getting any better. He stated that he took Paxil and that it might have worked better for him. He later remembered that he also took Wellbutrin and that that was the medication he was trying to recall as it worked well for him Diagnosis:   DSM5: Substance/Addictive Disorders:  Alcohol Related Disorder - Mild (305.00) Depressive Disorders:  Major Depressive Disorder - Severe (296.23) Total Time spent with patient: 30 minutes  Axis I: Generalized Anxiety Disorder  ADL's:  Intact  Sleep: Fair  Appetite:  Fair  Psychiatric Specialty Exam: Physical Exam  Review of Systems  Constitutional: Positive for malaise/fatigue.  HENT: Negative.   Eyes: Negative.   Respiratory: Negative.   Cardiovascular: Negative.   Gastrointestinal: Negative.   Genitourinary: Negative.   Musculoskeletal: Negative.   Skin: Negative.   Neurological: Negative.   Endo/Heme/Allergies: Negative.   Psychiatric/Behavioral: Positive for depression, suicidal ideas and hallucinations. The patient is nervous/anxious and has insomnia.     Blood pressure 122/84, pulse 100, temperature 97.8 F (36.6 C), temperature source Oral, resp. rate 18, height 6' (1.829 m), weight 131.997 kg (291 lb).Body mass index is 39.46 kg/(m^2).  General Appearance: Fairly Groomed  Patent attorney::  Fair  Speech:  Clear and Coherent, Slow and not spontaneous  Volume:  Decreased  Mood:  Anxious and Depressed  Affect:  Restricted  Thought Process:  Coherent and Goal Directed  Orientation:  Full (Time, Place, and Person)  Thought Content:  symptoms worries concerns  Suicidal Thoughts:  Yes, no plan or intent  Homicidal Thoughts:  No  Memory:  Immediate;   Fair Recent;    Fair Remote;   Fair  Judgement:  Fair  Insight:  Present and Shallow  Psychomotor Activity:  Restlessness  Concentration:  Fair  Recall:  [name] [name] of Knowledge:NA  Language: Fair  Akathisia:  No  Handed:    AIMS (if indicated):     Assets:  Desire for Improvement  Sleep:  Number of Hours: 6.5   Musculoskeletal: Strength & Muscle Tone: within normal limits Gait & Station: normal Patient leans: N/A  Current Medications: Current Facility-Administered Medications  Medication Dose Route Frequency Provider Last Rate Last Dose  . acetaminophen (TYLENOL) tablet 650 mg  650 mg Oral Q4H PRN Shuvon Rankin, NP      . alum & mag hydroxide-simeth (MAALOX/MYLANTA) 200-200-20 MG/5ML suspension 30 mL  30 mL Oral Q4H PRN Shuvon Rankin, NP      . buPROPion (WELLBUTRIN XL) 24 hr tablet 150 mg  150 mg Oral Daily Rachael Fee, MD   150 mg at 02/26/14 1244  . DULoxetine (CYMBALTA) DR capsule 60 mg  60 mg Oral Daily Shuvon Rankin, NP   60 mg at 02/26/14 0814  . gabapentin (NEURONTIN) capsule 300 mg  300 mg Oral TID Rachael Fee, MD   300 mg at 02/26/14 1149  . magnesium hydroxide (MILK OF MAGNESIA) suspension 30 mL  30 mL Oral Daily PRN Shuvon Rankin, NP      . QUEtiapine (SEROQUEL) tablet 200 mg  200 mg Oral QHS Rachael Fee, MD   200 mg at 02/25/14 2219  . traZODone (DESYREL) tablet 100 mg  100 mg Oral QHS Nanine Means, NP  100 mg at 02/25/14 2219    Lab Results: No results found for this or any previous visit (from the past 48 hour(s)).  Physical Findings: AIMS: Facial and Oral Movements Muscles of Facial Expression: None, normal Lips and Perioral Area: None, normal Jaw: None, normal Tongue: None, normal,Extremity Movements Upper (arms, wrists, hands, fingers): None, normal Lower (legs, knees, ankles, toes): None, normal, Trunk Movements Neck, shoulders, hips: None, normal, Overall Severity Severity of abnormal movements (highest score from questions above): None, normal Incapacitation  due to abnormal movements: None, normal Patient's awareness of abnormal movements (rate only patient's report): No Awareness, Dental Status Current problems with teeth and/or dentures?: No Does patient usually wear dentures?: No  CIWA:    COWS:     Treatment Plan Summary: Daily contact with patient to assess and evaluate symptoms and progress in treatment Medication management  Plan: Supportive approach/coping skills           Will increase the Seroquel to 200 mg           Will augment the Cymbalta 60 by adding Wellbutrin Xl 150 mg in AM  Medical Decision Making Problem Points:  Review of psycho-social stressors (1) Data Points:  Review of medication regiment & side effects (2) Review of new medications or change in dosage (2)  I certify that inpatient services furnished can reasonably be expected to improve the patient's condition.   [name] [name] A 02/26/2014, 1:39 PM

## 2014-02-26 NOTE — [facility] Group Notes (Signed)
0900 nursing orientation group   The focus of this group is to educate the patient on the purpose and policies of crisis stabilization and provide a format to answer questions about their admission.  The group details unit policies and expectations of patients while admitted.  Pt did not attend he was in his bed asleep. 

## 2014-02-26 NOTE — [facility] Group Notes (Signed)
[facility] LCSW Group Therapy  [employer]  1:15 - 2:30 PM  02/26/2014 3:23 PM   Type of Therapy:  Group Therapy  Participation Level: Patient in bed - did not attend group.  [name] 02/26/2014  3:23 PM

## 2014-02-26 NOTE — Progress Notes (Signed)
RN Shift note 2300-0700  D: Pt in bed resting with eyes closed. Respirations even and unlabored. Pt appears to be in no signs of distress at this time. A: Q15min checks remains for this pt. R: Pt remains safe at this time.   

## 2014-02-26 NOTE — Progress Notes (Signed)
Patient did attend the evening karaoke group. Pt was engaged, supportive, and participated by singing a song.  

## 2014-02-26 NOTE — Progress Notes (Signed)
Pt has been up for more groups today.  He rated all his depression, hopelessness and anxiety a 7 on his self-inventory. He denied any A/V/H today.  He does have some passive S/I no plan here in hospital and contracts to come to staff before acting on any thoughts and denied any H/I. His goal today "work on my discharge plan" by talking with his case manager, and doctor" he was started on wellbutrin today.

## 2014-02-27 MED ORDER — QUETIAPINE FUMARATE 300 MG PO TABS
300.0000 mg | ORAL_TABLET | Freq: Every day | ORAL | Status: DC
  Administered 2014-02-27 – 2014-03-02 (×4): 300 mg via ORAL
  Filled 2014-02-27 (×6): qty 1

## 2014-02-27 MED ORDER — HYDROXYZINE HCL 25 MG PO TABS
25.0000 mg | ORAL_TABLET | Freq: Four times a day (QID) | ORAL | Status: DC | PRN
  Administered 2014-02-27 – 2014-03-02 (×3): 25 mg via ORAL
  Filled 2014-02-27: qty 1
  Filled 2014-02-27: qty 20
  Filled 2014-02-27 (×2): qty 1

## 2014-02-27 NOTE — Progress Notes (Signed)
.  Adult Psychoeducational Group Note  Date:  02/27/2014 Time:  11:31 AM  Group Topic/Focus:  Relapse Prevention Planning:   The focus of this group is to define relapse and discuss the need for planning to combat relapse.  Participation Level:  Did Not Attend  Dalia Heading 02/27/2014, 11:31 AM

## 2014-02-27 NOTE — Progress Notes (Signed)
[facility] MD Progress Note  02/27/2014 3:02 PM [name] [name]  MRN:  [id number] Subjective:  [name] [name] admits he is  very distraught. Since yesterday afternoon he is upset as he feels he is all alone, he has no one, no support. This got worst this afternoon when in group he heard other patients talk about their D/C plans. States everyone in the group had people they could count on except him. He states that after his mother died the family  went their different ways and their is no communication, no support. 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 He has not been able to get stable employment since then. He states he has gone to people he help while he was working there and has gotten no support increasing his sense of aloneness. States that yesterday and today he has felt like getting drunk and passing out to deal with the way he is feeling Admits to SI "might as well be dead" also admits to the persistence of the voices Diagnosis:   DSM5: Substance/Addictive Disorders:  Alcohol Related Disorder - Moderate (303.90) Depressive Disorders:  Major Depressive Disorder - with Psychotic Features (296.24) Total Time spent with patient: 30 minutes  Axis I: Generalized Anxiety Disorder  ADL's:  Intact  Sleep: Poor  Appetite:  Fair  Psychiatric Specialty Exam: Physical Exam  Review of Systems  Constitutional: Negative.   HENT: Negative.   Eyes: Negative.   Respiratory: Negative.   Cardiovascular: Negative.   Gastrointestinal: Negative.   Genitourinary: Negative.   Musculoskeletal: Negative.   Skin: Negative.   Neurological: Negative.   Endo/Heme/Allergies: Negative.   Psychiatric/Behavioral: Positive for depression, suicidal ideas and substance abuse. The patient is nervous/anxious and has insomnia.     Blood pressure 102/62, pulse 108, temperature 97.7 F (36.5 C), temperature source Oral, resp. rate 20, height 6' (1.829 m), weight 131.997 kg  (291 lb), SpO2 99.00%.Body mass index is 39.46 kg/(m^2).  General Appearance: Fairly Groomed  Patent attorney::  Minimal  Speech:  Clear and Coherent and Slow  Volume:  Decreased  Mood:  Anxious, Depressed and Dysphoric  Affect:  Tearful  Thought Process:  Coherent and Goal Directed  Orientation:  Full (Time, Place, and Person)  Thought Content:  events symptoms worries concerns  Suicidal Thoughts:  Yes.  without intent/plan  Homicidal Thoughts:  No  Memory:  Immediate;   Fair Recent;   Fair Remote;   Fair  Judgement:  Fair  Insight:  Present  Psychomotor Activity:  Restlessness  Concentration:  Fair  Recall:  [employer] of Knowledge:NA  Language: Fair  Akathisia:  No  Handed:    AIMS (if indicated):     Assets:  Desire for Improvement  Sleep:  Number of Hours: 5.5   Musculoskeletal: Strength & Muscle Tone: within normal limits Gait & Station: normal Patient leans: N/A  Current Medications: Current Facility-Administered Medications  Medication Dose Route Frequency Provider Last Rate Last Dose  . acetaminophen (TYLENOL) tablet 650 mg  650 mg Oral Q4H PRN Shuvon Rankin, NP      . alum & mag hydroxide-simeth (MAALOX/MYLANTA) 200-200-20 MG/5ML suspension 30 mL  30 mL Oral Q4H PRN Shuvon Rankin, NP   30 mL at 02/26/14 2319  . buPROPion (WELLBUTRIN XL) 24 hr tablet 150 mg  150 mg Oral Daily Rachael Fee, MD   150 mg at 02/27/14 1191  . DULoxetine (CYMBALTA) DR capsule 60 mg  60 mg Oral Daily Shuvon  Rankin, NP   60 mg at 02/27/14 1610  . gabapentin (NEURONTIN) capsule 300 mg  300 mg Oral TID Rachael Fee, MD   300 mg at 02/27/14 1146  . hydrOXYzine (ATARAX/VISTARIL) tablet 25 mg  25 mg Oral Q6H PRN Rachael Fee, MD      . magnesium hydroxide (MILK OF MAGNESIA) suspension 30 mL  30 mL Oral Daily PRN Shuvon Rankin, NP      . QUEtiapine (SEROQUEL) tablet 300 mg  300 mg Oral QHS Rachael Fee, MD      . traZODone (DESYREL) tablet 100 mg  100 mg Oral QHS Nanine Means, NP   100 mg at  02/26/14 2222    Lab Results: No results found for this or any previous visit (from the past 48 hour(s)).  Physical Findings: AIMS: Facial and Oral Movements Muscles of Facial Expression: None, normal Lips and Perioral Area: None, normal Jaw: None, normal Tongue: None, normal,Extremity Movements Upper (arms, wrists, hands, fingers): None, normal Lower (legs, knees, ankles, toes): None, normal, Trunk Movements Neck, shoulders, hips: None, normal, Overall Severity Severity of abnormal movements (highest score from questions above): None, normal Incapacitation due to abnormal movements: None, normal Patient's awareness of abnormal movements (rate only patient's report): No Awareness, Dental Status Current problems with teeth and/or dentures?: No Does patient usually wear dentures?: No  CIWA:    COWS:     Treatment Plan Summary: Daily contact with patient to assess and evaluate symptoms and progress in treatment Medication management  Plan: Supportive approach/coping skills/relapse prevention           CBT;mindfulness           Increase the Seroquel to 300 mg HS, Use Vistaril 25 mg Q 6 Hrs PRN anxiety           Explore placement options  Medical Decision Making Problem Points:  Review of psycho-social stressors (1) Data Points:  Review of medication regiment & side effects (2) Review of new medications or change in dosage (2)  I certify that inpatient services furnished can reasonably be expected to improve the patient's condition.   [name] [name] A 02/27/2014, 3:02 PM

## 2014-02-27 NOTE — Progress Notes (Signed)
D: Pt presents flat in affect but brightens upon approach. Pt reports that his mood has been "up and down" today. Pt was observed playing cards with the other patients. Pt actively participated within the milieu.  Pt is passively SI with no plan. Pt verbally contracts for safety. A: Writer administered scheduled and prn medications to pt. Continued support and availability as needed was extended to this pt. Staff continue to monitor pt with q4min checks.  R: No adverse drug reactions noted. Pt receptive to treatment. Pt remains safe at this time.

## 2014-02-27 NOTE — Progress Notes (Signed)
Patient ID: [name] [name], male   DOB: [birth year], 33 y.o.   MRN: [id number] D: Patient in dayroom playing cards on approach. Pt reports his day was "awful". Pt reports suicidal thoughts all day and finally told Dr. Sabra Heck "will drink and use drugs if discharge because he has nothing to go home to". Pt mood and affect appeared depressed and flat. Pt verbally contract for safety.  Pt denies HI/AVH and pain. Pt attended evening wrap up group.  Cooperative with assessment. No acute distressed noted at this time.   A: Met with pt 1:1. Medications administered as prescribed. Writer encouraged pt to discuss feelings. Pt encouraged to come to staff with any question or concerns.   R: Patient remains safe. He is complaint with medications and denies any adverse reaction. Continue current POC.

## 2014-02-27 NOTE — [facility] Group Notes (Signed)
[facility] LCSW Group Therapy  Feelings Around Relapse 1:15 -2:30        02/27/2014   Type of Therapy:  Group Therapy  Participation Level:  Appropriate  Participation Quality:  Appropriate  Affect:  Appropriate  Cognitive:  Attentive Appropriate  Insight:  Developing/Improving  Engagement in Therapy: Developing/Improving  Modes of Intervention:  Discussion Exploration Problem-Solving Supportive  Summary of Progress/Problems:  The topic for today was feelings around relapse.    Patient processed feelings toward relapse and was able to relate to peers. He shared relapse for him includes going back into the community with no place to live.  Patient identified coping skills that can be used to prevent a relapse including finding a job and housing.     [name] 02/27/2014  3:40 PM

## 2014-02-27 NOTE — Tx Team (Signed)
Interdisciplinary Treatment Plan Update   Date Reviewed:  02/27/2014  Time Reviewed:  8:49 AM  Progress in Treatment:   Attending groups: Yes Participating in groups: Yes Taking medication as prescribed: Yes  Tolerating medication: Yes Family/Significant other contact made:  No, patient declined collateral contact Patient understands diagnosis: Yes  Discussing patient identified problems/goals with staff: Yes, patient talked about the need to be stabilized on meds Medical problems stabilized or resolved: Yes Denies suicidal/homicidal ideation: Yes Patient has not harmed self or others: Yes  For review of initial/current patient goals, please see plan of care.  Estimated Length of Stay:  3-5 days  Reasons for Continued Hospitalization:  Anxiety Depression Medication stabilization   New Problems/Goals identified:  MD to continue making adjustments to medications.  Discharge Plan or Barriers:   Home with outpatient follow up with [facility]  Additional Comments:  [name] [name] is a 33 yr old caucasian male who was initially seen at [facility]. He was transferred to [facility] for further evaluation and treatment. "I had not been taking meds for the last 6 mos because I was feeling good." Three days ago, [name] [name] developed SI/AH and generally was not feeling well.  He states that he has had ongoing, intermittent depression and SI for over 4 years. He denied having a suicidal plan. He just hears soft voices from time to time.    Patient and CSW reviewed patient's identified goals and treatment plan.  Patient verbalized understanding and agreed to treatment plan.   Attendees:  Patient:  02/27/2014 8:49 AM   Signature:  02/27/2014 8:49 AM  Signature: Geoffery Lyons, MD 02/27/2014 8:49 AM  Signature: Serena Colonel, NP 02/27/2014 8:49 AM  Signature: 02/27/2014 8:49 AM  Signature:   02/27/2014 8:49 AM  Signature:  Juline Patch, LCSW 02/27/2014 8:49 AM  Signature:  Belenda Cruise Drinkard, LCSW-A  02/27/2014 8:49 AM  Signature:  Tomasita Morrow, Care Coordinator [facility] 02/27/2014 8:49 AM  Signature:  Genelle Gather, RN 02/27/2014 8:49 AM  Signature: Leighton Parody, RN 02/27/2014  8:49 AM  Signature:   Onnie Boer, RN [facility] 02/27/2014  8:49 AM  Signature:   02/27/2014  8:49 AM    Scribe for Treatment Team:   Juline Patch,  02/27/2014 8:49 AM

## 2014-02-27 NOTE — [facility] Group Notes (Signed)
Adult Psychoeducational Group Note  Date:  02/27/2014 Time:  10:03 PM  Group Topic/Focus:  Wrap-Up Group:   The focus of this group is to help patients review their daily goal of treatment and discuss progress on daily workbooks.  Participation Level:  Active  Participation Quality:  Appropriate  Affect:  Appropriate  Cognitive:  Appropriate  Insight: Appropriate  Engagement in Group:  Engaged  Modes of Intervention:  Discussion  Additional Comments:  Joie stated his day was good and that he will be here another week.  He expressed that he has no discharge plans yet.  Caroll Rancher A 02/27/2014, 10:03 PM

## 2014-02-27 NOTE — Progress Notes (Signed)
D: Patient denies HI and A/V hallucinations; patient reports sleep is fair; reports appetite is fair; reports energy level is normal ; reports ability to pay attention is poor; rates depression as 7/10; rates hopelessness 7/10; rates anxiety as 7/10; patient is passive SI but contracts  A: Monitored q 15 minutes; patient encouraged to attend groups; patient educated about medications; patient given medications per physician orders; patient encouraged to express feelings and/or concerns  R: Patient is flat, sad, and depressed; patient engages with staff and peers minimally; patient is cooperative;  patient was able to set goal to talk with staff 1:1 when having feelings of SI; patient is taking medications as prescribed and tolerating medications; patient is attending all groups

## 2014-02-27 NOTE — [facility] Group Notes (Signed)
[facility] LCSW Aftercare Discharge Planning Group Note   02/27/2014 10:27 AM  Participation Quality:  Patient did not attend group - patient sleeping.   Sabastion Hrdlicka, [name]

## 2014-02-28 NOTE — Progress Notes (Signed)
Attended group 

## 2014-02-28 NOTE — [facility] Group Notes (Signed)
[facility] Group Notes:  (Nursing/MHT/Case Management/Adjunct)  Date:  02/28/2014  Time:  2:15 PM  Type of Therapy:  Psychoeducational Skills  Participation Level:  Active  Participation Quality:  Appropriate  Affect:  Appropriate  Cognitive:  Appropriate  Insight:  Appropriate  Engagement in Group:  Engaged  Modes of Intervention:  Discussion  Summary of Progress/Problems: Pt did attend self inventory group, pt reported that he was negative SI/HI, no AH/VH noted. Pt rated his depression as a 7, and his helplessness/hopelessness as a 7.     Pt reported no issues or concerns.   Jacquelyne Balint Shanta 02/28/2014, 2:15 PM

## 2014-02-28 NOTE — [facility] Group Notes (Signed)
[facility] Group Notes:  (Nursing/MHT/Case Management/Adjunct)  Date:  02/28/2014  Time:  2:39 PM  Type of Therapy:  Psychoeducational Skills  Participation Level:  Active  Participation Quality:  Appropriate  Affect:  Appropriate  Cognitive:  Appropriate  Insight:  Appropriate  Engagement in Group:  Engaged  Modes of Intervention:  Discussion  Summary of Progress/Problems: Pt did attend healthy coping skills group.  Jacquelyne Balint Shanta 02/28/2014, 2:39 PM

## 2014-02-28 NOTE — Progress Notes (Signed)
Patient ID: [name] [name], male   DOB: Sep 11, 1980, 33 y.o.   MRN: [id number]   D: Pt has been flat and depressed on the unit today, pt did attend all groups and engaged in treatment. Pt reported that he felt better, however remained very depressed and hopeless. Pt reported being negative SI/HI, no AH/VH noted. A: 15 min checks continued for patient safety. R: Pt safety maintained.

## 2014-02-28 NOTE — Progress Notes (Signed)
[facility] MD Progress Note  02/28/2014 5:18 PM [name] [name]  MRN:  [id number] Subjective:  States he was able to sleep last night. He is not experiencing the voices as much as he was before. States so far today it has not been as hard as yesterday. Still upset with the fact he is "all alone" but states when he feels like this he feels he can make it all right but then has days like yesterday and he just brakes down Diagnosis:   DSM5: Substance/Addictive Disorders:  Alcohol Related Disorder - Mild (305.00) Depressive Disorders:  Major Depressive Disorder - with Psychotic Features (296.24) Total Time spent with patient: 30 minutes  Axis I: Generalized Anxiety Disorder  ADL's:  Intact  Sleep: Fair  Appetite:  Fair Psychiatric Specialty Exam: Physical Exam  Review of Systems  Constitutional: Negative.   HENT: Negative.   Eyes: Negative.   Respiratory: Negative.   Cardiovascular: Negative.   Gastrointestinal: Negative.   Genitourinary: Negative.   Skin: Negative.   Neurological: Negative.   Endo/Heme/Allergies: Negative.   Psychiatric/Behavioral: Positive for depression and substance abuse. The patient is nervous/anxious.     Blood pressure 96/62, pulse 107, temperature 97.5 F (36.4 C), temperature source Oral, resp. rate 18, height 6' (1.829 m), weight 131.997 kg (291 lb), SpO2 99.00%.Body mass index is 39.46 kg/(m^2).  General Appearance: Fairly Groomed  Patent attorney::  Fair  Speech:  Clear and Coherent, Slow and not spontaneous  Volume:  Decreased  Mood:  Depressed  Affect:  Restricted and a little more range  Thought Process:  Coherent and Goal Directed  Orientation:  Full (Time, Place, and Person)  Thought Content:  symtpoms worries concerns  Suicidal Thoughts:  No  Homicidal Thoughts:  No  Memory:  Immediate;   Fair Recent;   Fair Remote;   Fair  Judgement:  Fair  Insight:  Present  Psychomotor Activity:  Restlessness  Concentration:  Fair  Recall:  [employer]  of Knowledge:NA  Language: Fair  Akathisia:  No  Handed:    AIMS (if indicated):     Assets:  Desire for Improvement  Sleep:  Number of Hours: 6.75   Musculoskeletal: Strength & Muscle Tone: within normal limits Gait & Station: normal Patient leans: N/A  Current Medications: Current Facility-Administered Medications  Medication Dose Route Frequency Provider Last Rate Last Dose  . acetaminophen (TYLENOL) tablet 650 mg  650 mg Oral Q4H PRN Shuvon Rankin, NP      . alum & mag hydroxide-simeth (MAALOX/MYLANTA) 200-200-20 MG/5ML suspension 30 mL  30 mL Oral Q4H PRN Shuvon Rankin, NP   30 mL at 02/26/14 2319  . buPROPion (WELLBUTRIN XL) 24 hr tablet 150 mg  150 mg Oral Daily Rachael Fee, MD   150 mg at 02/28/14 0818  . DULoxetine (CYMBALTA) DR capsule 60 mg  60 mg Oral Daily Shuvon Rankin, NP   60 mg at 02/28/14 0818  . gabapentin (NEURONTIN) capsule 300 mg  300 mg Oral TID Rachael Fee, MD   300 mg at 02/28/14 1643  . hydrOXYzine (ATARAX/VISTARIL) tablet 25 mg  25 mg Oral Q6H PRN Rachael Fee, MD   25 mg at 02/27/14 1705  . magnesium hydroxide (MILK OF MAGNESIA) suspension 30 mL  30 mL Oral Daily PRN Shuvon Rankin, NP      . QUEtiapine (SEROQUEL) tablet 300 mg  300 mg Oral QHS Rachael Fee, MD   300 mg at 02/27/14 2220  . traZODone (DESYREL) tablet 100  mg  100 mg Oral QHS Nanine Means, NP   100 mg at 02/27/14 2220    Lab Results: No results found for this or any previous visit (from the past 48 hour(s)).  Physical Findings: AIMS: Facial and Oral Movements Muscles of Facial Expression: None, normal Lips and Perioral Area: None, normal Jaw: None, normal Tongue: None, normal,Extremity Movements Upper (arms, wrists, hands, fingers): None, normal Lower (legs, knees, ankles, toes): None, normal, Trunk Movements Neck, shoulders, hips: None, normal, Overall Severity Severity of abnormal movements (highest score from questions above): None, normal Incapacitation due to abnormal  movements: None, normal Patient's awareness of abnormal movements (rate only patient's report): No Awareness, Dental Status Current problems with teeth and/or dentures?: No Does patient usually wear dentures?: No  CIWA:    COWS:     Treatment Plan Summary: Daily contact with patient to assess and evaluate symptoms and progress in treatment Medication management  Plan: Supportive approach/coping skills/relapse prevention           Continue to optimize response to psychotropics           CBT;mindfulness  Medical Decision Making Problem Points:  Review of psycho-social stressors (1) Data Points:  Review of medication regiment & side effects (2)  I certify that inpatient services furnished can reasonably be expected to improve the patient's condition.   Breya Cass A 02/28/2014, 5:18 PM

## 2014-02-28 NOTE — [facility] Group Notes (Signed)
[facility] Group Notes: (Clinical Social Work)   02/28/2014      Type of Therapy:  Group Therapy   Participation Level:  Did Not Attend - was invited, but declined attendance   Ambrose Mantle, LCSW 02/28/2014, 12:02 PM

## 2014-02-28 NOTE — Progress Notes (Signed)
Pt has been up and visible in milieu. Interacting with peers and staff appropriately. He attended AA this evening. Spoke with patient 1:1 who denies AVH at present. He does endorse passive SI but contracts for safety. Given support and encouragement. Medicated per orders without difficulty. Pt remains safe on unit. [name] [name]

## 2014-03-01 MED ORDER — TRAZODONE HCL 150 MG PO TABS
150.0000 mg | ORAL_TABLET | Freq: Every day | ORAL | Status: DC
  Administered 2014-03-01 – 2014-03-02 (×2): 150 mg via ORAL
  Filled 2014-03-01 (×3): qty 1

## 2014-03-01 NOTE — Progress Notes (Signed)
Patient did not attend the evening speaker AA meeting.  

## 2014-03-01 NOTE — Progress Notes (Signed)
D. Patient has been up, has been visible in milieu, limited interaction or participation in group activities. Pt spoke about how he feels medications are beneficial to him, did endorse some anxiety this evening and spoke that he is hopeful to be discharged sometime this week. Pt has received medications today without incident. A. Support and encouragement provided. R. Safety maintained, will continue to monitor.

## 2014-03-01 NOTE — Progress Notes (Signed)
[facility] MD Progress Note  03/01/2014 4:34 PM [name] [name]  MRN:  [id number] Subjective:  States that overall the voices are better. He is having [name] hard time falling asleep. Feels the medications are working well. Still not sure where he is going from here. Hopeful that the SW will help him with some contacts. He is still dealing with the fact he is ''all alone." Diagnosis:   DSM5: Substance/Addictive Disorders:  Alcohol Related Disorder - Moderate (303.90) Depressive Disorders:  Major Depressive Disorder - with Psychotic Features (296.24) Total Time spent with patient: 30 minutes  Axis I: Generalized Anxiety Disorder  ADL's:  Intact  Sleep: difficult time falling asleep  Appetite:  Fair Psychiatric Specialty Exam: Physical Exam  Review of Systems  Constitutional: Negative.   HENT: Negative.   Eyes: Negative.   Respiratory: Negative.   Cardiovascular: Negative.   Gastrointestinal: Negative.   Genitourinary: Negative.   Musculoskeletal: Negative.   Skin: Negative.   Neurological: Negative.   Endo/Heme/Allergies: Negative.   Psychiatric/Behavioral: Positive for depression and substance abuse. The patient is nervous/anxious and has insomnia.     Blood pressure 101/60, pulse 102, temperature 97.4 F (36.3 C), temperature source Oral, resp. rate 18, height 6' (1.829 m), weight 131.997 kg (291 lb), SpO2 99.00%.Body mass index is 39.46 kg/(m^2).  General Appearance: Fairly Groomed  Patent attorney::  Fair  Speech:  Clear and Coherent  Volume:  Normal  Mood:  Anxious and worried  Affect:  worried  Thought Process:  Coherent and Goal Directed  Orientation:  Full (Time, Place, and Person)  Thought Content:  symptoms worries concerns  Suicidal Thoughts:  No  Homicidal Thoughts:  No  Memory:  Immediate;   Fair Recent;   Fair Remote;   Fair  Judgement:  Fair  Insight:  Present  Psychomotor Activity:  Restlessness  Concentration:  Fair  Recall:  [employer] of Knowledge:NA   Language: Fair  Akathisia:  No  Handed:    AIMS (if indicated):     Assets:  Desire for Improvement  Sleep:  Number of Hours: 6.75   Musculoskeletal: Strength & Muscle Tone: within normal limits Gait & Station: normal Patient leans: N/[name]  Current Medications: Current Facility-Administered Medications  Medication Dose Route Frequency Provider Last Rate Last Dose  . acetaminophen (TYLENOL) tablet 650 mg  650 mg Oral Q4H PRN Shuvon Rankin, NP      . alum & mag hydroxide-simeth (MAALOX/MYLANTA) 200-200-20 MG/5ML suspension 30 mL  30 mL Oral Q4H PRN Shuvon Rankin, NP   30 mL at 02/26/14 2319  . buPROPion (WELLBUTRIN XL) 24 hr tablet 150 mg  150 mg Oral Daily Rachael Fee, MD   150 mg at 03/01/14 0844  . DULoxetine (CYMBALTA) DR capsule 60 mg  60 mg Oral Daily Shuvon Rankin, NP   60 mg at 03/01/14 0844  . gabapentin (NEURONTIN) capsule 300 mg  300 mg Oral TID Rachael Fee, MD   300 mg at 03/01/14 1111  . hydrOXYzine (ATARAX/VISTARIL) tablet 25 mg  25 mg Oral Q6H PRN Rachael Fee, MD   25 mg at 03/01/14 0844  . magnesium hydroxide (MILK OF MAGNESIA) suspension 30 mL  30 mL Oral Daily PRN Shuvon Rankin, NP      . QUEtiapine (SEROQUEL) tablet 300 mg  300 mg Oral QHS Rachael Fee, MD   300 mg at 02/28/14 2206  . traZODone (DESYREL) tablet 150 mg  150 mg Oral QHS Rachael Fee, MD  Lab Results: No results found for this or any previous visit (from the past 48 hour(s)).  Physical Findings: AIMS: Facial and Oral Movements Muscles of Facial Expression: None, normal Lips and Perioral Area: None, normal Jaw: None, normal Tongue: None, normal,Extremity Movements Upper (arms, wrists, hands, fingers): None, normal Lower (legs, knees, ankles, toes): None, normal, Trunk Movements Neck, shoulders, hips: None, normal, Overall Severity Severity of abnormal movements (highest score from questions above): None, normal Incapacitation due to abnormal movements: None, normal Patient's awareness  of abnormal movements (rate only patient's report): No Awareness, Dental Status Current problems with teeth and/or dentures?: No Does patient usually wear dentures?: No  CIWA:    COWS:     Treatment Plan Summary: Daily contact with patient to assess and evaluate symptoms and progress in treatment Medication management  Plan: Supportive approach/coping skills/relapse prevention           Will increase the Trazodone to 150 mg HS            Explore placement options Medical Decision Making Problem Points:  Review of psycho-social stressors (1) Data Points:  Review of medication regiment & side effects (2) Review of new medications or change in dosage (2)  I certify that inpatient services furnished can reasonably be expected to improve the patient's condition.   [name] [name] 03/01/2014, 4:34 PM

## 2014-03-01 NOTE — [facility] Group Notes (Signed)
[facility] Group Notes: (Clinical Social Work)   03/01/2014      Type of Therapy:  Group Therapy   Participation Level:  Did Not Attend - declined and stayed in his room   Ambrose Mantle, LCSW 03/01/2014, 12:25 PM

## 2014-03-01 NOTE — [facility] Group Notes (Signed)
[facility] Group Notes:  (Nursing/MHT/Case Management/Adjunct)  Date:  03/01/2014  Time:  4:19 PM  Type of Therapy:  Psychoeducational Skills- Pt Self Inventory Group.   Participation Level:  Did Not Attend   Buford Dresser 03/01/2014, 4:19 PM

## 2014-03-01 NOTE — [facility] Group Notes (Signed)
[facility] Group Notes:  (Nursing/MHT/Case Management/Adjunct)  Date:  03/01/2014  Time:  4:23 PM  Type of Therapy:  Psychoeducational Skills- Healthy Support Systems.   Participation Level:  Did Not Attend   [name] [name] 03/01/2014, 4:23 PM

## 2014-03-02 NOTE — Progress Notes (Signed)
Assumed care of patient at 2300. Patient has rested without difficulty. No complaints voiced. No acute distress observed. Patient remains safe with level III obs in place. Lawrence Marseilles

## 2014-03-02 NOTE — [facility] Group Notes (Signed)
[facility] LCSW Aftercare Discharge Planning Group Note   03/02/2014 12:41 PM    Participation Quality:  Appropraite  Mood/Affect:  Appropriate  Depression Rating:  10  Anxiety Rating:  10  Thoughts of Suicide: Yes   Will you contract for safety? Yes  Current AVH:  No  Plan for Discharge/Comments:  Patient attended discharge planning group and actively participated in group.  He advised of not doing well today.  Patient shared he continues to be depression due not having a place to live or money for medications.  He was informed we will assist him with a two week supply of medications at discharge.  Patient to be referred to [facility] for outpatient services.  CSW provided all participants with daily workbook.   Transportation Means: Patient uses public transportation.   Supports:  Patient has no support system.   Randie Bloodgood, [name]

## 2014-03-02 NOTE — [facility] Group Notes (Signed)
[facility] LCSW Aftercare Discharge Planning Group Note   03/02/2014  .4:07 PM  Participation Quality:  Patient did not attend group - patient in bed.    [name] 03/02/2014  4:07 PM

## 2014-03-02 NOTE — Progress Notes (Signed)
D: Pt presents with flat affect and depressed mood. Pt reports passive SI. Pt verbally contracts for safety, not to harm self. Pt rates depression 5/10. Pt rates anxiety 10/10. Per pt, his depression is worsening since admission.  Pt discharge plan [facility] once [facility] is discharged. Pt has minimal interaction on the milieu. Pt compliant with attending groups and taking med meds.  A: Medications administered as ordered per MD. Verbal support given. Pt encouraged to attend groups. Pt encouraged to report worsening symptoms of depression and SI. 15 minute checks performed for safety.  R: Pt safety maintained at this time. Pt receptive to treatment.

## 2014-03-02 NOTE — Progress Notes (Signed)
[facility] MD Progress Note  03/02/2014 4:03 PM [name] [name]  MRN:  [id number] Subjective:  [name] states that he is having thoughts that he would rather be dead and he would not mind if he was to wake up dead. States he will not kill himself. He would like for his depression to be better. The voices are getting better so he has some hope he did not have before. States that the feeling that he is all alone triggers the depression and the death wishes Diagnosis:   DSM5: Substance/Addictive Disorders:  Alcohol Related Disorder - Moderate (303.90) Depressive Disorders:  Major Depressive Disorder - with Psychotic Features (296.24) Total Time spent with patient: 30 minutes  Axis I: Generalized Anxiety Disorder  ADL's:  Intact  Sleep: Fair  Appetite:  Fair  Psychiatric Specialty Exam: Physical Exam  Review of Systems  Constitutional: Negative.   HENT: Negative.   Eyes: Negative.   Respiratory: Negative.   Cardiovascular: Negative.   Gastrointestinal: Negative.   Genitourinary: Negative.   Musculoskeletal: Negative.   Skin: Negative.   Neurological: Negative.   Endo/Heme/Allergies: Negative.   Psychiatric/Behavioral: Positive for depression and substance abuse. The patient is nervous/anxious.     Blood pressure 107/61, pulse 98, temperature 97.7 F (36.5 C), temperature source Oral, resp. rate 20, height 6' (1.829 m), weight 131.997 kg (291 lb), SpO2 99.00%.Body mass index is 39.46 kg/(m^2).  General Appearance: Fairly Groomed  Patent attorney::  Fair  Speech:  Clear and Coherent  Volume:  Normal  Mood:  Anxious, Depressed and worried  Affect:  sad, anxious, worried  Thought Process:  Coherent and Goal Directed  Orientation:  Full (Time, Place, and Person)  Thought Content:  symptoms worries concerns  Suicidal Thoughts:  No  Homicidal Thoughts:  No  Memory:  Immediate;   Fair Recent;   Fair Remote;   Fair  Judgement:  Fair  Insight:  Present and Shallow  Psychomotor  Activity:  Restlessness  Concentration:  Fair  Recall:  [employer] of Knowledge:NA  Language: Fair  Akathisia:  No  Handed:    AIMS (if indicated):     Assets:  Desire for Improvement  Sleep:  Number of Hours: 6.75   Musculoskeletal: Strength & Muscle Tone: within normal limits Gait & Station: normal Patient leans: N/A  Current Medications: Current Facility-Administered Medications  Medication Dose Route Frequency Provider Last Rate Last Dose  . acetaminophen (TYLENOL) tablet 650 mg  650 mg Oral Q4H PRN Shuvon Rankin, NP      . alum & mag hydroxide-simeth (MAALOX/MYLANTA) 200-200-20 MG/5ML suspension 30 mL  30 mL Oral Q4H PRN Shuvon Rankin, NP   30 mL at 02/26/14 2319  . buPROPion (WELLBUTRIN XL) 24 hr tablet 150 mg  150 mg Oral Daily Rachael Fee, MD   150 mg at 03/02/14 0802  . DULoxetine (CYMBALTA) DR capsule 60 mg  60 mg Oral Daily Shuvon Rankin, NP   60 mg at 03/02/14 0802  . gabapentin (NEURONTIN) capsule 300 mg  300 mg Oral TID Rachael Fee, MD   300 mg at 03/02/14 1203  . hydrOXYzine (ATARAX/VISTARIL) tablet 25 mg  25 mg Oral Q6H PRN Rachael Fee, MD   25 mg at 03/01/14 0844  . magnesium hydroxide (MILK OF MAGNESIA) suspension 30 mL  30 mL Oral Daily PRN Shuvon Rankin, NP      . QUEtiapine (SEROQUEL) tablet 300 mg  300 mg Oral QHS Rachael Fee, MD   300 mg at 03/01/14  2142  . traZODone (DESYREL) tablet 150 mg  150 mg Oral QHS Rachael Fee, MD   150 mg at 03/01/14 2142    Lab Results: No results found for this or any previous visit (from the past 48 hour(s)).  Physical Findings: AIMS: Facial and Oral Movements Muscles of Facial Expression: None, normal Lips and Perioral Area: None, normal Jaw: None, normal Tongue: None, normal,Extremity Movements Upper (arms, wrists, hands, fingers): None, normal Lower (legs, knees, ankles, toes): None, normal, Trunk Movements Neck, shoulders, hips: None, normal, Overall Severity Severity of abnormal movements (highest score from  questions above): None, normal Incapacitation due to abnormal movements: None, normal Patient's awareness of abnormal movements (rate only patient's report): No Awareness, Dental Status Current problems with teeth and/or dentures?: No Does patient usually wear dentures?: No  CIWA:    COWS:     Treatment Plan Summary: Daily contact with patient to assess and evaluate symptoms and progress in treatment Medication management  Plan: Supportive approach/copign skills/relapse prevention           Continue actual medication management           Explore placement options  Medical Decision Making Problem Points:  Review of psycho-social stressors (1) Data Points:  Review of medication regiment & side effects (2)  I certify that inpatient services furnished can reasonably be expected to improve the patient's condition.   Danyiel Crespin A 03/02/2014, 4:03 PM

## 2014-03-02 NOTE — Progress Notes (Signed)
Adult Psychoeducational Group Note  Date:  03/02/2014 Time:  8:15pm Group Topic/Focus:  Wrap-Up Group:   The focus of this group is to help patients review their daily goal of treatment and discuss progress on daily workbooks.  Participation Level:  Active  Participation Quality:  Appropriate and Attentive  Affect:  Appropriate  Cognitive:  Alert and Appropriate  Insight: Appropriate  Engagement in Group:  Engaged  Modes of Intervention:  Discussion  Additional Comments:  Pt. Was attentive and appropriate during tonight's group discussion. Pt shared that he is now ready for rehab and that he is now serious about stop drinking. Pt shared how he has been drinking for years.   Bing Plume D 03/02/2014, 9:56 PM

## 2014-03-03 MED ORDER — BUPROPION HCL ER (XL) 150 MG PO TB24: 150.0000 mg | ORAL_TABLET | Freq: Every day | ORAL | Status: DC

## 2014-03-03 MED ORDER — TRAZODONE HCL 150 MG PO TABS: 150.0000 mg | ORAL_TABLET | Freq: Every day | ORAL | Status: DC

## 2014-03-03 MED ORDER — DULOXETINE HCL 60 MG PO CPEP: 60.0000 mg | ORAL_CAPSULE | Freq: Every day | ORAL | Status: DC

## 2014-03-03 MED ORDER — QUETIAPINE FUMARATE 300 MG PO TABS: 300.0000 mg | ORAL_TABLET | Freq: Every day | ORAL | Status: DC

## 2014-03-03 MED ORDER — HYDROXYZINE HCL 25 MG PO TABS: 25.0000 mg | ORAL_TABLET | Freq: Four times a day (QID) | ORAL | Status: DC | PRN

## 2014-03-03 MED ORDER — GABAPENTIN 300 MG PO CAPS: 300.0000 mg | ORAL_CAPSULE | Freq: Three times a day (TID) | ORAL | Status: DC

## 2014-03-03 NOTE — [facility] Suicide Risk Assessment (Signed)
Suicide Risk Assessment  Discharge Assessment     Demographic Factors:  Male and Caucasian  Total Time spent with patient: 30 minutes  Psychiatric Specialty Exam:     Blood pressure 107/61, pulse 98, temperature 97.7 F (36.5 C), temperature source Oral, resp. rate 20, height 6' (1.829 m), weight 131.997 kg (291 lb), SpO2 99.00%.Body mass index is 39.46 kg/(m^2).  General Appearance: Fairly Groomed  Patent attorneyye Contact::  Fair  Speech:  Clear and Coherent  Volume:  Normal  Mood:  Euthymic  Affect:  Appropriate  Thought Process:  Coherent and Goal Directed  Orientation:  Full (Time, Place, and Person)  Thought Content:  worries concerns plans as he moves on  Suicidal Thoughts:  No  Homicidal Thoughts:  No  Memory:  Immediate;   Fair Recent;   Fair Remote;   Fair  Judgement:  Fair  Insight:  Present  Psychomotor Activity:  Normal  Concentration:  Fair  Recall:  [employer]  Fund of Knowledge:NA  Language: Fair  Akathisia:  No  Handed:    AIMS (if indicated):     Assets:  Desire for Improvement  Sleep:  Number of Hours: 5.5    Musculoskeletal: Strength & Muscle Tone: within normal limits Gait & Station: normal Patient leans: N/A   Mental Status Per Nursing Assessment::   On Admission:     Current Mental Status by Physician: in full contact with reality. There are no active SI plans or intent. Willing to pursue outpatient treatment. Will be going to [facility].   Loss Factors: Decrease in vocational status and Financial problems/change in socioeconomic status  Historical Factors: NA  Risk Reduction Factors:   will have a stable palce to [facility]  Continued Clinical Symptoms:  Depression:   Comorbid alcohol abuse/dependence  Cognitive Features That Contribute To Risk:  Closed-mindedness Polarized thinking Thought constriction (tunnel vision)    Suicide Risk:  Minimal: No identifiable suicidal ideation.  Patients presenting with no  risk factors but with morbid ruminations; may be classified as minimal risk based on the severity of the depressive symptoms  Discharge Diagnoses:   AXIS I:  Major Depression with psychotic features, Alcohol Abuse AXIS II:  No diagnosis AXIS III:   Past Medical History  Diagnosis Date  . Mental disorder   . Hypertension   . Depression   . Bipolar 1 disorder   . Irregular heart rate   . Asthma    AXIS IV:  other psychosocial or environmental problems AXIS V:  61-70 mild symptoms  Plan Of Care/Follow-up recommendations:  Activity:  as tolerated Diet:  regular Follow up outpatient basis/[facility] Is patient on multiple antipsychotic therapies at discharge:  No   Has Patient had three or more failed trials of antipsychotic monotherapy by history:  No  Recommended Plan for Multiple Antipsychotic Therapies: NA    Avonell Lenig A 03/03/2014, 10:49 AM

## 2014-03-03 NOTE — Progress Notes (Signed)
D: Pt is currently denying any SI/HI/AVH. Pt verbalizes awareness of his qhs medications and their prescribed indications. Pt endorses anxiety this evening. Pt actively participates within the milieu. Pt verbalizes readiness to be discharged to a rehab facility. Pt had no concerns he wished for this writer to address at this time.  A: Writer administered scheduled and prn medications to pt. Continued support and availability as needed was extended to this pt. Staff continue to monitor pt with q2215min checks.  R: No adverse drug reactions noted. Pt receptive to treatment. Pt remains safe at this time.

## 2014-03-03 NOTE — Progress Notes (Signed)
[facility] Adult Case Management Discharge Plan :  Will you be returning to the same living situation after discharge: No. Patient is discharging to [facility]. At discharge, do you have transportation home?:Yes,  Patient assisted with bus pass/fare and train ticket. Do you have the ability to pay for your medications:No.  Patient needs assistance with indigent medications   Release of information consent forms completed and in the chart;  Patient's signature needed at discharge.  Patient to Follow up at: Follow-up Information   Follow up with [facility] On 03/06/2014. (Friday, March 06, 2014 at 10 AM)    Contact information:   [address] [place], [place]   [address]  [phone]      Patient denies SI/HI:  No.  Patient endorses SI but no intent to act of thoughts.    Safety Planning and Suicide Prevention discussed: .Reviewed with all patients during discharge planning group    Miski Feldpausch Hairston 03/03/2014, 11:15 AM

## 2014-03-03 NOTE — Discharge Summary (Signed)
Physician Discharge Summary Note  Patient:  [name] [name] M Berns is an 33 y.o., male MRN:  [id number] DOB:  06/20/1980 Patient phone:  [phone] (home)  Patient address:   [address] [place] [address],   Date of Admission:  02/22/2014 Date of Discharge:  03/03/2014   Reason for Admission: Depression and Alcohol abuse  Discharge Diagnoses: Active Problems:   Severe recurrent major depression with psychotic features  Review of Systems  Constitutional: Negative.  Negative for fever, chills, weight loss, malaise/fatigue and diaphoresis.  HENT: Negative for congestion and sore throat.   Eyes: Negative for blurred vision, double vision and photophobia.  Respiratory: Negative for cough, shortness of breath and wheezing.   Cardiovascular: Negative for chest pain, palpitations and PND.  Gastrointestinal: Negative for heartburn, nausea, vomiting, abdominal pain, diarrhea and constipation.  Musculoskeletal: Negative for falls, joint pain and myalgias.  Neurological: Negative for dizziness, tingling, tremors, sensory change, speech change, focal weakness, seizures, loss of consciousness, weakness and headaches.  Endo/Heme/Allergies: Negative for polydipsia. Does not bruise/bleed easily.  Psychiatric/Behavioral: Negative for depression, suicidal ideas, hallucinations, memory loss and substance abuse. The patient is not nervous/anxious and does not have insomnia.     Discharge Diagnoses:  AXIS I: Major Depression with psychotic features, Alcohol Abuse  AXIS II: No diagnosis  AXIS III:  Past Medical History   Diagnosis  Date   .  Mental disorder    .  Hypertension    .  Depression    .  Bipolar 1 disorder    .  Irregular heart rate    .  Asthma     AXIS IV: other psychosocial or environmental problems  AXIS V: 61-70 mild symptoms   General Appearance: Fairly Groomed   Patent attorneyye Contact:: Fair   Speech: Clear and Coherent   Volume: Normal   Mood: Euthymic   Affect: Appropriate    Thought Process: Coherent and Goal Directed   Orientation: Full (Time, Place, and Person)   Thought Content: worries concerns plans as he moves on   Suicidal Thoughts: No   Homicidal Thoughts: No   Memory: Immediate; Fair  Recent; Fair  Remote; Fair   Judgement: Fair   Insight: Present   Psychomotor Activity: Normal   Concentration: Fair   Recall: [employer]   Fund of Knowledge:NA   Language: Fair   Akathisia: No   Handed:   AIMS (if indicated):   Assets: Desire for Improvement   Sleep: Number of Hours: 5.5    Musculoskeletal:  Strength & Muscle Tone: within normal limits  Gait & Station: normal  Patient leans: N/A   Level of Care: OP  Hospital Course: Cristal DeerChristopher is a 33 yr old caucasian male who [facility] and [facility]. "I had not been taking meds for the last 6 mos because I was feeling good." Three days ago, Cristal DeerChristopher developed SI/AH and generally was not feeling well. He states that he has had ongoing, intermittent depression and SI for over 4 years. He denied having a suicidal plan. He just hears soft voices from time to time.   During Hospitalization: Medications managed, psychoeducation, group and individual therapy. Pt currently denies SI, HI, and Psychosis. At discharge, pt rates anxiety and depression as minimal. Pt states that he does have a good supportive home environment and will followup with outpatient treatment. Affirms agreement with medication regimen and discharge plan. Denies other physical and psychological concerns at time  of discharge.    Consults:  None  Significant Diagnostic Studies:  None  Discharge Vitals:   Blood pressure 107/61, pulse 98, temperature 97.7 F (36.5 C), temperature source Oral, resp. rate 20, height 6' (1.829 m), weight 131.997 kg (291 lb), SpO2 99.00%. Body mass index is 39.46 kg/(m^2). Lab Results:   No results found for this or any previous  visit (from the past 72 hour(s)).  Physical Findings: AIMS: Facial and Oral Movements Muscles of Facial Expression: None, normal Lips and Perioral Area: None, normal Jaw: None, normal Tongue: None, normal,Extremity Movements Upper (arms, wrists, hands, fingers): None, normal Lower (legs, knees, ankles, toes): None, normal, Trunk Movements Neck, shoulders, hips: None, normal, Overall Severity Severity of abnormal movements (highest score from questions above): None, normal Incapacitation due to abnormal movements: None, normal Patient's awareness of abnormal movements (rate only patient's report): No Awareness, Dental Status Current problems with teeth and/or dentures?: No Does patient usually wear dentures?: No  CIWA:    COWS:     Psychiatric Specialty Exam: See Psychiatric Specialty Exam and Suicide Risk Assessment completed by Attending Physician prior to discharge.  Discharge destination:  Home  Is patient on multiple antipsychotic therapies at discharge:  No   Has Patient had three or more failed trials of antipsychotic monotherapy by history:  No  Recommended Plan for Multiple Antipsychotic Therapies: NA     Medication List       Indication   buPROPion 150 MG 24 hr tablet  Commonly known as:  WELLBUTRIN XL  Take 1 tablet (150 mg total) by mouth daily.   Indication:  mood stabilization     DULoxetine 60 MG capsule  Commonly known as:  CYMBALTA  Take 1 capsule (60 mg total) by mouth daily.   Indication:  mood stabilization     gabapentin 300 MG capsule  Commonly known as:  NEURONTIN  Take 1 capsule (300 mg total) by mouth 3 (three) times daily.   Indication:  mood stabilization     hydrOXYzine 25 MG tablet  Commonly known as:  ATARAX/VISTARIL  Take 1 tablet (25 mg total) by mouth every 6 (six) hours as needed for anxiety.   Indication:  anxiety     QUEtiapine 300 MG tablet  Commonly known as:  SEROQUEL  Take 1 tablet (300 mg total) by mouth at bedtime.    Indication:  depression with psychosis     traZODone 150 MG tablet  Commonly known as:  DESYREL  Take 1 tablet (150 mg total) by mouth at bedtime.   Indication:  Trouble Sleeping       Follow-up Information   Follow up with [facility] On 03/06/2014. (Friday, March 06, 2014 at 10 AM)    Contact information:   [address] [place], [place]   [address]  [phone]      Follow-up recommendations:   Activities: Resume activity as tolerated. Diet: Heart healthy low sodium diet Tests: Follow up testing will be determined by your out patient provider.  Comments:   Take all medications as prescribed. Keep all follow-up appointments as scheduled.  Do not consume alcohol or use illegal drugs while on prescription medications. Report any adverse effects from your medications to your primary care provider promptly.  In the event of recurrent symptoms or worsening symptoms, call 911, a crisis hotline, or go to the nearest emergency department for evaluation.   Total Discharge Time:  Less than 30 minutes.  Signed: Beau Fanny, FNP-BC 03/03/2014,  11:25 AM I personally assessed the patient and formulated the plan Madie Reno A. Dub Mikes, M.D.

## 2014-03-03 NOTE — Progress Notes (Signed)
Writer reviewed pt discharge instructions with pt including medications, follow up care and crisis intervention. Pt acknowledged understanding of instructions and states that he knows what he need to do to stay sober. Pt denies SI/HI and AVH. Pt mood and affect are appropriate to the situation. Writer returned pt belongings from locker, and the pt is released into his own care.

## 2014-03-03 NOTE — Tx Team (Addendum)
Interdisciplinary Treatment Plan Update   Date Reviewed:  03/03/2014  Time Reviewed:  11:17 AM  Progress in Treatment:   Attending groups: Yes Participating in groups: Yes Taking medication as prescribed: Yes  Tolerating medication: Yes Family/Significant other contact made:  No, patient declined collateral contact Patient understands diagnosis: Yes  Discussing patient identified problems/goals with staff: Yes, patient talked about the need to be stabilized on meds Medical problems stabilized or resolved: Yes Denies suicidal/homicidal ideation: No.  Patient endorses SI but no intent to act on thoughts Patient has not harmed self or others: Yes  For review of initial/current patient goals, please see plan of care.  Estimated Length of Stay:  Discharge today  Reasons for Continued Hospitalization:  Anxiety Depression Medication stabilization   New Problems/Goals identified:  .  Discharge Plan or Barriers:   Patient discharging [facility] [place]  Additional Comments:    Patient and CSW reviewed patient's identified goals and treatment plan.  Patient verbalized understanding and agreed to treatment plan.   Attendees:  Patient:  03/03/2014 11:17 AM   Signature: Sallyanne HaversF. Cobos, MD 03/03/2014 11:17 AM  Signature: Geoffery LyonsIrving Lugo, MD 03/03/2014 11:17 AM  Signature:  Liborio NixonPatrice White, RN  03/03/2014 11:17 AM  Signature:  Roswell Minersonna Shimp, RN 03/03/2014 11:17 AM  Signature:   03/03/2014 11:17 AM  Signature:  Juline PatchQuylle Phill Steck, LCSW 03/03/2014 11:17 AM  Signature:  Trula SladeHeather Smart, LCSW-A 03/03/2014 11:17 AM  Signature:  Leisa LenzValerie Enoch, Care Coordinator [facility] 03/03/2014 11:17 AM  Signature: 03/03/2014 11:17 AM  Signature: 03/03/2014  11:17 AM  Signature:   Onnie BoerJennifer Clark, RN [facility] 03/03/2014  11:17 AM  Signature:   03/03/2014  11:17 AM    Scribe for Treatment Team:   Juline PatchQuylle Kamaria Lucia,  03/03/2014 11:17 AM

## 2014-03-04 NOTE — ED Provider Notes (Signed)
Medical screening examination/treatment/procedure(s) were performed by non-physician practitioner and as supervising physician I was immediately available for consultation/collaboration.   EKG Interpretation None       Barbarajean Kinzler M Ohana Birdwell, MD 03/04/14 1320 

## 2014-03-06 NOTE — Progress Notes (Signed)
Patient Discharge Instructions:  After Visit Summary (AVS):   Faxed to:  03/06/14 Psychiatric Admission Assessment Note:   Faxed to:  03/06/14 Suicide Risk Assessment - Discharge Assessment:   Faxed to:  03/06/14 Faxed/Sent to the Next Level Care provider:  03/06/14 Faxed to [facility] @ [phone]  Jerelene ReddenSheena E [place], 03/06/2014, 9:56 AM

## 2014-03-07 ENCOUNTER — Encounter (HOSPITAL_COMMUNITY): Payer: Self-pay | Admitting: Emergency Medicine

## 2014-03-07 DIAGNOSIS — Z7982 Long term (current) use of aspirin: Secondary | ICD-10-CM | POA: Insufficient documentation

## 2014-03-07 DIAGNOSIS — Z79899 Other long term (current) drug therapy: Secondary | ICD-10-CM | POA: Insufficient documentation

## 2014-03-07 DIAGNOSIS — Z87891 Personal history of nicotine dependence: Secondary | ICD-10-CM | POA: Insufficient documentation

## 2014-03-07 DIAGNOSIS — R112 Nausea with vomiting, unspecified: Secondary | ICD-10-CM | POA: Insufficient documentation

## 2014-03-07 DIAGNOSIS — I10 Essential (primary) hypertension: Secondary | ICD-10-CM | POA: Insufficient documentation

## 2014-03-07 DIAGNOSIS — J45909 Unspecified asthma, uncomplicated: Secondary | ICD-10-CM | POA: Insufficient documentation

## 2014-03-07 DIAGNOSIS — F319 Bipolar disorder, unspecified: Secondary | ICD-10-CM | POA: Insufficient documentation

## 2014-03-07 LAB — URINALYSIS, ROUTINE W REFLEX MICROSCOPIC
Bilirubin Urine: NEGATIVE
Glucose, UA: NEGATIVE mg/dL
HGB URINE DIPSTICK: NEGATIVE
Ketones, ur: NEGATIVE mg/dL
Leukocytes, UA: NEGATIVE
Nitrite: NEGATIVE
PH: 5 (ref 5.0–8.0)
Protein, ur: NEGATIVE mg/dL
SPECIFIC GRAVITY, URINE: 1.029 (ref 1.005–1.030)
UROBILINOGEN UA: 0.2 mg/dL (ref 0.0–1.0)

## 2014-03-07 MED ORDER — ONDANSETRON 4 MG PO TBDP
8.0000 mg | ORAL_TABLET | Freq: Once | ORAL | Status: AC
  Administered 2014-03-07: 8 mg via ORAL
  Filled 2014-03-07: qty 2

## 2014-03-07 NOTE — ED Notes (Signed)
Pt texting on his phone while being triaged

## 2014-03-07 NOTE — ED Notes (Signed)
The pt has abd pain with nv since last pm.  Diarrhea also.  Unable to keep his meds down

## 2014-03-08 ENCOUNTER — Emergency Department (HOSPITAL_COMMUNITY)
Admission: EM | Admit: 2014-03-08 | Discharge: 2014-03-08 | Disposition: A | Discharge status: Home / Self Care | Payer: Self-pay | Attending: Emergency Medicine | Admitting: Emergency Medicine

## 2014-03-08 DIAGNOSIS — R112 Nausea with vomiting, unspecified: Secondary | ICD-10-CM

## 2014-03-08 LAB — BASIC METABOLIC PANEL
Anion gap: 12 (ref 5–15)
BUN: 13 mg/dL (ref 6–23)
CHLORIDE: 103 meq/L (ref 96–112)
CO2: 26 mEq/L (ref 19–32)
Calcium: 9.5 mg/dL (ref 8.4–10.5)
Creatinine, Ser: 0.86 mg/dL (ref 0.50–1.35)
GFR calc non Af Amer: >90 mL/min (ref >90)
Glucose, Bld: 92 mg/dL (ref 70–99)
Potassium: 3.9 mEq/L (ref 3.7–5.3)
Sodium: 141 mEq/L (ref 137–147)

## 2014-03-08 LAB — CBC WITH DIFFERENTIAL/PLATELET
Basophils Absolute: 0 10*3/uL (ref 0.0–0.1)
Basophils Relative: 0 % (ref 0–1)
Eosinophils Absolute: 0.1 10*3/uL (ref 0.0–0.7)
Eosinophils Relative: 1 % (ref 0–5)
HCT: 42.3 % (ref 39.0–52.0)
HEMOGLOBIN: 14.5 g/dL (ref 13.0–17.0)
Lymphocytes Relative: 21 % (ref 12–46)
Lymphs Abs: 2.2 10*3/uL (ref 0.7–4.0)
MCH: 30.5 pg (ref 26.0–34.0)
MCHC: 34.3 g/dL (ref 30.0–36.0)
MCV: 88.9 fL (ref 78.0–100.0)
MONO ABS: 0.8 10*3/uL (ref 0.1–1.0)
Monocytes Relative: 8 % (ref 3–12)
NEUTROS ABS: 7.4 10*3/uL (ref 1.7–7.7)
Neutrophils Relative %: 70 % (ref 43–77)
Platelets: 311 10*3/uL (ref 150–400)
RBC: 4.76 MIL/uL (ref 4.22–5.81)
RDW: 12.8 % (ref 11.5–15.5)
WBC: 10.5 10*3/uL (ref 4.0–10.5)

## 2014-03-08 MED ORDER — ONDANSETRON 8 MG PO TBDP: 8.0000 mg | ORAL_TABLET | Freq: Three times a day (TID) | ORAL | Status: DC | PRN

## 2014-03-08 NOTE — Discharge Instructions (Signed)

## 2014-03-08 NOTE — ED Provider Notes (Signed)
CSN: [id number]     Arrival date & time 03/07/14  2239 History   First MD Initiated Contact with Patient 03/08/14 0132     Chief Complaint  Patient presents with  . Emesis     (Consider location/radiation/quality/duration/timing/severity/associated sxs/prior Treatment) HPI 33 year old male presents to the emergency department from home with complaint of nausea and vomiting.  He denies any abdominal pain.  Symptoms started yesterday evening.  He reports he is a friend's house on Friday, and a 8 at a friend's house 2 days ago, then had cookout.  He denies any fever or chills.  He denies any diarrhea.  Since arriving in the emergency apartment, he has received Zofran and has had no further vomiting. Past Medical History  Diagnosis Date  . Mental disorder   . Hypertension   . Depression   . Bipolar 1 disorder   . Irregular heart rate   . Asthma    History reviewed. No pertinent past surgical history. Family History  Problem Relation Age of Onset  . Hypertension Mother   . Hypertension Other    History  Substance Use Topics  . Smoking status: Former Smoker -- 0.50 packs/day  . Smokeless tobacco: Never Used  . Alcohol Use: 0.0 oz/week     Comment: pt states he has been drinking 12 beers and 6 shots daily for several weeks    Review of Systems  See History of Present Illness; otherwise all other systems are reviewed and negative   Allergies  Review of patient's allergies indicates no known allergies.  Home Medications   Prior to Admission medications   Medication Sig Start Date End Date Taking? Authorizing Provider  aspirin-acetaminophen-caffeine (EXCEDRIN MIGRAINE) [phone] MG per tablet Take 3 tablets by mouth daily as needed for headache or migraine.   Yes Historical Provider, MD  buPROPion (WELLBUTRIN XL) 150 MG 24 hr tablet Take 150 mg by mouth daily.   Yes Historical Provider, MD  DULoxetine (CYMBALTA) 60 MG capsule Take 60 mg by mouth daily.   Yes Historical  Provider, MD  gabapentin (NEURONTIN) 300 MG capsule Take 300 mg by mouth 3 (three) times daily.   Yes Historical Provider, MD  hydrOXYzine (ATARAX/VISTARIL) 25 MG tablet Take 25 mg by mouth every 6 (six) hours as needed for anxiety.   Yes Historical Provider, MD  QUEtiapine (SEROQUEL) 300 MG tablet Take 300 mg by mouth at bedtime.   Yes Historical Provider, MD  traZODone (DESYREL) 150 MG tablet Take 150 mg by mouth at bedtime.   Yes Historical Provider, MD   BP 116/76  Pulse 67  Temp(Src) 97.8 F (36.6 C) (Oral)  Resp 19  Ht 6\' 1"  (1.854 m)  Wt 295 lb (133.811 kg)  BMI 38.93 kg/m2  SpO2 97% Physical Exam  Nursing note and vitals reviewed. Constitutional: He is oriented to person, place, and time. He appears well-developed and well-nourished. No distress.  HENT:  Head: Normocephalic and atraumatic.  Nose: Nose normal.  Mouth/Throat: Oropharynx is clear and moist.  Eyes: Conjunctivae and EOM are normal. Pupils are equal, round, and reactive to light.  Neck: Normal range of motion. Neck supple. No JVD present. No tracheal deviation present. No thyromegaly present.  Cardiovascular: Normal rate, regular rhythm, normal heart sounds and intact distal pulses.  Exam reveals no gallop and no friction rub.   No murmur heard. Pulmonary/Chest: Effort normal and breath sounds normal. No stridor. No respiratory distress. He has no wheezes. He has no rales. He exhibits no tenderness.  Abdominal:  Soft. Bowel sounds are normal. He exhibits no distension and no mass. There is no tenderness. There is no rebound and no guarding.  Musculoskeletal: Normal range of motion. He exhibits no edema and no tenderness.  Lymphadenopathy:    He has no cervical adenopathy.  Neurological: He is alert and oriented to person, place, and time. He displays normal reflexes. He exhibits normal muscle tone. Coordination normal.  Skin: Skin is warm and dry. No rash noted. No erythema. No pallor.  Psychiatric: He has a normal  mood and affect. His behavior is normal. Judgment and thought content normal.    ED Course  Procedures (including critical care time) Labs Review Labs Reviewed  CBC WITH DIFFERENTIAL  BASIC METABOLIC PANEL  URINALYSIS, ROUTINE W REFLEX MICROSCOPIC    Imaging Review No results found.   EKG Interpretation None      MDM   Final diagnoses:  Non-intractable vomiting with nausea, vomiting of unspecified type    33 year old male with 2 days of nausea and vomiting, no abdominal pain.  Suspect food poisoning.  Patient has passed by mouth challenge here, will discharge home.   Olivia Mackie, MD 03/08/14 [phone]

## 2014-03-08 NOTE — ED Notes (Signed)
Pt. Refused Wheelchair 

## 2014-03-08 NOTE — ED Notes (Signed)
Pt placed on 5 lead EKG, BP Cuff and finger pulse oximetry  

## 2014-03-09 ENCOUNTER — Encounter (HOSPITAL_COMMUNITY): Payer: Self-pay | Admitting: Emergency Medicine

## 2014-03-09 ENCOUNTER — Emergency Department (HOSPITAL_COMMUNITY)
Admission: EM | Admit: 2014-03-09 | Discharge: 2014-03-09 | Disposition: A | Discharge status: Home / Self Care | Payer: Self-pay | Attending: Emergency Medicine | Admitting: Emergency Medicine

## 2014-03-09 DIAGNOSIS — I10 Essential (primary) hypertension: Secondary | ICD-10-CM | POA: Insufficient documentation

## 2014-03-09 DIAGNOSIS — J45909 Unspecified asthma, uncomplicated: Secondary | ICD-10-CM | POA: Insufficient documentation

## 2014-03-09 DIAGNOSIS — R112 Nausea with vomiting, unspecified: Secondary | ICD-10-CM | POA: Insufficient documentation

## 2014-03-09 DIAGNOSIS — Z7982 Long term (current) use of aspirin: Secondary | ICD-10-CM | POA: Insufficient documentation

## 2014-03-09 DIAGNOSIS — Z720 Tobacco use: Secondary | ICD-10-CM | POA: Insufficient documentation

## 2014-03-09 DIAGNOSIS — Z79899 Other long term (current) drug therapy: Secondary | ICD-10-CM | POA: Insufficient documentation

## 2014-03-09 DIAGNOSIS — F319 Bipolar disorder, unspecified: Secondary | ICD-10-CM | POA: Insufficient documentation

## 2014-03-09 LAB — URINALYSIS, ROUTINE W REFLEX MICROSCOPIC
BILIRUBIN URINE: NEGATIVE
GLUCOSE, UA: NEGATIVE mg/dL
Hgb urine dipstick: NEGATIVE
KETONES UR: NEGATIVE mg/dL
LEUKOCYTES UA: NEGATIVE
Nitrite: NEGATIVE
PH: 5.5 (ref 5.0–8.0)
Protein, ur: NEGATIVE mg/dL
Specific Gravity, Urine: 1.033 — ABNORMAL HIGH (ref 1.005–1.030)
Urobilinogen, UA: 0.2 mg/dL (ref 0.0–1.0)

## 2014-03-09 LAB — CBC WITH DIFFERENTIAL/PLATELET
Basophils Absolute: 0 10*3/uL (ref 0.0–0.1)
Basophils Relative: 0 % (ref 0–1)
EOS PCT: 1 % (ref 0–5)
Eosinophils Absolute: 0.1 10*3/uL (ref 0.0–0.7)
HEMATOCRIT: 40 % (ref 39.0–52.0)
HEMOGLOBIN: 13.4 g/dL (ref 13.0–17.0)
LYMPHS ABS: 1.8 10*3/uL (ref 0.7–4.0)
Lymphocytes Relative: 21 % (ref 12–46)
MCH: 29.5 pg (ref 26.0–34.0)
MCHC: 33.5 g/dL (ref 30.0–36.0)
MCV: 87.9 fL (ref 78.0–100.0)
MONOS PCT: 11 % (ref 3–12)
Monocytes Absolute: 1 10*3/uL (ref 0.1–1.0)
Neutro Abs: 5.6 10*3/uL (ref 1.7–7.7)
Neutrophils Relative %: 67 % (ref 43–77)
Platelets: 305 10*3/uL (ref 150–400)
RBC: 4.55 MIL/uL (ref 4.22–5.81)
RDW: 12.7 % (ref 11.5–15.5)
WBC: 8.4 10*3/uL (ref 4.0–10.5)

## 2014-03-09 LAB — BASIC METABOLIC PANEL
Anion gap: 12 (ref 5–15)
BUN: 11 mg/dL (ref 6–23)
CHLORIDE: 104 meq/L (ref 96–112)
CO2: 25 mEq/L (ref 19–32)
Calcium: 9 mg/dL (ref 8.4–10.5)
Creatinine, Ser: 0.85 mg/dL (ref 0.50–1.35)
GFR calc Af Amer: >90 mL/min (ref >90)
GFR calc non Af Amer: >90 mL/min (ref >90)
GLUCOSE: 86 mg/dL (ref 70–99)
POTASSIUM: 3.6 meq/L — AB (ref 3.7–5.3)
Sodium: 141 mEq/L (ref 137–147)

## 2014-03-09 LAB — HEPATIC FUNCTION PANEL
ALBUMIN: 4 g/dL (ref 3.5–5.2)
ALT: 18 U/L (ref 0–53)
AST: 16 U/L (ref 0–37)
Alkaline Phosphatase: 101 U/L (ref 39–117)
BILIRUBIN TOTAL: 0.9 mg/dL (ref 0.3–1.2)
Total Protein: 7.2 g/dL (ref 6.0–8.3)

## 2014-03-09 LAB — LIPASE, BLOOD: Lipase: 28 U/L (ref 11–59)

## 2014-03-09 MED ORDER — ONDANSETRON 4 MG PO TBDP: 8.0000 mg | ORAL_TABLET | Freq: Once | ORAL | Status: DC

## 2014-03-09 MED ORDER — SODIUM CHLORIDE 0.9 % IV BOLUS (SEPSIS)
1000.0000 mL | Freq: Once | INTRAVENOUS | Status: AC
  Administered 2014-03-09: 1000 mL via INTRAVENOUS

## 2014-03-09 MED ORDER — ONDANSETRON HCL 4 MG/2ML IJ SOLN
4.0000 mg | Freq: Once | INTRAMUSCULAR | Status: AC
  Administered 2014-03-09: 4 mg via INTRAVENOUS
  Filled 2014-03-09: qty 2

## 2014-03-09 MED ORDER — PANTOPRAZOLE SODIUM 40 MG IV SOLR
40.0000 mg | Freq: Once | INTRAVENOUS | Status: AC
  Administered 2014-03-09: 40 mg via INTRAVENOUS
  Filled 2014-03-09: qty 40

## 2014-03-09 NOTE — ED Provider Notes (Signed)
CSN: [id number]     Arrival date & time 03/09/14  0059 History   First MD Initiated Contact with Patient 03/09/14 0150     Chief Complaint  Patient presents with  . Emesis  . Chills  . Fatigue  . Diarrhea     (Consider location/radiation/quality/duration/timing/severity/associated sxs/prior Treatment) The history is provided by the patient and medical records. No language interpreter was used.    CLARANCE [name] is a 33 y.o. male  with a hx of HTN, depression, mental disorder, asthma presents to the Emergency Department complaining of acute improving nausea with vomiting.  Pt reports being seen last night for same. He reports after returning home he felt normal however he then presented to work later this evening. Upon arriving at work he drank a large glass of orange juice and then took Aleve, vomiting approximately 30 minutes later. He has had no further vomiting. He continues to deny abdominal pain. No associated symptoms.  Patient denies fever, chills, headache neck pain, chest pain, shortness of breath, abdominal pain, diarrhea, weakness, dizziness, syncope.     Past Medical History  Diagnosis Date  . Mental disorder   . Hypertension   . Depression   . Bipolar 1 disorder   . Irregular heart rate   . Asthma    History reviewed. No pertinent past surgical history. Family History  Problem Relation Age of Onset  . Hypertension Mother   . Hypertension Other    History  Substance Use Topics  . Smoking status: Current Every Day Smoker -- 0.50 packs/day  . Smokeless tobacco: Never Used  . Alcohol Use: 0.0 oz/week     Comment: pt states he has been drinking 12 beers and 6 shots daily for several weeks    Review of Systems  Constitutional: Negative for fever, diaphoresis, appetite change, fatigue and unexpected weight change.  HENT: Negative for mouth sores.   Eyes: Negative for visual disturbance.  Respiratory: Negative for cough, chest tightness, shortness of breath and  wheezing.   Cardiovascular: Negative for chest pain.  Gastrointestinal: Positive for nausea and vomiting. Negative for abdominal pain, diarrhea and constipation.  Endocrine: Negative for polydipsia, polyphagia and polyuria.  Genitourinary: Negative for dysuria, urgency, frequency and hematuria.  Musculoskeletal: Negative for back pain and neck stiffness.  Skin: Negative for rash.  Allergic/Immunologic: Negative for immunocompromised state.  Neurological: Negative for syncope, light-headedness and headaches.  Hematological: Does not bruise/bleed easily.  Psychiatric/Behavioral: Negative for sleep disturbance. The patient is not nervous/anxious.       Allergies  Review of patient's allergies indicates no known allergies.  Home Medications   Prior to Admission medications   Medication Sig Start Date End Date Taking? Authorizing Provider  aspirin-acetaminophen-caffeine (EXCEDRIN MIGRAINE) [phone] MG per tablet Take 3 tablets by mouth daily as needed for headache or migraine.   Yes Historical Provider, MD  buPROPion (WELLBUTRIN XL) 150 MG 24 hr tablet Take 150 mg by mouth daily.   Yes Historical Provider, MD  DULoxetine (CYMBALTA) 60 MG capsule Take 60 mg by mouth daily.   Yes Historical Provider, MD  gabapentin (NEURONTIN) 300 MG capsule Take 300 mg by mouth 3 (three) times daily.   Yes Historical Provider, MD  hydrOXYzine (ATARAX/VISTARIL) 25 MG tablet Take 25 mg by mouth every 6 (six) hours as needed for anxiety.   Yes Historical Provider, MD  ondansetron (ZOFRAN ODT) 8 MG disintegrating tablet Take 1 tablet (8 mg total) by mouth every 8 (eight) hours as needed for nausea or vomiting.  03/08/14  Yes Olivia Mackielga M Otter, MD  QUEtiapine (SEROQUEL) 300 MG tablet Take 300 mg by mouth at bedtime.   Yes Historical Provider, MD  traZODone (DESYREL) 150 MG tablet Take 150 mg by mouth at bedtime.   Yes Historical Provider, MD   BP 114/76  Pulse 71  Temp(Src) 97 F (36.1 C) (Rectal)  Resp 17  SpO2  98% Physical Exam  Nursing note and vitals reviewed. Constitutional: He appears well-developed and well-nourished. No distress.  Awake, alert, nontoxic appearance  HENT:  Head: Normocephalic and atraumatic.  Mouth/Throat: Oropharynx is clear and moist. No oropharyngeal exudate.  Eyes: Conjunctivae are normal. No scleral icterus.  Neck: Normal range of motion. Neck supple.  Cardiovascular: Normal rate, regular rhythm, normal heart sounds and intact distal pulses.   No murmur heard. No tachycardia.  Pulmonary/Chest: Effort normal and breath sounds normal. No respiratory distress. He has no wheezes.  Equal chest expansion  Abdominal: Soft. Bowel sounds are normal. He exhibits no distension and no mass. There is no tenderness. There is no rebound and no guarding.  Musculoskeletal: Normal range of motion. He exhibits no edema.  Neurological: He is alert.  Speech is clear and goal oriented Moves extremities without ataxia  Skin: Skin is warm and dry. He is not diaphoretic.  Psychiatric: He has a normal mood and affect.    ED Course  Procedures (including critical care time) Labs Review Labs Reviewed  BASIC METABOLIC PANEL - Abnormal; Notable for the following:    Potassium 3.6 (*)    All other components within normal limits  URINALYSIS, ROUTINE W REFLEX MICROSCOPIC - Abnormal; Notable for the following:    Specific Gravity, Urine 1.033 (*)    All other components within normal limits  CBC WITH DIFFERENTIAL  LIPASE, BLOOD  HEPATIC FUNCTION PANEL    Imaging Review No results found.   EKG Interpretation None      MDM   Final diagnoses:  Non-intractable vomiting with nausea, vomiting of unspecified type   [name] presents after one episode of nausea and vomiting tonight. Patient diagnosed with possible food poisoning last night and was feeling better until use of NSAID and orange juice.  No further episodes of emesis. Patient remains without abdominal pain.  Abdomen soft and nontender without rebound or peritoneal signs. Patient reports no further symptoms.  Labs reassuring.  Patient reports he did not fill or attempt to take his Zofran he was prescribed last night. Encouraged him to do so tonight.  Patient is nontoxic, nonseptic appearing, in no apparent distress.  Patient's pain and other symptoms adequately managed in emergency department.  Fluid bolus given.  Labs and vitals reviewed.  Patient does not meet the SIRS or Sepsis criteria.  On repeat exam patient does not have a surgical abdomin and there are no peritoneal signs.  No indication of appendicitis, bowel obstruction, bowel perforation, cholecystitis, diverticulitis.  Patient is tolerating by mouth here in the emergency department without difficulty. Patient discharged with instructions to fill his Zofran and take as needed and given strict instructions for follow-up with their primary care physician.  I have also discussed reasons to return immediately to the ER.  Patient expresses understanding and agrees with plan.  BP 114/76  Pulse 71  Temp(Src) 97 F (36.1 C) (Rectal)  Resp 17  SpO2 98%      Dierdre ForthHannah Rudolf Blizard, PA-C 03/09/14 0423

## 2014-03-09 NOTE — Discharge Instructions (Signed)
1. Medications: Zofran, usual home medications 2. Treatment: rest, drink plenty of fluids, advance diet slowly, no NSAIDs or orange juice 3. Follow Up: Please followup with your primary doctor for discussion of your diagnoses and further evaluation after today's visit; if you do not have a primary care doctor use the resource guide provided to find one;     Nausea and Vomiting Nausea means you feel sick to your stomach. Throwing up (vomiting) is a reflex where stomach contents come out of your mouth. HOME CARE   Take medicine as told by your doctor.  Do not force yourself to eat. However, you do need to drink fluids.  If you feel like eating, eat a normal diet as told by your doctor.  Eat rice, wheat, potatoes, bread, lean meats, yogurt, fruits, and vegetables.  Avoid high-fat foods.  Drink enough fluids to keep your pee (urine) clear or pale yellow.  Ask your doctor how to replace body fluid losses (rehydrate). Signs of body fluid loss (dehydration) include:  Feeling very thirsty.  Dry lips and mouth.  Feeling dizzy.  Dark pee.  Peeing less than normal.  Feeling confused.  Fast breathing or heart rate. GET HELP RIGHT AWAY IF:   You have blood in your throw up.  You have black or bloody poop (stool).  You have a bad headache or stiff neck.  You feel confused.  You have bad belly (abdominal) pain.  You have chest pain or trouble breathing.  You do not pee at least once every 8 hours.  You have cold, clammy skin.  You keep throwing up after 24 to 48 hours.  You have a fever. MAKE SURE YOU:   Understand these instructions.  Will watch your condition.  Will get help right away if you are not doing well or get worse. Document Released: 11/08/2007 Document Revised: 08/14/2011 Document Reviewed: 10/21/2010 [facility] Patient Information 2015 [place], [place]. This information is not intended to replace advice given to you by your health care provider. Make sure  you discuss any questions you have with your health care provider.    Emergency Department Resource Guide 1) Find a Doctor and Pay Out of Pocket Although you won't have to find out who is covered by your insurance plan, it is a good idea to ask around and get recommendations. You will then need to call the office and see if the doctor you have chosen will accept you as a new patient and what types of options they offer for patients who are self-pay. Some doctors offer discounts or will set up payment plans for their patients who do not have insurance, but you will need to ask so you aren't surprised when you get to your appointment.  2) Contact Your Local Health Department Not all health departments have doctors that can see patients for sick visits, but many do, so it is worth a call to see if yours does. If you don't know where your local health department is, you can check in your phone book. The CDC also has a tool to help you locate your state's health department, and many state websites also have listings of all of their local health departments.  3) Find a Walk-in Clinic If your illness is not likely to be very severe or complicated, you may want to try a walk in clinic. These are popping up all over the country in pharmacies, drugstores, and shopping centers. They're usually staffed by nurse practitioners or physician assistants that have been  trained to treat common illnesses and complaints. They're usually fairly quick and inexpensive. However, if you have serious medical issues or chronic medical problems, these are probably not your best option.  No Primary Care Doctor: - Call Health Connect at  [phone] - they can help you locate a primary care doctor that  accepts your insurance, provides certain services, etc. - Physician Referral Service- [phone]  Chronic Pain Problems: Organization         Address  Phone   Notes  [facility]  [phone] Patients  need to be referred by their primary care doctor.   Medication Assistance: Organization         Address  Phone   Notes  [facility] [address] [place], [place] [id number] [phone]) [phone] --Must be a resident of [facility] [place] [facility] -- Must have NO insurance coverage whatsoever (no Medicaid/ Medicare, etc.) -- The pt. MUST have a primary care doctor that directs their care regularly and follows them in the community   Med[facility]ist  [phone]   [employer]  [phone]    Agencies that provide inexpensive medical care: Organization         Address  Phone   Notes  [facility]  [phone]   [facility]    [phone]   [facility] [address] [place], [place] [address] [phone]) [phone]   [facility] [address], [place] [phone]) [phone]   [facility]anned Parenthood    [phone]) [phone]   [facility]    [phone]   [facility]  [address], [place] Phone:  [phone], Fax:  [phone]) [phone] Hours of Operation:  9 am - 6 pm, M-F.  Also accepts Medicaid/Medicare and self-pay.  [facility]  [address], [place] Phone: [phone](336) [phone], Fax: [phone]. Hours of Operation:  8:30 am - 5:30 pm, M-F.  Also accepts Medicaid and self-pay.  [facility] [place] [address], [place] Phone: [phone]) [phone]   [facility] [address], [place], [place] [phone], Ext. [phone] Mondays & Thursdays: 7-9 AM.  First 15 patients are seen on a first come, first serve basis.    Medicaid-accepting [facility] Providers:  Organization         Address  Phone   Notes  [facility] [address], [place] [phone] Also accepts self-pay patients.  [facility] [address], [place]  [phone]     [facility] [address], [place] [phone]   [facility] [address] [place] [address], [place] [phone]   Renaye RakersVeita Bland [address], [place]   [phone] Only accepts [place] Access [place] patients after they have their name applied to their card.   Self-Pay (no insurance) in [facility]:  Organization         Address  Phone   Notes  Sickle Cell Patients, [facility] [address], [place] [phone]   [facility] [address], [place] [phone]   [facility] [place]  [address], [place] [phone]) [phone]   [facility]/Dr. Osei-Bonsu  [address] [place] [address], [place] or  [id number], [address], [place] [phone] Phone number for both [facility] and [place] locations is the same.  [facility] [address] [place] [phone]   [facility] [address] [place] or [address] [phone]   [facility] [address], [place] [phone], phone; [phone], fax Sees patients 1st and 3rd Saturday of every month.  Must not qualify for public or private insurance (i.e. Medicaid, Medicare, [place] Health Choice, Veterans' Benefits)  Household income should be no more than 200% of the poverty level The clinic cannot treat you if you are pregnant or think you are pregnant  Sexually transmitted diseases are not treated at the clinic.    Dental Care: Organization         Address  Phone  Notes  [facility] [address] [place], [place] [phone] Accepts children up to age 27 who are enrolled in [place] or [place] Health Choice; pregnant women with a Medicaid card; and children who have applied for Medicaid or [place] Health Choice, but were declined,  whose parents can pay a reduced fee at time of service.  [facility]  [address], [place] [phone] Accepts children up to age 57 who are enrolled in [place] or [place] Health Choice; pregnant women with a Medicaid card; and children who have applied for Medicaid or [place] Health Choice, but were declined, whose parents can pay a reduced fee at time of service.  [facility]  [address] [place], [place] [phone] Patients are seen by appointment only. Walk-ins are not accepted. [facility] will see patients 44 years of age and older. Monday - Tuesday (8am-5pm) Most Wednesdays (8:30-5pm) $30 per visit, cash only  [facility] Adult Dental Access PROGRAM  [address], [facility] [phone] Patients are seen by appointment only. Walk-ins are not accepted. [facility] will see patients 54 years of age and older. One Wednesday Evening (Monthly: Volunteer Based).  $30 per visit, cash only  [employer]  [phone] for adults; Children under age 39, call Graduate Pediatric Dentistry at [phone]. Children aged 49-14, please call [phone] to request a pediatric application.  Dental services are provided in all areas of dental care including fillings, crowns and bridges, complete and partial dentures, implants, gum treatment, root canals, and extractions. Preventive care is also provided. Treatment is provided to both adults and children. Patients are selected via a lottery and there is often a waiting list.   [facility] [address] [place]  [phone] [email or web address]   [facility] [address] [place], [place] [phone], Ext. [phone] Second and Fourth Thursday of each month, opens at 6:30 AM; Clinic ends at 9 AM.  Patients are seen on a first-come first-served basis, and a limited number are seen during each clinic.   [facility]  [address] [place], [place] [phone]   Eligibility Requirements You must have lived in [place], [place], or [place] counties for at least the last three months.   You cannot be eligible for state or federal sponsored [place], including [employer], [place], or [employer].   You generally cannot be eligible for healthcare insurance through your employer.    How to apply: Eligibility screenings  are held every Tuesday and Wednesday afternoon from 1:00 pm until 4:00 pm. You do not need an appointment for the interview!  [facility] [address] [place], [place] [phone]   [facility] Health Department  [phone]   [facility] Health Department  [phone]   [facility] Health Department  [phone]    Behavioral Health Resources in the Community: Intensive Outpatient Programs Organization         Address  Phone  Notes  [facility] Services [address] [place], [place] [phone]   [facility] Outpatient [address] [place], [place] [phone]   [facility] [address], [place], [place]  [phone]   [facility] [address]  [place], [place] [phone] or [phone]   Substance Abuse Resources Organization         Address  Phone  Notes  [facility] Drug Services  [phone]   [facility]  [phone]   [facility] [place]  [phone]   Floydene Flock  [phone]   Residential & Outpatient Substance Abuse Program  [phone]   Psychological Services Organization         Address  Phone  Notes  [facility]  [phone]   [facility] Services  [phone]   [facility] [address] [place] [phone] or [phone]    Mobile Crisis Teams Organization         Address  Phone  Notes  [facility], Mobile Crisis Care Unit   [phone]   [facility]  [address] [place], [place] [phone]   Doristine Locks [address] [place] [phone]    Self-Help/Support Groups Organization         Address  Phone             Notes  [facility] [place] - variety of support groups  [phone]- I7437963 Call for more information  Narcotics Anonymous (NA), [facility] [address], [employer] [place]  2 meetings at this location   Statistician         Address  Phone  Notes  [facility] [address],    [place]  [phone]   [facility]  [address], [place] [address], [place], [place] [phone]   [facility] [address] [place], [place] [phone] Admissions: 8am-3pm M-F  [facility] [address]    [place], [place] [phone]   [facility] [address] [place], [place], [place] [phone]   [facility] [address],  [place], [place] [phone]   [facility] - Intensive Outpatient [address], [place], [place] [phone]   [facility] ([facility] [address]  [place], [place] [phone] or [phone]   [facility] ([facility]) [address], [place], [place] [phone] Accepts Medicaid  [facility] [place] [address],  [place] [phone] Substance Abuse/Addiction Treatment   [facility] Organization         Address  Phone  Notes  [facility]  6071478702   Angie Fava, PhD [address]vin Knack [place], [place]   [phone] or [phone]   [facility] Behavioral   [address] [place], [place] [phone]   [facility] [address], [place], [place] [phone] Insurance/Medicaid/sponsorship  through [facility] and Families [address]                                     [place], [place] [phone] Therapy/tele-psych/case  [facility] [address]   [place], [place] [phone]    Dr. Lolly Mustache  [phone]   [facility]  [employer] [facility] Dept. 1) [address], [place] 2) [address] [place] 3)  [address], [place] [phone]  [phone]   [facility] Child Abuse Hotline 607-015-7602 or 317-174-1713 (After Hours)

## 2014-03-09 NOTE — ED Notes (Addendum)
C/o nvd x3d. Also chills, sweats and generally weak. Last emesis 1 hr ago, last BM 2 hrs ago. Describes sx as all day long. Took 2 excedrin at ~ 2100. Went to work around 2000, drank OJ, sx got worse and vomited it back up, sent home from work.

## 2014-03-09 NOTE — ED Provider Notes (Signed)
Medical screening examination/treatment/procedure(s) were performed by non-physician practitioner and as supervising physician I was immediately available for consultation/collaboration.   EKG Interpretation None       Larua Collier M Brandan Robicheaux, MD 03/09/14 0605 

## 2014-03-29 ENCOUNTER — Encounter (HOSPITAL_COMMUNITY): Payer: Self-pay | Admitting: Emergency Medicine

## 2014-03-29 ENCOUNTER — Emergency Department (HOSPITAL_COMMUNITY)
Admission: EM | Admit: 2014-03-29 | Discharge: 2014-03-29 | Disposition: A | Discharge status: Home / Self Care | Payer: Self-pay | Attending: Emergency Medicine | Admitting: Emergency Medicine

## 2014-03-29 DIAGNOSIS — I10 Essential (primary) hypertension: Secondary | ICD-10-CM | POA: Insufficient documentation

## 2014-03-29 DIAGNOSIS — F99 Mental disorder, not otherwise specified: Secondary | ICD-10-CM | POA: Insufficient documentation

## 2014-03-29 DIAGNOSIS — F319 Bipolar disorder, unspecified: Secondary | ICD-10-CM | POA: Insufficient documentation

## 2014-03-29 DIAGNOSIS — Z79899 Other long term (current) drug therapy: Secondary | ICD-10-CM | POA: Insufficient documentation

## 2014-03-29 DIAGNOSIS — M79671 Pain in right foot: Secondary | ICD-10-CM | POA: Insufficient documentation

## 2014-03-29 DIAGNOSIS — Z720 Tobacco use: Secondary | ICD-10-CM | POA: Insufficient documentation

## 2014-03-29 DIAGNOSIS — J45909 Unspecified asthma, uncomplicated: Secondary | ICD-10-CM | POA: Insufficient documentation

## 2014-03-29 MED ORDER — HYDROCODONE-ACETAMINOPHEN 5-325 MG PO TABS: ORAL_TABLET | ORAL | Status: AC

## 2014-03-29 MED ORDER — MELOXICAM 15 MG PO TABS: 15.0000 mg | ORAL_TABLET | Freq: Every day | ORAL | Status: DC

## 2014-03-29 NOTE — Discharge Instructions (Signed)
Please read and follow all provided instructions.  Your diagnoses today include:  1. Foot arch pain, right    Tests performed today include:  Vital signs. See below for your results today.   Medications prescribed:   Vicodin (hydrocodone/acetaminophen) - narcotic pain medication  DO NOT drive or perform any activities that require you to be awake and alert because this medicine can make you drowsy. BE VERY CAREFUL not to take multiple medicines containing Tylenol (also called acetaminophen). Doing so can lead to an overdose which can damage your liver and cause liver failure and possibly death.   Meloxicam - anti-inflammatory pain medication  You have been prescribed an anti-inflammatory medication or NSAID. Take with food. Do not take aspirin, ibuprofen, or naproxen if taking this medication. Take smallest effective dose for the shortest duration needed for your pain. Stop taking if you experience stomach pain or vomiting.   Take any prescribed medications only as directed.  Home care instructions:   Follow any educational materials contained in this packet  Follow R.I.C.E. Protocol:  R - rest your injury   I  - use ice on injury without applying directly to skin  C - compress injury with bandage or splint  E - elevate the injury as much as possible  Follow-up instructions: Please follow-up with your primary care provider, or the provided orthopedic physician/podiatry (foot specialist) if you continue to have significant pain or trouble walking in 1 week. In this case you may have a severe injury that requires further care.   Return instructions:   Please return if your toes are numb or tingling, appear gray or blue, or you have severe pain (also elevate leg and loosen splint or wrap if you were given one)  Please return to the Emergency Department if you experience worsening symptoms.   Please return if you have any other emergent concerns.  Additional  Information:  Your vital signs today were: BP 136/91   Pulse 73   Temp(Src) 98.4 F (36.9 C) (Oral)   Resp 18   SpO2 98% If your blood pressure (BP) was elevated above 135/85 this visit, please have this repeated by your doctor within one month. --------------

## 2014-03-29 NOTE — ED Notes (Signed)
Pt c/o right foot pain x months that is worsened by walking

## 2014-03-29 NOTE — ED Provider Notes (Signed)
CSN: [id number]     Arrival date & time 03/29/14  1302 History  This chart was scribed for [name] CriglerJoshua Aziel Morgan, PA-C, working with Gwyneth SproutWhitney Plunkett, MD by Chestine SporeSoijett Blue, ED Scribe. The patient was seen in room TR06C/TR06C at 1:46 PM.    Chief Complaint  Patient presents with  . Foot Pain    The history is provided by the patient. No language interpreter was used.   HPI Comments: [name] [name] is a 33 y.o. male who presents to the Emergency Department complaining of right foot pain onset Feburary. He states that the pain is worsened by walking. He states that [place] He states that at the time, a knot came up, and now when he tries to work the area will swell. He states that when he walks on it the pain is worse.  He states that he is having associated symptoms of tingling in painful area. He states that he has tried Tylenol with no relief for his symptoms. He denies any other symptoms. He denies having a PCP.   Past Medical History  Diagnosis Date  . Mental disorder   . Hypertension   . Depression   . Bipolar 1 disorder   . Irregular heart rate   . Asthma    History reviewed. No pertinent past surgical history. Family History  Problem Relation Age of Onset  . Hypertension Mother   . Hypertension Other    History  Substance Use Topics  . Smoking status: Current Every Day Smoker -- 0.50 packs/day  . Smokeless tobacco: Never Used  . Alcohol Use: 0.0 oz/week     Comment: pt states he has been drinking 12 beers and 6 shots daily for several weeks    Review of Systems  Constitutional: Negative for activity change.  Musculoskeletal: Positive for arthralgias (right foot) and gait problem. Negative for back pain, joint swelling and neck pain.  Skin: Negative for wound.  Neurological: Negative for weakness and numbness.      Allergies  Review of patient's allergies indicates no known allergies.  Home Medications   Prior to Admission medications    Medication Sig Start Date End Date Taking? Authorizing Provider  aspirin-acetaminophen-caffeine (EXCEDRIN MIGRAINE) [phone] MG per tablet Take 3 tablets by mouth daily as needed for headache or migraine.    Historical Provider, MD  buPROPion (WELLBUTRIN XL) 150 MG 24 hr tablet Take 150 mg by mouth daily.    Historical Provider, MD  DULoxetine (CYMBALTA) 60 MG capsule Take 60 mg by mouth daily.    Historical Provider, MD  gabapentin (NEURONTIN) 300 MG capsule Take 300 mg by mouth 3 (three) times daily.    Historical Provider, MD  hydrOXYzine (ATARAX/VISTARIL) 25 MG tablet Take 25 mg by mouth every 6 (six) hours as needed for anxiety.    Historical Provider, MD  ondansetron (ZOFRAN ODT) 8 MG disintegrating tablet Take 1 tablet (8 mg total) by mouth every 8 (eight) hours as needed for nausea or vomiting. 03/08/14   Olivia Mackielga M Otter, MD  QUEtiapine (SEROQUEL) 300 MG tablet Take 300 mg by mouth at bedtime.    Historical Provider, MD  traZODone (DESYREL) 150 MG tablet Take 150 mg by mouth at bedtime.    Historical Provider, MD   BP 136/91  Pulse 73  Temp(Src) 98.4 F (36.9 C) (Oral)  Resp 18  SpO2 98%  Physical Exam  Nursing note and vitals reviewed. Constitutional: He is oriented to person, place, and time. He appears well-developed  and well-nourished. No distress.  HENT:  Head: Normocephalic and atraumatic.  Eyes: Conjunctivae and EOM are normal.  Neck: Normal range of motion. Neck supple. No tracheal deviation present.  Cardiovascular: Normal rate and normal pulses.   Pulmonary/Chest: Effort normal. No respiratory distress.  Musculoskeletal: Normal range of motion. He exhibits tenderness. He exhibits no edema.       Right knee: Normal.       Right ankle: Normal.       Right foot: He exhibits tenderness and swelling. He exhibits normal range of motion, no bony tenderness and normal capillary refill.  Neurological: He is alert and oriented to person, place, and time. No sensory deficit.   Motor, sensation, and vascular distal to the injury is fully intact.   Skin: Skin is warm and dry.  Swelling over the medial plantar aspect of the right foot.   Psychiatric: He has a normal mood and affect. His behavior is normal.    ED Course  Procedures (including critical care time) DIAGNOSTIC STUDIES: Oxygen Saturation is 98% on room air, normal by my interpretation.    COORDINATION OF CARE: 1:52 PM-Discussed treatment plan which includes Referral and F/U to Podiatrist and Orthopedic, Meloxicam with pt at bedside and pt agreed to plan. Also recommend supportive shoes.   Labs Review Labs Reviewed - No data to display  Imaging Review No results found.   EKG Interpretation None      Vital signs reviewed and are as follows: Filed Vitals:   03/29/14 1310  BP: 136/91  Pulse: 73  Temp: 98.4 F (36.9 C)  Resp: 18   Patient was counseled on RICE protocol and told to rest injury, use ice for no longer than 15 minutes every hour, compress the area, and elevate above the level of their heart as much as possible to reduce swelling. Questions answered. Patient verbalized understanding.    Patient counseled on use of narcotic pain medications. Counseled not to combine these medications with others containing tylenol. Urged not to drink alcohol, drive, or perform any other activities that requires focus while taking these medications. The patient verbalizes understanding and agrees with the plan.  NSAIDs prescribed -- pt states no liver or kidney issues.   Strongly encouraged ortho/podiatry follow-up for additional treatment recommendations.    MDM   Final diagnoses:  Foot arch pain, right   Patient with recurrent right foot arch pain, likely started with initial injury in February. Question partial plantar fascia tear. At this point, conservative management indicated. Foot is neurovascularly intact.  I personally performed the services described in this documentation, which  was scribed in my presence. The recorded information has been reviewed and is accurate.    [name] CriglerJoshua Dawsyn Ramsaran, PA-C 03/29/14 [phone]

## 2014-03-29 NOTE — ED Provider Notes (Signed)
Medical screening examination/treatment/procedure(s) were performed by non-physician practitioner and as supervising physician I was immediately available for consultation/collaboration.   EKG Interpretation None        Chanie Soucek, MD 03/29/14 1658 

## 2014-04-12 ENCOUNTER — Encounter (HOSPITAL_COMMUNITY): Payer: Self-pay | Admitting: Emergency Medicine

## 2014-04-12 ENCOUNTER — Emergency Department (HOSPITAL_COMMUNITY)
Admission: EM | Admit: 2014-04-12 | Discharge: 2014-04-12 | Disposition: A | Discharge status: Home / Self Care | Payer: Self-pay | Attending: Emergency Medicine | Admitting: Emergency Medicine

## 2014-04-12 DIAGNOSIS — Z8679 Personal history of other diseases of the circulatory system: Secondary | ICD-10-CM | POA: Insufficient documentation

## 2014-04-12 DIAGNOSIS — J45909 Unspecified asthma, uncomplicated: Secondary | ICD-10-CM | POA: Insufficient documentation

## 2014-04-12 DIAGNOSIS — Z791 Long term (current) use of non-steroidal anti-inflammatories (NSAID): Secondary | ICD-10-CM | POA: Insufficient documentation

## 2014-04-12 DIAGNOSIS — R112 Nausea with vomiting, unspecified: Secondary | ICD-10-CM | POA: Insufficient documentation

## 2014-04-12 DIAGNOSIS — I10 Essential (primary) hypertension: Secondary | ICD-10-CM | POA: Insufficient documentation

## 2014-04-12 DIAGNOSIS — Z79899 Other long term (current) drug therapy: Secondary | ICD-10-CM | POA: Insufficient documentation

## 2014-04-12 DIAGNOSIS — R197 Diarrhea, unspecified: Secondary | ICD-10-CM | POA: Insufficient documentation

## 2014-04-12 DIAGNOSIS — Z720 Tobacco use: Secondary | ICD-10-CM | POA: Insufficient documentation

## 2014-04-12 DIAGNOSIS — F319 Bipolar disorder, unspecified: Secondary | ICD-10-CM | POA: Insufficient documentation

## 2014-04-12 LAB — CBC WITH DIFFERENTIAL/PLATELET
Basophils Absolute: 0 10*3/uL (ref 0.0–0.1)
Basophils Relative: 0 % (ref 0–1)
EOS ABS: 0.1 10*3/uL (ref 0.0–0.7)
EOS PCT: 2 % (ref 0–5)
HCT: 41.5 % (ref 39.0–52.0)
HEMOGLOBIN: 14 g/dL (ref 13.0–17.0)
Lymphocytes Relative: 27 % (ref 12–46)
Lymphs Abs: 1.8 10*3/uL (ref 0.7–4.0)
MCH: 29.8 pg (ref 26.0–34.0)
MCHC: 33.7 g/dL (ref 30.0–36.0)
MCV: 88.3 fL (ref 78.0–100.0)
MONOS PCT: 13 % — AB (ref 3–12)
Monocytes Absolute: 0.9 10*3/uL (ref 0.1–1.0)
NEUTROS PCT: 58 % (ref 43–77)
Neutro Abs: 4 10*3/uL (ref 1.7–7.7)
Platelets: 273 10*3/uL (ref 150–400)
RBC: 4.7 MIL/uL (ref 4.22–5.81)
RDW: 12.9 % (ref 11.5–15.5)
WBC: 6.9 10*3/uL (ref 4.0–10.5)

## 2014-04-12 LAB — COMPREHENSIVE METABOLIC PANEL
ALBUMIN: 4 g/dL (ref 3.5–5.2)
ALT: 13 U/L (ref 0–53)
ANION GAP: 10 (ref 5–15)
AST: 14 U/L (ref 0–37)
Alkaline Phosphatase: 102 U/L (ref 39–117)
BUN: 12 mg/dL (ref 6–23)
CALCIUM: 9.6 mg/dL (ref 8.4–10.5)
CO2: 28 mEq/L (ref 19–32)
Chloride: 106 mEq/L (ref 96–112)
Creatinine, Ser: 0.95 mg/dL (ref 0.50–1.35)
GFR calc non Af Amer: >90 mL/min (ref >90)
GLUCOSE: 88 mg/dL (ref 70–99)
Potassium: 4.5 mEq/L (ref 3.7–5.3)
Sodium: 144 mEq/L (ref 137–147)
TOTAL PROTEIN: 6.9 g/dL (ref 6.0–8.3)
Total Bilirubin: 1.3 mg/dL — ABNORMAL HIGH (ref 0.3–1.2)

## 2014-04-12 LAB — LIPASE, BLOOD: LIPASE: 26 U/L (ref 11–59)

## 2014-04-12 MED ORDER — ONDANSETRON HCL 4 MG/2ML IJ SOLN
4.0000 mg | INTRAMUSCULAR | Status: AC
  Administered 2014-04-12: 4 mg via INTRAVENOUS
  Filled 2014-04-12: qty 2

## 2014-04-12 MED ORDER — SODIUM CHLORIDE 0.9 % IV BOLUS (SEPSIS)
1000.0000 mL | INTRAVENOUS | Status: AC
  Administered 2014-04-12: 1000 mL via INTRAVENOUS

## 2014-04-12 MED ORDER — PROMETHAZINE HCL 12.5 MG PO TABS: 12.5000 mg | ORAL_TABLET | Freq: Four times a day (QID) | ORAL | Status: DC | PRN

## 2014-04-12 NOTE — ED Provider Notes (Signed)
CSN: [id number]     Arrival date & time 04/12/14  4098 History   First MD Initiated Contact with Patient 04/12/14 [phone]     Chief Complaint  Patient presents with  . Nausea   [name] [name] is a 33 y.o. male presents to the ED complaining of 3 days of nausea, vomiting and diarrhea. He reports this started 3 days ago and has slightly improved since then. He denies abdominal pain. He reports he's had 4 episodes of vomiting of undigested food in the past 24 hours. He reports 2 episodes of loose stools in the past 24 hours. He reports food has made his vomiting worse. He reports feeling lightheaded on standing earlier today. He states he stays with his sister at home. He denies alcohol use past year. He reports taking all of his medications regularly as prescribed. He denies sick contacts or questionable food intake. He denies hematemesis, hematochezia, melena, abdominal pain, fevers, chills dysuria, hematuria. He denies depressed mood, suicidal or homicidal ideations.  (Consider location/radiation/quality/duration/timing/severity/associated sxs/prior Treatment) Patient is a 33 y.o. male presenting with vomiting. The history is provided by the patient.  Emesis Severity:  Moderate Duration:  3 days Timing:  Intermittent Number of daily episodes:  4 Quality:  Undigested food Progression:  Improving Chronicity:  New Recent urination:  Normal Context: not post-tussive and not self-induced   Relieved by:  Nothing Worsened by:  Liquids Ineffective treatments:  None tried Associated symptoms: diarrhea   Associated symptoms: no abdominal pain, no arthralgias, no chills, no cough, no fever, no headaches, no myalgias, no sore throat and no URI   Risk factors: no diabetes, no prior abdominal surgery, no sick contacts, no suspect food intake and no travel to endemic areas     Past Medical History  Diagnosis Date  . Mental disorder   . Hypertension   . Depression   . Bipolar 1 disorder   .  Irregular heart rate   . Asthma    History reviewed. No pertinent past surgical history. Family History  Problem Relation Age of Onset  . Hypertension Mother   . Hypertension Other    History  Substance Use Topics  . Smoking status: Current Every Day Smoker -- 0.50 packs/day  . Smokeless tobacco: Never Used  . Alcohol Use: 0.0 oz/week     Comment: pt states he has been drinking 12 beers and 6 shots daily for several weeks    Review of Systems  Constitutional: Negative for fever, chills and fatigue.  HENT: Negative for congestion, ear pain, mouth sores, postnasal drip, rhinorrhea, sneezing, sore throat and trouble swallowing.   Eyes: Negative for pain and visual disturbance.  Respiratory: Negative for cough, shortness of breath and wheezing.   Cardiovascular: Negative for chest pain, palpitations and leg swelling.  Gastrointestinal: Positive for nausea, vomiting and diarrhea. Negative for abdominal pain, constipation, blood in stool and abdominal distention.  Genitourinary: Negative for dysuria, urgency, flank pain and decreased urine volume.  Musculoskeletal: Negative for myalgias, back pain, arthralgias and neck pain.  Skin: Negative for rash.  Neurological: Positive for light-headedness. Negative for dizziness, syncope, weakness, numbness and headaches.  Psychiatric/Behavioral: Negative for suicidal ideas and dysphoric mood.      Allergies  Review of patient's allergies indicates no known allergies.  Home Medications   Prior to Admission medications   Medication Sig Start Date End Date Taking? Authorizing Provider  buPROPion (WELLBUTRIN XL) 150 MG 24 hr tablet Take 150 mg by mouth daily.   Yes Historical  Provider, MD  DULoxetine (CYMBALTA) 60 MG capsule Take 60 mg by mouth daily.   Yes Historical Provider, MD  gabapentin (NEURONTIN) 300 MG capsule Take 300 mg by mouth 3 (three) times daily.   Yes Historical Provider, MD  HYDROcodone-acetaminophen (NORCO/VICODIN) 5-325 MG  per tablet Take 1-2 tablets every 6 hours as needed for severe pain 03/29/14  Yes Renne CriglerJoshua Geiple, PA-C  hydrOXYzine (ATARAX/VISTARIL) 25 MG tablet Take 25 mg by mouth every 6 (six) hours as needed for anxiety.   Yes Historical Provider, MD  QUEtiapine (SEROQUEL) 300 MG tablet Take 300 mg by mouth at bedtime.   Yes Historical Provider, MD  traZODone (DESYREL) 150 MG tablet Take 150 mg by mouth at bedtime.   Yes Historical Provider, MD  aspirin-acetaminophen-caffeine (EXCEDRIN MIGRAINE) [phone] MG per tablet Take 3 tablets by mouth daily as needed for headache or migraine.    Historical Provider, MD  meloxicam (MOBIC) 15 MG tablet Take 1 tablet (15 mg total) by mouth daily. 03/29/14   Renne CriglerJoshua Geiple, PA-C  ondansetron (ZOFRAN ODT) 8 MG disintegrating tablet Take 1 tablet (8 mg total) by mouth every 8 (eight) hours as needed for nausea or vomiting. 03/08/14   Olivia Mackielga M Otter, MD  promethazine (PHENERGAN) 12.5 MG tablet Take 1 tablet (12.5 mg total) by mouth every 6 (six) hours as needed for nausea or vomiting. 04/12/14   Einar GipWilliam Duncan Dashaun Onstott, PA   BP 117/81 mmHg  Pulse 71  Temp(Src) 98 F (36.7 C)  Resp 14  Ht 6\' 1"  (1.854 m)  Wt 295 lb (133.811 kg)  BMI 38.93 kg/m2  SpO2 95% Physical Exam  Constitutional: He appears well-developed and well-nourished. No distress.  HENT:  Head: Normocephalic and atraumatic.  Right Ear: External ear normal.  Left Ear: External ear normal.  Mouth/Throat: Oropharynx is clear and moist. No oropharyngeal exudate.  Eyes: Conjunctivae are normal. Pupils are equal, round, and reactive to light. Right eye exhibits no discharge. Left eye exhibits no discharge.  Neck: Normal range of motion. Neck supple.  Cardiovascular: Normal rate, regular rhythm, normal heart sounds and intact distal pulses.  Exam reveals no gallop and no friction rub.   No murmur heard. Pulmonary/Chest: Effort normal and breath sounds normal. No respiratory distress. He has no wheezes. He has no  rales.  Abdominal: Soft. Bowel sounds are normal. He exhibits no distension and no mass. There is no tenderness. There is no rebound and no guarding.  Abdomen is nontender to palpation. No rebound tenderness. Negative obturator and psoas sign.  Musculoskeletal: He exhibits no edema or tenderness.  Lymphadenopathy:    He has no cervical adenopathy.  Neurological: He is alert. Coordination normal.  Skin: Skin is warm and dry. No rash noted. He is not diaphoretic. No erythema. No pallor.  Psychiatric: He has a normal mood and affect. His behavior is normal.  Nursing note and vitals reviewed.   ED Course  Procedures (including critical care time) Labs Review Labs Reviewed  CBC WITH DIFFERENTIAL - Abnormal; Notable for the following:    Monocytes Relative 13 (*)    All other components within normal limits  COMPREHENSIVE METABOLIC PANEL - Abnormal; Notable for the following:    Total Bilirubin 1.3 (*)    All other components within normal limits  LIPASE, BLOOD    Imaging Review No results found.   EKG Interpretation None      Filed Vitals:   04/12/14 0445 04/12/14 0455 04/12/14 0500 04/12/14 0559  BP:  116/79 117/81  Pulse:  80 79 71  Temp:  98 F (36.7 C)    Resp:  13 15 14   Height:  6\' 1"  (1.854 m)    Weight:  295 lb (133.811 kg)    SpO2: 98% 96% 98% 95%     MDM   Meds given in ED:  Medications  ondansetron (ZOFRAN) injection 4 mg (4 mg Intravenous Given 04/12/14 0638)  sodium chloride 0.9 % bolus 1,000 mL (1,000 mLs Intravenous New Bag/Given 04/12/14 0637)    New Prescriptions   PROMETHAZINE (PHENERGAN) 12.5 MG TABLET    Take 1 tablet (12.5 mg total) by mouth every 6 (six) hours as needed for nausea or vomiting.    Final diagnoses:  Nausea vomiting and diarrhea   [name] [name] presents to the ED with a three-day history of nausea, vomiting and diarrhea. He has no abdominal pain and is afebrile. He denies alcohol use in the past year. His lab work is  unremarkable. His nausea resolved with Zofran and fluid bolus. He was able to tolerate by mouth liquids prior to discharge. He had no vomiting or diarrhea during his stay in the ED. He denies depressed mood, suicidal or homicidal ideations. Will discharge patient with Phenergan for nausea. advised patient that this could cause drowsiness. Advised patient to follow-up with his primary care provider in 3 days if he does not have a primary care provider to see attached resources. Advised patient to return to the ED with new or worsening symptoms or new concerns. She verbalized understanding and agreement with plan. Patient discussed and evaluated with PA ChadWest who agrees with treatment and plan.      Lawana ChambersWilliam Duncan Abigayl Hor, PA 04/12/14 [phone]  Tomasita CrumbleAdeleke Oni, MD 04/12/14 1426

## 2014-04-12 NOTE — Discharge Instructions (Signed)
Nausea and Vomiting Nausea is a sick feeling that often comes before throwing up (vomiting). Vomiting is a reflex where stomach contents come out of your mouth. Vomiting can cause severe loss of body fluids (dehydration). Children and elderly adults can become dehydrated quickly, especially if they also have diarrhea. Nausea and vomiting are symptoms of a condition or disease. It is important to find the cause of your symptoms. CAUSES   Direct irritation of the stomach lining. This irritation can result from increased acid production (gastroesophageal reflux disease), infection, food poisoning, taking certain medicines (such as nonsteroidal anti-inflammatory drugs), alcohol use, or tobacco use.  Signals from the brain.These signals could be caused by a headache, heat exposure, an inner ear disturbance, increased pressure in the brain from injury, infection, a tumor, or a concussion, pain, emotional stimulus, or metabolic problems.  An obstruction in the gastrointestinal tract (bowel obstruction).  Illnesses such as diabetes, hepatitis, gallbladder problems, appendicitis, kidney problems, cancer, sepsis, atypical symptoms of a heart attack, or eating disorders.  Medical treatments such as chemotherapy and radiation.  Receiving medicine that makes you sleep (general anesthetic) during surgery. DIAGNOSIS Your caregiver may ask for tests to be done if the problems do not improve after a few days. Tests may also be done if symptoms are severe or if the reason for the nausea and vomiting is not clear. Tests may include:  Urine tests.  Blood tests.  Stool tests.  Cultures (to look for evidence of infection).  X-rays or other imaging studies. Test results can help your caregiver make decisions about treatment or the need for additional tests. TREATMENT You need to stay well hydrated. Drink frequently but in small amounts.You may wish to drink water, sports drinks, clear broth, or eat frozen  ice pops or gelatin dessert to help stay hydrated.When you eat, eating slowly may help prevent nausea.There are also some antinausea medicines that may help prevent nausea. HOME CARE INSTRUCTIONS   Take all medicine as directed by your caregiver.  If you do not have an appetite, do not force yourself to eat. However, you must continue to drink fluids.  If you have an appetite, eat a normal diet unless your caregiver tells you differently.  Eat a variety of complex carbohydrates (rice, wheat, potatoes, bread), lean meats, yogurt, fruits, and vegetables.  Avoid high-fat foods because they are more difficult to digest.  Drink enough water and fluids to keep your urine clear or pale yellow.  If you are dehydrated, ask your caregiver for specific rehydration instructions. Signs of dehydration may include:  Severe thirst.  Dry lips and mouth.  Dizziness.  Dark urine.  Decreasing urine frequency and amount.  Confusion.  Rapid breathing or pulse. SEEK IMMEDIATE MEDICAL CARE IF:   You have blood or brown flecks (like coffee grounds) in your vomit.  You have black or bloody stools.  You have a severe headache or stiff neck.  You are confused.  You have severe abdominal pain.  You have chest pain or trouble breathing.  You do not urinate at least once every 8 hours.  You develop cold or clammy skin.  You continue to vomit for longer than 24 to 48 hours.  You have a fever. MAKE SURE YOU:   Understand these instructions.  Will watch your condition.  Will get help right away if you are not doing well or get worse. Document Released: 05/22/2005 Document Revised: 08/14/2011 Document Reviewed: 10/19/2010 ExitCare Patient Information 2015 [employer]. This information is not intended   to replace advice given to you by your health care provider. Make sure you discuss any questions you have with your health care provider. Viral Gastroenteritis Viral gastroenteritis is  also known as stomach flu. This condition affects the stomach and intestinal tract. It can cause sudden diarrhea and vomiting. The illness typically lasts 3 to 8 days. Most people develop an immune response that eventually gets rid of the virus. While this natural response develops, the virus can make you quite ill. CAUSES  Many different viruses can cause gastroenteritis, such as rotavirus or noroviruses. You can catch one of these viruses by consuming contaminated food or water. You may also catch a virus by sharing utensils or other personal items with an infected person or by touching a contaminated surface. SYMPTOMS  The most common symptoms are diarrhea and vomiting. These problems can cause a severe loss of body fluids (dehydration) and a body salt (electrolyte) imbalance. Other symptoms may include:  Fever.  Headache.  Fatigue.  Abdominal pain. DIAGNOSIS  Your caregiver can usually diagnose viral gastroenteritis based on your symptoms and a physical exam. A stool sample may also be taken to test for the presence of viruses or other infections. TREATMENT  This illness typically goes away on its own. Treatments are aimed at rehydration. The most serious cases of viral gastroenteritis involve vomiting so severely that you are not able to keep fluids down. In these cases, fluids must be given through an intravenous line (IV). HOME CARE INSTRUCTIONS   Drink enough fluids to keep your urine clear or pale yellow. Drink small amounts of fluids frequently and increase the amounts as tolerated.  Ask your caregiver for specific rehydration instructions.  Avoid:  Foods high in sugar.  Alcohol.  Carbonated drinks.  Tobacco.  Juice.  Caffeine drinks.  Extremely hot or cold fluids.  Fatty, greasy foods.  Too much intake of anything at one time.  Dairy products until 24 to 48 hours after diarrhea stops.  You may consume probiotics. Probiotics are active cultures of beneficial  bacteria. They may lessen the amount and number of diarrheal stools in adults. Probiotics can be found in yogurt with active cultures and in supplements.  Wash your hands well to avoid spreading the virus.  Only take over-the-counter or prescription medicines for pain, discomfort, or fever as directed by your caregiver. Do not give aspirin to children. Antidiarrheal medicines are not recommended.  Ask your caregiver if you should continue to take your regular prescribed and over-the-counter medicines.  Keep all follow-up appointments as directed by your caregiver. SEEK IMMEDIATE MEDICAL CARE IF:   You are unable to keep fluids down.  You do not urinate at least once every 6 to 8 hours.  You develop shortness of breath.  You notice blood in your stool or vomit. This may look like coffee grounds.  You have abdominal pain that increases or is concentrated in one small area (localized).  You have persistent vomiting or diarrhea.  You have a fever.  The patient is a child younger than 3 months, and he or she has a fever.  The patient is a child older than 3 months, and he or she has a fever and persistent symptoms.  The patient is a child older than 3 months, and he or she has a fever and symptoms suddenly get worse.  The patient is a baby, and he or she has no tears when crying. MAKE SURE YOU:   Understand these instructions.  Will watch your  condition.  Will get help right away if you are not doing well or get worse. Document Released: 05/22/2005 Document Revised: 08/14/2011 Document Reviewed: 03/08/2011 [facility] Patient Information 2015 [place], [place]. This information is not intended to replace advice given to you by your health care provider. Make sure you discuss any questions you have with your health care provider.  Emergency Department Resource Guide 1) Find a Doctor and Pay Out of Pocket Although you won't have to find out who is covered by your insurance plan, it is  a good idea to ask around and get recommendations. You will then need to call the office and see if the doctor you have chosen will accept you as a new patient and what types of options they offer for patients who are self-pay. Some doctors offer discounts or will set up payment plans for their patients who do not have insurance, but you will need to ask so you aren't surprised when you get to your appointment.  2) Contact Your Local Health Department Not all health departments have doctors that can see patients for sick visits, but many do, so it is worth a call to see if yours does. If you don't know where your local health department is, you can check in your phone book. The CDC also has a tool to help you locate your state's health department, and many state websites also have listings of all of their local health departments.  3) Find a Walk-in Clinic If your illness is not likely to be very severe or complicated, you may want to try a walk in clinic. These are popping up all over the country in pharmacies, drugstores, and shopping centers. They're usually staffed by nurse practitioners or physician assistants that have been trained to treat common illnesses and complaints. They're usually fairly quick and inexpensive. However, if you have serious medical issues or chronic medical problems, these are probably not your best option.  No Primary Care Doctor: - Call Health Connect at  [phone] - they can help you locate a primary care doctor that  accepts your insurance, provides certain services, etc. - Physician Referral Service- [phone]  Chronic Pain Problems: Organization         Address  Phone   Notes  [name]  [phone] Patients need to be referred by their primary care doctor.   Medication Assistance: Organization         Address  Phone   Notes  [facility] Med[facility] [address] [place], [place] [address] [phone] --Must be a resident of [facility] -- Must have NO insurance coverage whatsoever (no Medicaid/ Medicare, etc.) -- The pt. MUST have a primary care doctor that directs their care regularly and follows them in the community   Med[facility]ist  [phone]   [employer]  [phone]    Agencies that provide inexpensive medical care: Organization         Address  Phone   Notes  [facility]  [phone]   [facility]    [phone]   [facility] [address] [place], [place] [address] [phone]   [facility] [address] [place] [place] [phone]   Planned Parenthood    [phone]   [facility]    [phone]   [facility]  [address] [place], [place] Phone:  [phone])  [phone], Fax:  [phone] Hours of Operation:  9 am - 6 pm, M-F.  Also accepts Medicaid/Medicare and self-pay.  [facility]  [address], [place] Phone: [phone], Fax: [phone]) [phone]. Hours of Operation:  8:30 am - 5:30 pm, M-F.  Also accepts Medicaid and self-pay.  [facility] [place] [address], [place] Phone: [phone](336) [phone]   [facility] [address], [place], [place] [phone], Ext. [phone] Mondays & Thursdays: 7-9 AM.  First 15 patients are seen on a first come, first serve basis.    Medicaid-accepting [facility] Providers:  Organization         Address  Phone   Notes  [facility] [address], [place] [phone]) [phone] Also accepts self-pay patients.  [facility] [address] [place], [place]  [phone](336) [phone]   [facility] [address], [place] [phone](336) [phone]   [facility] [address] [place] [address], [place] [phone](336) [phone]   Renaye RakersVeita Bland [address], [place]   [phone])  [phone] Only accepts [place] Access [place] patients after they have their name applied to their card.   Self-Pay (no insurance) in [facility] [place]:  Organization         Address  Phone   Notes  Sickle Cell Patients, [facility] [address], [place] [phone](336) [phone]   [facility] [address], [place] [phone]) [phone]   [facility] [place]  [address], [place] [phone]) [phone]   [facility]/Dr. Osei-Bonsu  [address] [place] [address], [place] or [phone], [place] [phone]) [phone] Phone number for both [place] and [place] locations is the same.  [facility] [address], [place] [phone](336) [phone]   [facility] [address] [place] [address], [place] or [address] [phone]) [phone]   [facility] [address], [place] [phone]) [phone], phone; [phone](336) [phone], fax Sees patients 1st and 3rd Saturday of every month.  Must not qualify for public or private insurance (i.e. Medicaid, Medicare, [place] Health Choice, Veterans' Benefits)  Household income should be no more than 200% of the poverty level The clinic cannot treat you if you are pregnant or think you are pregnant  Sexually transmitted diseases are not treated at the clinic.    Dental Care: Organization         Address  Phone  Notes  [facility] Department of [facility] [address], [place] [phone]) [phone] Accepts children up to age 33 who are enrolled in [place] or [place] Health Choice; pregnant women with a Medicaid card; and children who have applied for Medicaid or [place] Health Choice, but were declined, whose parents can pay a reduced fee at time of service.  [facility] [place]  [address], [place] [phone]) [phone] Accepts children up to age 33 who are enrolled in [place] or [place] Health  Choice; pregnant women with a Medicaid card; and children who have applied for Medicaid or [place] Health Choice, but were declined, whose parents can pay a reduced fee at time of service.  [facility]  [address], [place] [phone]) [phone] Patients are seen by appointment only. Walk-ins are  not accepted. [facility] will see patients 67 years of age and older. Monday - Tuesday (8am-5pm) Most Wednesdays (8:30-5pm) $30 per visit, cash only  [facility]  [address], [facility] [phone] Patients are seen by appointment only. Walk-ins are not accepted. [facility] will see patients 35 years of age and older. One Wednesday Evening (Monthly: Volunteer Based).  $30 per visit, cash only  [employer]  [phone] for adults; Children under age 42, call Graduate Pediatric Dentistry at [phone]. Children aged 1-14, please call [phone] to request a pediatric application.  Dental services are provided in all areas of dental care including fillings, crowns and bridges, complete and partial dentures, implants, gum treatment, root canals, and extractions. Preventive care is also provided. Treatment is provided to both adults and children. Patients are selected via a lottery and there is often a waiting list.   [facility] [place] [address], [place]  [phone] [email or web address]   [facility] [address] [place], [place] [phone], Ext. [phone] Second and Fourth Thursday of each month, opens at 6:30 AM; Clinic ends at 9 AM.  Patients are seen on a first-come first-served basis, and a limited number are seen during each clinic.   [facility]  [address] [place], [place] [phone]   Eligibility Requirements You must have lived in [place], [place], or [place] counties for at least the last three months.   You cannot be eligible for state or  federal sponsored [place], including [employer], [place], or [employer].   You generally cannot be eligible for healthcare insurance through your employer.    How to apply: Eligibility screenings are held every Tuesday and Wednesday afternoon from 1:00 pm until 4:00 pm. You do not need an appointment for the interview!  [facility] [address], [place], [place] [phone]   [facility]  [phone]   [facility]  [phone]   [facility]  [phone]    Behavioral Health Resources in the Community: Intensive Outpatient Programs Organization         Address  Phone  Notes  [facility] [address] [place], [place] [phone]   [facility] [address], [place], [place] [phone]   [facility] [address], [place], [place]  [phone]   [facility] Mental Health [address],  [place], [place] [phone] or [phone]   Substance Abuse Resources Organization         Address  Phone  Notes  [facility]  [phone]   [facility]  [phone]   [facility] [place]  [phone]   Floydene Flock  [phone]   Residential & Outpatient Substance Abuse Program  [phone]   Psychological Services Organization         Address  Phone  Notes  [facility]al Health  [phone]   [facility] Services  [phone]   [facility] Mental Health [address], [place] [phone] or [phone]    Mobile Crisis Teams Organization         Address  Phone  Notes  [facility], Mobile Crisis Care Unit  [phone]   [facility]  [address] [place], [place] [phone]   [facility] [address] [place] [phone]    Self-Help/Support Groups  Organization         Address  Phone              Notes  [facility] [place] - variety of support groups  [phone]- [phone] Call for more information  Narcotics Anonymous (NA), [facility] [address], [place]  2 meetings at this location   Statisticianesidential Treatment Programs Organization         Address  Phone  Notes  [facility] [address],    [place]  [phone]   [facility]  [address], [place] [phone], [place], [place] [phone]   [facility] [address], [place] [phone] Admissions: 8am-3pm M-F  [facility] [address]    [place], [place] [phone]   [facility] [address], [place], [place] [phone]   [facility] [address]  [place], [place] [phone]   [facility] - Intensive Outpatient [address], [place], [place] [phone]   [facility] ([facility] [address]  [place], [place] [phone] or [phone]   [facility] (RT[facility]) [address] [place], [place] [phone] Accepts Medicaid  [facility] [address]  [place] [phone] Substance Abuse/Addiction Treatment   [facility] Behavioral Health Resources Organization         Address  Phone  Notes  [facility]  [phone]) [phone]   Angie FavaJulie Brannon, PhD [address], [address] [place], [place]   [phone]) [phone] or [phone]) [phone]   [facility] [place]   [address] [place], [place] [phone](336) [phone]   [facility] [address], [place], [place] [phone](336) [phone] Insurance/Medicaid/sponsorship through [facility]  [facility] [address]                                    [place], [place] [phone](336) [phone] Therapy/tele-psych/case  [facility] [address]   [place], [place] [phone](336) [phone]    Dr. Lolly MustacheArfeen  [phone](336) [phone]   [facility]  [employer] [facility]  Dept. 1) [address], [place] 2) [address], [place] 3)  [address], [place] [phone](336) [phone]) [phone]  [phone](336) [phone]   [facility] Child Abuse Hotline [phone](336) [phone] or [phone](336) [phone] (After Hours)

## 2014-04-12 NOTE — ED Notes (Signed)
Patient here with complaint of nausea which started about 3 days ago. EMS reports that initial call was for patient who couldn't stand do to dizziness. EMS reports that patient quickly stood up and got into ambulance without assistance, was not orthostatic, had normal blood glucose, did not vomit, and had a vague story en route to hospital. Patient is reportedly homeless and was picked up from bus stop this AM. Patient ambulatory from ambulance to treatment room. NAD, steady gait.

## 2014-04-27 ENCOUNTER — Encounter (HOSPITAL_COMMUNITY): Payer: Self-pay | Admitting: Emergency Medicine

## 2014-04-27 ENCOUNTER — Emergency Department (HOSPITAL_COMMUNITY)
Admission: EM | Admit: 2014-04-27 | Discharge: 2014-04-27 | Disposition: A | Discharge status: Home / Self Care | Payer: Self-pay | Attending: Emergency Medicine | Admitting: Emergency Medicine

## 2014-04-27 DIAGNOSIS — I10 Essential (primary) hypertension: Secondary | ICD-10-CM | POA: Insufficient documentation

## 2014-04-27 DIAGNOSIS — F319 Bipolar disorder, unspecified: Secondary | ICD-10-CM | POA: Insufficient documentation

## 2014-04-27 DIAGNOSIS — J45909 Unspecified asthma, uncomplicated: Secondary | ICD-10-CM | POA: Insufficient documentation

## 2014-04-27 DIAGNOSIS — Z79899 Other long term (current) drug therapy: Secondary | ICD-10-CM | POA: Insufficient documentation

## 2014-04-27 DIAGNOSIS — H6122 Impacted cerumen, left ear: Secondary | ICD-10-CM | POA: Insufficient documentation

## 2014-04-27 DIAGNOSIS — Y9289 Other specified places as the place of occurrence of the external cause: Secondary | ICD-10-CM | POA: Insufficient documentation

## 2014-04-27 DIAGNOSIS — Y998 Other external cause status: Secondary | ICD-10-CM | POA: Insufficient documentation

## 2014-04-27 DIAGNOSIS — T162XXA Foreign body in left ear, initial encounter: Secondary | ICD-10-CM | POA: Insufficient documentation

## 2014-04-27 DIAGNOSIS — Z791 Long term (current) use of non-steroidal anti-inflammatories (NSAID): Secondary | ICD-10-CM | POA: Insufficient documentation

## 2014-04-27 DIAGNOSIS — Y9389 Activity, other specified: Secondary | ICD-10-CM | POA: Insufficient documentation

## 2014-04-27 DIAGNOSIS — X58XXXA Exposure to other specified factors, initial encounter: Secondary | ICD-10-CM | POA: Insufficient documentation

## 2014-04-27 MED ORDER — ACETAMINOPHEN 325 MG PO TABS
650.0000 mg | ORAL_TABLET | Freq: Once | ORAL | Status: AC
  Administered 2014-04-27: 650 mg via ORAL
  Filled 2014-04-27: qty 2

## 2014-04-27 NOTE — ED Notes (Signed)
Flushed left ear with 20mL of normal saline. Removed tiny ear wax and a tiny white piece. Patient tolerated procedure well. Pt states it has decreased the earache.

## 2014-04-27 NOTE — ED Provider Notes (Signed)
CSN: [id number]     Arrival date & time 04/27/14  0139 History   First MD Initiated Contact with Patient 04/27/14 0203     Chief Complaint  Patient presents with  . Otalgia     (Consider location/radiation/quality/duration/timing/severity/associated sxs/prior Treatment) HPI Patient concerned that he lost the tip of a Q-tip in his left ear 3 days ago. 2 days ago states his girlfriend was trying to remove the cotton piece with a bobby pin. Patient began having acute onset left ear pain with small amount of bleeding. Patient states his hearing has not changed. Denies fever or chills. Past Medical History  Diagnosis Date  . Mental disorder   . Hypertension   . Depression   . Bipolar 1 disorder   . Irregular heart rate   . Asthma    History reviewed. No pertinent past surgical history. Family History  Problem Relation Age of Onset  . Hypertension Mother   . Hypertension Other    History  Substance Use Topics  . Smoking status: Never Smoker   . Smokeless tobacco: Never Used  . Alcohol Use: No    Review of Systems  Constitutional: Negative for fever and chills.  HENT: Positive for ear pain. Negative for ear discharge.   All other systems reviewed and are negative.     Allergies  Review of patient's allergies indicates no known allergies.  Home Medications   Prior to Admission medications   Medication Sig Start Date End Date Taking? Authorizing Provider  aspirin-acetaminophen-caffeine (EXCEDRIN MIGRAINE) [phone] MG per tablet Take 3 tablets by mouth daily as needed for headache or migraine.   Yes Historical Provider, MD  buPROPion (WELLBUTRIN XL) 150 MG 24 hr tablet Take 150 mg by mouth daily.   Yes Historical Provider, MD  DULoxetine (CYMBALTA) 60 MG capsule Take 60 mg by mouth daily.   Yes Historical Provider, MD  gabapentin (NEURONTIN) 300 MG capsule Take 300 mg by mouth 3 (three) times daily.   Yes Historical Provider, MD  hydrOXYzine (ATARAX/VISTARIL) 25 MG  tablet Take 25 mg by mouth every 6 (six) hours as needed for anxiety.   Yes Historical Provider, MD  meloxicam (MOBIC) 15 MG tablet Take 1 tablet (15 mg total) by mouth daily. 03/29/14  Yes Renne CriglerJoshua Geiple, PA-C  QUEtiapine (SEROQUEL) 300 MG tablet Take 300 mg by mouth at bedtime.   Yes Historical Provider, MD  traZODone (DESYREL) 150 MG tablet Take 150 mg by mouth at bedtime.   Yes Historical Provider, MD  HYDROcodone-acetaminophen (NORCO/VICODIN) 5-325 MG per tablet Take 1-2 tablets every 6 hours as needed for severe pain Patient taking differently: Take 1-2 tablets by mouth every 6 (six) hours as needed for severe pain.  03/29/14   Renne CriglerJoshua Geiple, PA-C  ondansetron (ZOFRAN ODT) 8 MG disintegrating tablet Take 1 tablet (8 mg total) by mouth every 8 (eight) hours as needed for nausea or vomiting. 03/08/14   Olivia Mackielga M Otter, MD  promethazine (PHENERGAN) 12.5 MG tablet Take 1 tablet (12.5 mg total) by mouth every 6 (six) hours as needed for nausea or vomiting. 04/12/14   Einar GipWilliam Duncan Dansie, PA-C   BP 118/71 mmHg  Pulse 67  Temp(Src) 97.7 F (36.5 C) (Oral)  Resp 18  Wt 295 lb (133.811 kg)  SpO2 100% Physical Exam  Constitutional: He is oriented to person, place, and time. He appears well-developed and well-nourished. No distress.  HENT:  Head: Normocephalic and atraumatic.  Left external auditory canal with cerumen and questionable small piece of cotton.  There is mild erythema to the TM but the TM is intact. Hearing is symmetric  Neck: Normal range of motion. Neck supple.  Cardiovascular: Normal rate and regular rhythm.   Pulmonary/Chest: Effort normal.  Abdominal: Soft.  Musculoskeletal: Normal range of motion. He exhibits no edema or tenderness.  Neurological: He is alert and oriented to person, place, and time.  Skin: Skin is warm and dry. No erythema.  Psychiatric: He has a normal mood and affect. His behavior is normal.  Nursing note and vitals reviewed.   ED Course  Procedures  (including critical care time) Labs Review Labs Reviewed - No data to display  Imaging Review No results found.   EKG Interpretation None      MDM   Final diagnoses:  None    We'll flush the left ear. Patient is advised not to insert for bodies into the external auditory canal.  Your flush with removal of small piece of cotton. Will discharge home. Return precautions given.  Loren Raceravid Drew Herman, MD 04/27/14 [phone]

## 2014-04-27 NOTE — ED Notes (Signed)
Bed: [facility]PT1 Expected date:  Expected time:  Means of arrival:  Comments: EMS ear pain - triage

## 2014-04-27 NOTE — Discharge Instructions (Signed)
Do not place anything in your ear including Q-tips or bobby pins. You may take Tylenol or ibuprofen for pain     Ear Foreign Body An ear foreign body is an object that is stuck in the ear. It is common for young children to put objects into the ear canal. These may include pebbles, beads, beans, and any other small objects which will fit. In adults, objects such as cotton swabs may become lodged in the ear canal. In all ages, the most common foreign bodies are insects that enter the ear canal.  SYMPTOMS  Foreign bodies may cause pain, buzzing or roaring sounds, hearing loss, and ear drainage.  HOME CARE INSTRUCTIONS   Keep all follow-up appointments with your caregiver as told.  Keep small objects out of reach of young children. Tell them not to put anything in their ears. SEEK IMMEDIATE MEDICAL CARE IF:   You have bleeding from the ear.  You have increased pain or swelling of the ear.  You have reduced hearing.  You have discharge coming from the ear.  You have a fever.  You have a headache. MAKE SURE YOU:   Understand these instructions.  Will watch your condition.  Will get help right away if you are not doing well or get worse. Document Released: 05/19/2000 Document Revised: 08/14/2011 Document Reviewed: 01/08/2008 [facility] Patient Information 2015 [place], [place]. This information is not intended to replace advice given to you by your health care provider. Make sure you discuss any questions you have with your health care provider.

## 2014-04-27 NOTE — ED Notes (Signed)
Pt presents by EMS from [facility] after c/o L ear pain. Pt states he lost tip of qtip in ear canal a few days ago, girlfriend attempted to remove tip with bobby pin causing pain and some bleeding. Pt now c/o ear pain and HA

## 2014-04-28 ENCOUNTER — Encounter (HOSPITAL_COMMUNITY): Payer: Self-pay

## 2014-04-28 ENCOUNTER — Emergency Department (HOSPITAL_COMMUNITY)
Admission: EM | Admit: 2014-04-28 | Discharge: 2014-04-29 | Disposition: A | Discharge status: Other Acute Inpatient Hospital | Payer: Self-pay | Attending: Emergency Medicine | Admitting: Emergency Medicine

## 2014-04-28 DIAGNOSIS — Z7982 Long term (current) use of aspirin: Secondary | ICD-10-CM | POA: Insufficient documentation

## 2014-04-28 DIAGNOSIS — R45851 Suicidal ideations: Secondary | ICD-10-CM | POA: Insufficient documentation

## 2014-04-28 DIAGNOSIS — J45909 Unspecified asthma, uncomplicated: Secondary | ICD-10-CM | POA: Insufficient documentation

## 2014-04-28 DIAGNOSIS — I10 Essential (primary) hypertension: Secondary | ICD-10-CM | POA: Insufficient documentation

## 2014-04-28 DIAGNOSIS — Z79899 Other long term (current) drug therapy: Secondary | ICD-10-CM | POA: Insufficient documentation

## 2014-04-28 LAB — CBC WITH DIFFERENTIAL/PLATELET
BASOS PCT: 0 % (ref 0–1)
Basophils Absolute: 0 10*3/uL (ref 0.0–0.1)
Eosinophils Absolute: 0.1 10*3/uL (ref 0.0–0.7)
Eosinophils Relative: 2 % (ref 0–5)
HEMATOCRIT: 40.7 % (ref 39.0–52.0)
HEMOGLOBIN: 13.4 g/dL (ref 13.0–17.0)
Lymphocytes Relative: 25 % (ref 12–46)
Lymphs Abs: 1.8 10*3/uL (ref 0.7–4.0)
MCH: 29.3 pg (ref 26.0–34.0)
MCHC: 32.9 g/dL (ref 30.0–36.0)
MCV: 89.1 fL (ref 78.0–100.0)
MONO ABS: 0.8 10*3/uL (ref 0.1–1.0)
Monocytes Relative: 11 % (ref 3–12)
NEUTROS ABS: 4.6 10*3/uL (ref 1.7–7.7)
Neutrophils Relative %: 62 % (ref 43–77)
Platelets: 270 10*3/uL (ref 150–400)
RBC: 4.57 MIL/uL (ref 4.22–5.81)
RDW: 13.1 % (ref 11.5–15.5)
WBC: 7.4 10*3/uL (ref 4.0–10.5)

## 2014-04-28 LAB — COMPREHENSIVE METABOLIC PANEL
ALBUMIN: 3.9 g/dL (ref 3.5–5.2)
ALT: 15 U/L (ref 0–53)
ANION GAP: 15 (ref 5–15)
AST: 16 U/L (ref 0–37)
Alkaline Phosphatase: 108 U/L (ref 39–117)
BILIRUBIN TOTAL: 1 mg/dL (ref 0.3–1.2)
BUN: 14 mg/dL (ref 6–23)
CALCIUM: 9 mg/dL (ref 8.4–10.5)
CO2: 23 mEq/L (ref 19–32)
CREATININE: 0.93 mg/dL (ref 0.50–1.35)
Chloride: 105 mEq/L (ref 96–112)
GFR calc Af Amer: >90 mL/min (ref >90)
GFR calc non Af Amer: >90 mL/min (ref >90)
Glucose, Bld: 89 mg/dL (ref 70–99)
Potassium: 3.9 mEq/L (ref 3.7–5.3)
Sodium: 143 mEq/L (ref 137–147)
Total Protein: 6.7 g/dL (ref 6.0–8.3)

## 2014-04-28 LAB — RAPID URINE DRUG SCREEN, HOSP PERFORMED
AMPHETAMINES: NOT DETECTED
Barbiturates: POSITIVE — AB
Benzodiazepines: NOT DETECTED
COCAINE: NOT DETECTED
OPIATES: NOT DETECTED
TETRAHYDROCANNABINOL: NOT DETECTED

## 2014-04-28 LAB — ETHANOL: Alcohol, Ethyl (B): <11 mg/dL (ref 0–11)

## 2014-04-28 NOTE — ED Notes (Signed)
Pt. Expresses that he has been having thoughts of hurting himself.  When asked pt.if he has a plan he states. "NO, not yet."  Pt. Reports attempting to hurt himself in the past.  Pt. Has a lt. Earache.  Pt. Is stable.  Pt. Has not been able to eat or sleep due to his thoughts.  Pt. Does hear voices.

## 2014-04-28 NOTE — ED Notes (Signed)
SITTER AT BEDSIDE

## 2014-04-28 NOTE — ED Notes (Signed)
TELEPSYCH IN PROGRESS 

## 2014-04-28 NOTE — ED Notes (Signed)
All pt's belongings send with [facility] transportation staff.

## 2014-04-28 NOTE — ED Notes (Signed)
SITTER AT BEDSIDE SI CONTINUES

## 2014-04-28 NOTE — BH Assessment (Signed)
Tele Assessment Note   [name] [name] is a 33 y.o. male who voluntarily presents to [facility] with SI thoughts, depression and anxiety.  Pt reports that he's been SI x 72 hrs w/nospecific plan to harm himself.  Pt states his current mental state was triggered by: (1) mother died 3 yrs ago; (2) unemployment; (3) financial and (4) feeling like a burden to his sister, because he lives with her. Pt says he hasn't slept in 3 days and cannot eat.  Pt asys he is hearing his mother's voice--laughing and talking.  Pt says he is not productive and thinks people are making fun of him and talking about him behind his back--"I wish I was with my mom".  Pt denies HI/SA.  Pt endorses depressive sxs: crying spells, isolation, and anhedonia.  Pt has been accepted to [facility] for treatment by Donell Sievert, PA; [phone].   Axis I: Major depressive disorder, Recurrent episode, Severe;  Anxiety  D/O NOS  Axis II: Deferred Axis III:  Past Medical History  Diagnosis Date  . Mental disorder   . Hypertension   . Depression   . Bipolar 1 disorder   . Irregular heart rate   . Asthma    Axis IV: economic problems, occupational problems, other psychosocial or environmental problems, problems related to social environment and problems with primary support group Axis V: 31-40 impairment in reality testing  Past Medical History:  Past Medical History  Diagnosis Date  . Mental disorder   . Hypertension   . Depression   . Bipolar 1 disorder   . Irregular heart rate   . Asthma     History reviewed. No pertinent past surgical history.  Family History:  Family History  Problem Relation Age of Onset  . Hypertension Mother   . Hypertension Other     Social History:  reports that he has never smoked. He has never used smokeless tobacco. He reports that he does not drink alcohol or use illicit drugs.  Additional Social History:  Alcohol / Drug Use Pain Medications: See MAR  Prescriptions: See MAR  Over the Counter: See  MAR  History of alcohol / drug use?: No history of alcohol / drug abuse Longest period of sobriety (when/how long): None   CIWA: CIWA-Ar BP: 132/76 mmHg Pulse Rate: 102 COWS:    PATIENT STRENGTHS: (choose at least two) Communication skills Motivation for treatment/growth Supportive family/friends  Allergies: No Known Allergies  Home Medications:  (Not in a hospital admission)  OB/GYN Status:  No LMP for male patient.  General Assessment Data Location of Assessment: [facility] ED Is this a Tele or Face-to-Face Assessment?: Tele Assessment Is this an Initial Assessment or a Re-assessment for this encounter?: Initial Assessment Living Arrangements: Other relatives (Lives with sister ) Can pt return to current living arrangement?: Yes Admission Status: Voluntary Is patient capable of signing voluntary admission?: Yes Transfer from: Home Referral Source: Self/Family/Friend  Medical Screening Exam [facility] Walk-in ONLY) Medical Exam completed: No Reason for MSE not completed: Other: (None )  [facility] Crisis Care Plan Living Arrangements: Other relatives (Lives with sister ) Name of Psychiatrist: Vesta Mixer  Name of Therapist: [facility]   Education Status Is patient currently in school?: No Current Grade: None  Highest grade of school patient has completed: None  Name of school: None  Contact person: None   Risk to self with the past 6 months Suicidal Ideation: Yes-Currently Present Suicidal Intent: No-Not Currently/Within Last 6 Months Is patient at risk for suicide?: Yes  Suicidal Plan?: No Access to Means: Yes Specify Access to Suicidal Means: Sharps, Pills  What has been your use of drugs/alcohol within the last 12 months?: Denies  Previous Attempts/Gestures: Yes How many times?:  (Gestures Only ) Other Self Harm Risks: None  Triggers for Past Attempts: Unpredictable Intentional Self Injurious Behavior: None Family Suicide History: No Recent stressful life event(s): Loss  (Comment), Financial Problems (Mom died 3 yrs ago; financial, employment ) Persecutory voices/beliefs?: No Depression: Yes Depression Symptoms: Isolating, Insomnia, Tearfulness, Loss of interest in usual pleasures, Feeling worthless/self pity Substance abuse history and/or treatment for substance abuse?: No Suicide prevention information given to non-admitted patients: Not applicable  Risk to Others within the past 6 months Homicidal Ideation: No Thoughts of Harm to Others: No Current Homicidal Intent: No Current Homicidal Plan: No Access to Homicidal Means: No Identified Victim: None  History of harm to others?: No Assessment of Violence: None Noted Violent Behavior Description: None  Does patient have access to weapons?: No Criminal Charges Pending?: No Does patient have a court date: No  Psychosis Hallucinations: Auditory Delusions: None noted  Mental Status Report Appear/Hygiene: In scrubs Eye Contact: Good Motor Activity: Unremarkable Speech: Logical/coherent, Soft Level of Consciousness: Alert, Quiet/awake Mood: Depressed, Sad Affect: Depressed, Sad Anxiety Level: Minimal Thought Processes: Coherent, Relevant Judgement: Impaired Orientation: Person, Place, Time, Situation Obsessive Compulsive Thoughts/Behaviors: None  Cognitive Functioning Concentration: Normal Memory: Recent Intact, Remote Intact IQ: Average Insight: Poor Impulse Control: Fair Appetite: Poor Weight Loss: 0 Weight Gain: 0 Sleep: Decreased Total Hours of Sleep:  (No sleep x3 days ) Vegetative Symptoms: None  [facility] Patient's cognitive ability adequate to safely complete daily activities?: Yes Patient able to express need for assistance with ADLs?: Yes Independently performs ADLs?: Yes (appropriate for developmental age)  Prior Inpatient Therapy Prior Inpatient Therapy: Yes Prior Therapy Dates: 2012,2013,2014,2015 Prior Therapy Facilty/Provider(s): [facility], [facility]  Reason for Treatment: Depression/SI   Prior Outpatient Therapy Prior Outpatient Therapy: Yes Prior Therapy Dates: Current  Prior Therapy Facilty/Provider(s): [facility]  Reason for Treatment: Med Mgt   ADL Screening (condition at time of admission) Patient's cognitive ability adequate to safely complete daily activities?: Yes Is the patient deaf or have difficulty hearing?: No Does the patient have difficulty seeing, even when wearing glasses/contacts?: No Does the patient have difficulty concentrating, remembering, or making decisions?: Yes Patient able to express need for assistance with ADLs?: Yes Does the patient have difficulty dressing or bathing?: No Independently performs ADLs?: Yes (appropriate for developmental age) Does the patient have difficulty walking or climbing stairs?: No Weakness of Legs: None Weakness of Arms/Hands: None  Home Assistive Devices/Equipment Home Assistive Devices/Equipment: None  Therapy Consults (therapy consults require a physician order) PT Evaluation Needed: No OT Evalulation Needed: No SLP Evaluation Needed: No Abuse/Neglect Assessment (Assessment to be complete while patient is alone) Physical Abuse: Denies Verbal Abuse: Denies Sexual Abuse: Denies Exploitation of patient/patient's resources: Denies Self-Neglect: Denies Values / Beliefs Cultural Requests During Hospitalization: None Spiritual Requests During Hospitalization: None Consults Spiritual Care Consult Needed: No Social Work Consult Needed: No Merchant navy officerAdvance Directives (For Healthcare) Does patient have an advance directive?: No Would patient like information on creating an advanced directive?: No - patient declined information Nutrition Screen- [facility] Adult/[facility] Patient's home diet: Regular  Additional Information 1:1 In Past 12 Months?: No CIRT Risk: No Elopement Risk: No Does patient have medical clearance?: Yes     Disposition:  Disposition Initial  Assessment Completed for this Encounter: Yes Disposition of  Patient: Referred to, Inpatient treatment program (Accepted by Donell SievertSpencer Simon, PA ) Type of inpatient treatment program: Adult Patient referred to: Other (Comment) Donell Sievert([place] )  Murrell ReddenSimmons, Keela Rubert C 04/28/2014 10:27 PM

## 2014-04-28 NOTE — ED Provider Notes (Signed)
CSN: [id number]     Arrival date & time 04/28/14  1850 History   First MD Initiated Contact with Patient 04/28/14 1959     Chief Complaint  Patient presents with  . Suicidal   [name] [name] is a 33 y.o. male with history of depression, bipolar disorder, hypertension, and anxiety and is a patient of Vesta Mixer presents the ED complaining of suicidal ideations and depressed mood. Patient reports he's been feeling more depressed recently over the past week and a half. He reports that the anniversary of his mother's death is this 11-Nov-2022. He is complaining of suicidal ideations and "bad thoughts are closing in." Patient denies a plan to harm himself. The patient denies homicidal ideations. Patient does report auditory hallucinations of his mother speaking to him. He denies command hallucinations. He reports he's been sleeping but only for a couple hours at a time. He reports decreased appetite last ate yesterday. He denies visual hallucinations. Denies thought insertion, thought withdrawal, or thought broadcasting. Patient does report he was admitted for suicidal ideations at [facility] and was discharged on 02/22/2014. The patient is willing to be admitted. Patient reports he's been taking his medicines as prescribed. The patient denies alcohol or drug use. Patient denies fevers, chills, abdominal pain, nausea, vomiting, diarrhea, chest pain or shortness of breath.  (Consider location/radiation/quality/duration/timing/severity/associated sxs/prior Treatment) The history is provided by the patient.    Past Medical History  Diagnosis Date  . Mental disorder   . Hypertension   . Depression   . Bipolar 1 disorder   . Irregular heart rate   . Asthma    History reviewed. No pertinent past surgical history. Family History  Problem Relation Age of Onset  . Hypertension Mother   . Hypertension Other    History  Substance Use Topics  . Smoking status: Never Smoker   . Smokeless  tobacco: Never Used  . Alcohol Use: No    Review of Systems  Constitutional: Positive for appetite change. Negative for fever and chills.  HENT: Positive for ear pain. Negative for congestion, ear discharge, hearing loss, rhinorrhea, sinus pressure, sneezing, sore throat and trouble swallowing.   Eyes: Negative for pain, redness and visual disturbance.  Respiratory: Negative for cough, shortness of breath and wheezing.   Cardiovascular: Negative for chest pain and palpitations.  Gastrointestinal: Negative for nausea, vomiting, abdominal pain and diarrhea.  Genitourinary: Negative for dysuria, hematuria, decreased urine volume and difficulty urinating.  Musculoskeletal: Negative for back pain and neck pain.  Skin: Negative for rash.  Neurological: Negative for headaches.  Psychiatric/Behavioral: Positive for suicidal ideas, hallucinations, sleep disturbance and dysphoric mood. Negative for confusion and self-injury. The patient is nervous/anxious.   All other systems reviewed and are negative.     Allergies  Review of patient's allergies indicates no known allergies.  Home Medications   Prior to Admission medications   Medication Sig Start Date End Date Taking? Authorizing Provider  albuterol (PROVENTIL HFA;VENTOLIN HFA) 108 (90 BASE) MCG/ACT inhaler Inhale 2 puffs into the lungs every 4 (four) hours as needed for wheezing or shortness of breath.   Yes Historical Provider, MD  aspirin-acetaminophen-caffeine (EXCEDRIN MIGRAINE) [phone] MG per tablet Take 3 tablets by mouth daily as needed for headache or migraine.   Yes Historical Provider, MD  buPROPion (WELLBUTRIN XL) 150 MG 24 hr tablet Take 150 mg by mouth daily.   Yes Historical Provider, MD  DULoxetine (CYMBALTA) 60 MG capsule Take 60 mg by mouth daily.   Yes Historical  Provider, MD  gabapentin (NEURONTIN) 300 MG capsule Take 300 mg by mouth 3 (three) times daily.   Yes Historical Provider, MD  hydrOXYzine (ATARAX/VISTARIL) 25  MG tablet Take 25 mg by mouth every 6 (six) hours as needed for anxiety.   Yes Historical Provider, MD  QUEtiapine (SEROQUEL) 300 MG tablet Take 300 mg by mouth at bedtime.   Yes Historical Provider, MD  traZODone (DESYREL) 150 MG tablet Take 150 mg by mouth at bedtime.   Yes Historical Provider, MD  HYDROcodone-acetaminophen (NORCO/VICODIN) 5-325 MG per tablet Take 1-2 tablets every 6 hours as needed for severe pain 03/29/14   Renne CriglerJoshua Geiple, PA-C  promethazine (PHENERGAN) 12.5 MG tablet Take 1 tablet (12.5 mg total) by mouth every 6 (six) hours as needed for nausea or vomiting. Patient not taking: Reported on 04/28/2014 04/12/14   Einar GipWilliam Duncan Dedric Ethington, PA-C   BP 119/71 mmHg  Pulse 78  Temp(Src) 97.9 F (36.6 C) (Oral)  Resp 24  Ht 6\' 1"  (1.854 m)  Wt 290 lb (131.543 kg)  BMI 38.27 kg/m2  SpO2 99% Physical Exam  Constitutional: He is oriented to person, place, and time. He appears well-developed and well-nourished. No distress.  HENT:  Head: Normocephalic and atraumatic.  Right Ear: External ear normal.  Left Ear: External ear normal.  Mouth/Throat: Oropharynx is clear and moist. No oropharyngeal exudate.  Cerumen noted to the patient's bilateral external auditory canals. No impaction noted. Some mild erythema noted to his left TM. The left TM is intact, there is no bulging or loss of landmarks noted. The patient's right TM is pearly gray without erythema or loss of landmarks.  Eyes: Conjunctivae are normal. Pupils are equal, round, and reactive to light. Right eye exhibits no discharge. Left eye exhibits no discharge.  Patient has chronic disconjugate gaze.  Neck: Neck supple.  Cardiovascular: Normal rate, regular rhythm, normal heart sounds and intact distal pulses.  Exam reveals no gallop and no friction rub.   No murmur heard. Pulmonary/Chest: Effort normal and breath sounds normal. No respiratory distress. He has no wheezes. He has no rales.  Abdominal: Soft. There is no  tenderness.  Musculoskeletal: He exhibits no edema.  Lymphadenopathy:    He has no cervical adenopathy.  Neurological: He is alert and oriented to person, place, and time. Coordination normal.  Skin: Skin is warm and dry. No rash noted. He is not diaphoretic. No erythema. No pallor.  Psychiatric: His behavior is normal. His mood appears anxious. His affect is not angry, not blunt, not labile and not inappropriate. His speech is not rapid and/or pressured, not delayed and not slurred. He is not agitated, not aggressive, not hyperactive, not slowed, not withdrawn, not actively hallucinating and not combative. Thought content is not delusional. He exhibits a depressed mood. He expresses suicidal ideation. He expresses no homicidal ideation. He expresses no suicidal plans and no homicidal plans. He is attentive.  Nursing note and vitals reviewed.   ED Course  Procedures (including critical care time) Labs Review Labs Reviewed  URINE RAPID DRUG SCREEN (HOSP PERFORMED) - Abnormal; Notable for the following:    Barbiturates POSITIVE (*)    All other components within normal limits  CBC WITH DIFFERENTIAL  COMPREHENSIVE METABOLIC PANEL  ETHANOL    Imaging Review No results found.   EKG Interpretation None      Filed Vitals:   04/28/14 1858 04/28/14 2335  BP: 132/76 119/71  Pulse: 102 78  Temp: 98.4 F (36.9 C) 97.9 F (36.6 C)  TempSrc: Oral Oral  Resp: 18 24  Height: 6\' 1"  (1.854 m)   Weight: 290 lb (131.543 kg)   SpO2: 97% 99%     MDM   Meds given in ED:  Medications - No data to display  Discharge Medication List as of 04/29/2014 12:08 AM      Final diagnoses:  Suicidal ideation   Hervey ArdChristopher M Portal is a 33 y.o. male with history of depression, bipolar disorder, hypertension, and anxiety and [facility] complaining of suicidal ideations and depressed mood. The patient denies plan for suicide. The patient [facility]  [facility] His last admission was in September 2015.  Patient check screen was positive for barbiturates. Patient CBC, CMP are unremarkable. Patient's ethanol level is less than 11. Patient was accepted for admission [facility].  Patient was discussed with Dr. Jeraldine LootsLockwood who agrees with assessment and plan.    Lawana ChambersWilliam Duncan Sherrilynn Gudgel, PA-C 04/29/14 0125  Gerhard Munchobert Lockwood, MD 05/04/14 [phone]

## 2014-04-29 ENCOUNTER — Inpatient Hospital Stay (HOSPITAL_COMMUNITY)
Admission: EM | Admit: 2014-04-29 | Discharge: 2014-05-06 | DRG: 885 | Disposition: A | Discharge status: Home / Self Care | Payer: Federal, State, Local not specified - Other | Source: Intra-hospital | Attending: Psychiatry | Admitting: Psychiatry

## 2014-04-29 ENCOUNTER — Encounter (HOSPITAL_COMMUNITY): Payer: Self-pay | Admitting: *Deleted

## 2014-04-29 DIAGNOSIS — Z8249 Family history of ischemic heart disease and other diseases of the circulatory system: Secondary | ICD-10-CM

## 2014-04-29 DIAGNOSIS — J45909 Unspecified asthma, uncomplicated: Secondary | ICD-10-CM | POA: Diagnosis present

## 2014-04-29 DIAGNOSIS — G4700 Insomnia, unspecified: Secondary | ICD-10-CM | POA: Diagnosis present

## 2014-04-29 DIAGNOSIS — I10 Essential (primary) hypertension: Secondary | ICD-10-CM | POA: Diagnosis present

## 2014-04-29 DIAGNOSIS — R45851 Suicidal ideations: Secondary | ICD-10-CM | POA: Diagnosis present

## 2014-04-29 DIAGNOSIS — F410 Panic disorder [episodic paroxysmal anxiety] without agoraphobia: Secondary | ICD-10-CM | POA: Diagnosis present

## 2014-04-29 DIAGNOSIS — F411 Generalized anxiety disorder: Secondary | ICD-10-CM | POA: Diagnosis present

## 2014-04-29 DIAGNOSIS — Z23 Encounter for immunization: Secondary | ICD-10-CM

## 2014-04-29 DIAGNOSIS — F333 Major depressive disorder, recurrent, severe with psychotic symptoms: Principal | ICD-10-CM | POA: Diagnosis present

## 2014-04-29 DIAGNOSIS — F332 Major depressive disorder, recurrent severe without psychotic features: Secondary | ICD-10-CM | POA: Diagnosis present

## 2014-04-29 MED ORDER — QUETIAPINE FUMARATE 300 MG PO TABS
300.0000 mg | ORAL_TABLET | Freq: Every day | ORAL | Status: DC
  Administered 2014-04-29 – 2014-05-03 (×6): 300 mg via ORAL
  Filled 2014-04-29 (×10): qty 1

## 2014-04-29 MED ORDER — ACETAMINOPHEN 325 MG PO TABS
650.0000 mg | ORAL_TABLET | Freq: Four times a day (QID) | ORAL | Status: DC | PRN
  Administered 2014-05-01 – 2014-05-03 (×3): 650 mg via ORAL
  Filled 2014-04-29 (×3): qty 2

## 2014-04-29 MED ORDER — HYDROCODONE-ACETAMINOPHEN 5-325 MG PO TABS: 2.0000 | ORAL_TABLET | Freq: Four times a day (QID) | ORAL | Status: DC | PRN

## 2014-04-29 MED ORDER — BUPROPION HCL ER (XL) 150 MG PO TB24
150.0000 mg | ORAL_TABLET | Freq: Every day | ORAL | Status: DC
  Administered 2014-04-29 – 2014-05-04 (×6): 150 mg via ORAL
  Filled 2014-04-29 (×8): qty 1

## 2014-04-29 MED ORDER — DULOXETINE HCL 60 MG PO CPEP
60.0000 mg | ORAL_CAPSULE | Freq: Every day | ORAL | Status: DC
  Administered 2014-04-29 – 2014-05-06 (×8): 60 mg via ORAL
  Filled 2014-04-29 (×10): qty 1

## 2014-04-29 MED ORDER — MAGNESIUM HYDROXIDE 400 MG/5ML PO SUSP: 30.0000 mL | Freq: Every day | ORAL | Status: DC | PRN

## 2014-04-29 MED ORDER — TRAZODONE HCL 150 MG PO TABS
150.0000 mg | ORAL_TABLET | Freq: Every day | ORAL | Status: DC
  Administered 2014-04-29 (×2): 150 mg via ORAL
  Filled 2014-04-29 (×7): qty 1

## 2014-04-29 MED ORDER — GABAPENTIN 300 MG PO CAPS
300.0000 mg | ORAL_CAPSULE | Freq: Three times a day (TID) | ORAL | Status: DC
  Administered 2014-04-29 – 2014-05-06 (×22): 300 mg via ORAL
  Filled 2014-04-29 (×26): qty 1

## 2014-04-29 MED ORDER — HYDROXYZINE HCL 25 MG PO TABS
25.0000 mg | ORAL_TABLET | Freq: Four times a day (QID) | ORAL | Status: DC | PRN
  Administered 2014-05-03: 25 mg via ORAL
  Filled 2014-04-29: qty 1
  Filled 2014-04-29: qty 30

## 2014-04-29 MED ORDER — ALUM & MAG HYDROXIDE-SIMETH 200-200-20 MG/5ML PO SUSP
30.0000 mL | ORAL | Status: DC | PRN
  Administered 2014-04-30 – 2014-05-05 (×4): 30 mL via ORAL
  Filled 2014-04-29 (×4): qty 30

## 2014-04-29 MED ORDER — ALBUTEROL SULFATE HFA 108 (90 BASE) MCG/ACT IN AERS: 2.0000 | INHALATION_SPRAY | RESPIRATORY_TRACT | Status: DC | PRN

## 2014-04-29 MED ORDER — INFLUENZA VAC SPLIT QUAD 0.5 ML IM SUSY
0.5000 mL | PREFILLED_SYRINGE | INTRAMUSCULAR | Status: AC
  Administered 2014-04-30: 0.5 mL via INTRAMUSCULAR
  Filled 2014-04-29: qty 0.5

## 2014-04-29 NOTE — Tx Team (Signed)
Initial Interdisciplinary Treatment Plan   PATIENT STRESSORS: Financial difficulties Marital or family conflict Anniversary of mother's death 3 yrs ago   PATIENT STRENGTHS: Ability for insight Average or above average intelligence General fund of knowledge Motivation for treatment/growth Supportive family/friends   PROBLEM LIST: Problem List/Patient Goals Date to be addressed Date deferred Reason deferred Estimated date of resolution  Depression      Grief/loss- anniversary of mother's death 3 yrs ago-Hearing his mother's voice      Risk for self harm- having thoughts to hurt self-"I wish I was with my mom"      Family conflict- was living with sister, but cannot return("I feel like a burden to her")            Pt wants help with his suicidal thoughts and help with finding another place to stay                         DISCHARGE CRITERIA:  Ability to meet basic life and health needs Adequate post-discharge living arrangements Improved stabilization in mood, thinking, and/or behavior Motivation to continue treatment in a less acute level of care Safe-care adequate arrangements made Verbal commitment to aftercare and medication compliance  PRELIMINARY DISCHARGE PLAN: Attend aftercare/continuing care group Attend PHP/IOP Placement in alternative living arrangements  PATIENT/FAMIILY INVOLVEMENT: This treatment plan has been presented to and reviewed with the patient, Hervey ArdChristopher M Devlin, and/or family member.  The patient and family have been given the opportunity to ask questions and make suggestions.  Jesus GeneraSpeagle, Natha Guin [facility] 04/29/2014, 1:01 AM

## 2014-04-29 NOTE — [facility] Suicide Risk Assessment (Signed)
[facility] INPATIENT:  Family/Significant Other Suicide Prevention Education  Suicide Prevention Education:  Patient Refusal for Family/Significant Other Suicide Prevention Education: The patient [name] [name] has refused to provide written consent for family/significant other to be provided Family/Significant Other Suicide Prevention Education during admission and/or prior to discharge.  Physician notified.  SPE completed with pt. SPI pamphlet provided to pt and he was encouraged to share information with support network, ask questions, and talk about any concerns relating to SPE.  Smart, Vegas Coffin LCSWA  04/29/2014, 3:15 PM

## 2014-04-29 NOTE — [facility] Group Notes (Signed)
[facility] LCSW Group Therapy 04/29/2014  1:15 PM   Type of Therapy: Group Therapy  Participation Level: Did Not Attend for unknown reason.   Emmery Seiler, MSW, LCSWA Clinical Social Worker [facility] [phone]    

## 2014-04-29 NOTE — [facility] Group Notes (Signed)
[facility] LCSW Group Therapy 04/29/2014  1:15 PM   Type of Therapy: Group Therapy  Participation Level: Did Not Attend- patient sleeping.   Samuella BruinKristin Yossi Hinchman, MSW, [employer]CS[employer] Clinical Social Worker [facility] [phone]

## 2014-04-29 NOTE — Progress Notes (Signed)
Vol admit, 33 yo caucasian male, who presented to the ED c/o depression and suicidal thoughts.  Pt reports he lives with his sister, but he feels like a burden to her.  He says it is the anniversary of his mother's death 3 yrs ago and he is feeling very depressed.  He also says he has not slept in 3 days.  Pt reports he is still having suicidal thoughts, but he is able to contract for safety here.  He says he feels safe here.  Pt was recently here in September of this year.  Pt was pleasant/cooperative with the admission.  He reports a medical hx of hypertension and asthma.  Paperwork was signed and search was completed.  Pt was provided a meal during the admission.  Pt was reoriented to unit.  Safety checks q15 minutes were initiated.

## 2014-04-29 NOTE — Progress Notes (Signed)
Patient ID: [name] [name], male   DOB: 07/03/1980, 33 y.o.   MRN: [id number] D- Patient alert and oriented. Patient presents as anxious, sad, and depressed. Currently denies SI, HI, AVH, and pain. Patient did not attend evening group and remained in the bed. Patient verbalized no current complaints.   A- Support and encouragement provided.  Medications administered per MD ordered. Patient is encouraged to attend all groups and activities provided. Routine safety checks conducted every 15 minutes. Patient informed to notify staff with problems or concerns. R- Patient verbally contracts for safety. Patient is calm and cooperative.
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
## 2014-04-29 NOTE — Progress Notes (Signed)
D:  Patient denied HI.  Denied visual hallucinations.  SI, no plan, contracts for safety.  Patient continues to hear mumblings.  Rated depression 8, anxiety and hopeless 7. A:  Medications administered per MD orders.  Emotional support and encouragement given patient. R:  Safety maintained with 15 minute checks.

## 2014-04-29 NOTE — Progress Notes (Signed)
Patient did not attend the evening speaker NA meeting. Pt was notified that group was beginning but remained in bed.   

## 2014-04-29 NOTE — Plan of Care (Signed)
Problem: Consults Goal: Suicide Risk Patient Education (See Patient Education module for education specifics)  Outcome: Completed/Met Date Met:  04/29/14 Nurse discussed suicide thoughts with nurse.

## 2014-04-29 NOTE — [facility] Suicide Risk Assessment (Signed)
Suicide Risk Assessment  Admission Assessment     Nursing information obtained from:  Patient, Review of record Demographic factors:  Caucasian, Low socioeconomic status, Unemployed Current Mental Status:  Suicidal ideation indicated by patient, Self-harm thoughts Loss Factors:  Loss of significant relationship, Financial problems / change in socioeconomic status Historical Factors:  Prior suicide attempts Risk Reduction Factors:  Positive social support Total Time spent with patient: 45 minutes  CLINICAL FACTORS:   Depression:   Comorbid alcohol abuse/dependence  COGNITIVE FEATURES THAT CONTRIBUTE TO RISK:  Closed-mindedness Polarized thinking Thought constriction (tunnel vision)    SUICIDE RISK:   Minimal: No identifiable suicidal ideation.  Patients presenting with no risk factors but with morbid ruminations; may be classified as minimal risk based on the severity of the depressive symptoms  PLAN OF CARE: Supportive approach/coping skills/relapse prevention                               Reassess and optimize response to the psychotropics  I certify that inpatient services furnished can reasonably be expected to improve the patient's condition.  Geraldo Haris A 04/29/2014, 2:33 PM

## 2014-04-30 DIAGNOSIS — F329 Major depressive disorder, single episode, unspecified: Secondary | ICD-10-CM

## 2014-04-30 NOTE — Progress Notes (Signed)
[facility] MD Progress Note  04/30/2014 2:19 PM [name] [name]  MRN:  [id number]  Subjective:  [name] [name] reports, "I'm feeling better today that I was yesterday".  O: Rinaldo says,The voices are calming down some. Sleep is fair, appetite is improving. He is participating in groups.  Diagnosis:   DSM5: Schizophrenia Disorders:  NA Obsessive-Compulsive Disorders:  NA Trauma-Stressor Disorders:  NA Substance/Addictive Disorders:  Hx. Alcohol dependence Depressive Disorders:  Major Depressive Disorder - Severe (296.23) Total Time spent with patient: 35 minutes  Axis I: MDD, recurrent episodes, severe Axis II: Deferred Axis III:  Past Medical History  Diagnosis Date  . Mental disorder   . Hypertension   . Depression   . Bipolar 1 disorder   . Irregular heart rate   . Asthma    Axis IV: other psychosocial or environmental problems Axis V: 41-50 serious symptoms  ADL's:  Intact  Sleep: Fair  Appetite:  Fair  Suicidal Ideation:  Denies Homicidal Ideation:  Denies AEB (as evidenced by):  Psychiatric Specialty Exam: Physical Exam  Psychiatric: His speech is normal. Judgment and thought content normal. His mood appears anxious. His affect is not angry, not blunt, not labile and not inappropriate. Cognition and memory are normal. He exhibits a depressed mood.    Review of Systems  Constitutional: Negative.   HENT: Negative.   Eyes: Negative.   Cardiovascular: Negative.   Gastrointestinal: Negative.   Genitourinary: Negative.   Skin: Negative.   Neurological: Negative.   Endo/Heme/Allergies: Negative.   Psychiatric/Behavioral: Positive for depression, hallucinations and substance abuse (Alcoholism). Negative for suicidal ideas and memory loss. The patient is nervous/anxious and has insomnia.     Blood pressure 105/64, pulse 88, temperature 97.7 F (36.5 C), temperature source Oral, resp. rate 20, height 5' 10" (1.778 m), weight 122.471 kg (270 lb), SpO2 98 %.Body  mass index is 38.74 kg/(m^2).  General Appearance: Casual  Eye Contact::  Good  Speech:  Clear and Coherent  Volume:  Normal  Mood:  Improving  Affect:  Appropriate  Thought Process:  Coherent  Orientation:  Full (Time, Place, and Person)  Thought Content:  Hallucinations: Auditory  Suicidal Thoughts:  No  Homicidal Thoughts:  No  Memory:  Negative NA Immediate;   Good Recent;   Good Remote;   Good  Judgement:  Fair  Insight:  Present  Psychomotor Activity:  Normal  Concentration:  Fair  Recall:  Good  Fund of Knowledge:Fair  Language: Fair  Akathisia:  No  Handed:  Right  AIMS (if indicated):     Assets:  Desire for Improvement  Sleep:  Number of Hours: 6.75   Musculoskeletal: Strength & Muscle Tone: within normal limits Gait & Station: normal Patient leans: N/A  Current Medications: Current Facility-Administered Medications  Medication Dose Route Frequency Provider Last Rate Last Dose  . acetaminophen (TYLENOL) tablet 650 mg  650 mg Oral Q6H PRN Spencer E Simon, PA-C      . albuterol (PROVENTIL HFA;VENTOLIN HFA) 108 (90 BASE) MCG/ACT inhaler 2 puff  2 puff Inhalation Q4H PRN Spencer E Simon, PA-C      . alum & mag hydroxide-simeth (MAALOX/MYLANTA) 200-200-20 MG/5ML suspension 30 mL  30 mL Oral Q4H PRN Spencer E Simon, PA-C      . buPROPion (WELLBUTRIN XL) 24 hr tablet 150 mg  150 mg Oral Daily Spencer E Simon, PA-C   150 mg at 04/30/14 0759  . DULoxetine (CYMBALTA) DR capsule 60 mg  60 mg Oral Daily Spencer E Simon, PA-C     60 mg at 04/30/14 0759  . gabapentin (NEURONTIN) capsule 300 mg  300 mg Oral TID Laverle Hobby, PA-C   300 mg at 04/30/14 1137  . HYDROcodone-acetaminophen (NORCO/VICODIN) 5-325 MG per tablet 2 tablet  2 tablet Oral Q6H PRN Laverle Hobby, PA-C      . hydrOXYzine (ATARAX/VISTARIL) tablet 25 mg  25 mg Oral Q6H PRN Laverle Hobby, PA-C      . magnesium hydroxide (MILK OF MAGNESIA) suspension 30 mL  30 mL Oral Daily PRN Laverle Hobby, PA-C      .  QUEtiapine (SEROQUEL) tablet 300 mg  300 mg Oral QHS Laverle Hobby, PA-C   300 mg at 04/29/14 2149  . traZODone (DESYREL) tablet 150 mg  150 mg Oral QHS Laverle Hobby, PA-C   150 mg at 04/29/14 2149    Lab Results:  Results for orders placed or performed during the hospital encounter of 04/28/14 (from the past 48 hour(s))  CBC WITH DIFFERENTIAL     Status: None   Collection Time: 04/28/14  7:40 PM  Result Value Ref Range   WBC 7.4 4.0 - 10.5 K/uL   RBC 4.57 4.22 - 5.81 MIL/uL   Hemoglobin 13.4 13.0 - 17.0 g/dL   HCT 40.7 39.0 - 52.0 %   MCV 89.1 78.0 - 100.0 fL   MCH 29.3 26.0 - 34.0 pg   MCHC 32.9 30.0 - 36.0 g/dL   RDW 13.1 11.5 - 15.5 %   Platelets 270 150 - 400 K/uL   Neutrophils Relative % 62 43 - 77 %   Neutro Abs 4.6 1.7 - 7.7 K/uL   Lymphocytes Relative 25 12 - 46 %   Lymphs Abs 1.8 0.7 - 4.0 K/uL   Monocytes Relative 11 3 - 12 %   Monocytes Absolute 0.8 0.1 - 1.0 K/uL   Eosinophils Relative 2 0 - 5 %   Eosinophils Absolute 0.1 0.0 - 0.7 K/uL   Basophils Relative 0 0 - 1 %   Basophils Absolute 0.0 0.0 - 0.1 K/uL  Comprehensive metabolic panel     Status: None   Collection Time: 04/28/14  7:40 PM  Result Value Ref Range   Sodium 143 137 - 147 mEq/L   Potassium 3.9 3.7 - 5.3 mEq/L   Chloride 105 96 - 112 mEq/L   CO2 23 19 - 32 mEq/L   Glucose, Bld 89 70 - 99 mg/dL   BUN 14 6 - 23 mg/dL   Creatinine, Ser 0.93 0.50 - 1.35 mg/dL   Calcium 9.0 8.4 - 10.5 mg/dL   Total Protein 6.7 6.0 - 8.3 g/dL   Albumin 3.9 3.5 - 5.2 g/dL   AST 16 0 - 37 U/L    Comment: HEMOLYSIS AT THIS LEVEL MAY AFFECT RESULT   ALT 15 0 - 53 U/L   Alkaline Phosphatase 108 39 - 117 U/L   Total Bilirubin 1.0 0.3 - 1.2 mg/dL   GFR calc non Af Amer >90 >90 mL/min   GFR calc Af Amer >90 >90 mL/min    Comment: (NOTE) The eGFR has been calculated using the CKD EPI equation. This calculation has not been validated in all clinical situations. eGFR's persistently <90 mL/min signify possible Chronic  Kidney Disease.    Anion gap 15 5 - 15  Ethanol     Status: None   Collection Time: 04/28/14  7:40 PM  Result Value Ref Range   Alcohol, Ethyl (B) <11 0 - 11 mg/dL  Comment:        LOWEST DETECTABLE LIMIT FOR SERUM ALCOHOL IS 11 mg/dL FOR MEDICAL PURPOSES ONLY   Drug screen panel, emergency     Status: Abnormal   Collection Time: 04/28/14 10:36 PM  Result Value Ref Range   Opiates NONE DETECTED NONE DETECTED   Cocaine NONE DETECTED NONE DETECTED   Benzodiazepines NONE DETECTED NONE DETECTED   Amphetamines NONE DETECTED NONE DETECTED   Tetrahydrocannabinol NONE DETECTED NONE DETECTED   Barbiturates POSITIVE (A) NONE DETECTED    Comment:        DRUG SCREEN FOR MEDICAL PURPOSES ONLY.  IF CONFIRMATION IS NEEDED FOR ANY PURPOSE, NOTIFY LAB WITHIN 5 DAYS.        LOWEST DETECTABLE LIMITS FOR URINE DRUG SCREEN Drug Class       Cutoff (ng/mL) Amphetamine      1000 Barbiturate      200 Benzodiazepine   532 Tricyclics       992 Opiates          300 Cocaine          300 THC              50     Physical Findings: AIMS: Facial and Oral Movements Muscles of Facial Expression: None, normal Lips and Perioral Area: None, normal Jaw: None, normal Tongue: None, normal,Extremity Movements Upper (arms, wrists, hands, fingers): None, normal Lower (legs, knees, ankles, toes): None, normal, Trunk Movements Neck, shoulders, hips: None, normal, Overall Severity Severity of abnormal movements (highest score from questions above): None, normal Incapacitation due to abnormal movements: None, normal Patient's awareness of abnormal movements (rate only patient's report): No Awareness, Dental Status Current problems with teeth and/or dentures?: No Does patient usually wear dentures?: No  CIWA:  CIWA-Ar Total: 1 COWS:  COWS Total Score: 1  Treatment Plan Summary: Daily contact with patient to assess and evaluate symptoms and progress in treatment Medication management  Plan:  1.  Continue crisis management and stabilization.  2. Medication management: Continue current treatment plan. 3. Encouraged patient to attend groups and participate in group counseling sessions and activities.  4. Discharge plan in progress.   5. Address health issues: Vitals reviewed and stable.   Medical Decision Making Problem Points:  Established problem, stable/improving (1), Review of last therapy session (1) and Review of psycho-social stressors (1) Data Points:  Review of medication regiment & side effects (2) Review of new medications or change in dosage (2)  I certify that inpatient services furnished can reasonably be expected to improve the patient's condition.   Lindell Spar I, PMHNP-BC 04/30/2014, 2:19 PM I agreed with the findings, treatment and disposition plan of this patient. Berniece Andreas, MD

## 2014-04-30 NOTE — Plan of Care (Signed)
Problem: Diagnosis: Increased Risk For Suicide Attempt Goal: STG-Patient Will Comply With Medication Regime Outcome: Progressing     

## 2014-04-30 NOTE — Progress Notes (Signed)
Patient ID: [name] [name], male   DOB: 05/03/1981, 33 y.o.   MRN: [id number]  D: Pt currently presents with a depressed affect and behavior. Per self inventory, pt rates depression at a 8, hopelessness 8 and anxiety 8. Pt's daily goal is to "going to group." Pt reports fair sleep and appetite. Low energy level. Pt reports that he has been having thoughts of hurting himself.    A: Pt provided with medications per providers orders. Pt's labs and vitals were monitored throughout the day. Pt supported emotionally and encouraged to express concerns and questions. Pt consulted with provider and nurse. Pt educated on medication therapy. Pt given and educated on the influenza vaccine.    R: Pt's safety ensured with 15 minute and environmental checks. Pt reports passive SI. Pt currently denies HI and A/V hallucinations. Pt verbally agrees to seek staff if SI/HI or A/VH occurs and to consult with staff before acting on these thoughts.    Aurora Maskwyman, Donn Wilmot E, RN

## 2014-04-30 NOTE — Progress Notes (Signed)
Adult Psychoeducational Group Note  Date:  04/30/2014 Time:  12:30 PM  Group Topic/Focus:  Orientation:   The focus of this group is to educate the patient on the purpose and policies of crisis stabilization and provide a format to answer questions about their admission.  The group details unit policies and expectations of patients while admitted.  Participation Level:  Minimal  Participation Quality:  Appropriate and Drowsy  Affect:  Depressed and Flat  Cognitive:  Alert and Appropriate  Insight: Appropriate  Engagement in Group:  Limited  Modes of Intervention:  Orientation and Support  Additional Comments:  Pt reports being thankful for "being alive."  Aurora Maskwyman, Mikyle Sox E 04/30/2014, 12:30 PM

## 2014-05-01 DIAGNOSIS — F333 Major depressive disorder, recurrent, severe with psychotic symptoms: Secondary | ICD-10-CM | POA: Insufficient documentation

## 2014-05-01 MED ORDER — TRAZODONE HCL 100 MG PO TABS
200.0000 mg | ORAL_TABLET | Freq: Every day | ORAL | Status: DC
  Administered 2014-05-01 – 2014-05-03 (×3): 200 mg via ORAL
  Filled 2014-05-01 (×5): qty 2

## 2014-05-01 NOTE — Plan of Care (Signed)
Problem: Ineffective individual coping Goal: STG: Patient will remain free from self harm Outcome: Progressing     

## 2014-05-01 NOTE — Progress Notes (Signed)
Patient ID: [name] [name], male   DOB: 05/09/1981, 33 y.o.   MRN: [id number] [facility]  05/01/2014 9:52 AM [name] Ardhristopher M [name]  MRN:  [id number]  Subjective:  [name] [name] reports today that he did not sleep well last night. Is endorsing worsening depression because he is not sleeping well at night and does not feel rested. Says he feels his body is fighting a loosing battle.  [name] [name]: Dylan presents with worsening depression which he blames on not being able to sleep at night. Per documentation, he slept for about 6.75 hours last night. Will increase Trazodone to 200 mg Q hs for insomnia. [name] [name] is encouraged to participate in group counseling sessions. He denies any HI, endorsing passive SI.  Diagnosis:   DSM5: Schizophrenia Disorders:  NA Obsessive-Compulsive Disorders:  NA Trauma-Stressor Disorders:  NA Substance/Addictive Disorders:  Hx. Alcohol dependence Depressive Disorders:  Major Depressive Disorder - Severe (296.23) Total Time spent with patient: 35 minutes  Axis I: MDD, recurrent episodes, severe Axis II: Deferred Axis III:  Past Medical History  Diagnosis Date  . Mental disorder   . Hypertension   . Depression   . Bipolar 1 disorder   . Irregular heart rate   . Asthma    Axis IV: other psychosocial or environmental problems Axis V: 41-50 serious symptoms  ADL's:  Intact  Sleep: Fair  Appetite:  Fair  Suicidal Ideation:  Denies Homicidal Ideation:  Denies AEB (as evidenced by):  Psychiatric Specialty Exam: Physical Exam  Psychiatric: His speech is normal. Judgment and thought content normal. His mood appears anxious. His affect is not angry, not blunt, not labile and not inappropriate. Cognition and memory are normal. He exhibits a depressed mood.    Review of Systems  Constitutional: Negative.   HENT: Negative.   Eyes: Negative.   Cardiovascular: Negative.   Gastrointestinal: Negative.   Genitourinary: Negative.   Skin: Negative.    Neurological: Negative.   Endo/Heme/Allergies: Negative.   Psychiatric/Behavioral: Positive for depression, hallucinations and substance abuse (Alcoholism). Negative for suicidal ideas and memory loss. The patient is nervous/anxious and has insomnia.     Blood pressure 109/77, pulse 91, temperature 97.7 F (36.5 C), temperature source Oral, resp. rate 16, height 5\' 10"  (1.778 m), weight 122.471 kg (270 lb), SpO2 98 %.Body mass index is 38.74 kg/(m^2).  General Appearance: Casual  Eye Contact::  Good  Speech:  Clear and Coherent  Volume:  Normal  Mood:  Depressed and Worthless  Affect:  Flat  Thought Process:  Coherent  Orientation:  Full (Time, Place, and Person)  Thought Content:  Hallucinations: Auditory  Suicidal Thoughts:  Yes.  without intent/plan (passive)  Homicidal Thoughts:  No  Memory:  Negative NA Immediate;   Good Recent;   Good Remote;   Good  Judgement:  Fair  Insight:  Present  Psychomotor Activity:  Normal  Concentration:  Fair  Recall:  Good  Fund of Knowledge:Fair  Language: Fair  Akathisia:  No  Handed:  Right  AIMS (if indicated):     Assets:  Desire for Improvement  Sleep:  Number of Hours: 6.75   Musculoskeletal: Strength & Muscle Tone: within normal limits Gait & Station: normal Patient leans: N/A  Current Medications: Current Facility-Administered Medications  Medication Dose Route Frequency Provider Last Rate Last Dose  . acetaminophen (TYLENOL) tablet 650 mg  650 mg Oral Q6H PRN Kerry HoughSpencer E Simon, PA-C      . albuterol (PROVENTIL HFA;VENTOLIN HFA) 108 (90 BASE) MCG/ACT inhaler 2  puff  2 puff Inhalation Q4H PRN Kerry HoughSpencer E Simon, PA-C      . alum & mag hydroxide-simeth (MAALOX/MYLANTA) 200-200-20 MG/5ML suspension 30 mL  30 mL Oral Q4H PRN Kerry HoughSpencer E Simon, PA-C   30 mL at 04/30/14 1701  . buPROPion (WELLBUTRIN XL) 24 hr tablet 150 mg  150 mg Oral Daily Kerry HoughSpencer E Simon, PA-C   150 mg at 05/01/14 0850  . DULoxetine (CYMBALTA) DR capsule 60 mg  60  mg Oral Daily Kerry HoughSpencer E Simon, PA-C   60 mg at 05/01/14 0851  . gabapentin (NEURONTIN) capsule 300 mg  300 mg Oral TID Kerry HoughSpencer E Simon, PA-C   300 mg at 05/01/14 0851  . HYDROcodone-acetaminophen (NORCO/VICODIN) 5-325 MG per tablet 2 tablet  2 tablet Oral Q6H PRN Kerry HoughSpencer E Simon, PA-C      . hydrOXYzine (ATARAX/VISTARIL) tablet 25 mg  25 mg Oral Q6H PRN Kerry HoughSpencer E Simon, PA-C      . magnesium hydroxide (MILK OF MAGNESIA) suspension 30 mL  30 mL Oral Daily PRN Kerry HoughSpencer E Simon, PA-C      . QUEtiapine (SEROQUEL) tablet 300 mg  300 mg Oral QHS Kerry HoughSpencer E Simon, PA-C   300 mg at 05/01/14 0010  . traZODone (DESYREL) tablet 150 mg  150 mg Oral QHS Kerry HoughSpencer E Simon, PA-C   150 mg at 04/29/14 2149    Lab Results:  No results found for this or any previous visit (from the past 48 hour(s)).  Physical Findings: AIMS: Facial and Oral Movements Muscles of Facial Expression: None, normal Lips and Perioral Area: None, normal Jaw: None, normal Tongue: None, normal,Extremity Movements Upper (arms, wrists, hands, fingers): None, normal Lower (legs, knees, ankles, toes): None, normal, Trunk Movements Neck, shoulders, hips: None, normal, Overall Severity Severity of abnormal movements (highest score from questions above): None, normal Incapacitation due to abnormal movements: None, normal Patient's awareness of abnormal movements (rate only patient's report): No Awareness, Dental Status Current problems with teeth and/or dentures?: No Does patient usually wear dentures?: No  CIWA:  CIWA-Ar Total: 1 COWS:  COWS Total Score: 1  Treatment Plan Summary: Daily contact with patient to assess and evaluate symptoms and progress in treatment Medication management  Plan:  1. Continue crisis management and stabilization.  2. Medication management: Continue current treatment plan, increase Trazodone to 200 mg for sleep. 3. Encouraged patient to attend groups and participate in group counseling sessions and  activities.  4. Discharge plan in progress.   5. Address health issues: Vitals reviewed and stable.   Medical Decision Making Problem Points:  Established problem, stable/improving (1), Review of last therapy session (1) and Review of psycho-social stressors (1) Data Points:  Review of medication regiment & side effects (2) Review of new medications or change in dosage (2)  I certify that inpatient services furnished can reasonably be expected to improve the patient's condition.   Armandina Stammerwoko, Agnes I, PMHNP-BC 05/01/2014, 9:52 AM I agreed with the findings, treatment and disposition plan of this patient. Kathryne SharperSyed Yan Okray, MD

## 2014-05-01 NOTE — Progress Notes (Signed)
D:  Pt passive SI-contracts for safetyPt denies HI/AVH. Pt is pleasant and cooperative. Pt stated he has friends in [place], [place] that said he could stay with him if he could get there. Pt stated that would be good because he has nowhere to go as of yet.   A: Pt was offered support and encouragement. Pt was given scheduled medications. Pt was encourage to attend groups. Q 15 minute checks were done for safety.   R:Pt attends groups and interacts well with peers and staff. Pt is taking medication. Pt has no complaints at this time .Pt receptive to treatment and safety maintained on unit.

## 2014-05-01 NOTE — Progress Notes (Signed)
D Pt. Denies SI and HI this pm. Pt. Has been asleep most of this evening, writer had to awaken pt. For assessment. Pt. Continues to rate his depression at an 8 and his anxiety at a 6.  A Writer offered support and encouragement, discussed discharge plans with pt.  R Pt. Remains safe on the unit.  Pt. Reports he is planning to move to [place] where he has family when he discharges.  Pt. Feels this will be a much better living arrangement for him.

## 2014-05-01 NOTE — [facility] Group Notes (Signed)
   [facility] Note  05/01/2014  8:45 AM   Participation Quality: Alert, Appropriate and Oriented  Mood/Affect: Depressed and Flat  Depression Rating: 10  Anxiety Rating: 8  Thoughts of Suicide: Pt denies SI/HI  Will you contract for safety? Yes  Current AVH: Pt denies  Plan for Discharge/Comments: Pt attended discharge planning group and actively participated in group. CSW provided pt with today's workbook. Patient reports that he feels "not too good" today. Patient endorses high levels of depression today. He would like to discharge to [place] with family but does not have funds to get there.  Transportation Means: Pt requesting assistance with transportation to [place]  Supports: Patient has identified his family in [place] as supports.  Samuella BruinKristin Dace Denn, MSW, [employer]CSWA Clinical Social Worker [facility] [phone]

## 2014-05-01 NOTE — Plan of Care (Signed)
Problem: Alteration in mood Goal: STG-Patient is able to discuss feelings and issues (Patient is able to discuss feelings and issues leading to depression)  Outcome: Progressing

## 2014-05-01 NOTE — Progress Notes (Signed)
Patient ID: [name] [name], male   DOB: 11/20/1980, 33 y.o.   MRN: [id number]  D: Pt currently presents with a flat affect, depressed mood and behavior. Per self inventory, pt rates depression at a 8, hopelessness 8 and anxiety 8. Pt's daily goal does not report having a daily goal. Pt says that he will brainstorm a goal throughout the day. Pt reports poor sleep and concentration but a good appetite.    A: Pt provided with medications per providers orders. Pt's labs and vitals were monitored throughout the day. Pt supported emotionally and encouraged to express concerns and questions. Pt consulted with nurse. Pt educated on medication administration and positive coping skills.   R: Pt's safety ensured with 15 minute and environmental checks. Pt endorses having SI, stating "I woke up this morning with suicidal ideation but I feel better now." Pt currently denies HI and A/V hallucinations. Pt verbally agrees to seek staff if SI/HI or A/VH occurs and to consult with staff before acting on these thoughts. Pt states that he "wants to go to live with family in [place]. Once I can make it down there, I have a job waiting for me and things will be better."   Twyman, Sung AmabileSara E, RN

## 2014-05-02 NOTE — [facility] Group Notes (Signed)
[facility] LCSW Group Therapy 05/02/2014 11:32 AM  Type of Therapy and Topic: Group Therapy: Avoiding Self-Sabotaging and Enabling Behaviors   Participation Level: Reserved  Mood: Depressed  Description of Group:  Learn how to identify obstacles, self-sabotaging and enabling behaviors, what are they, why do we do them and what needs do these behaviors meet? Discuss unhealthy relationships and how to have positive healthy boundaries with those that sabotage and enable. Explore aspects of self-sabotage and enabling in yourself and how to limit these self-destructive behaviors in everyday life. A scaling question is used to help patient look at where they are now in their motivation to change, from 1 to 10 (lowest to highest motivation).   Therapeutic Goals:  1. Patient will identify one obstacle that relates to self-sabotage and enabling behaviors 2. Patient will identify one personal self-sabotaging or enabling behavior they did prior to admission 3. Patient able to establish a plan to change the above identified behavior they did prior to admission:  4. Patient will demonstrate ability to communicate their needs through discussion and/or role plays.  Summary of Patient Progress:  Pt was reserved during group discussion but participates when prompted. Pt identified his alcohol use as a self-sabotaging behavior, reporting that he "does things I'm not supposed to when I drink." Pt equated alcohol use to a friend reporting that he often uses it when he has negative emotions such as guilt, shame, depression, etc. Pt also described self-sabotage as an escape method and a way to seek peace.  Therapeutic Modalities:  Cognitive Behavioral Therapy  Person-Centered Therapy  Motivational Interviewing   Chad CordialLauren Carter, LCSWA 05/02/2014 11:32 AM

## 2014-05-02 NOTE — Progress Notes (Signed)
Pt did not attend group this evening.  

## 2014-05-02 NOTE — Progress Notes (Signed)
D Pt. Continues to report passive SI but contracts for safety.  Pt. Was asleep when writer arrived and had to be awakened to attend group.  A Writer offered support and encouragement,.  R Pt. Remains safe on the unit, continues to rate his depression at a 8 and his anxiety at a 6 this pm stating d/t the unknown of where he will go at discharge.

## 2014-05-02 NOTE — [facility] Group Notes (Signed)
0900 nursing orientation group   The focus of this group is to educate the patient on the purpose and policies of crisis stabilization and provide a format to answer questions about their admission.  The group details unit policies and expectations of patients while admitted.   Pt was an active participant in group and was appropriate in sharing.  

## 2014-05-02 NOTE — Progress Notes (Signed)
Patient ID: [name] [name], male   DOB: 06/03/1981, 33 y.o.   MRN: [id number] Patient ID: [name] ArdChristopher M [name], male   DOB: 03/13/1981, 33 y.o.   MRN: [id number] [facility]  05/02/2014 12:11 PM [name] [name]  MRN:  [id number]  Subjective:  [name] [name] says he is feeling a lot better today. He states that he is trying to keep calm and positive. However, says he is stressed out trying to figure out how he is going to take care of things once he is discharged from here.  O: Patient seen, chart reviewed. [name] [name] presents with a good attitude and optimism today. He is visible on the unit, participating in group sessions. Denies any SIHI, AVH, delusional thoughts and or paranoia.  Diagnosis:   DSM5: Schizophrenia Disorders:  NA Obsessive-Compulsive Disorders:  NA Trauma-Stressor Disorders:  NA Substance/Addictive Disorders:  Hx. Alcohol dependence Depressive Disorders:  Major Depressive Disorder - Severe (296.23) Total Time spent with patient: 35 minutes  Axis I: MDD, recurrent episodes, severe Axis II: Deferred Axis III:  Past Medical History  Diagnosis Date  . Mental disorder   . Hypertension   . Depression   . Bipolar 1 disorder   . Irregular heart rate   . Asthma    Axis IV: other psychosocial or environmental problems Axis V: 41-50 serious symptoms  ADL's:  Intact  Sleep: Fair  Appetite:  Fair  Suicidal Ideation:  Denies Homicidal Ideation:  Denies AEB (as evidenced by):  Psychiatric Specialty Exam: Physical Exam  Psychiatric: His speech is normal. Judgment and thought content normal. His mood appears anxious. His affect is not angry, not blunt, not labile and not inappropriate. Cognition and memory are normal. He exhibits a depressed mood.    Review of Systems  Constitutional: Negative.   HENT: Negative.   Eyes: Negative.   Cardiovascular: Negative.   Gastrointestinal: Negative.   Genitourinary: Negative.   Skin: Negative.   Neurological:  Negative.   Endo/Heme/Allergies: Negative.   Psychiatric/Behavioral: Positive for depression, hallucinations and substance abuse (Alcoholism). Negative for suicidal ideas and memory loss. The patient is nervous/anxious and has insomnia.     Blood pressure 106/69, pulse 105, temperature 97.7 F (36.5 C), temperature source Oral, resp. rate 16, height 5\' 10"  (1.778 m), weight 122.471 kg (270 lb), SpO2 98 %.Body mass index is 38.74 kg/(m^2).  General Appearance: Casual  Eye Contact::  Good  Speech:  Clear and Coherent  Volume:  Normal  Mood:  Improving  Affect:  Appropriate  Thought Process:  Coherent  Orientation:  Full (Time, Place, and Person)  Thought Content:  Rumination  Suicidal Thoughts:  No   Homicidal Thoughts:  No  Memory:  Negative NA Immediate;   Good Recent;   Good Remote;   Good  Judgement:  Fair  Insight:  Present  Psychomotor Activity:  Normal  Concentration:  Fair  Recall:  Good  Fund of Knowledge:Fair  Language: Fair  Akathisia:  No  Handed:  Right  AIMS (if indicated):     Assets:  Desire for Improvement  Sleep:  Number of Hours: 6.75   Musculoskeletal: Strength & Muscle Tone: within normal limits Gait & Station: normal Patient leans: N/A  Current Medications: Current Facility-Administered Medications  Medication Dose Route Frequency Provider Last Rate Last Dose  . acetaminophen (TYLENOL) tablet 650 mg  650 mg Oral Q6H PRN Kerry HoughSpencer E Simon, PA-C   650 mg at 05/01/14 1650  . albuterol (PROVENTIL HFA;VENTOLIN HFA) 108 (90 BASE) MCG/ACT inhaler 2  puff  2 puff Inhalation Q4H PRN Kerry HoughSpencer E Simon, PA-C      . alum & mag hydroxide-simeth (MAALOX/MYLANTA) 200-200-20 MG/5ML suspension 30 mL  30 mL Oral Q4H PRN Kerry HoughSpencer E Simon, PA-C   30 mL at 04/30/14 1701  . buPROPion (WELLBUTRIN XL) 24 hr tablet 150 mg  150 mg Oral Daily Kerry HoughSpencer E Simon, PA-C   150 mg at 05/02/14 0858  . DULoxetine (CYMBALTA) DR capsule 60 mg  60 mg Oral Daily Kerry HoughSpencer E Simon, PA-C   60 mg at  05/02/14 0858  . gabapentin (NEURONTIN) capsule 300 mg  300 mg Oral TID Kerry HoughSpencer E Simon, PA-C   300 mg at 05/02/14 1208  . HYDROcodone-acetaminophen (NORCO/VICODIN) 5-325 MG per tablet 2 tablet  2 tablet Oral Q6H PRN Kerry HoughSpencer E Simon, PA-C      . hydrOXYzine (ATARAX/VISTARIL) tablet 25 mg  25 mg Oral Q6H PRN Kerry HoughSpencer E Simon, PA-C      . magnesium hydroxide (MILK OF MAGNESIA) suspension 30 mL  30 mL Oral Daily PRN Kerry HoughSpencer E Simon, PA-C      . QUEtiapine (SEROQUEL) tablet 300 mg  300 mg Oral QHS Kerry HoughSpencer E Simon, PA-C   300 mg at 05/01/14 2124  . traZODone (DESYREL) tablet 200 mg  200 mg Oral QHS Sanjuana KavaAgnes I Nwoko, NP   200 mg at 05/01/14 2124    Lab Results:  No results found for this or any previous visit (from the past 48 hour(s)).  Physical Findings: AIMS: Facial and Oral Movements Muscles of Facial Expression: None, normal Lips and Perioral Area: None, normal Jaw: None, normal Tongue: None, normal,Extremity Movements Upper (arms, wrists, hands, fingers): None, normal Lower (legs, knees, ankles, toes): None, normal, Trunk Movements Neck, shoulders, hips: None, normal, Overall Severity Severity of abnormal movements (highest score from questions above): None, normal Incapacitation due to abnormal movements: None, normal Patient's awareness of abnormal movements (rate only patient's report): No Awareness, Dental Status Current problems with teeth and/or dentures?: No Does patient usually wear dentures?: No  CIWA:  CIWA-Ar Total: 1 COWS:  COWS Total Score: 1  Treatment Plan Summary: Daily contact with patient to assess and evaluate symptoms and progress in treatment Medication management  Plan:  1. Continue crisis management and stabilization.  2. Medication management: Continue current treatment plan. 3. Encouraged patient to attend groups and participate in group counseling sessions and activities.  4. Discharge plan in progress.   5. Address health issues: Vitals reviewed and stable.    Medical Decision Making Problem Points:  Established problem, stable/improving (1), Review of last therapy session (1) and Review of psycho-social stressors (1) Data Points:  Review of medication regiment & side effects (2) Review of new medications or change in dosage (2)  I certify that inpatient services furnished can reasonably be expected to improve the patient's condition.   Armandina Stammerwoko, Agnes I, PMHNP-BC 05/02/2014, 12:11 PM  I agreed with the findings, treatment and disposition plan of this patient. Kathryne SharperSyed Zellie Jenning, MD

## 2014-05-02 NOTE — Progress Notes (Signed)
D) Pt states "I have just been having a hard time. Everyone I know acts like a kid. There is too much drama in this town. I just want to leave and start over somewhere else". Affect is flat but will brighten with interaction. Goes to his room and will sleep often instead of interacting with his peers. Rates his depression at an 8, hopelessness at a 7 and anxiety at a 7. Denies SI and HI A) Given support, reassurance and praise. Encouraged to attend the groups and to continue to process through his feelings. Asked to Clinical research associatewriter out a list of the positive things that are here in [place] for him. R) Pt out in the milieu for brief periods of time. Otherwise in his room resting

## 2014-05-03 NOTE — [facility] Group Notes (Signed)
[facility] LCSW Group Therapy  05/03/2014   10:00 AM   Type of Therapy:  Group Therapy  Participation Level:  Active  Participation Quality:  Appropriate and Attentive  Affect:  Appropriate, Flat and Depressed  Cognitive:  Alert and Appropriate  Insight:  Developing/Improving and Engaged  Engagement in Therapy:  Developing/Improving and Engaged  Modes of Intervention:  Clarification, Confrontation, Discussion, Education, Exploration, Limit-setting, Orientation, Problem-solving, Rapport Building, Dance movement psychotherapisteality Testing, Socialization and Support  Summary of Progress/Problems: The main focus of today's process group was to identify the patient's current support system and decide on other supports that can be put in place.  An emphasis was placed on using counselor, doctor, therapy groups, 12-step groups, and problem-specific support groups to expand supports, as well as doing something different than has been done before. Pt shared that he has no support system and doesn't care.  Pt was open to listening to others about why support is important and how pt can work on creating support outside of family/friends.  Pt actively listened and engaged in group discussion.    Pt asked CSW after group about another pt applying for [facility]ATS program, as he is interested in this as well and would like an application.  Pt states that he is homeless with no insurance, which qualifies him for [facility]ATS but that CSW has not been working with him so was unsure of what week day CSW has been working on with pt.  CSW provided pt with a BATS application but advised pt to talk to week day CSW about this.     Reyes IvanChelsea Horton, LCSW 05/03/2014 10:47 AM

## 2014-05-03 NOTE — Progress Notes (Signed)
D) Pt. Has been attending the program and interacting with his peers. Became upset when another male Pt was cursing at the staff. Started yelling at the other Pt but was able to calm with verbal intervention. Pt rates his depression at a 7, hopelessness at a 7, and his anxiety at a 7. Admits to thoughts of SI. States he is feeling a little better overall but still believes that he wants "to move away from all the drama" A) given support, reassurance and praise for being able to control his tempter. Encouraged to work on his issues and think about a place that he might be able to move to where he could get some support. Verbal contract made with Pt for his safety on the unit. R) Pt admits to thoughts of SI but contracts for safety.

## 2014-05-03 NOTE — Progress Notes (Signed)
Patient ID: [name] [name] Molinaro, male   DOB: 12/26/1980, 33 y.o.   MRN: [id number] Patient ID: [name] [name] Mcquary, male   DOB: 06/14/1980, 33 y.o.   MRN: [id number] Patient ID: [name] [name] Cureton, male   DOB: 07/26/1980, 33 y.o.   MRN: [id number] [facility]  05/03/2014 3:11 PM [name] [name] Basurto  MRN:  [id number]  Subjective:  Cristal DeerChristopher says he is still stressed out trying to figure out what to do after discharge.  O: Patient seen, chart reviewed.  He is visible on the unit, participating in group sessions. Denies any SIHI, AVH, delusional thoughts and or paranoia.  Diagnosis:   DSM5: Schizophrenia Disorders:  NA Obsessive-Compulsive Disorders:  NA Trauma-Stressor Disorders:  NA Substance/Addictive Disorders:  Hx. Alcohol dependence Depressive Disorders:  Major Depressive Disorder - Severe (296.23) Total Time spent with patient: 35 minutes  Axis I: MDD, recurrent episodes, severe Axis II: Deferred Axis III:  Past Medical History  Diagnosis Date  . Mental disorder   . Hypertension   . Depression   . Bipolar 1 disorder   . Irregular heart rate   . Asthma    Axis IV: other psychosocial or environmental problems Axis V: 41-50 serious symptoms  ADL's:  Intact  Sleep: Fair  Appetite:  Fair  Suicidal Ideation:  Denies Homicidal Ideation:  Denies AEB (as evidenced by):  Psychiatric Specialty Exam: Physical Exam  Psychiatric: His speech is normal. Judgment and thought content normal. His mood appears anxious. His affect is not angry, not blunt, not labile and not inappropriate. Cognition and memory are normal. He exhibits a depressed mood.    Review of Systems  Constitutional: Negative.   HENT: Negative.   Eyes: Negative.   Cardiovascular: Negative.   Gastrointestinal: Negative.   Genitourinary: Negative.   Skin: Negative.   Neurological: Negative.   Endo/Heme/Allergies: Negative.   Psychiatric/Behavioral: Positive for depression, hallucinations and  substance abuse (Alcoholism). Negative for suicidal ideas and memory loss. The patient is nervous/anxious and has insomnia.     Blood pressure 94/59, pulse 120, temperature 97.6 F (36.4 C), temperature source Oral, resp. rate 18, height 5\' 10"  (1.778 [name]), weight 122.471 kg (270 lb), SpO2 98 %.Body mass index is 38.74 kg/([name]^2).  General Appearance: Casual  Eye Contact::  Good  Speech:  Clear and Coherent  Volume:  Normal  Mood:  Improving  Affect:  Appropriate  Thought Process:  Coherent  Orientation:  Full (Time, Place, and Person)  Thought Content:  Rumination  Suicidal Thoughts:  No   Homicidal Thoughts:  No  Memory:  Negative NA Immediate;   Good Recent;   Good Remote;   Good  Judgement:  Fair  Insight:  Present  Psychomotor Activity:  Normal  Concentration:  Fair  Recall:  Good  Fund of Knowledge:Fair  Language: Fair  Akathisia:  No  Handed:  Right  AIMS (if indicated):     Assets:  Desire for Improvement  Sleep:  Number of Hours: 6.5   Musculoskeletal: Strength & Muscle Tone: within normal limits Gait & Station: normal Patient leans: N/A  Current Medications: Current Facility-Administered Medications  Medication Dose Route Frequency Provider Last Rate Last Dose  . acetaminophen (TYLENOL) tablet 650 mg  650 mg Oral Q6H PRN Kerry HoughSpencer E Simon, PA-C   650 mg at 05/03/14 0846  . albuterol (PROVENTIL HFA;VENTOLIN HFA) 108 (90 BASE) MCG/ACT inhaler 2 puff  2 puff Inhalation Q4H PRN Kerry HoughSpencer E Simon, PA-C      . alum & mag hydroxide-simeth (  MAALOX/MYLANTA) 200-200-20 MG/5ML suspension 30 mL  30 mL Oral Q4H PRN Kerry HoughSpencer E Simon, PA-C   30 mL at 04/30/14 1701  . buPROPion (WELLBUTRIN XL) 24 hr tablet 150 mg  150 mg Oral Daily Kerry HoughSpencer E Simon, PA-C   150 mg at 05/03/14 0805  . DULoxetine (CYMBALTA) DR capsule 60 mg  60 mg Oral Daily Kerry HoughSpencer E Simon, PA-C   60 mg at 05/03/14 0805  . gabapentin (NEURONTIN) capsule 300 mg  300 mg Oral TID Kerry HoughSpencer E Simon, PA-C   300 mg at 05/03/14  1214  . HYDROcodone-acetaminophen (NORCO/VICODIN) 5-325 MG per tablet 2 tablet  2 tablet Oral Q6H PRN Kerry HoughSpencer E Simon, PA-C      . hydrOXYzine (ATARAX/VISTARIL) tablet 25 mg  25 mg Oral Q6H PRN Kerry HoughSpencer E Simon, PA-C      . magnesium hydroxide (MILK OF MAGNESIA) suspension 30 mL  30 mL Oral Daily PRN Kerry HoughSpencer E Simon, PA-C      . QUEtiapine (SEROQUEL) tablet 300 mg  300 mg Oral QHS Kerry HoughSpencer E Simon, PA-C   300 mg at 05/02/14 2133  . traZODone (DESYREL) tablet 200 mg  200 mg Oral QHS Sanjuana KavaAgnes I Nwoko, NP   200 mg at 05/02/14 2133    Lab Results:  No results found for this or any previous visit (from the past 48 hour(s)).  Physical Findings: AIMS: Facial and Oral Movements Muscles of Facial Expression: None, normal Lips and Perioral Area: None, normal Jaw: None, normal Tongue: None, normal,Extremity Movements Upper (arms, wrists, hands, fingers): None, normal Lower (legs, knees, ankles, toes): None, normal, Trunk Movements Neck, shoulders, hips: None, normal, Overall Severity Severity of abnormal movements (highest score from questions above): None, normal Incapacitation due to abnormal movements: None, normal Patient's awareness of abnormal movements (rate only patient's report): No Awareness, Dental Status Current problems with teeth and/or dentures?: No Does patient usually wear dentures?: No  CIWA:  CIWA-Ar Total: 1 COWS:  COWS Total Score: 1  Treatment Plan Summary: Daily contact with patient to assess and evaluate symptoms and progress in treatment Medication management  Plan:  1. Continue crisis management and stabilization.  2. Medication management: Continue current treatment plan. 3. Encouraged patient to attend groups and participate in group counseling sessions and activities.  4. Discharge plan in progress, would like to talk with SW in am pertaining to his discharge.   5. Address health issues: Vitals reviewed and stable.   Medical Decision Making Problem Points:   Established problem, stable/improving (1), Review of last therapy session (1) and Review of psycho-social stressors (1) Data Points:  Review of medication regiment & side effects (2) Review of new medications or change in dosage (2)  I certify that inpatient services furnished can reasonably be expected to improve the patient's condition.   Armandina Stammerwoko, Agnes I, PMHNP-BC 05/03/2014, 3:11 PM  I agreed with the findings, treatment and disposition plan of this patient. Kathryne SharperSyed Ariahna Smiddy, MD

## 2014-05-03 NOTE — [facility] Group Notes (Signed)
The focus of this group is to educate the patient on the purpose and policies of crisis stabilization and provide a format to answer questions about their admission.  The group details unit policies and expectations of patients while admitted.  Patient attended 0900 nurse education orientation group this morning.  Patient actively participated, appropriate affect, alert, appropriate insight and engagement.  Today patient will work on 3 goals for discharge.  

## 2014-05-04 MED ORDER — BUPROPION HCL ER (XL) 300 MG PO TB24
300.0000 mg | ORAL_TABLET | Freq: Every day | ORAL | Status: DC
  Administered 2014-05-05 – 2014-05-06 (×2): 300 mg via ORAL
  Filled 2014-05-04 (×4): qty 1

## 2014-05-04 MED ORDER — TRAZODONE HCL 100 MG PO TABS
200.0000 mg | ORAL_TABLET | Freq: Every day | ORAL | Status: DC
  Filled 2014-05-04: qty 2

## 2014-05-04 MED ORDER — BUPROPION HCL ER (XL) 300 MG PO TB24
300.0000 mg | ORAL_TABLET | Freq: Every day | ORAL | Status: DC
  Filled 2014-05-04: qty 14

## 2014-05-04 MED ORDER — GABAPENTIN 300 MG PO CAPS
300.0000 mg | ORAL_CAPSULE | Freq: Three times a day (TID) | ORAL | Status: DC
  Filled 2014-05-04: qty 42

## 2014-05-04 MED ORDER — QUETIAPINE FUMARATE 300 MG PO TABS
300.0000 mg | ORAL_TABLET | Freq: Every day | ORAL | Status: DC
  Filled 2014-05-04: qty 1

## 2014-05-04 MED ORDER — QUETIAPINE FUMARATE 300 MG PO TABS
300.0000 mg | ORAL_TABLET | Freq: Every day | ORAL | Status: DC
  Administered 2014-05-04 – 2014-05-05 (×2): 300 mg via ORAL
  Filled 2014-05-04 (×3): qty 14

## 2014-05-04 MED ORDER — DULOXETINE HCL 60 MG PO CPEP
60.0000 mg | ORAL_CAPSULE | Freq: Every day | ORAL | Status: DC
  Filled 2014-05-04: qty 14

## 2014-05-04 MED ORDER — TRAZODONE HCL 100 MG PO TABS
200.0000 mg | ORAL_TABLET | Freq: Every day | ORAL | Status: DC
  Administered 2014-05-04 – 2014-05-05 (×2): 200 mg via ORAL
  Filled 2014-05-04 (×3): qty 28

## 2014-05-04 NOTE — [facility] Group Notes (Signed)
[facility] LCSW Group Therapy 05/04/2014  1:15 pm  Type of Therapy: Group Therapy Participation Level: Active  Participation Quality: Attentive, Sharing and Supportive  Affect: Depressed and Flat  Cognitive: Alert and Oriented  Insight: Developing/Improving and Engaged  Engagement in Therapy: Developing/Improving and Engaged  Modes of Intervention: Clarification, Confrontation, Discussion, Education, Exploration,  Limit-setting, Orientation, Problem-solving, Rapport Building, Dance movement psychotherapisteality Testing, Socialization and Support  Summary of Progress/Problems: Pt identified obstacles faced currently and processed barriers involved in overcoming these obstacles. Pt identified steps necessary for overcoming these obstacles and explored motivation (internal and external) for facing these difficulties head on. Pt further identified one area of concern in their lives and chose a goal to focus on for today. Patient identified negative family influences as an obstacle. CSW and group discussed importance of establishing boundaries with friends and family at times to focus on recover. CSW and other group members provided patient with emotional support and encouragement.  Samuella BruinKristin Hayzlee Mcsorley, MSW, [employer]CSWA Clinical Social Worker [facility] [phone]

## 2014-05-04 NOTE — Progress Notes (Signed)
[facility]  05/04/2014 3:28 PM [name] [name]  MRN:  [id number] Subjective:  States the depression had persisted even after he left here last time and had a stable place to live. . States the depression gets so bad that he starts thinking about suicide. He claims he had shared this with [facility] but that they did not do "anything about it." They kept the same medication same dosage. He claims compliance with it. He is concerned about leaving feeling this way as pretty much he feels that if we don't help him here the outpatient providers will not want to change anything  Diagnosis:   DSM5: Substance/Addictive Disorders:  Alcohol Related Disorder - Moderate (303.90) Depressive Disorders:  Major Depressive Disorder - Severe (296.23) Total Time spent with patient: 30 minutes  Axis I: Generalized Anxiety Disorder  ADL's:  Intact  Sleep: Poor  Appetite:  Fair Psychiatric Specialty Exam: Physical Exam  Review of Systems  Constitutional: Positive for malaise/fatigue.  HENT: Negative.   Eyes: Negative.   Respiratory: Negative.   Cardiovascular: Negative.   Gastrointestinal: Negative.   Genitourinary: Negative.   Musculoskeletal: Negative.   Skin: Negative.   Neurological: Negative.   Endo/Heme/Allergies: Negative.   Psychiatric/Behavioral: Positive for depression and substance abuse. The patient is nervous/anxious and has insomnia.     Blood pressure 113/74, pulse 88, temperature 97.8 F (36.6 C), temperature source Oral, resp. rate 18, height 5\' 10"  (1.778 m), weight 122.471 kg (270 lb), SpO2 98 %.Body mass index is 38.74 kg/(m^2).  General Appearance: Disheveled  Eye SolicitorContact::  Fair  Speech:  Clear and Coherent  Volume:  Normal  Mood:  Anxious, Depressed and worried  Affect:  anxious worried  Thought Process:  Coherent and Goal Directed  Orientation:  Full (Time, Place, and Person)  Thought Content:  symptoms events worries concerns   Suicidal Thoughts:  Intermittent  Homicidal Thoughts:  No  Memory:  Immediate;   Fair Recent;   Fair Remote;   Fair  Judgement:  Fair  Insight:  Present and Shallow  Psychomotor Activity:  Restlessness  Concentration:  Fair  Recall:  [employer]Fair  Fund of Knowledge:NA  Language: Fair  Akathisia:  No  Handed:    AIMS (if indicated):     Assets:  Desire for Improvement  Sleep:  Number of Hours: 5.75   Musculoskeletal: Strength & Muscle Tone: within normal limits Gait & Station: normal Patient leans: N/A  Current Medications: Current Facility-Administered Medications  Medication Dose Route Frequency Provider Last Rate Last Dose  . acetaminophen (TYLENOL) tablet 650 mg  650 mg Oral Q6H PRN Kerry HoughSpencer E Simon, PA-C   650 mg at 05/03/14 2158  . albuterol (PROVENTIL HFA;VENTOLIN HFA) 108 (90 BASE) MCG/ACT inhaler 2 puff  2 puff Inhalation Q4H PRN Kerry HoughSpencer E Simon, PA-C      . alum & mag hydroxide-simeth (MAALOX/MYLANTA) 200-200-20 MG/5ML suspension 30 mL  30 mL Oral Q4H PRN Kerry HoughSpencer E Simon, PA-C   30 mL at 04/30/14 1701  . [START ON 05/05/2014] buPROPion (WELLBUTRIN XL) 24 hr tablet 300 mg  300 mg Oral Daily Rachael FeeIrving A Dyonna Jaspers, MD      . DULoxetine (CYMBALTA) DR capsule 60 mg  60 mg Oral Daily Kerry HoughSpencer E Simon, PA-C   60 mg at 05/04/14 0810  . gabapentin (NEURONTIN) capsule 300 mg  300 mg Oral TID Kerry HoughSpencer E Simon, PA-C   300 mg at 05/04/14 1322  . HYDROcodone-acetaminophen (NORCO/VICODIN) 5-325 MG per tablet 2 tablet  2 tablet Oral Q6H PRN Kerry HoughSpencer E Simon, PA-C      . hydrOXYzine (ATARAX/VISTARIL) tablet 25 mg  25 mg Oral Q6H PRN Kerry HoughSpencer E Simon, PA-C   25 mg at 05/03/14 2157  . magnesium hydroxide (MILK OF MAGNESIA) suspension 30 mL  30 mL Oral Daily PRN Kerry HoughSpencer E Simon, PA-C      . QUEtiapine (SEROQUEL) tablet 300 mg  300 mg Oral QHS Rockey SituFernando A Cobos, MD      . traZODone (DESYREL) tablet 200 mg  200 mg Oral QHS Craige CottaFernando A Cobos, MD        Lab Results: No results found for this or any previous visit  (from the past 48 hour(s)).  Physical Findings: AIMS: Facial and Oral Movements Muscles of Facial Expression: None, normal Lips and Perioral Area: None, normal Jaw: None, normal Tongue: None, normal,Extremity Movements Upper (arms, wrists, hands, fingers): None, normal Lower (legs, knees, ankles, toes): None, normal, Trunk Movements Neck, shoulders, hips: None, normal, Overall Severity Severity of abnormal movements (highest score from questions above): None, normal Incapacitation due to abnormal movements: None, normal Patient's awareness of abnormal movements (rate only patient's report): No Awareness, Dental Status Current problems with teeth and/or dentures?: No Does patient usually wear dentures?: No  CIWA:  CIWA-Ar Total: 1 COWS:  COWS Total Score: 2  Treatment Plan Summary: Daily contact with patient to assess and evaluate symptoms and progress in treatment Medication management  Plan: Supportive approach/coping skills/relapse prevention           CBT;mindfulness           Will increase the Wellbutrin to XL 300 mg in AM           Explore placement options Medical Decision Making Problem Points:  Established problem, worsening (2) and Review of psycho-social stressors (1) Data Points:  Review of medication regiment & side effects (2) Review of new medications or change in dosage (2)  I certify that inpatient services furnished can reasonably be expected to improve the patient's condition.   Loghan Kurtzman A 05/04/2014, 3:28 PM

## 2014-05-04 NOTE — Progress Notes (Signed)
Adult Psychoeducational Group Note  Date:  05/04/2014 Time:  9:36 PM  Group Topic/Focus:  Wrap-Up Group:   The focus of this group is to help patients review their daily goal of treatment and discuss progress on daily workbooks.  Participation Level:  Active  Participation Quality:  Appropriate  Affect:  Appropriate  Cognitive:  Appropriate  Insight: Good  Engagement in Group:  Engaged  Modes of Intervention:  Discussion  Additional Comments:  Pt accomplish his goal by speaking with his case worker.  Casilda CarlsKELLY, Kinnick Maus H 05/04/2014, 9:36 PM

## 2014-05-04 NOTE — [facility] Group Notes (Signed)
   [facility] LCSW Aftercare Discharge Planning Group Note  05/04/2014  8:45 AM   Participation Quality: Alert, Appropriate and Oriented  Mood/Affect: Depressed and Flat  Depression Rating: 8  Anxiety Rating: 8  Thoughts of Suicide: Pt denies SI/HI  Will you contract for safety? Yes  Current AVH: Pt denies  Plan for Discharge/Comments: Pt attended discharge planning group and actively participated in group. CSW provided pt with today's workbook. Patient reports feeling "so/so" today. He reports wanting [place] go [place] in [place] if he can assistance with transportation. He is also agreeable to a shelter.  Transportation Means: Pt is requesting assistance with transportation  Supports: No supports mentioned at this time  Samuella BruinKristin Anahlia Iseminger, MSW, [employer] Clinical Social Worker [facility] [phone]

## 2014-05-04 NOTE — Progress Notes (Signed)
Patient ID: [name] [name], male   DOB: 12/11/1980, 33 y.o.   MRN: [id number] D- Patient alert and oriented. Patient is depressed and anxious. Denies AVH. Patient complains of thoughts of suicide and states that he does not think that he can remain safe if he is discharged. Patient reports a headache.  Medication given (See Mar).  Around 2300, patient had a verbal altercation with another patient on the unit where he verbalized threatening remarks.  Patient retreated to his room and remained there through the night.   A- Support and encouragement provided.  Patient is encouraged to attend all groups and activities provided. Routine safety checks conducted every 15 minutes.  Patient informed to notify staff with problems or concerns. R- Patient verbally contracts for safety and said that he would notify staff if he had any thoughts of harming self or others.  Safety maintained on the unit.

## 2014-05-04 NOTE — Progress Notes (Signed)
D Pt. Continues to report passive SI stating he is not ready to discharge, denies HI, no complaints of pain or discomfort noted.  A Writer offered support and encouragement,   R Pt. Remains safe on the unit.  Pt. Was out in the milieu this pm laughing and interacting with his peers and staff.  Pt. Still is hoping to relocate to [place] but does not have the financial means.

## 2014-05-04 NOTE — Plan of Care (Signed)
Problem: Consults Goal: Depression Patient Education See Patient Education Module for education specifics.  Outcome: Completed/Met Date Met:  05/04/14 Nurse talked to patient about depression.

## 2014-05-04 NOTE — Progress Notes (Signed)
D:  Patient's self inventory sheet, patient has poor sleep, sleep medication was not helpful.  Fair appetite, low energy level, good concentration.  Rated depression, hopeless and anxiety #8.  Denied withdrawals.  SI, contracts for safety.  Physical problems, pain rated #7 worst in past 24 hours.  Pain medication is helpful.  Does have discharge plans.  Unsure of discharge plans. A:  Medications administered per MD orders.  Emotional support and encouragement given patient. R:  Denied HI.  Denied A/V hallucinations.  Patient wrote SI on self inventory sheet, but denied SI while talking to nurse.  Contracts for safety.

## 2014-05-05 MED ORDER — TRAZODONE HCL 100 MG PO TABS: 200.0000 mg | ORAL_TABLET | Freq: Every day | ORAL | Status: AC

## 2014-05-05 MED ORDER — GABAPENTIN 300 MG PO CAPS: 300.0000 mg | ORAL_CAPSULE | Freq: Three times a day (TID) | ORAL | Status: AC

## 2014-05-05 MED ORDER — LIDOCAINE 5 % EX PTCH: 1.0000 | MEDICATED_PATCH | Freq: Every day | CUTANEOUS | Status: AC

## 2014-05-05 MED ORDER — DULOXETINE HCL 60 MG PO CPEP: 60.0000 mg | ORAL_CAPSULE | Freq: Every day | ORAL | Status: AC

## 2014-05-05 MED ORDER — QUETIAPINE FUMARATE 300 MG PO TABS: 300.0000 mg | ORAL_TABLET | Freq: Every day | ORAL | Status: AC

## 2014-05-05 MED ORDER — LIDOCAINE 5 % EX PTCH
1.0000 | MEDICATED_PATCH | Freq: Every day | CUTANEOUS | Status: DC
  Administered 2014-05-05 – 2014-05-06 (×2): 1 via TRANSDERMAL
  Filled 2014-05-05 (×3): qty 1

## 2014-05-05 MED ORDER — BUPROPION HCL ER (XL) 300 MG PO TB24: 300.0000 mg | ORAL_TABLET | Freq: Every day | ORAL | Status: AC

## 2014-05-05 NOTE — Plan of Care (Signed)
Problem: Ineffective individual coping Goal: STG: Patient will remain free from self harm Outcome: Completed/Met Date Met:  05/05/14

## 2014-05-05 NOTE — Progress Notes (Signed)
Patient ID: [name] [name], male   DOB: 09/21/1980, 33 y.o.   MRN: [id number]  D: Pt currently presents with a depressed affect and behavior. Per self inventory, pt rates depression at a 7, hopelessness 7 and anxiety 7. Pt's daily goal is to "to get home." Pt reports poor sleep but good concentration and appetite. Pt reports that he had thoughts os SI "this morning when I woke up I had one but I don't feel that way anymore."    A: Pt provided with medications per providers orders. Pt's labs and vitals were monitored throughout the day. Pt supported emotionally and encouraged to express concerns and questions. Pt consulted with provider and social worker. Pt educated on medication regimen and alternative sleep aids.    R: Pt's safety ensured with 15 minute and environmental checks. Pt currently denies SI/HI and A/V hallucinations. Pt verbally agrees to seek staff if SI/HI or A/VH occurs and to consult with staff before acting on these thoughts. Pt is smiling and laughing in the dayroom. 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    Aurora Maskwyman, [name] Vanhandel E, RN

## 2014-05-05 NOTE — [facility] Group Notes (Signed)
[facility] LCSW Group Therapy  05/05/2014   1:15 PM   Type of Therapy:  Group Therapy  Participation Level:  Active  Participation Quality:  Attentive, Sharing and Supportive  Affect:  Depressed and Flat  Cognitive:  Alert and Oriented  Insight:  Developing/Improving and Engaged  Engagement in Therapy:  Developing/Improving and Engaged  Modes of Intervention:  Clarification, Confrontation, Discussion, Education, Exploration, Limit-setting, Orientation, Problem-solving, Rapport Building, Dance movement psychotherapisteality Testing, Socialization and Support  Summary of Progress/Problems: The topic for group therapy was feelings about diagnosis.  Pt actively participated in group discussion on their past and current diagnosis and how they feel towards this.  Pt also identified how society and family members judge them, based on their diagnosis as well as stereotypes and stigmas.  Patient discussed that his mental health diagnosis "changed things for the good and bad". Patient discussed losing friends and supports due to his mental health diagnosis and related stigma. Patient maintains that he keeps a positive attitude despite these obstacles and identifies his goals as to get a job and a house. CSW's and other group members provided emotional support and encouragement.  Samuella BruinKristin Nely Dedmon, MSW, [employer] Clinical Social Worker [facility] [phone]

## 2014-05-05 NOTE — Discharge Summary (Addendum)
Physician Discharge Summary Note  Patient:  [name] [name] is an 33 y.o., male MRN:  [id number] DOB:  06-08-1980 Patient phone:  [phone] (home)  Patient address:   [address] [place] [address],   Date of Admission:  04/29/2014 Date of Discharge:  05/06/2014  Reason for Admission: Depression   Discharge Diagnoses: Active Problems:   MDD (major depressive disorder), recurrent episode, severe   Severe recurrent major depressive disorder with psychotic features  Review of Systems  Constitutional: Negative.  Negative for fever, chills, weight loss, malaise/fatigue and diaphoresis.  HENT: Negative for congestion and sore throat.   Eyes: Negative for blurred vision, double vision and photophobia.  Respiratory: Negative for cough, shortness of breath and wheezing.   Cardiovascular: Negative for chest pain, palpitations and PND.  Gastrointestinal: Negative for heartburn, nausea, vomiting, abdominal pain, diarrhea and constipation.  Musculoskeletal: Negative for myalgias, joint pain and falls.  Neurological: Negative for dizziness, tingling, tremors, sensory change, speech change, focal weakness, seizures, loss of consciousness, weakness and headaches.  Endo/Heme/Allergies: Negative for polydipsia. Does not bruise/bleed easily.  Psychiatric/Behavioral: Positive for depression (Stabilized with treatment). Negative for suicidal ideas, hallucinations, memory loss and substance abuse. The patient is not nervous/anxious and does not have insomnia.     Discharge Diagnoses:  AXIS I: Major Depression recurrent severe   AXIS II: No diagnosis  AXIS III:  Past Medical History   Diagnosis  Date   .  Mental disorder    .  Hypertension    .  Depression    .  Bipolar 1 disorder    .  Irregular heart rate    .  Asthma     AXIS IV: other psychosocial or environmental problems  AXIS V: 61-70 mild symptoms  Musculoskeletal:  Strength & Muscle Tone: within normal limits   Gait & Station: normal  Patient leans: N/A  Level of Care: OP Keno Caraway is a 33 year old male who states that after he left from here last time, he did not go to the [facility]. States he went back with his sister. Things did not work out. He experienced worsening of the depression. He has not been able to find a job, there is increased conflict with her sisters' boyfriend, he is still grieving the death of his mother. He started having thoughts of suicide, decided to get some help. He denies having relapsed on his use of alcoholThe assessment from the ED is as follows; [name] [name] is a 33 y.o. male who voluntarily presents to [facility] with SI thoughts, depression and anxiety. Pt reports that he's been SI x 72 hrs w/nospecific plan to harm himself. Pt states his current mental state was triggered by: (1) mother died 3 yrs ago; (2) unemployment; (3) financial and (4) feeling like a burden to his sister, because he lives with her. Pt says he hasn't slept in 3 days and cannot eat. Pt says he is hearing his mother's voice--laughing and talking. Pt says he is not productive and thinks people are making fun of him and talking about him behind his back--"I wish I was with my mom". Pt denies HI/SA. Pt endorses depressive sxs: crying spells, isolation, and anhedonia.          [name] [name] was admitted to the adult unit. He was evaluated and his symptoms were identified. Medication management was discussed and initiated. His Wellbutrin XL was increased to 300 mg daily to help treat his depressive symptoms.  His Trazodone was increased to 200 mg at bedtime for treatment of insomnia. His home medications of Cymbalta, Neurontin, and Seroquel were all continued.  He was oriented to the unit and encouraged to participate in unit programming. Medical problems were identified and treated appropriately. Home medication was restarted as needed.        The patient was evaluated each day by a  clinical provider to ascertain the patient's response to treatment.  Improvement was noted by the patient's report of decreasing symptoms, improved sleep and appetite, affect, medication tolerance, behavior, and participation in unit programming.  He was asked each day to complete a self inventory noting mood, mental status, pain, new symptoms, anxiety and concerns.         He responded well to medication and being in a therapeutic and supportive environment. Positive and appropriate behavior was noted and the patient was motivated for recovery.  The patient worked closely with the treatment team and case manager to develop a discharge plan with appropriate goals. Coping skills, problem solving as well as relaxation therapies were also part of the unit programming. Patient expressed excitement about going to [place] to stay with Uncle. He reported that his [name] [name] is willing to give him a place to stay.          By the day of discharge he was in much improved condition than upon admission.  Symptoms of depression were reported as significantly decreased.The patient denied SI/HI and voiced no AVH. He was motivated to continue taking medication with a goal of continued improvement in mental health.  [name] [name] was discharged home with a plan to follow up as noted below. Patient was provided with prescriptions and sample medications at time of discharge. He left [facility] in stable condition with all belongings returned to him. Patient received a [employer] [place].   Consults:  None  Significant Diagnostic Studies:  None  Discharge Vitals:   Blood pressure 128/81, pulse 93, temperature 97.8 F (36.6 C), temperature source Oral, resp. rate 18, height 5\' 10"  (1.778 m), weight 122.471 kg (270 lb), SpO2 98 %. Body mass index is 38.74 kg/(m^2). Lab Results:   No results found for this or any previous visit (from the past 72 hour(s)).  Physical Findings: AIMS:  Facial and Oral Movements Muscles of Facial Expression: None, normal Lips and Perioral Area: None, normal Jaw: None, normal Tongue: None, normal,Extremity Movements Upper (arms, wrists, hands, fingers): None, normal Lower (legs, knees, ankles, toes): None, normal, Trunk Movements Neck, shoulders, hips: None, normal, Overall Severity Severity of abnormal movements (highest score from questions above): None, normal Incapacitation due to abnormal movements: None, normal Patient's awareness of abnormal movements (rate only patient's report): No Awareness, Dental Status Current problems with teeth and/or dentures?: No Does patient usually wear dentures?: No  CIWA:  CIWA-Ar Total: 1 COWS:  COWS Total Score: 2  Psychiatric Specialty Exam: See Psychiatric Specialty Exam and Suicide Risk Assessment completed by Attending Physician prior to discharge.  Discharge destination:  Home  Is patient on multiple antipsychotic therapies at discharge:  No   Has Patient had three or more failed trials of antipsychotic monotherapy by history:  No  Recommended Plan for Multiple Antipsychotic Therapies: NA     Medication List    STOP taking these medications        promethazine 12.5 MG tablet  Commonly known as:  PHENERGAN      TAKE these  medications      Indication   albuterol 108 (90 BASE) MCG/ACT inhaler  Commonly known as:  PROVENTIL HFA;VENTOLIN HFA  Inhale 2 puffs into the lungs every 4 (four) hours as needed for wheezing or shortness of breath.      aspirin-acetaminophen-caffeine 250-250-65 MG per tablet  Commonly known as:  EXCEDRIN MIGRAINE  Take 3 tablets by mouth daily as needed for headache or migraine.      buPROPion 300 MG 24 hr tablet  Commonly known as:  WELLBUTRIN XL  Take 1 tablet (300 mg total) by mouth daily.   Indication:  Major Depressive Disorder     DULoxetine 60 MG capsule  Commonly known as:  CYMBALTA  Take 1 capsule (60 mg total) by mouth daily.   Indication:   Major Depressive Disorder     gabapentin 300 MG capsule  Commonly known as:  NEURONTIN  Take 1 capsule (300 mg total) by mouth 3 (three) times daily.   Indication:  Mood stabilization     HYDROcodone-acetaminophen 5-325 MG per tablet  Commonly known as:  NORCO/VICODIN  Take 1-2 tablets every 6 hours as needed for severe pain      hydrOXYzine 25 MG tablet  Commonly known as:  ATARAX/VISTARIL  Take 25 mg by mouth every 6 (six) hours as needed for anxiety.      lidocaine 5 %  Commonly known as:  LIDODERM  Place 1 patch onto the skin daily. Remove & Discard patch within 12 hours or as directed by MD and leave off for 12 hours   Indication:  pain     QUEtiapine 300 MG tablet  Commonly known as:  SEROQUEL  Take 1 tablet (300 mg total) by mouth at bedtime.   Indication:  Trouble Sleeping     traZODone 100 MG tablet  Commonly known as:  DESYREL  Take 2 tablets (200 mg total) by mouth at bedtime.   Indication:  Trouble Sleeping       Follow-up Information    [facility] with [facility].   Why:  Please contact [facility] Intake Department regarding scheduling medication management and therapy services at discharge.   Contact information:   [address],  [place], [place] [id number] [phone]     Follow-up recommendations:   Activities: Resume activity as tolerated. Diet: Heart healthy low sodium diet Tests: Follow up testing will be determined by your out patient provider.  Comments:   Take all medications as prescribed. Keep all follow-up appointments as scheduled.  Do not consume alcohol or use illegal drugs while on prescription medications. Report any adverse effects from your medications to your primary care provider promptly.  In the event of recurrent symptoms or worsening symptoms, call 911, a crisis hotline, or go to the nearest emergency department for evaluation.   Total Discharge Time:  Less than 30  minutes.  SignedFransisca Kaufmann: DAVIS, LAURA, NP-C 05/06/2014, 12:26 PM I personally assessed the patient and formulated the plan Madie RenoIrving A. Dub MikesLugo, M.D.

## 2014-05-05 NOTE — Progress Notes (Signed)
Recreation Therapy Notes  Animal-Assisted Activity/Therapy (AAA/T) Program Checklist/Progress Notes Patient Eligibility Criteria Checklist & Daily Group note for Rec Tx Intervention  Date: 12.01.2015 Time: 2:45pm Location: [address] Dayroom   AAA/T Program Assumption of Risk Form signed by Patient/ or Parent Legal Guardian yes  Patient is free of allergies or sever asthma yes  Patient reports no fear of animals yes  Patient reports no history of cruelty to animals yes  Patient understands his/her participation is voluntary yes  Patient washes hands before animal contact yes  Patient washes hands after animal contact yes  Behavioral Response: Appropriate   Education: Hand Washing, Appropriate Animal Interaction   Education Outcome: Acknowledges education.   Clinical Observations/Feedback: Patient interacted with therapy dog, petting him appropriately. Patient socialized with peers in session, conversation remained appropriate.   Marykay Lexenise L Ankith Edmonston, LRT/CTRS  Briyonna Omara L 05/05/2014 5:08 PM

## 2014-05-05 NOTE — [facility] Group Notes (Signed)
[facility] Group Notes:  (Nursing/MHT/Case Management/Adjunct)  Date:  05/05/2014  Time:  08:45  Type of Therapy:  Nurse Education  Participation Level:  Active  Participation Quality:  Appropriate  Affect:  Flat  Cognitive:  Appropriate  Insight:  Improving  Engagement in Group:  Improving  Modes of Intervention:  Discussion, Education, Orientation and Support  Summary of Progress/Problems: Pt reports that his goal in "depression recovery" is to get a job, take his medicines and go to [place] where he has support."  Aurora Maskwyman, Skyrah Krupp E 05/05/2014, 2:47 PM

## 2014-05-05 NOTE — Progress Notes (Signed)
[facility] MD Progress Note  05/05/2014 5:31 PM [name] [name]  MRN:  [id number] Subjective:  [name] [name] is tolerating the medications well. Expressed excitement of being able to go to [place]. States that he heard from an uncle who is willing to give him a place to stay and find him a job. He states this is what he was hoping for as he just needs some help to get back on his feet. States that taking the medications at 9 PM help a lot as he was able to sleep all night Diagnosis:   DSM5: Substance/Addictive Disorders:  Alcohol Related Disorder - Mild (305.00) Depressive Disorders:  Major Depressive Disorder - Severe (296.23) Total Time spent with patient: 30 minutes  Axis I: Generalized Anxiety Disorder  ADL's:  Intact  Sleep: Fair  Appetite:  Fair  Psychiatric Specialty Exam: Physical Exam  Review of Systems  Constitutional: Negative.   HENT: Negative.   Eyes: Negative.   Respiratory: Negative.   Cardiovascular: Negative.   Gastrointestinal: Negative.   Genitourinary: Negative.   Musculoskeletal: Negative.   Skin: Negative.   Neurological: Negative.   Endo/Heme/Allergies: Negative.   Psychiatric/Behavioral: Positive for depression. The patient is nervous/anxious.     Blood pressure 108/61, pulse 103, temperature 97.8 F (36.6 C), temperature source Oral, resp. rate 20, height 5\' 10"  (1.778 m), weight 122.471 kg (270 lb), SpO2 98 %.Body mass index is 38.74 kg/(m^2).  General Appearance: Fairly Groomed  Patent attorneyye Contact::  Fair  Speech:  Clear and Coherent  Volume:  fluctuates  Mood:  Anxious and worried  Affect:  anxious worried  Thought Process:  Coherent and Goal Directed  Orientation:  Full (Time, Place, and Person)  Thought Content:  events symptoms worries concerns  Suicidal Thoughts:  No  Homicidal Thoughts:  No  Memory:  Immediate;   Fair Recent;   Fair Remote;   Fair  Judgement:  Fair  Insight:  Present  Psychomotor Activity:  Restlessness  Concentration:  Fair   Recall:  Fis[employer]Fair  Fund of Knowledge:NA  Language: Fair  Akathisia:  No  Handed:    AIMS (if indicated):     Assets:  Desire for Improvement  Sleep:  Number of Hours: 6.75   Musculoskeletal: Strength & Muscle Tone: within normal limits Gait & Station: normal Patient leans: N/A  Current Medications: Current Facility-Administered Medications  Medication Dose Route Frequency Provider Last Rate Last Dose  . acetaminophen (TYLENOL) tablet 650 mg  650 mg Oral Q6H PRN Kerry HoughSpencer E Simon, PA-C   650 mg at 05/03/14 2158  . albuterol (PROVENTIL HFA;VENTOLIN HFA) 108 (90 BASE) MCG/ACT inhaler 2 puff  2 puff Inhalation Q4H PRN Kerry HoughSpencer E Simon, PA-C      . alum & mag hydroxide-simeth (MAALOX/MYLANTA) 200-200-20 MG/5ML suspension 30 mL  30 mL Oral Q4H PRN Kerry HoughSpencer E Simon, PA-C   30 mL at 05/05/14 1529  . buPROPion (WELLBUTRIN XL) 24 hr tablet 300 mg  300 mg Oral Daily Rachael FeeIrving A Ricki Clack, MD   300 mg at 05/05/14 0751  . DULoxetine (CYMBALTA) DR capsule 60 mg  60 mg Oral Daily Kerry HoughSpencer E Simon, PA-C   60 mg at 05/05/14 0751  . gabapentin (NEURONTIN) capsule 300 mg  300 mg Oral TID Kerry HoughSpencer E Simon, PA-C   300 mg at 05/05/14 1654  . HYDROcodone-acetaminophen (NORCO/VICODIN) 5-325 MG per tablet 2 tablet  2 tablet Oral Q6H PRN Kerry HoughSpencer E Simon, PA-C      . hydrOXYzine (ATARAX/VISTARIL) tablet 25 mg  25 mg Oral Q6H  PRN Kerry HoughSpencer E Simon, PA-C   25 mg at 05/03/14 2157  . lidocaine (LIDODERM) 5 % 1 patch  1 patch Transdermal Daily Rachael FeeIrving A Usama Harkless, MD   1 patch at 05/05/14 1305  . magnesium hydroxide (MILK OF MAGNESIA) suspension 30 mL  30 mL Oral Daily PRN Kerry HoughSpencer E Simon, PA-C      . QUEtiapine (SEROQUEL) tablet 300 mg  300 mg Oral QHS Craige CottaFernando A Cobos, MD   300 mg at 05/04/14 2154  . traZODone (DESYREL) tablet 200 mg  200 mg Oral QHS Craige CottaFernando A Cobos, MD   200 mg at 05/04/14 2154    Lab Results: No results found for this or any previous visit (from the past 48 hour(s)).  Physical Findings: AIMS: Facial and Oral  Movements Muscles of Facial Expression: None, normal Lips and Perioral Area: None, normal Jaw: None, normal Tongue: None, normal,Extremity Movements Upper (arms, wrists, hands, fingers): None, normal Lower (legs, knees, ankles, toes): None, normal, Trunk Movements Neck, shoulders, hips: None, normal, Overall Severity Severity of abnormal movements (highest score from questions above): None, normal Incapacitation due to abnormal movements: None, normal Patient's awareness of abnormal movements (rate only patient's report): No Awareness, Dental Status Current problems with teeth and/or dentures?: No Does patient usually wear dentures?: No  CIWA:  CIWA-Ar Total: 1 COWS:  COWS Total Score: 2  Treatment Plan Summary: Daily contact with patient to assess and evaluate symptoms and progress in treatment Medication management  Plan: Supportive approach/coping skills/relapse prevention           Pursue and optimize response to the psychotropics           ( he is tolerating the medications changes well so far)           Facilitate getting to [place]  Medical Decision Making Problem Points:  Review of psycho-social stressors (1) Data Points:  Review of medication regiment & side effects (2) Review of new medications or change in dosage (2)  I certify that inpatient services furnished can reasonably be expected to improve the patient's condition.   [name] [name] A 05/05/2014, 5:31 PM

## 2014-05-05 NOTE — Clinical Social Work Note (Signed)
[employer] [place] purchased for patient's discharge tomorrow 12/2.  CSW left voicemail for [facility] [address] Intake Department [phone] regarding scheduling mental health outpatient services for patient.  Samuella BruinKristin Derico Mitton, MSW, [employer] IncLCSWA Clinical Social Worker [facility] [phone]

## 2014-05-05 NOTE — Progress Notes (Signed)
Adult Psychoeducational Group Note  Date:  05/05/2014 Time:  10:53 PM  Group Topic/Focus:  Wrap-Up Group:   The focus of this group is to help patients review their daily goal of treatment and discuss progress on daily workbooks.  Participation Level:  Active  Participation Quality:  Appropriate  Affect:  Appropriate  Cognitive:  Appropriate  Insight: Good  Engagement in Group:  Engaged  Modes of Intervention:  Discussion  Additional Comments:  Pt stated he was excited about being d/c to a facility in [place] tomorrow.   Casilda CarlsKELLY, Maicie Vanderloop H 05/05/2014, 10:53 PM

## 2014-05-05 NOTE — Progress Notes (Signed)
Pt attended spiritual care group on grief and loss facilitated by counseling intern [place] Austin and chaplain Burnis KingfisherMatthew Denay Pleitez. Group opened with brief discussion and psycho-social ed around grief and loss in relationships and in relation to self - identifying life patterns, circumstances, changes that cause losses. Established group norm of speaking from own life experience. Group goal of establishing open and affirming space for members to share loss and experience with grief, normalize grief experience and provide psycho social education and grief support.  [name] [name] was present and engaged throughout group. [name] [name] was supportive of other group members and also discussed own grief around mother's passing two years ago. 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 [name] [name] has not been able to visit her grave and it is also difficult to talk about mother, but stated he felt safe enough in group to do so. [name] [name] was excited about the hospital purchasing a bus ticket for him to discharge.   [place] Austin Counseling Intern

## 2014-05-05 NOTE — Plan of Care (Signed)
Problem: Ineffective individual coping Goal: LTG: Patient will report a decrease in negative feelings Outcome: Progressing Pt reports that he is feeling optimistic about his perspective post discharge.

## 2014-05-05 NOTE — Plan of Care (Signed)
Problem: Ineffective individual coping Goal: STG-Increase in ability to manage activities of daily living Outcome: Progressing Increase in level of personal care as evident by shower and current dress

## 2014-05-05 NOTE — Tx Team (Signed)
Interdisciplinary Treatment Plan Update (Adult) Date: 05/05/2014   Time Reviewed: 9:30 AM  Progress in Treatment: Attending groups: Yes Participating in groups: Yes Taking medication as prescribed: Yes Tolerating medication: Yes Family/Significant other contact made: Yes Patient understands diagnosis: Yes Discussing patient identified problems/goals with staff: Yes Medical problems stabilized or resolved: Yes Denies suicidal/homicidal ideation: Yes, denies Issues/concerns per patient self-inventory: Yes Other:  New problem(s) identified: N/A  Discharge Plan or Barriers: Discharge anticipated for today 05/05/14. [facility]. Transportation assistance will be offered.  Reason for Continuation of Hospitalization:  Depression Anxiety Medication Stabilization   Comments: N/A  Estimated length of stay: Discharge anticipated for today 05/05/14  For review of initial/current patient goals, please see plan of care. [name] [name] is a 33 years old Caucasian male admitted with Major Depression Disorder and with passive SI. Pt denies HI, AVH, and denies substance abuse. He reports that when [facility], pt moved back in with his sister, who through him out a few days ago. Pt stated that the anniversary of his mother's death is coming up, triggering increased depression. Pt stated that [place] and away from his sisters and other negative people. Recommendations for pt include: crisis stabilization, therapeutic milieu, encourage group attendance and participation, medication management for mood stabilization/depression/SI, and development of comprehensive mental wellness plan. Pt hoping to get money to pay for greyhound ticket to get to his uncle[place], [place] where he said they will allow him to live.   Attendees: Patient:    Family:    Physician: Dr. Jama Flavorsobos; Dr. Dub MikesLugo 05/05/2014 9:30 AM   Nursing: [place] Minersonna Shimp, Kathi SimpersSarah Twyman, Lendell CapriceBrittany Guthrie, RN 05/05/2014 9:30 AM  Clinical Social Worker: Samuella BruinKristin Hannah Crill, LCSWA 05/05/2014 9:30 AM  Other: Juline PatchQuylle Hodnett, LCSW 05/05/2014 9:30 AM  Other: Leisa LenzValerie Enoch, Vesta [facility] Liaison 05/05/2014 9:30 AM  Other: Onnie BoerJennifer Clark, Case Manager 12/1//2015 9:30 AM  Other: Mosetta AnisAggie Nwoko, Laura Davis, NP 12/1//2015 9:30 AM  Other:       Scribe for Treatment Team:  Samuella BruinKristin Edenilson Austad, MSW, LCSWA [phone]

## 2014-05-06 DIAGNOSIS — F332 Major depressive disorder, recurrent severe without psychotic features: Secondary | ICD-10-CM

## 2014-05-06 NOTE — BHH Group Notes (Signed)
   [facility] Note  05/06/2014  8:45 AM   Participation Quality: Alert, Appropriate and Oriented  Mood/Affect: Appropriate  Depression Rating: 0  Anxiety Rating: 0  Thoughts of Suicide: Pt denies SI/HI  Will you contract for safety? Yes  Current AVH: Pt denies  Plan for Discharge/Comments: Pt attended discharge planning group and actively participated in group. CSW provided pt with today's workbook. [facility] with [facility] assistance from hospital to stay with his uncle. Patient reports that he has been in contact with uncle in [place], patient has declined for CSW to contact uncle. Patient agreeable to follow up with [facility] for [facility] services.  Transportation Means: Pt reports access to transportation via [employer] bus  Supports: Patient has identified his uncle in [place] as a support  Belenda CruiseKristin Shaheim Mahar, MSW, [employer]LCSWA Clinical Social Worker [facility] [phone]

## 2014-05-06 NOTE — Progress Notes (Addendum)
Patient excited about discharge today and going to [place] to be with family.  SW stated patient needed to be discharged by 1100 so he could meet his bus on time.  Patient denied SI and HI.  Denied A/V hallucinations.  Denied pain.

## 2014-05-06 NOTE — BHH Group Notes (Signed)
City bus pass, [employer] [place], and $10 placed on patient's chart. CSW reviewed travel information with patient prior to discharge, patient has no further questions.  Samuella BruinKristin Nehemias Sauceda, MSW, [employer]LCSWA Clinical Social Worker [facility] [phone]

## 2014-05-06 NOTE — BHH Suicide Risk Assessment (Signed)
Suicide Risk Assessment  Discharge Assessment     Demographic Factors:  Male and Caucasian  Total Time spent with patient: 30 minutes  Psychiatric Specialty Exam:     Blood pressure 128/81, pulse 93, temperature 97.8 F (36.6 C), temperature source Oral, resp. rate 18, height 5\' 10"  (1.778 m), weight 122.471 kg (270 lb), SpO2 98 %.Body mass index is 38.74 kg/(m^2).  General Appearance: Fairly Groomed  Patent attorneyye Contact::  Fair  Speech:  Clear and Coherent  Volume:  Normal  Mood:  Euthymic  Affect:  Appropriate  Thought Process:  Coherent and Goal Directed  Orientation:  Full (Time, Place, and Person)  Thought Content:  plans as he moves on  Suicidal Thoughts:  No  Homicidal Thoughts:  No  Memory:  Immediate;   Fair Recent;   Fair Remote;   Fair  Judgement:  Fair  Insight:  Present  Psychomotor Activity:  Restlessness  Concentration:  Fair  Recall:  [employer]ervFair  Fund of Knowledge:NA  Language: Fair  Akathisia:  No  Handed:    AIMS (if indicated):     Assets:  Desire for Improvement Social Support  Sleep:  Number of Hours: 6.75    Musculoskeletal: Strength & Muscle Tone: within normal limits Gait & Station: normal Patient leans: N/A   Mental Status Per Nursing Assessment::   On Admission:  Suicidal ideation indicated by patient, Self-harm thoughts  Current Mental Status by Physician: In full contact with reality. There are no active SI plans or intent. He is encouraged and excited by the fact that he is going to be able to go to [place] with his uncle who is going to provide a place to stay and get him connected to get a job.    Loss Factors: NA  Historical Factors: NA  Risk Reduction Factors:   Living with another person, especially a relative and Positive social support  Continued Clinical Symptoms:  Depression:   Comorbid alcohol abuse/dependence  Cognitive Features That Contribute To Risk:  Closed-mindedness Polarized thinking Thought constriction (tunnel  vision)    Suicide Risk:  Minimal: No identifiable suicidal ideation.  Patients presenting with no risk factors but with morbid ruminations; may be classified as minimal risk based on the severity of the depressive symptoms  Discharge Diagnoses:   AXIS I:  Major Depression recurrent severe AXIS II:  No diagnosis AXIS III:   Past Medical History  Diagnosis Date  . Mental disorder   . Hypertension   . Depression   . Bipolar 1 disorder   . Irregular heart rate   . Asthma    AXIS IV:  other psychosocial or environmental problems AXIS V:  61-70 mild symptoms  Plan Of Care/Follow-up recommendations:  Activity:  as tolerated Diet:  regular Follow up on outpatient basis at the local mental health agency Is patient on multiple antipsychotic therapies at discharge:  No   Has Patient had three or more failed trials of antipsychotic monotherapy by history:  No  Recommended Plan for Multiple Antipsychotic Therapies: NA    Verona Hartshorn A 05/06/2014, 10:15 AM

## 2014-05-06 NOTE — Progress Notes (Signed)
Pt reports he is doing much better.  He says he is ready for discharge.  He reports that he would have been discharged today, but CSW was not able to get him a bus ticket for today.  He feels he can make it emotionally if he can get back to [place] where he has support and a place to stay.  He appears happy and hopeful while talking with Clinical research associatewriter.  He denies SI/HI/AV. He says he has been going to groups.  He feels his medication is working.  Pt makes his needs known to staff.  He is polite/cooperative with staff.  Support and encouragement offered.  Safety maintained with q15 minute checks.

## 2014-05-06 NOTE — Progress Notes (Signed)
[facility] Adult Case Management Discharge Plan :  Will you be returning to the same living situation after discharge: No, [facility] At discharge, do you have transportation home?:Yes,  patient will be provided with [employer] bus ticket Do you have the ability to pay for your medications:Yes,  patient will be provided with medication samples and prescriptions  Release of information consent forms completed and in the chart;  Patient's signature needed at discharge.  Patient to Follow up at: Follow-up Information    Follow [facility] with [facility]   Why:  Please contact [facility] regarding scheduling medication management and therapy services at discharge.   Contact information:   [address],  [place], [place] [id number] [phone]      Patient denies SI/HI:   Yes,  denies    Safety Planning and Suicide Prevention discussed:  Yes,  with patient  Ketty Bitton, West CarboKristin L 05/06/2014, 10:52 AM

## 2014-05-06 NOTE — Progress Notes (Signed)
Discharge Note:  Patient discharged with bus ticket.  Patient denied SI and HI.  Denied A/V hallucinations.  Denied pain.  Suicide prevention information given and discussed with patient who stated he understood and had no questions.  Patient stated he received all his belongings, clothing, toiletries, misc items, prescriptions, medications, shoes, coat/jacket, phone, earphones, wallet with cards, duffle bag, book bag, etc.  Patient stated he appreciated all assistance received from [facility].

## 2014-05-08 NOTE — Progress Notes (Signed)
Patient Discharge Instructions:  After Visit Summary (AVS):   Faxed to:  05/08/14 Discharge Summary Note:   Faxed to:  05/08/14 Psychiatric Admission Assessment Note:   Faxed to:  05/08/14 Suicide Risk Assessment - Discharge Assessment:   Faxed to:  05/08/14 Faxed/Sent to the Next Level Care provider:  05/08/14 Faxed to [facility] @ [phone]  Jerelene ReddenSheena E [place], 05/08/2014, 2:41 PM

## 2015-01-22 ENCOUNTER — Emergency Department
Admission: EM | Admit: 2015-01-22 | Discharge: 2015-01-22 | Disposition: A | Discharge status: Home / Self Care | Payer: Self-pay | Attending: Family Medicine | Admitting: Family Medicine

## 2015-01-22 ENCOUNTER — Emergency Department: Payer: Self-pay

## 2015-01-22 DIAGNOSIS — Z760 Encounter for issue of repeat prescription: Secondary | ICD-10-CM | POA: Insufficient documentation

## 2015-01-22 MED ORDER — PAROXETINE HCL 10 MG PO TABS
40.0000 mg | ORAL_TABLET | Freq: Every morning | ORAL | Status: DC
Start: 2015-01-22 — End: 2015-02-06

## 2015-01-22 MED ORDER — IBUPROFEN 600 MG PO TABS
600.0000 mg | ORAL_TABLET | Freq: Once | ORAL | Status: AC
Start: 2015-01-22 — End: 2015-01-22
  Administered 2015-01-22: 600 mg via ORAL

## 2015-01-22 MED ORDER — QUETIAPINE FUMARATE 100 MG PO TABS
200.0000 mg | ORAL_TABLET | Freq: Once | ORAL | Status: AC
Start: 2015-01-22 — End: 2015-01-22
  Administered 2015-01-22: 200 mg via ORAL
  Filled 2015-01-22 (×2): qty 2

## 2015-01-22 MED ORDER — TRAZODONE HCL 50 MG PO TABS
150.0000 mg | ORAL_TABLET | Freq: Every evening | ORAL | Status: DC
Start: 2015-01-22 — End: 2015-02-06

## 2015-01-22 MED ORDER — IBUPROFEN 600 MG PO TABS
ORAL_TABLET | ORAL | Status: AC
Start: 2015-01-22 — End: ?
  Filled 2015-01-22: qty 1

## 2015-01-22 MED ORDER — GABAPENTIN 100 MG PO CAPS
300.0000 mg | ORAL_CAPSULE | Freq: Three times a day (TID) | ORAL | Status: AC
Start: 2015-01-22 — End: ?

## 2015-01-22 MED ORDER — TRAZODONE HCL 50 MG PO TABS
150.0000 mg | ORAL_TABLET | Freq: Every evening | ORAL | Status: DC
Start: 2015-01-22 — End: 2015-01-23
  Administered 2015-01-22: 150 mg via ORAL
  Filled 2015-01-22 (×2): qty 3

## 2015-01-22 MED ORDER — QUETIAPINE FUMARATE 200 MG PO TABS
200.0000 mg | ORAL_TABLET | Freq: Every evening | ORAL | Status: AC
Start: 2015-01-22 — End: ?

## 2015-01-22 NOTE — Discharge Instructions (Signed)
Treating Panic Disorder with Medication  Panic disorderis a type of anxiety disorder characterized by panic attacks. A panic attack is a sudden, intense "fight or flight" response. It's accompanied by terror, severe physical symptoms, and a strong need to escape wherever you are. If you have panic disorder, your doctor may prescribe one or more medications for treatment. Common medications are described below.    Types of Medications  Medications to treat panic disorder include:   Anti-anxiety medication:This medication relieves symptoms and helps you relax. Your healthcare provider will explain when and how to use it. It may be prescribed for use before entering a situation that makes you anxious. Or, you may be told to take it on a regular schedule. Anti-anxiety medication may make you feel a little sleepy or "out of it." Don't drive a car or operate machinery while on this medication, until you know how it affects you.   Antidepressant medication:This kind of medication is often used to treat anxiety, even if you aren't depressed. An antidepressant balances out brain chemicals. This helps keep anxiety under control. This medication is taken on a schedule. It takes a few weeks to start working. If you don't notice a change at first, you may just need more time. But if you don't notice results after the first few weeks, tell your doctor.  Tips for Taking Medications  Never change your dosage or stop taking your medications without talking to your doctor first. Keep the following in mind:   Some medications must be taken on a schedule.Make this part of a daily routine. For instance, always take your pill before brushing your teeth. A pillbox can help you remember if you've taken your medication each day.   Medications are often taken for 6 to 12 months.Your healthcare provider will then evaluate whether you need to stay on them. Many people who have also had therapy may no longer need medication to  manage anxiety.   You may need to stop taking medication slowlyto give your body time to adjust. When it's time to stop, your doctor will tell you more.   If symptoms return, you may need to start taking medications again.This isn't your fault. It's just the nature of your anxiety disorder.     [address] [employer] [address] [place], [place] [address]. All rights reserved. This information is not intended as a substitute for professional medical care. Always follow your healthcare professional's instructions.

## 2015-01-22 NOTE — ED Notes (Signed)
"  I need refills for my medicine."  Patient states that he travels for the fair and has been out of his medication for 1 month.  Patient states that his Dr in [facility], and he will be there in 2 weeks, but he doesn't feel that he can wait that long for refills.  Patient states that he has been crying, hasn't wanted to be around people, has been staying in his room, and has had no appetite since being off his medication.  Paxel60mg , Trazadone150mg , and Seroquel 200mg , Neurontin300mg

## 2015-01-22 NOTE — ED Provider Notes (Signed)
EMERGENCY DEPARTMENT HISTORY AND PHYSICAL EXAM    Date: 01/22/2015  Patient Name: Raymond Thornton,Raymond Thornton  Attending Physician: Alger Simons, MD  Patient DOB:  06-Feb-1981  MRN:  [id number]  Room:  E8/ED8-A      History of Presenting Illness     Chief Complaint: Medication refill    HPI/ROS is limited by: none  HPI/ROS given by: patient       Context: Raymond Thornton is a 34 y.o. male who presents with med refill. Patient is traveling out of town with work and will not be back in time to refill medications.   Location: generalized Severity: moderate  Duration: 3 weeks  Quality: vague  Associated Signs/ Symptoms: headache Exacerbation/Mitigating factors: unknown      PMD: Pcp, Noneorunknown, MD    Past Medical History     Past Medical History   Diagnosis Date   . Depression    . Hypertension    . Anxiety        Past Surgical History     History reviewed. No pertinent past surgical history.    Family History     History reviewed. No pertinent family history.    Social History     Social History     Social History   . Marital Status: Single     Spouse Name: N/A   . Number of Children: N/A   . Years of Education: N/A     Social History Main Topics   . Smoking status: Former Games developer   . Smokeless tobacco: Never Used   . Alcohol Use: No   . Drug Use: No   . Sexual Activity: Not on file     Other Topics Concern   . Not on file     Social History Narrative   . No narrative on file       Allergies     No Known Allergies    Home Medications     Prior to Admission medications    Medication Sig Start Date End Date Taking? Authorizing Provider   gabapentin (NEURONTIN) 300 MG capsule Take 300 mg by mouth 3 (three) times daily.   Yes [provider]   PARoxetine (PAXIL) 40 MG tablet Take 40 mg by mouth every morning.   Yes [provider]   QUEtiapine (SEROQUEL) 200 MG tablet Take 200 mg by mouth nightly.   Yes [provider]   traZODone (DESYREL) 150 MG tablet Take 150 mg by mouth nightly.   Yes [provider]       ED Medications Administered     ED Medication Orders     Start Ordered     Status Ordering Provider    01/22/15 2230 01/22/15 2229  QUEtiapine (SEROquel) tablet 200 mg   Once     Route: Oral  Ordered Dose: 200 mg     Ordered Misael Mcgaha J II    01/22/15 2230 01/22/15 2229  traZODone (DESYREL) tablet 150 mg   At bedtime     Route: Oral  Ordered Dose: 150 mg     Ordered Herbert Aguinaldo J II    01/22/15 2216 01/22/15 2215  ibuprofen (ADVIL,MOTRIN) tablet 600 mg   Once     Route: Oral  Ordered Dose: 600 mg     Last MAR action:  Given Brennin Durfee J II            Review of Systems     Review of Systems   Constitutional: Negative.  HENT:        Pt. Reports having a headache subsequent to being off his meds. for 3 weeks.    Eyes: Negative.  Negative for blurred vision, double vision and photophobia.   Respiratory: Negative.  Negative for shortness of breath.    Cardiovascular: Negative.    Gastrointestinal: Negative.  Negative for nausea and vomiting.   Genitourinary: Negative.    Musculoskeletal: Negative.  Negative for back pain, joint pain and neck pain.   Skin: Negative.    Neurological: Positive for headaches. Negative for dizziness and loss of consciousness.   Endo/Heme/Allergies: Negative.    Psychiatric/Behavioral: The patient is nervous/anxious.         Pt. Has been out of his medication for 3 weeks and reports loss of appetite, and feeling isolated with episodes of crying.    All other systems reviewed and are negative.        Physical Exam     Physical Exam   Constitutional: He is oriented to person, place, and time. He appears well-developed and well-nourished.   HENT:   Head: Normocephalic and atraumatic.   Eyes: Conjunctivae and EOM are normal. Pupils are equal, round, and reactive to light.   Cardiovascular: Normal rate, regular rhythm, normal heart sounds and intact distal pulses.  Exam reveals no gallop and no friction rub.    No murmur heard.  Pulmonary/Chest: Effort normal and breath  sounds normal. He exhibits no tenderness.   Abdominal: Soft. Bowel sounds are normal.   Musculoskeletal: He exhibits no edema or tenderness.   Neurological: He is alert and oriented to person, place, and time. He has normal reflexes.   Skin: Skin is warm and dry.   Psychiatric:   Pt. Appears depressed and anxious. Pt. denies feeling suicidal or homicidal.    Nursing note and vitals reviewed.        Procedures     N/A    Diagnostic Study Results     EKG: N/A    Monitor: N/A    Laboratory results reviewed by ED provider:    Results     ** No results found for the last 24 hours. **          Radiologic study results reviewed by ED provider:    Radiology Results (24 Hour)     ** No results found for the last 24 hours. **      .    Rendering Provider: Minette Brine II, MD      VS     Patient Vitals for the past 24 hrs:   BP Temp Temp src Pulse Resp SpO2 Height Weight   01/22/15 2207 - - - - - - 1.854 m 136.079 kg   01/22/15 2206 (!) 161/94 mmHg 99.1 F (37.3 C) Oral 94 18 98 % 1.854 m 136.079 kg   01/22/15 2205 (!) 161/94 mmHg - - - - - - -   01/22/15 2204 - - - - - 97 % - -         Clinical Course in Emergency Department     Consults: N/A    Reevaluation: N/A    MDM: Medication Refill,     Diagnosis and Disposition     Clinical Impression  1. Medication refill        Disposition  ED Disposition     Discharge [name] discharge to home/self care.    Condition at disposition: Stable  Vital signs were reviewed at the time of disposition.  Patient Vitals for the past 24 hrs:   BP Temp Temp src Pulse Resp SpO2 Height Weight   01/22/15 2207 - - - - - - 1.854 m 136.079 kg   01/22/15 2206 (!) 161/94 mmHg 99.1 F (37.3 C) Oral 94 18 98 % 1.854 m 136.079 kg   01/22/15 2205 (!) 161/94 mmHg - - - - - - -   01/22/15 2204 - - - - - 97 % - -          Prescriptions  New Prescriptions    GABAPENTIN (NEURONTIN) 100 MG CAPSULE    Take 3 capsules (300 mg total) by mouth 3 (three) times daily.    PAROXETINE (PAXIL) 10  MG TABLET    Take 4 tablets (40 mg total) by mouth every morning.    QUETIAPINE (SEROQUEL) 200 MG TABLET    Take 1 tablet (200 mg total) by mouth nightly.    TRAZODONE (DESYREL) 50 MG TABLET    Take 3 tablets (150 mg total) by mouth nightly.         Follow-up Information     Please follow up.    Why:  Follow up with your PMD in [place] as discussed        I, Jesse Fall, acted as a Neurosurgeon for Dr. Ginny Forth on [place].          I am the first provider for this patient and I personally performed the services documented. Beth Allyson Sabal is scribing for me on [place]. This note accurately reflects work and decisions made by me.   Dr. Minette Brine II          SIGNED BY: Minette Brine II, MD        This chart was generated by an EMR and may contain errors, including typographical, or omissions not intended by the user.            Alger Simons, MD  01/22/15 2239

## 2015-01-29 ENCOUNTER — Emergency Department: Payer: Self-pay

## 2015-01-29 ENCOUNTER — Inpatient Hospital Stay: Payer: Self-pay | Admitting: Psychiatry

## 2015-01-29 ENCOUNTER — Inpatient Hospital Stay
Admission: AD | Admit: 2015-01-29 | Discharge: 2015-02-02 | DRG: 885 | Disposition: A | Discharge status: Home / Self Care | Payer: Self-pay | Source: Other Acute Inpatient Hospital | Attending: Psychiatry | Admitting: Psychiatry

## 2015-01-29 ENCOUNTER — Emergency Department
Admission: EM | Admit: 2015-01-29 | Discharge: 2015-01-29 | Disposition: A | Discharge status: Other Acute Inpatient Hospital | Payer: Self-pay | Attending: Emergency Medicine | Admitting: Emergency Medicine

## 2015-01-29 DIAGNOSIS — F32A Depression, unspecified: Secondary | ICD-10-CM | POA: Diagnosis present

## 2015-01-29 DIAGNOSIS — R45851 Suicidal ideations: Secondary | ICD-10-CM | POA: Insufficient documentation

## 2015-01-29 DIAGNOSIS — Z818 Family history of other mental and behavioral disorders: Secondary | ICD-10-CM

## 2015-01-29 DIAGNOSIS — F329 Major depressive disorder, single episode, unspecified: Secondary | ICD-10-CM | POA: Insufficient documentation

## 2015-01-29 DIAGNOSIS — F333 Major depressive disorder, recurrent, severe with psychotic symptoms: Principal | ICD-10-CM | POA: Diagnosis present

## 2015-01-29 DIAGNOSIS — F323 Major depressive disorder, single episode, severe with psychotic features: Secondary | ICD-10-CM

## 2015-01-29 LAB — ECG 12-LEAD
P Wave Axis: 8 deg
P-R Interval: 156 ms
Patient Age: 34 years
Q-T Interval(Corrected): 436 ms
Q-T Interval: 358 ms
QRS Axis: -41 deg
QRS Duration: 102 ms
T Axis: 5 years
Ventricular Rate: 89 //min

## 2015-01-29 LAB — COMPREHENSIVE METABOLIC PANEL
ALT: 23 U/L (ref 0–55)
AST (SGOT): 17 U/L (ref 10–42)
Albumin/Globulin Ratio: 1.2 Ratio (ref 0.70–1.50)
Albumin: 3.5 gm/dL (ref 3.5–5.0)
Alkaline Phosphatase: 100 U/L (ref 40–145)
Anion Gap: 10.8 mMol/L (ref 7.0–18.0)
BUN / Creatinine Ratio: 13.2 Ratio (ref 10.0–30.0)
BUN: 10 mg/dL (ref 7–22)
Bilirubin, Total: 0.9 mg/dL (ref 0.1–1.2)
CO2: 25.5 mMol/L (ref 20.0–30.0)
Calcium: 8.9 mg/dL (ref 8.5–10.5)
Chloride: 110 mMol/L (ref 98–110)
Creatinine: 0.76 mg/dL — ABNORMAL LOW (ref 0.80–1.30)
EGFR: >60 mL/min/{1.73_m2}
Globulin: 2.9 gm/dL (ref 2.0–4.0)
Glucose: 84 mg/dL (ref 70–99)
Osmolality Calc: 282 mOsm/kg (ref 275–300)
Potassium: 3.8 mMol/L (ref 3.5–5.3)
Protein, Total: 6.4 gm/dL (ref 6.0–8.3)
Sodium: 142 mMol/L (ref 136–147)

## 2015-01-29 LAB — CBC AND DIFFERENTIAL
Basophils %: 1 % (ref 0.0–3.0)
Basophils Absolute: 0.1 10*3/uL (ref 0.0–0.3)
Eosinophils %: 4.6 % (ref 0.0–7.0)
Eosinophils Absolute: 0.3 10*3/uL (ref 0.0–0.8)
Hematocrit: 41.3 % (ref 39.0–52.5)
Hemoglobin: 13.8 gm/dL (ref 13.0–17.5)
Lymphocytes Absolute: 1.3 10*3/uL (ref 0.6–5.1)
Lymphocytes: 22.7 % (ref 15.0–46.0)
MCH: 31 pg (ref 28–35)
MCHC: 33 gm/dL (ref 31–36)
MCV: 92 fL (ref 80–100)
MPV: 8.5 fL (ref 6.0–10.0)
Monocytes Absolute: 0.7 10*3/uL (ref 0.1–1.7)
Monocytes: 11.8 % (ref 3.0–15.0)
Neutrophils %: 59.9 % (ref 42.0–78.0)
Neutrophils Absolute: 3.5 10*3/uL (ref 1.7–8.6)
PLT CT: 230 10*3/uL (ref 130–440)
RBC: 4.51 10*6/uL (ref 4.00–5.70)
RDW: 11.8 % (ref 10.5–14.5)
WBC: 5.9 10*3/uL (ref 4.00–11.00)

## 2015-01-29 LAB — VH URINE DRUG SCREEN
Amphetamine: NEGATIVE
Barbiturates: NEGATIVE
Buprenorphine, Urine: NEGATIVE
Cannabinoids: NEGATIVE
Cocaine: NEGATIVE
Methamphetamine: NEGATIVE
Opiates: NEGATIVE
Phencyclidine: NEGATIVE
Propoxyphene: NEGATIVE
Urine Benzodiazepines: NEGATIVE
Urine Methadone Screen: NEGATIVE
Urine Oxycodone: NEGATIVE
Urine Tricyclics: POSITIVE — AB

## 2015-01-29 LAB — TSH: TSH: 1.06 u[IU]/mL (ref 0.40–4.20)

## 2015-01-29 LAB — ETHANOL: Alcohol: <10 mg/dL (ref 0–9)

## 2015-01-29 LAB — ACETAMINOPHEN LEVEL: Acetaminophen Level: <0.6 ug/mL — ABNORMAL LOW (ref 10.0–30.0)

## 2015-01-29 NOTE — ED Notes (Addendum)
i called vmt to see about transport for pt. To be transferred to [facility] and i spoke with [facility] at dispatch and hse gave me an eta 2120

## 2015-01-29 NOTE — ED Notes (Signed)
i sent a copy of the face sheet and the transfer summary with the vmt crew
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
## 2015-01-29 NOTE — ED Notes (Signed)
Pt on polycom with [facility]

## 2015-01-29 NOTE — ED Notes (Signed)
Spoke to [place] at VMT on status of pts transport at least 2 hours.

## 2015-01-29 NOTE — ED Notes (Signed)
i sent a copy of the face sheet with the pt. To [facility] in the envelope

## 2015-01-29 NOTE — ED Notes (Signed)
Pt states he is feeling depressed. Has a lot of anxiety and that he feels hopeless. Cooperative.

## 2015-01-29 NOTE — ED Notes (Signed)
SEE QUICK TRIAGE

## 2015-01-29 NOTE — ED Notes (Signed)
Pt resting quietly on stretcher with eyes closed at times. Cooperative.
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
## 2015-01-29 NOTE — ED Notes (Signed)
Pt is being transferred to [facility] accepted by Dr. Tamera Punt, going to room # 114A.

## 2015-01-29 NOTE — ED Notes (Signed)
Labs redrawn on patient. Patients armband scanned and labs labeled at the bedside.

## 2015-01-29 NOTE — ED Notes (Signed)
Pt given something to drink and a box lunch

## 2015-01-29 NOTE — ED Notes (Signed)
i called vmt to check on transport crew for pt. To be transferred to [facility] and i spoke with jaime at dispatch and hse gave me a eta 2240

## 2015-01-30 DIAGNOSIS — F32A Depression, unspecified: Secondary | ICD-10-CM | POA: Diagnosis present

## 2015-01-30 DIAGNOSIS — F323 Major depressive disorder, single episode, severe with psychotic features: Secondary | ICD-10-CM

## 2015-01-30 DIAGNOSIS — F333 Major depressive disorder, recurrent, severe with psychotic symptoms: Secondary | ICD-10-CM

## 2015-01-30 MED ORDER — QUETIAPINE FUMARATE 25 MG PO TABS
50.0000 mg | ORAL_TABLET | Freq: Two times a day (BID) | ORAL | Status: DC
Start: 2015-01-30 — End: 2015-01-30

## 2015-01-30 MED ORDER — TRAZODONE HCL 50 MG PO TABS
150.0000 mg | ORAL_TABLET | Freq: Every evening | ORAL | Status: DC
Start: 2015-01-30 — End: 2015-02-02
  Administered 2015-01-30 – 2015-02-01 (×4): 150 mg via ORAL
  Filled 2015-01-30 (×6): qty 1

## 2015-01-30 MED ORDER — HALOPERIDOL LACTATE 5 MG/ML IJ SOLN
5.0000 mg | Freq: Four times a day (QID) | INTRAMUSCULAR | Status: DC | PRN
Start: 2015-01-30 — End: 2015-02-02

## 2015-01-30 MED ORDER — QUETIAPINE FUMARATE 200 MG PO TABS
200.0000 mg | ORAL_TABLET | Freq: Every evening | ORAL | Status: DC
Start: 2015-01-30 — End: 2015-02-02
  Administered 2015-01-30 – 2015-02-01 (×4): 200 mg via ORAL
  Filled 2015-01-30 (×3): qty 1

## 2015-01-30 MED ORDER — TRAZODONE HCL 50 MG PO TABS
150.0000 mg | ORAL_TABLET | Freq: Every evening | ORAL | Status: DC
Start: 2015-01-30 — End: 2015-01-30
  Filled 2015-01-30 (×2): qty 1

## 2015-01-30 MED ORDER — DIPHENHYDRAMINE HCL 50 MG/ML IJ SOLN
50.0000 mg | Freq: Four times a day (QID) | INTRAMUSCULAR | Status: DC | PRN
Start: 2015-01-30 — End: 2015-02-02

## 2015-01-30 MED ORDER — QUETIAPINE FUMARATE 200 MG PO TABS
200.0000 mg | ORAL_TABLET | Freq: Every evening | ORAL | Status: DC
Start: 2015-01-30 — End: 2015-01-30
  Filled 2015-01-30: qty 1

## 2015-01-30 MED ORDER — ACETAMINOPHEN 325 MG PO TABS
650.0000 mg | ORAL_TABLET | Freq: Four times a day (QID) | ORAL | Status: DC | PRN
Start: 2015-01-30 — End: 2015-02-02
  Administered 2015-01-31 – 2015-02-01 (×3): 650 mg via ORAL
  Filled 2015-01-30 (×3): qty 2

## 2015-01-30 MED ORDER — MAGNESIUM HYDROXIDE 400 MG/5ML PO SUSP
30.0000 mL | Freq: Every day | ORAL | Status: DC
Start: 2015-01-30 — End: 2015-02-01
  Administered 2015-01-30 – 2015-01-31 (×2): 30 mL via ORAL
  Filled 2015-01-30 (×2): qty 30

## 2015-01-30 MED ORDER — IBUPROFEN 600 MG PO TABS
600.0000 mg | ORAL_TABLET | Freq: Four times a day (QID) | ORAL | Status: DC | PRN
Start: 2015-01-30 — End: 2015-02-02
  Administered 2015-01-30 – 2015-02-02 (×4): 600 mg via ORAL
  Filled 2015-01-30 (×4): qty 1

## 2015-01-30 MED ORDER — PAROXETINE HCL 20 MG PO TABS
40.0000 mg | ORAL_TABLET | Freq: Every morning | ORAL | Status: DC
Start: 2015-01-30 — End: 2015-02-02
  Administered 2015-01-30 – 2015-02-02 (×4): 40 mg via ORAL
  Filled 2015-01-30 (×4): qty 2

## 2015-01-30 MED ORDER — QUETIAPINE FUMARATE 25 MG PO TABS
50.0000 mg | ORAL_TABLET | Freq: Two times a day (BID) | ORAL | Status: DC
Start: 2015-01-30 — End: 2015-02-01
  Administered 2015-01-30 – 2015-02-01 (×5): 50 mg via ORAL
  Filled 2015-01-30 (×5): qty 2

## 2015-01-30 MED ORDER — GABAPENTIN 300 MG PO CAPS
300.0000 mg | ORAL_CAPSULE | Freq: Three times a day (TID) | ORAL | Status: DC
Start: 2015-01-30 — End: 2015-02-02
  Administered 2015-01-30 – 2015-02-02 (×10): 300 mg via ORAL
  Filled 2015-01-30 (×10): qty 1

## 2015-01-30 MED ORDER — ALUM & MAG HYDROXIDE-SIMETH 200-200-20 MG/5ML PO SUSP
30.0000 mL | Freq: Four times a day (QID) | ORAL | Status: DC | PRN
Start: 2015-01-30 — End: 2015-02-02

## 2015-01-30 MED ORDER — HYDROXYZINE PAMOATE 25 MG PO CAPS
25.0000 mg | ORAL_CAPSULE | Freq: Four times a day (QID) | ORAL | Status: DC | PRN
Start: 2015-01-30 — End: 2015-02-02
  Administered 2015-01-30 – 2015-02-02 (×3): 25 mg via ORAL
  Filled 2015-01-30 (×3): qty 1

## 2015-01-30 MED ORDER — DIPHENHYDRAMINE HCL 50 MG PO CAPS
50.0000 mg | ORAL_CAPSULE | Freq: Four times a day (QID) | ORAL | Status: DC | PRN
Start: 2015-01-30 — End: 2015-02-02

## 2015-01-30 MED ORDER — HALOPERIDOL 5 MG PO TABS
5.0000 mg | ORAL_TABLET | Freq: Four times a day (QID) | ORAL | Status: DC | PRN
Start: 2015-01-30 — End: 2015-02-02

## 2015-01-30 NOTE — H&P (Addendum)
CHIEF COMPLAINT: "I slipped off."    HISTORY OF PRESENT ILLNESS: Patient is a 34 year old Caucasian  male who brought himself to ER.  According the ER notes, he  denied a plan to kill himself, but he reported he came before  things got too bad.  At that time, patient reported he does not  feel safe to live in thecommunity and returned to his mobile  home where he is staying.  He lives with his co-workers, who  travel most of the days of the year and reported that at time,  they do not understand him.  He denied any homicidal ideations.  Patient was today seen at the [facility].  According to the  patient, he fell that he completely he slipped off and could not  control himself.  Patient reports it is hard to explain the kind  of suicidal thoughts he was having, but he did not have any plan  at that time.  Patient reports over the past few months, he has  been having crying spells and has been feeling more hopeless  than before.  Patient denies hearing any voices or any manic  Symptoms.  Patient reports many years ago he was hearing voices but he does not hear them any more.    PAST PSYCHIATRIC HISTORY: Patient reports he has been diagnosed  with depression and is currently on Paxil, Neurontin, trazodone,  and Seroquel.  Patient reports he last saw a psychiatrist three  Years ago in [place] and has been taking prescriptions from a  community center.  Patient reports he has had multiple admission  in [place], [place] and his last admission took place  a few years ago.    SUBSTANCE ABUSE HISTORY: Patient denies using alcohol or tobacco  in any form.  Denies using any illicit drugs.    FAMILY HISTORY: He says his mother passed away three years ago,  suffered from bipolar depression.    SOCIAL HISTORY: Patient reports he works for a [employer] and is  always on the road.  Patient was born in [place], [place].  He describes his childhood as good.  No form of physical or  sexual abuse.  Educated, 10  grade.  He is single and has a  15 year old son, he travels most of the time.  He has no legal  cases against him.    ALLERGIES: No known drug allergies.  BP 130/84 mmHg  Pulse 96  Temp(Src) 97.5 F (36.4 C) (Oral)  Resp 20  Ht 1.854 m (6\' 1" )  Wt 144.153 kg (317 lb 12.8 oz)  BMI 41.94 kg/m2  SpO2 100%      MENTAL STATUS EXAMINATION: Patient is a 34 year old Caucasian  male, who appears his stated age.  He made intermittent eye  contact.  Mild psychomotor retardation.  No agitation.  No  abnormal body movements.  Speech spontaneous, clear, and  coherent, not pressured.  Mood sad.  Affect dysphoric.  Linear  and goal directed thought process.  Preoccupation with current  predicament.  Positive suicidal thoughts, unclear intent or  plan.  No auditory or visual hallucinations.  Insight fair.  Judgment fair.  Cognition, alert and oriented x3.    DIAGNOSIS: Major depressive disorder, severe, recurrent, with  psychosis.    ASSESSMENT AND PLAN: A 34 year old Caucasian male with history  of depression, comes to the ER with presenting complaints of  suicidal thoughts.  We will admit the patient to the inpatient  psychiatric and for further  stabilization and evaluation and to  continue provide a therapeutic milieu inclusive of individual  and group therapy and to provide a safe environment.  Encourage  patient to attend therapeutic groups and milieu, which is  inclusive of ward programming.  Estimated length of stay is five  to six days.    MEDICATIONS: We will continue him on:    1.  Neurontin 300 mg three times daily.  2.  Paxil 40 mg dose in morning.  3.  Seroquel 200 mg at bedtime and 50 mg daily.  4.  Trazodone 150 mg at bedtime.        I hereby certify this patient for hospitalization based upon   medical necessity as noted above.    Azucena Kuba, MD

## 2015-01-30 NOTE — Plan of Care (Signed)
Problem: At Risk for Suicide / Harm to Self AS EVIDENCED BY...  Goal: Reduction of suicidal ideation, and no attempts  Outcome: Progressing  Patient denies SI, HI and AVH.

## 2015-01-30 NOTE — Progress Notes (Signed)
Patient presents as blunted but brightens upon approach.  Patient is alert and oriented x 4.  Patient denies SI, HI and AVH.  Patient slept through most of the morning but has been awake and visible on the unit in the afternoon.  Patient is social and interactive with peers.  Patient is polite, pleasant and cooperative with staff.  Patient did not attend morning groups but did attend afternoon and evening groups.  Patient did participate appropriately.  Patient states he has been more depressed recently but is doing "okay" today.  All scheduled 7a-7p, PRN and first dose medications given with no adverse reactions or side effects observed or reported.  No management problem.  Patient's right eye was red earlier in the day but has since cleared up.

## 2015-01-30 NOTE — Plan of Care (Signed)
Problem: Safety  Goal: Patient will be free from injury during hospitalization  Outcome: Progressing  Patient has not reported any injuries.

## 2015-01-30 NOTE — Plan of Care (Signed)
Problem: Health Promotion  Goal: Vaccination Screening  All patients will be screened for current vaccination status on each admission.   Outcome: Completed Date Met:  01/30/15  N/A no longer vaccinating for the flu  Goal: Risk control - tobacco abuse  Actions to eliminate or reduce tobacco use.   Outcome: Completed Date Met:  01/30/15  Pt is a non smoker.    Problem: At Risk for Suicide / Harm to Self AS EVIDENCED BY...  Goal: Verbalizes understanding of medication, benefits, and side effects  Outcome: Progressing  Goal: Suicide Alert Level 1: to maintain patient safety during hospitalization  Outcome: Progressing

## 2015-01-30 NOTE — Plan of Care (Signed)
Problem: At Risk for Suicide / Harm to Self AS EVIDENCED BY...  Goal: Verbalizes understanding of medication, benefits, and side effects  Outcome: Progressing  Patient states he understands his medications.

## 2015-01-30 NOTE — Progress Notes (Signed)
Nutrition Note:      Height:  1.854 m (6\' 1" )  Weight:  144.153 kg (317 lb 12.8 oz)  BMI:  Body mass index is 41.94 kg/(m^2).    Patient meets criteria for Morbid Obesity with BMI > 40    Pecola Lawless, RD  01/30/2015 9:27 AM

## 2015-01-30 NOTE — Plan of Care (Signed)
Problem: At Risk for Suicide / Harm to Self AS EVIDENCED BY...  Goal: Suicide Alert Level 1: to maintain patient safety during hospitalization  Outcome: Progressing  Patient has remained on level 1.

## 2015-01-30 NOTE — Plan of Care (Signed)
Problem: At Risk for Suicide / Harm to Self AS EVIDENCED BY...  Goal: Completes discharge safety and recovery plan  Outcome: Not Progressing  Patient states he has not started working on his [place] plan but will soon.

## 2015-01-30 NOTE — Plan of Care (Signed)
Problem: Pain  Goal: Patient's pain/discomfort is manageable  Outcome: Progressing  Patient is reporting a pain level of 5 in his lower back but states he doesn't need any medication for it.

## 2015-01-30 NOTE — Plan of Care (Signed)
Problem: At Risk for Suicide / Harm to Self AS EVIDENCED BY...  Goal: Identifies stressors and protective factors  Outcome: Completed Date Met:  01/30/15  Patient identifies his stressors as "idiots...grown people acting like little kids."  Patient identifies his protective factors as "watching TV, playing basketball with friends."

## 2015-01-30 NOTE — Plan of Care (Signed)
Problem: At Risk for Suicide / Harm to Self AS EVIDENCED BY...  Goal: Attends a minimum number of therapies daily  Outcome: Progressing  Patient is attending some groups and participating appropriately.

## 2015-01-30 NOTE — Plan of Care (Signed)
Problem: Psychosocial and Spiritual Needs  Goal: Demonstrates ability to cope with hospitalization/illness  Outcome: Progressing  Patient has not reported or displayed any problems coping with hospitalization.

## 2015-01-30 NOTE — Progress Notes (Signed)
All 7pm - 7am medications given with no reported or observed side effects or adverse reactions including first doses given.

## 2015-01-30 NOTE — Plan of Care (Signed)
Problem: At Risk for Suicide / Harm to Self AS EVIDENCED BY...  Goal: Patient's recovery goal in his/her own words:  Outcome: Completed Date Met:  01/30/15  Patient states his recovery goal is to "get meds changed."

## 2015-01-30 NOTE — Psych Admission Note (Signed)
Pt voluntary admission from [facility] ED.  Pt stated he has been on his current medications for several years and has been doing great until the last few days.  Pt indicated he hasn't been sleeping well and has been short tempered with his co-workers which is not like him.    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 He stated he gets most of his medicatons from hospitals.   Pt stated his hometown is [place] and his sister and 7 year old son live there.  Pt denies SI,HI,AVH.  Skin assessment performed and skin intact, no contraband found.  Pt oriented to unit and room.

## 2015-01-31 NOTE — Progress Notes (Addendum)
Progress Note/SOAP Note    Subjective:   Any Comments/Concerns/Issues: Reports gradual improvement in presenting symptoms and Tolerating medications and treatment well   He reports he is feeling better since his admission. 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  Self Abuse/Suicidal:  No thoughts, no intent, no plan  Homicidal:  No homicidal thoughts, intent, plan  Side effects:  No  Per Staff: Out in the milieu and adjusting well, Interacting well with other peers and brightens upon approach  Received PRN pain med for back pain.  Objective:   MSE:  General Appearance: Appears stated age, good eye contact and appropriately smiles\  Speech:  Spontaneous, Low Volume and Low Tone  Mood:  "fine"  Affect:  Mood Congruent and Limited Range  Thought Process:  Linear and Goal Directed  Thought Content:  No suicidal thought, intent, plan and No homicidal thought, intent, plan  Perceptual Disturbance:  No audio, tacile, olfactory, visual hallucination  Insight:  Improving  Judgment: Improving   Cognition:   Alert and Oriented to time, place and person  Medications:   Allergies:  Review of patient's allergies indicates no known allergies.    Current Facility-Administered Medications   Medication Dose Route Frequency   . gabapentin  300 mg Oral TID   . magnesium hydroxide  30 mL Oral Daily   . PARoxetine  40 mg Oral QAM   . QUEtiapine  200 mg Oral QHS   . QUEtiapine  50 mg Oral BID   . traZODone  150 mg Oral QHS       Assessment / Plan:   DIAGNOSIS: Major depressive disorder, severe, recurrent, with  psychosis.    Responding to current treatment modalities with improving symptoms, Continues to respond to treatment with gradually improving symptoms, Continue current/other Psychotropic Medications , Benefits and side effects of medications discussed, Encouraged  patient to remain adherent with all treatment modalities and Psychoeducation and supportive Psychotherapy provided    Medical Co morbidities: Continue all home/above mentioned medications.     Barriers to Discharge: Remains Symptomatic requiring further treatment and Requirng further psychiatric stabilization   Possible discharge on Monday or Tuesday if patient continues to show improvement.  Patient understood and agrees with plan.    Azucena Kuba, MD  01/31/2015 1:26 PM
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
## 2015-01-31 NOTE — Progress Notes (Signed)
Patient presents as quiet and blunted but brightens upon approach.  Patient is alert and oriented x 4.  Patient denies SI, HI and AVH.  Patient stayed in bed throughout most of the morning but has been awake and visible this afternoon and evening.  Patient is social and interactive with peers.  Patient is polite, pleasant and cooperative with staff.  Patient is attending afternoon and evening groups and is participating appropriately.  Patient stats he is "good" and that he slept "okay."  Patient has complained about back pain and states "it's no big deal. I will speak to the DR tomorrow about it."  All 7a-7p scheduled, PRN and first dose administrations given with no adverse reactions or side effects observed.  No management proeblem.

## 2015-01-31 NOTE — Plan of Care (Signed)
Problem: Loss of functioning (Thought Disorder, Mood Disturbance and/or Severe Anxiety) AS EVIDENCED BY...  Goal: Maintains adequate sleep/rest pattern  Outcome: Completed Date Met:  01/31/15  Patient states he is getting plenty of sleep.

## 2015-01-31 NOTE — Plan of Care (Signed)
Problem: Pain  Goal: Patient's pain/discomfort is manageable  Outcome: Progressing  Patient is reporting back pain and is being medicated for it.

## 2015-01-31 NOTE — Plan of Care (Signed)
Problem: Loss of functioning (Thought Disorder, Mood Disturbance and/or Severe Anxiety) AS EVIDENCED BY...  Goal: Verbalizes reduction in hallucinations/delusions  Outcome: Completed Date Met:  01/31/15  Patient denies SI, HI and AVH.

## 2015-01-31 NOTE — Plan of Care (Signed)
Problem: At Risk for Suicide / Harm to Self AS EVIDENCED BY...  Goal: Attends a minimum number of therapies daily  Outcome: Progressing  Patient is attending most groups and is participating appropriately.

## 2015-01-31 NOTE — Progress Notes (Signed)
7p-7a shift:  Pt was present on unit, pleasant, no management problem.  Pt did not attend wrap up group.  Pt denied SI/HI/AVH.  Pt has lower back pain, which is chronic, but worse now.  Prn tylenol given, pt slept afterwards.    7p-7a: All medications, including first doses, given with no reported side effects or adverse reactions.

## 2015-01-31 NOTE — Plan of Care (Signed)
Problem: At Risk for Suicide / Harm to Self AS EVIDENCED BY...  Goal: Reduction of suicidal ideation, and no attempts  Outcome: Progressing  Patient denies SI, HI and AVH.

## 2015-01-31 NOTE — Progress Notes (Signed)
11p-7a shift:  No medications given this shift.

## 2015-01-31 NOTE — Plan of Care (Signed)
Problem: At Risk for Suicide / Harm to Self AS EVIDENCED BY...  Goal: Completes discharge safety and recovery plan  Outcome: Progressing  Patient states he has not completed his [place] safety plan.

## 2015-01-31 NOTE — Plan of Care (Signed)
Problem: Safety  Goal: Patient will be free from injury during hospitalization  Outcome: Progressing  Patient has not reported any injuries.

## 2015-01-31 NOTE — Plan of Care (Signed)
Problem: Loss of functioning (Thought Disorder, Mood Disturbance and/or Severe Anxiety) AS EVIDENCED BY...  Goal: Reports improved mood  Outcome: Progressing  Patient states his mood has improved since admission.

## 2015-01-31 NOTE — Plan of Care (Signed)
Problem: Psychosocial and Spiritual Needs  Goal: Demonstrates ability to cope with hospitalization/illness  Outcome: Progressing  Patient has not reported or displayed any problems coping with hospitalization.

## 2015-01-31 NOTE — Plan of Care (Signed)
Problem: Loss of functioning (Thought Disorder, Mood Disturbance and/or Severe Anxiety) AS EVIDENCED BY...  Goal: Verbalizes reduced anxiety  Outcome: Progressing  Patient states his anxiety has improved.

## 2015-01-31 NOTE — Plan of Care (Signed)
Problem: At Risk for Suicide / Harm to Self AS EVIDENCED BY...  Goal: Verbalizes understanding of medication, benefits, and side effects  Outcome: Progressing  Patient states he understands his medications and does not have any questions.

## 2015-02-01 DIAGNOSIS — F333 Major depressive disorder, recurrent, severe with psychotic symptoms: Principal | ICD-10-CM

## 2015-02-01 MED ORDER — MAGNESIUM HYDROXIDE 400 MG/5ML PO SUSP
30.0000 mL | Freq: Every day | ORAL | Status: DC | PRN
Start: 2015-02-01 — End: 2015-02-02

## 2015-02-01 MED ORDER — QUETIAPINE FUMARATE 25 MG PO TABS
50.0000 mg | ORAL_TABLET | Freq: Two times a day (BID) | ORAL | Status: DC
Start: 2015-02-01 — End: 2015-02-02
  Administered 2015-02-01 – 2015-02-02 (×3): 50 mg via ORAL
  Filled 2015-02-01 (×3): qty 2

## 2015-02-01 NOTE — Progress Notes (Signed)
Per next care providers request, History & Physical, Labwork, and FaceSheet were faxed 02/01/2015 4:18 PM [facility] [phone].

## 2015-02-01 NOTE — Progress Notes (Signed)
Pt was given tylenol 650 mg at 0855 for pain rated 7/10 in his back. Pt reported pain decreased to 3/10 - declined further pain medication. Instructed ask for pain medication if he changed his mind. Pt verbalized understanding

## 2015-02-01 NOTE — Plan of Care (Signed)
Pt has been seclusive to his room - resting in bed. Pt gets OOB when asked and comes to med window when asked. Pt has not been social with other patients and has remained on the fringes of the [place]. No needs expressed other than prn Tylenol. Denies voices, SI, HI and AVH.  All 7AM- 7PM medications including first dose administrations given without any reported or observed side effects or adverse reactions

## 2015-02-01 NOTE — Plan of Care (Signed)
Problem: At Risk for Suicide / Harm to Self AS EVIDENCED BY...  Goal: Reduction of suicidal ideation, and no attempts  Pt will verbalize having no active thoughts of suicide for two consecutive days by day 4.   Intervention: Assess suicide risk using Tool for Assessment of Suicide Risk (TASR) (admission, prn, discharge)  Pt is not a risk for suicide. Pt contracts safety. Q 15 minute safety checks as per protocol    Goal: Verbalizes understanding of medication, benefits, and side effects  Outcome: Progressing  Pt verbalized understanding of his medications. Will continue to education and reinforce education prn.  Intervention: Encourage to report response to medications including side effects  Education ongoing Q shift. Education was reinforced on reporting pain medication effectiveness

## 2015-02-01 NOTE — Progress Notes (Signed)
Progress Note/SOAP Note    Subjective:   Any Comments/Concerns/Issues: "I feel a lil better today, I am attending all the groups here, I feel more positive"  Self Abuse/Suicidal:  No thoughts, no intent, no plan  Homicidal:  No homicidal thoughts, intent, plan  Side effects:  No  Per Staff: Out in the milieu and adjusting well, Interacting well with other peers, adherent with medications and no behavioral problems    Objective:   MSE:  General Appearance: Appears stated age, good eye contact, no psychomotor retardation, no psychomotor agitation, no Abnormal body movement noted and Appropriately Attired\  Speech:  Spontaneous, Clear, Coherent and Not pressured  Mood:  "I am ok"  Affect:  Mood Congruent and Normal Range  Thought Process:  Linear, Goal Directed and Under Inclusive  Thought Content:  Preoccupation with current predicament, No Delusion, No suicidal thought, intent, plan and No homicidal thought, intent, plan  Perceptual Disturbance:  No audio, tacile, olfactory, visual hallucination  Insight:  Improving  Judgment: Improving   Cognition:   Alert and Oriented to time, place and person  Medications:   Allergies:  Review of patient's allergies indicates no known allergies.    Current Facility-Administered Medications   Medication Dose Route Frequency   . gabapentin  300 mg Oral TID   . PARoxetine  40 mg Oral QAM   . QUEtiapine  200 mg Oral QHS   . QUEtiapine  50 mg Oral BID   . traZODone  150 mg Oral QHS       Assessment / Plan:   Major depressive disorder, severe, recurrent, with psychosis.    Responding to current treatment modalities with improving symptoms, Benefits and side effects of medications discussed, Encouraged patient to remain adherent with all treatment modalities, Psychoeducation and supportive Psychotherapy provided and Continue current Psychotropic medications, milieu and [facility] programming for further psychiatric stabilization    Medical Co morbidities: None per patient    Barriers to  Discharge: Requires further medication adjustments and monitoring for response and side effects and Requirng further psychiatric stabilization     Patient understood and agrees with plan.    Chapman Fitch, MD  02/01/2015 3:38 PM    Pager #:[phone]  Ext. # [phone]

## 2015-02-01 NOTE — Plan of Care (Signed)
Problem: Safety  Goal: Patient will be free from injury during hospitalization  Outcome: Progressing    Problem: Psychosocial and Spiritual Needs  Goal: Demonstrates ability to cope with hospitalization/illness  Outcome: Progressing    Problem: At Risk for Suicide / Harm to Self AS EVIDENCED BY...  Goal: Reduction of suicidal ideation, and no attempts  Pt will verbalize having no active thoughts of suicide for two consecutive days by day 4.   Outcome: Progressing  Pt denies SI and states he feels great and is ready to go home  Goal: Verbalizes understanding of medication, benefits, and side effects  Outcome: Progressing  Goal: Attends a minimum number of therapies daily  Pt will attend a minimum of 75 % of groups daily by day 5.   Outcome: Progressing  Goal: Suicide Alert Level 1: to maintain patient safety during hospitalization  Outcome: Progressing    Problem: Loss of functioning (Thought Disorder, Mood Disturbance and/or Severe Anxiety) AS EVIDENCED BY...  Goal: Reports improved mood  Verbalize a 6 on a scale of 1-10 in improved mood by day 4.   Outcome: Progressing  Pt states he feels great and denies any feelings of anxiety and depression.
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
## 2015-02-02 MED ORDER — TRAZODONE HCL 150 MG PO TABS
150.0000 mg | ORAL_TABLET | Freq: Every evening | ORAL | Status: AC
Start: 2015-02-02 — End: ?

## 2015-02-02 MED ORDER — GABAPENTIN 300 MG PO CAPS
300.0000 mg | ORAL_CAPSULE | Freq: Three times a day (TID) | ORAL | Status: DC
Start: 2015-02-02 — End: 2015-02-06

## 2015-02-02 MED ORDER — HYDROXYZINE PAMOATE 25 MG PO CAPS
25.0000 mg | ORAL_CAPSULE | Freq: Three times a day (TID) | ORAL | Status: AC | PRN
Start: 2015-02-02 — End: ?

## 2015-02-02 MED ORDER — QUETIAPINE FUMARATE 50 MG PO TABS
50.0000 mg | ORAL_TABLET | Freq: Two times a day (BID) | ORAL | Status: DC
Start: 2015-02-02 — End: 2015-02-06

## 2015-02-02 NOTE — Progress Notes (Signed)
Physicians Discharge Summary has been routed to next level of care at [facility].

## 2015-02-02 NOTE — Plan of Care (Signed)
Problem: Health Promotion  Goal: Knowledge - health resources  Extent of understanding and conveyed about healthcare resources.   Outcome: Completed Date Met:  02/02/15  Patient states he will follow up with his care after discharge.

## 2015-02-02 NOTE — Plan of Care (Signed)
Problem: At Risk for Suicide / Harm to Self AS EVIDENCED BY...  Goal: Suicide Alert Level 1: to maintain patient safety during hospitalization  Outcome: Completed Date Met:  02/02/15  Patient remained on level 1 throughout admission.

## 2015-02-02 NOTE — Plan of Care (Signed)
Problem: At Risk for Suicide / Harm to Self AS EVIDENCED BY...  Goal: Reduction of suicidal ideation, and no attempts  Pt will verbalize having no active thoughts of suicide for two consecutive days by day 4.   Outcome: Completed Date Met:  02/02/15  Patient denies SI, HI and AVH.
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
## 2015-02-02 NOTE — Plan of Care (Signed)
Problem: Health Promotion  Goal: Knowledge - disease process  Extent of understanding conveyed about a specific disease process.   Outcome: Completed Date Met:  02/02/15  Patient states he understands his illness.

## 2015-02-02 NOTE — Discharge Summary (Signed)
Discharge [place] Details    Patient Name: Raymond Thornton,Raymond Thornton  Attending Physician: Chapman Fitch, MD  Primary Care Physician: Christa See, MD    Date of Admission: 01/29/2015  Date of Discharge: 02/02/2015  Length of Stay in the Hospital: 4    Reason for Admission:  Per the initial evaluation.  "I slipped off." Patient is a 34 year old Caucasian  male who brought himself to ER. According the ER notes, he  denied a plan to kill himself, but he reported he came before  things got too bad. At that time, patient reported he does not  feel safe to live in thecommunity and returned to his mobile  home where he is staying. He lives with his co-workers, who  travel most of the days of the year and reported that at time,  they do not understand him. He denied any homicidal ideations.  Patient was today seen at the [facility]. According to the  patient, he fell that he completely he slipped off and could not  control himself. Patient reports it is hard to explain the kind  of suicidal thoughts he was having, but he did not have any plan  at that time."    Diagnosis on Admission:     Major depressive disorder, severe, recurrent, with psychosis.    Diagnosis on Discharge:     Major depressive disorder, recurrent, with psychosis.  Discharge Medications:     Medication List      START taking these medications          hydrOXYzine 25 MG capsule           Anxiety   Commonly known as:  VISTARIL   Take 1 capsule (25 mg total) by mouth 3 (three) times daily as needed for Anxiety.         CHANGE how you take these medications          * gabapentin 100 MG capsule           Depression/Anxiety  (home med)   Commonly known as:  NEURONTIN   Take 3 capsules (300 mg total) by mouth 3 (three) times daily.   What changed:  Another medication with the same name was added. Make sure you understand how and when to take each.                       * QUEtiapine 200 MG tablet          MDD   Commonly known as:  SEROquel   Take 1 tablet (200 mg  total) by mouth nightly.   What changed:  Another medication with the same name was added. Make sure you understand how and when to take each.       * QUEtiapine 50 MG tablet               MDD   Commonly known as:  SEROquel   Take 1 tablet (50 mg total) by mouth 2 (two) times daily.   What changed:  You were already taking a medication with the same name, and this prescription was added. Make sure you understand how and when to take each.       * traZODone 50 MG tablet             Insomnia   Commonly known as:  DESYREL   Take 3 tablets (150 mg total) by mouth nightly.   What changed:  Another medication with the same name was  added. Make sure you understand how and when to take each.                 CONTINUE taking these medications          PARoxetine 10 MG tablet    MDD   Commonly known as:  PAXIL   Take 4 tablets (40 mg total) by mouth every morning.         Where to Get Your Medications     These are the prescriptions that you need to pick up.         You may get the following medications from any pharmacy   -  gabapentin 300 MG capsule   -  hydrOXYzine 25 MG capsule   -  QUEtiapine 50 MG tablet   -  traZODone 150 MG tablet                      Admission H&P summary: (See full History and Physical for details.)    Consultations:  Treatment Team: Attending Provider: Chapman Fitch, MD; Registered Nurse: Konrad Felix, RN; Registered Nurse: Monico Blitz, [facility] Course:     The patient was admitted for the reasons mentioned above on a voluntary admission. The medications were addressed as above. The patient was monitored for effectiveness and side effects.The patient benefited from the overall therapeutic milieu inclusive of individual and group therapy, medication management and unit programming.  He interacted well with his peers, attended most of the groups on the unit. Substance and Nicotine abuse education provided.     The patient today reports of feeling " very good,I am a lil nervous about  getting discharged, but I should be fine, my friend is coming to pick me up, I will be returning to work"    The patient denies any current depressive, manic, psychotic or anxiety related symptoms. The patient also denies any suicidal/Homicidal Thoughts intent or plan. Endorses positive plans for the future, improving insight and judgement.     During the treatment team meeting it was determined that the patient has met the treatment goals and criterias for the current inpatient psychiatric hospitalization and should continue to benefit form out patient follow up with his/her Physician as scheduled and/or recommended.        Two or more antipsychotic: Y or N. If yes, Please explain:      N/A    Discharge MSE:    General Appearance: Appears stated age, good eye contact, no psychomotor retardation, no psychomotor agitation, no Abnormal body movement noted and Appropriately Attired\  Speech:  Spontaneous, Clear, Coherent and Not pressured  Mood:  I am good  Affect:  Mood Congruent, Euthymic and Normal Range  Thought Process:  Linear, Goal Directed and Under Inclusive  Thought Content:  No preoccupation, No Delusion, No suicidal thought, intent, plan and No homicidal thought, intent, plan  Perceptual Disturbance:  No audio, tacile, olfactory, visual hallucination  Insight:  Fair  Judgment: Fair   Cognition:   Alert and Oriented to time, place and person    Discharge Condition: stable    Disposition/Follow Up:  home with family   [employer] [facility] Your case was closed 06/21/2013. To re-establish services, you will need to walk-in anytime between 8-3pm Monday thru Friday. Your information has been faxed to them and they will be expecting you. [facility]  [address], [place] [address]  [phone]  The patient was encouraged to remain adherent with out patient psychiatric follow up, current medications and medical follow up if needed. The patient understood and agreed with  the plan.     Time spent coordinating discharge and reviewing discharge plan: 35 mins.

## 2015-02-02 NOTE — Progress Notes (Signed)
Patient presents as bright and friendly.  Patient is awake and visible on the unit most of the time.  Patient is alert and oriented x 4.  Patient denies SI, HI and AVH.  Patient is social and interactive with peers.  Patient is polite, pleasant and cooperative with staff.  Patient is attending most groups and participating appropriately.  Patient states he is looking forward to being discharged today although he is anxious about it.  Patient states he will be following up with care when her returns to [place] in less than a week.  All 7a-7p scheduled and PRN medications given with no adverse reactions or side effects observed or reported.  No management problem.

## 2015-02-02 NOTE — Plan of Care (Signed)
Problem: At Risk for Suicide / Harm to Self AS EVIDENCED BY...  Goal: Completes discharge safety and recovery plan  Outcome: Completed Date Met:  02/02/15  Completed with the help of staff and returned.

## 2015-02-02 NOTE — Plan of Care (Signed)
Problem: Pain  Goal: Patient's pain/discomfort is manageable  Outcome: Adequate for Discharge  Patient was reporting back pain but was saying that it was being controlled with ibuprofen.

## 2015-02-02 NOTE — Progress Notes (Signed)
No vaccines given.  All belongings returned to patient.  Prescriptions discussed with and given to patient.  Patient declined return to work form.  Patient completed and returned survey.  [place] safety plan completed and returned.  Follow up appointment information discussed with and given to patient.  PHQ9 not completed on admission and form was lost.  Patient declined Smoking Cessation information.  Patient escorted off of unit to his mom who is his ride at [employer].

## 2015-02-02 NOTE — Plan of Care (Signed)
Problem: Loss of functioning (Thought Disorder, Mood Disturbance and/or Severe Anxiety) AS EVIDENCED BY...  Goal: Verbalizes reduced anxiety  Outcome: Completed Date Met:  02/02/15  Patient reports mild anxiety.

## 2015-02-02 NOTE — Plan of Care (Signed)
Problem: Psychosocial and Spiritual Needs  Goal: Demonstrates ability to cope with hospitalization/illness  Outcome: Completed Date Met:  02/02/15  Patient has not reported or displayed any problems coping with hospitalization.

## 2015-02-02 NOTE — Plan of Care (Signed)
Problem: At Risk for Suicide / Harm to Self AS EVIDENCED BY...  Goal: Verbalizes understanding of medication, benefits, and side effects  Outcome: Completed Date Met:  02/02/15  Patient states he understands his medications.

## 2015-02-02 NOTE — Progress Notes (Signed)
All 7pm - 7am medications given with no reported or observed side effects or adverse reactions including first doses given.

## 2015-02-02 NOTE — Plan of Care (Signed)
Problem: Loss of functioning (Thought Disorder, Mood Disturbance and/or Severe Anxiety) AS EVIDENCED BY...  Goal: Reports improved mood  Verbalize a 6 on a scale of 1-10 in improved mood by day 4.   Outcome: Completed Date Met:  02/02/15  Patient reports a much improved mood.

## 2015-02-02 NOTE — Plan of Care (Signed)
Problem: Safety  Goal: Patient will be free from injury during hospitalization  Outcome: Completed Date Met:  02/02/15  Patient did not report any injuries during hospitalization.

## 2015-02-02 NOTE — Plan of Care (Signed)
Problem: At Risk for Suicide / Harm to Self AS EVIDENCED BY...  Goal: Attends a minimum number of therapies daily  Pt will attend a minimum of 75 % of groups daily by day 5.   Outcome: Completed Date Met:  02/02/15  Patient is attending most groups and particpating appropriately.

## 2015-02-06 ENCOUNTER — Emergency Department: Payer: Self-pay

## 2015-02-06 ENCOUNTER — Observation Stay: Payer: Self-pay | Admitting: Internal Medicine

## 2015-02-06 ENCOUNTER — Observation Stay
Admission: EM | Admit: 2015-02-06 | Discharge: 2015-02-07 | Disposition: A | Discharge status: Home / Self Care | Payer: Self-pay | Source: Ambulatory Visit | Attending: Internal Medicine | Admitting: Internal Medicine

## 2015-02-06 DIAGNOSIS — Z79899 Other long term (current) drug therapy: Secondary | ICD-10-CM | POA: Insufficient documentation

## 2015-02-06 DIAGNOSIS — R079 Chest pain, unspecified: Principal | ICD-10-CM | POA: Diagnosis present

## 2015-02-06 DIAGNOSIS — M549 Dorsalgia, unspecified: Secondary | ICD-10-CM | POA: Insufficient documentation

## 2015-02-06 DIAGNOSIS — G8929 Other chronic pain: Secondary | ICD-10-CM | POA: Insufficient documentation

## 2015-02-06 DIAGNOSIS — R0602 Shortness of breath: Secondary | ICD-10-CM | POA: Insufficient documentation

## 2015-02-06 DIAGNOSIS — F329 Major depressive disorder, single episode, unspecified: Secondary | ICD-10-CM | POA: Insufficient documentation

## 2015-02-06 DIAGNOSIS — R55 Syncope and collapse: Secondary | ICD-10-CM

## 2015-02-06 DIAGNOSIS — F419 Anxiety disorder, unspecified: Secondary | ICD-10-CM | POA: Insufficient documentation

## 2015-02-06 LAB — CBC AND DIFFERENTIAL
Basophils %: 0.8 % (ref 0.0–3.0)
Basophils Absolute: 0.1 10*3/uL (ref 0.0–0.3)
Eosinophils %: 4.1 % (ref 0.0–7.0)
Eosinophils Absolute: 0.3 10*3/uL (ref 0.0–0.8)
Hematocrit: 39.7 % (ref 39.0–52.5)
Hemoglobin: 13.3 gm/dL (ref 13.0–17.5)
Lymphocytes Absolute: 1.7 10*3/uL (ref 0.6–5.1)
Lymphocytes: 24.7 % (ref 15.0–46.0)
MCH: 31 pg (ref 28–35)
MCHC: 34 gm/dL (ref 31–36)
MCV: 91 fL (ref 80–100)
MPV: 7.8 fL (ref 6.0–10.0)
Monocytes Absolute: 0.7 10*3/uL (ref 0.1–1.7)
Monocytes: 10 % (ref 3.0–15.0)
Neutrophils %: 60.4 % (ref 42.0–78.0)
Neutrophils Absolute: 4.2 10*3/uL (ref 1.7–8.6)
PLT CT: 249 10*3/uL (ref 130–440)
RBC: 4.36 10*6/uL (ref 4.00–5.70)
RDW: 11.9 % (ref 10.5–14.5)
WBC: 7 10*3/uL (ref 4.00–11.00)

## 2015-02-06 LAB — COMPREHENSIVE METABOLIC PANEL
ALT: 24 U/L (ref 0–55)
AST (SGOT): 15 U/L (ref 10–42)
Albumin/Globulin Ratio: 1.27 Ratio (ref 0.70–1.50)
Albumin: 3.6 gm/dL (ref 3.5–5.0)
Alkaline Phosphatase: 112 U/L (ref 40–145)
Anion Gap: 13.2 mMol/L (ref 7.0–18.0)
BUN / Creatinine Ratio: 12.6 Ratio (ref 10.0–30.0)
BUN: 20 mg/dL (ref 7–22)
Bilirubin, Total: 0.8 mg/dL (ref 0.1–1.2)
CO2: 24.3 mMol/L (ref 20.0–30.0)
Calcium: 9.1 mg/dL (ref 8.5–10.5)
Chloride: 106 mMol/L (ref 98–110)
Creatinine: 1.59 mg/dL — ABNORMAL HIGH (ref 0.80–1.30)
EGFR: 50 mL/min/{1.73_m2}
Globulin: 2.9 gm/dL (ref 2.0–4.0)
Glucose: 105 mg/dL — ABNORMAL HIGH (ref 70–99)
Osmolality Calc: 283 mOsm/kg (ref 275–300)
Potassium: 3.6 mMol/L (ref 3.5–5.3)
Protein, Total: 6.5 gm/dL (ref 6.0–8.3)
Sodium: 140 mMol/L (ref 136–147)

## 2015-02-06 LAB — TROPONIN I: Troponin I: 0.01 ng/mL (ref 0.00–0.02)

## 2015-02-06 MED ORDER — SODIUM CHLORIDE 0.9 % IV BOLUS
1000.0000 mL | Freq: Once | INTRAVENOUS | Status: AC
Start: 2015-02-06 — End: 2015-02-06
  Administered 2015-02-06: 1000 mL via INTRAVENOUS

## 2015-02-06 MED ORDER — ASPIRIN 81 MG PO CHEW
CHEWABLE_TABLET | ORAL | Status: AC
Start: 2015-02-06 — End: ?
  Filled 2015-02-06: qty 4

## 2015-02-06 MED ORDER — ONDANSETRON HCL 4 MG/2ML IJ SOLN
4.0000 mg | Freq: Once | INTRAMUSCULAR | Status: AC
Start: 2015-02-06 — End: 2015-02-06
  Administered 2015-02-06: 4 mg via INTRAVENOUS

## 2015-02-06 MED ORDER — IOPAMIDOL 76 % IV SOLN
100.0000 mL | Freq: Once | INTRAVENOUS | Status: DC
Start: 2015-02-06 — End: 2015-02-07

## 2015-02-06 MED ORDER — ONDANSETRON HCL 4 MG/2ML IJ SOLN
INTRAMUSCULAR | Status: AC
Start: 2015-02-06 — End: ?
  Filled 2015-02-06: qty 2
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
## 2015-02-06 NOTE — ED Notes (Signed)
See above

## 2015-02-06 NOTE — ED Notes (Signed)
PA at bedside.

## 2015-02-07 LAB — ECG 12-LEAD
P Wave Axis: 12 deg
P Wave Axis: 18 deg
P-R Interval: 151 ms
P-R Interval: 172 ms
Patient Age: 34 years
Patient Age: 34 years
Q-T Interval(Corrected): 439 ms
Q-T Interval(Corrected): 434 ms
Q-T Interval: 351 ms
Q-T Interval: 391 ms
QRS Axis: -40 deg
QRS Axis: -32 deg
QRS Duration: 100 ms
QRS Duration: 108 ms
T Axis: 1 years
T Axis: -11 years
Ventricular Rate: 94 //min
Ventricular Rate: 74 //min

## 2015-02-07 LAB — BASIC METABOLIC PANEL
Anion Gap: 11.6 mMol/L (ref 7.0–18.0)
BUN / Creatinine Ratio: 17.7 Ratio (ref 10.0–30.0)
BUN: 17 mg/dL (ref 7–22)
CO2: 22.6 mMol/L (ref 20.0–30.0)
Calcium: 8.4 mg/dL — ABNORMAL LOW (ref 8.5–10.5)
Chloride: 109 mMol/L (ref 98–110)
Creatinine: 0.96 mg/dL (ref 0.80–1.30)
EGFR: >60 mL/min/{1.73_m2}
Glucose: 95 mg/dL (ref 70–99)
Osmolality Calc: 280 mOsm/kg (ref 275–300)
Potassium: 3.7 mMol/L (ref 3.5–5.3)
Sodium: 140 mMol/L (ref 136–147)

## 2015-02-07 LAB — TROPONIN I
Troponin I: 0.01 ng/mL (ref 0.00–0.02)
Troponin I: 0.01 ng/mL (ref 0.00–0.02)

## 2015-02-07 MED ORDER — HYDROXYZINE PAMOATE 25 MG PO CAPS
25.0000 mg | ORAL_CAPSULE | Freq: Three times a day (TID) | ORAL | Status: DC | PRN
Start: 2015-02-07 — End: 2015-02-07

## 2015-02-07 MED ORDER — PAROXETINE HCL 20 MG PO TABS
40.0000 mg | ORAL_TABLET | Freq: Every morning | ORAL | Status: DC
Start: 2015-02-07 — End: 2015-02-07
  Filled 2015-02-07 (×2): qty 2

## 2015-02-07 MED ORDER — QUETIAPINE FUMARATE 100 MG PO TABS
200.0000 mg | ORAL_TABLET | Freq: Every evening | ORAL | Status: DC
Start: 2015-02-07 — End: 2015-02-07

## 2015-02-07 MED ORDER — ACETAMINOPHEN 650 MG RE SUPP
650.0000 mg | RECTAL | Status: DC | PRN
Start: 2015-02-07 — End: 2015-02-07

## 2015-02-07 MED ORDER — GABAPENTIN 300 MG PO CAPS
300.0000 mg | ORAL_CAPSULE | Freq: Three times a day (TID) | ORAL | Status: DC
Start: 2015-02-07 — End: 2015-02-07
  Administered 2015-02-07: 300 mg via ORAL
  Filled 2015-02-07 (×6): qty 1

## 2015-02-07 MED ORDER — ACETAMINOPHEN 160 MG/5ML PO SOLN
650.0000 mg | ORAL | Status: DC | PRN
Start: 2015-02-07 — End: 2015-02-07

## 2015-02-07 MED ORDER — SODIUM CHLORIDE 0.9 % IV SOLN
INTRAVENOUS | Status: DC
Start: 2015-02-07 — End: 2015-02-07

## 2015-02-07 MED ORDER — TRAZODONE HCL 50 MG PO TABS
150.0000 mg | ORAL_TABLET | Freq: Every evening | ORAL | Status: DC
Start: 2015-02-07 — End: 2015-02-07
  Filled 2015-02-07: qty 3

## 2015-02-07 MED ORDER — ACETAMINOPHEN 325 MG PO TABS
650.0000 mg | ORAL_TABLET | ORAL | Status: DC | PRN
Start: 2015-02-07 — End: 2015-02-07
  Administered 2015-02-07: 650 mg via ORAL
  Filled 2015-02-07: qty 2

## 2015-02-07 MED ORDER — QUETIAPINE FUMARATE 100 MG PO TABS
200.0000 mg | ORAL_TABLET | Freq: Every evening | ORAL | Status: DC
Start: 2015-02-07 — End: 2015-02-07
  Filled 2015-02-07: qty 2

## 2015-02-07 NOTE — Plan of Care (Signed)
Problem: Health Promotion  Goal: Vaccination Screening  All patients will be screened for current vaccination status on each admission.   Outcome: Completed Date Met:  02/07/15  Goal: Risk control - tobacco abuse  Actions to eliminate or reduce tobacco use.   Outcome: Completed Date Met:  02/07/15
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
## 2015-02-07 NOTE — ED Notes (Addendum)
Pt resting with eyes closed and lights dimmed. Pt aware of admission for observation.

## 2015-02-07 NOTE — Discharge Summary (Signed)
DISCHARGE SUMMARY    Raymond Thornton, 34 y.o., DOB [birth year], MRN [id number].  Admission date: 02/06/2015  Discharge Date 02/07/2015        Hospital Course :  Raymond Thornton is a 34 y.o. male with a history of depression who is a resident of [facility] and is here working for the fair and was placed on observation status through the emergency room to the medical floor for further evaluation of chest discomfort. 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 The patient became weak and dizzy and notes that he felt as though he was unable to stand. The patient sat down and began noticing numbness in the right arm. The patient states that his symptoms were associated with shortness of breath and nausea but no diaphoresis. 911 was called and patient was brought to the emergency room. The patient notes that he was still having discomfort when he arrived to the emergency room. The patient states that over the time he was in the hospital emergency room, his symptoms have resolved, and initial workup to include EKG, CT scan of the chest and cardiac enzymes have all been negative. Because of the patient's presentation, he was felt to be appropriate for placement on observation status. Over the course of hospitalization, the patient had no further symptoms. Multiple troponin levels were drawn which were all within normal limits of 0.01. A repeat EKG on the morning of the fourth demonstrates no evidence to suggest acute ischemia. The patient did have a mildly elevated creatinine on admission of 1.59. It was felt the patient may have been mildly volume depleted, and the patient was given IV fluids. Follow-up creatinine is normal at 0.96. By the morning of the fourth, the patient states that he feels fine. As the patient's chest discomfort was nonexertional, and workup has been reassuring, it  appears that the patient is not suffering an acute coronary syndrome. Therefore, the patient is being discharged, and is leaving the area with the fair today, but has been encouraged to follow-up with his primary care doctor when he returns [place].    Physical Exam at the time of discharge:     General: AAO x 3, NAD  CVS: RRR, S1, S2 normal  Resp: CTA B/L, no wheezing  Abd: soft, Non-tender, non-distended, audible bowel sounds  Neuro: CN II-XII intact, no gross focal deficit    Discharge Diagnosis       1. Chest pain -- the patient has had no further discomfort overnight, and on rounds this morning is feeling well. As noted, the discomfort was at rest and not with exertion. The patient has had 3 troponin levels which have all been completely normal at 0.01, and repeat EKG shows no evidence of acute ischemia. The patient is clearly not having an unstable coronary syndrome, and is felt the patient can be discharged. The patient has been instructed to follow-up with his primary care doctor as soon as possible.    2. Renal -- as noted, the patient had an elevated creatinine of 1.59 on admission which has normalized at 0.96 with IV fluids. The patient may have been mildly volume depleted at the time of admission.    3. Depression -- the patient has a history of significant depression and is on multiple medications which will be continued.      Discharge Medications  Medication List      CHANGE how you take these medications          QUEtiapine 200 MG tablet   Commonly known as:  SEROquel   Take 1 tablet (200 mg total) by mouth nightly.   What changed:  Another medication with the same name was removed. Continue taking this medication, and follow the directions you see here.         CONTINUE taking these medications          gabapentin 100 MG capsule   Commonly known as:  NEURONTIN   Take 3 capsules (300 mg total) by mouth 3 (three) times daily.       hydrOXYzine 25 MG capsule   Commonly known as:  VISTARIL    Take 1 capsule (25 mg total) by mouth 3 (three) times daily as needed for Anxiety.       PARoxetine 40 MG tablet   Commonly known as:  PAXIL       traZODone 150 MG tablet   Commonly known as:  DESYREL   Take 1 tablet (150 mg total) by mouth nightly.             Discharge Instructions       Diet: Regular Diet    Activity: Activity: As tolerated    Follow up: Pcp, Noneorunknown, MD      Follow up with PCP at home ASAP      Condition on dischage: Stable    Prognosis: Guarded 2nd to underlying medical conditions.     Total Time in preparing paper work, data evaluation and todays exam - 35 minutes    Signed:  Sim Boast  02/07/2015  7:56 AM

## 2015-02-07 NOTE — Progress Notes (Signed)
Patient on telemetry number 5 and in normal sinus rhythm and his heart rate is in the 80s. Patient denies shortness ofb breath, chest pain, or anxiety at this time. Will continue to monitor. Offered a snack and patient given a boxed meal.

## 2015-02-07 NOTE — Discharge Instructions (Signed)
Diet: As tolerated      Activities:  As tolerated      Notify Physician:  Shortness of breath and Chest pain      Wound Care:  None

## 2015-02-07 NOTE — ED Notes (Signed)
[facility] #[id number]

## 2015-03-11 IMAGING — CR DG HAND COMPLETE 3+V*L*
3 series · 3 of 3 positions shown · non-contrast
Comparison: 12/20/2012

CLINICAL DATA: Fall. Hand and thumb injury and pain.

EXAM:
LEFT HAND - COMPLETE 3+ VIEW

[x hand pa left]
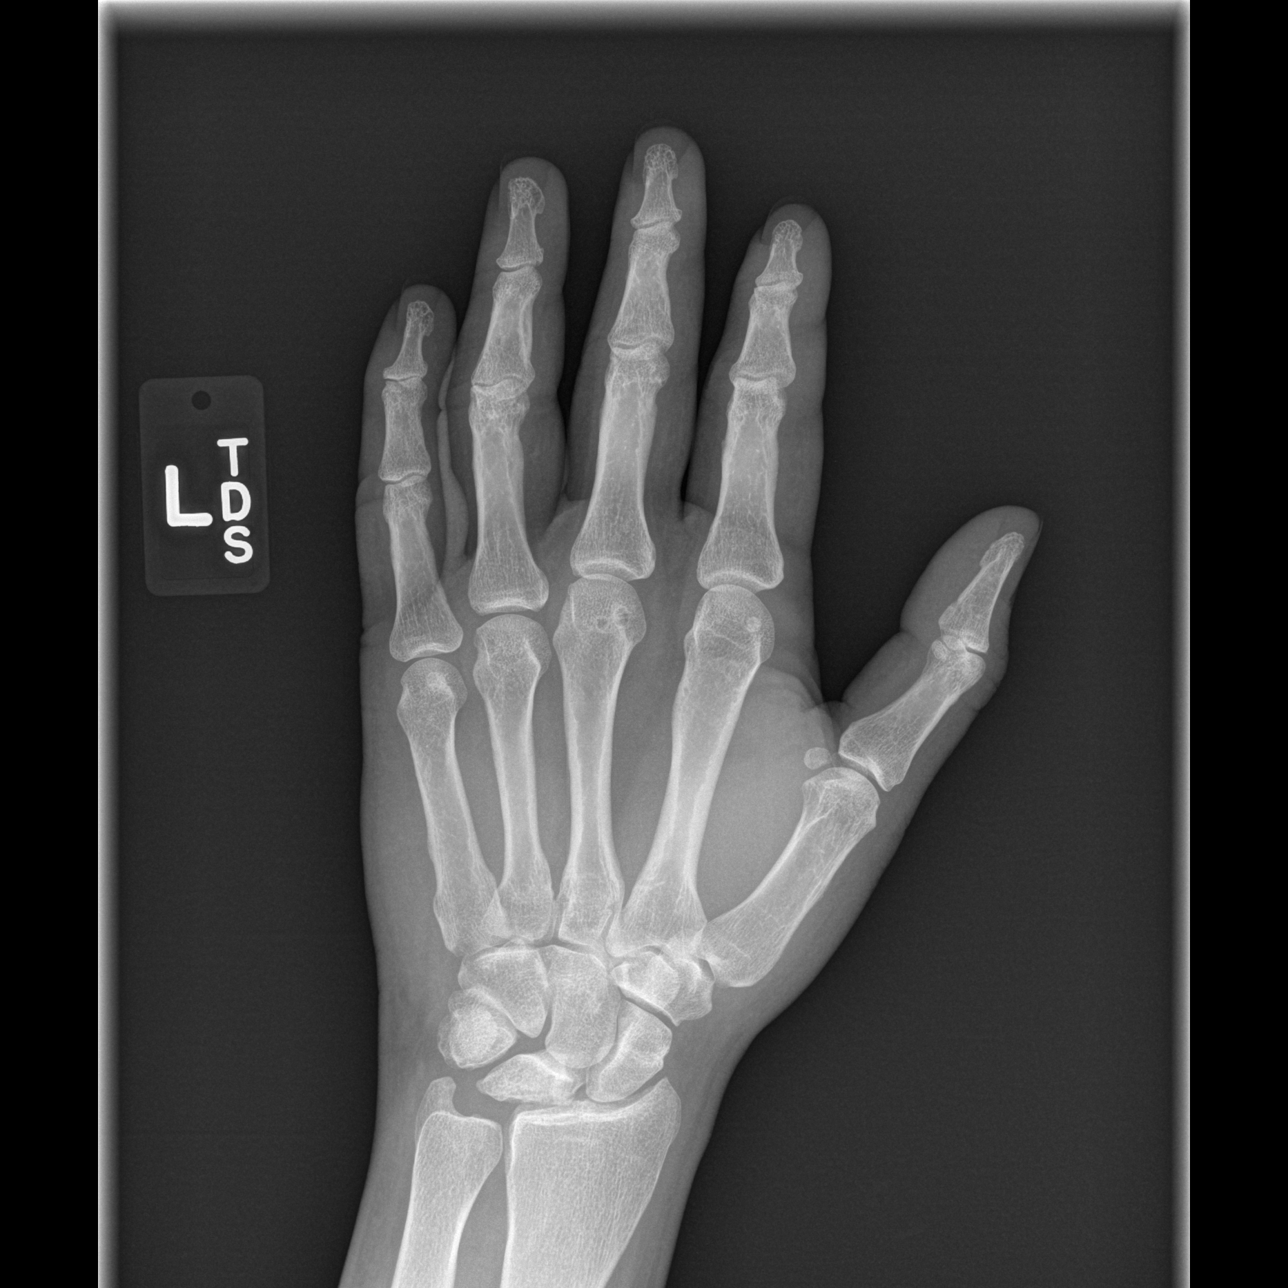

[x hand oblique left]
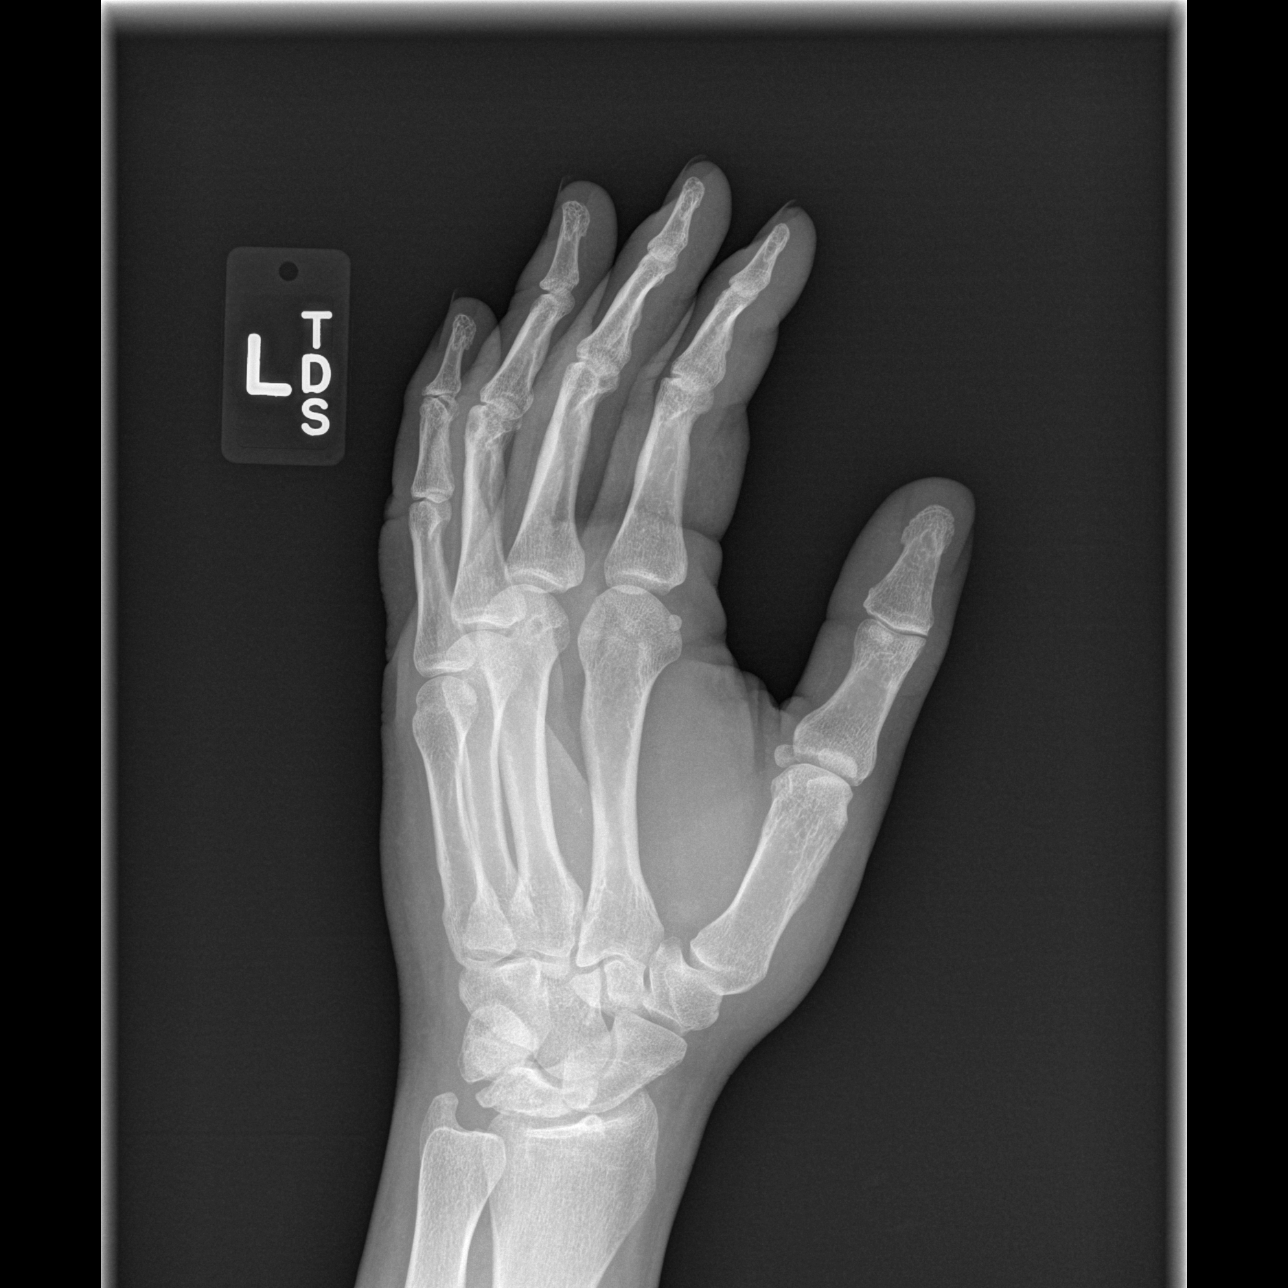

[x hand lat left]
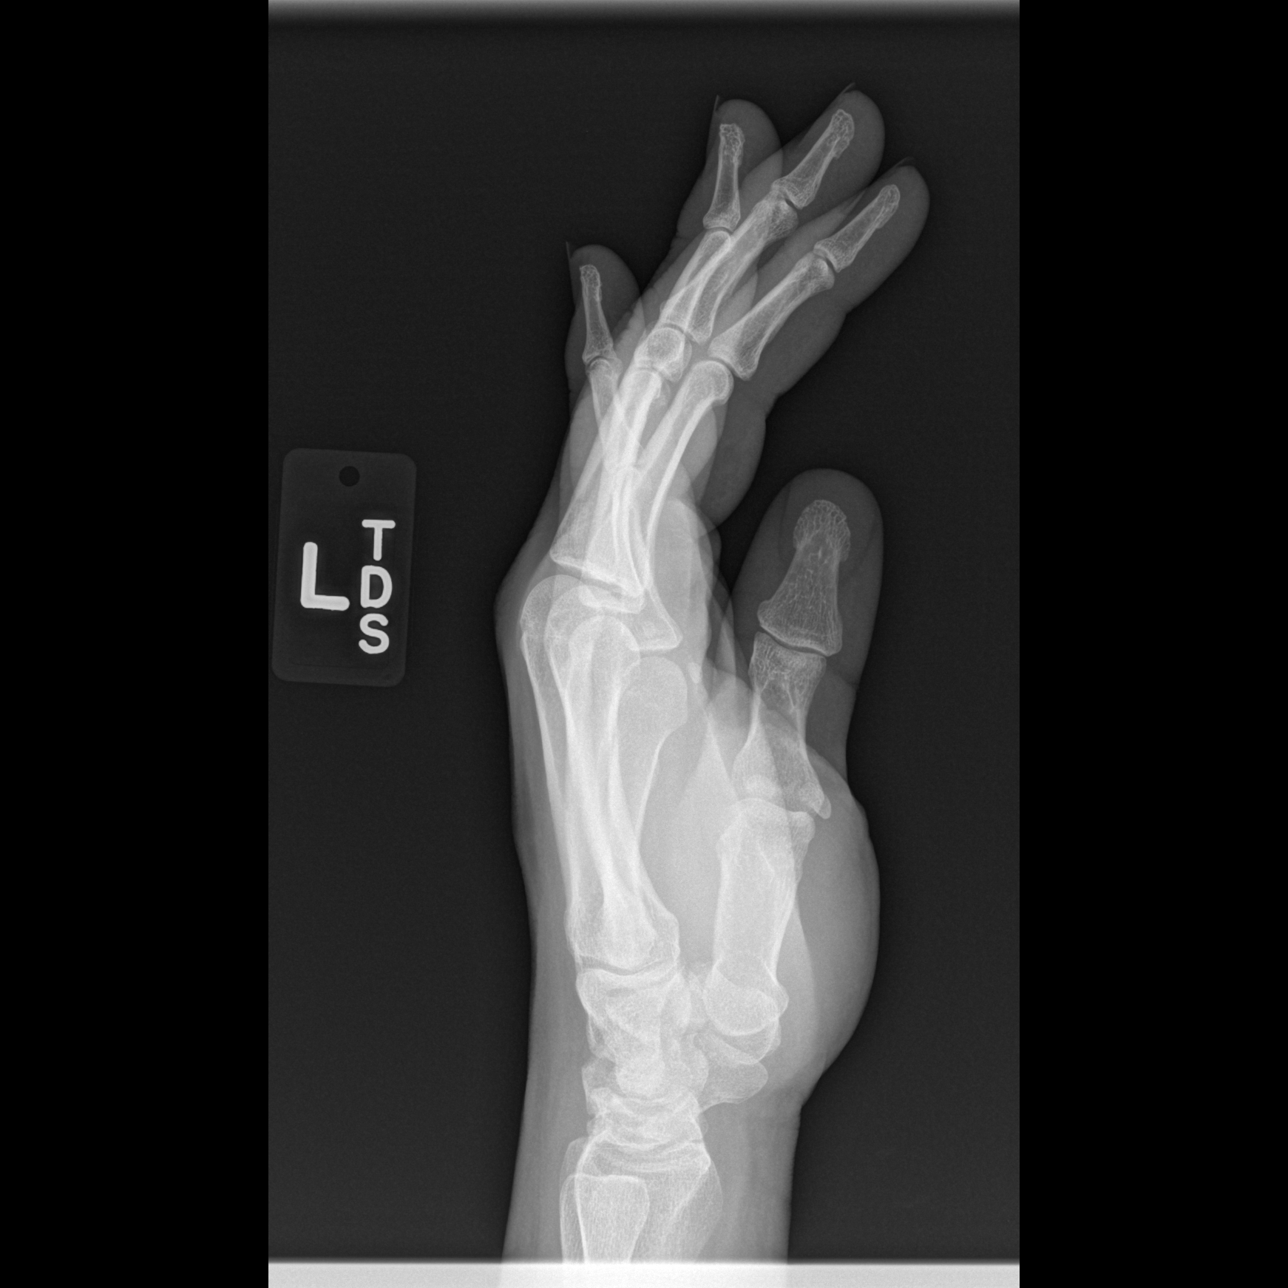

[3 of 3 positions shown; findings below may reference images not displayed]

FINDINGS: There is no evidence of fracture or dislocation. Chronic flattening
of the articular surface of the lunate is again seen likely due to
avascular necrosis, without other signs of arthropathy. Soft tissues
are unremarkable
IMPRESSION: No acute findings.
